# Patient Record
Sex: Female | Born: 1955 | Race: White | Hispanic: No | Marital: Married | Smoking: Former smoker
Health system: Southern US, Community
[De-identification: ages and names within clinical notes are randomized; demographics above are authoritative.]

## PROBLEM LIST (undated history)

## (undated) DIAGNOSIS — C3490 Malignant neoplasm of unspecified part of unspecified bronchus or lung: Secondary | ICD-10-CM

## (undated) DIAGNOSIS — J449 Chronic obstructive pulmonary disease, unspecified: Secondary | ICD-10-CM

## (undated) DIAGNOSIS — J910 Malignant pleural effusion: Secondary | ICD-10-CM

## (undated) DIAGNOSIS — J45909 Unspecified asthma, uncomplicated: Secondary | ICD-10-CM

## (undated) DIAGNOSIS — Z923 Personal history of irradiation: Secondary | ICD-10-CM

## (undated) DIAGNOSIS — F988 Other specified behavioral and emotional disorders with onset usually occurring in childhood and adolescence: Secondary | ICD-10-CM

## (undated) DIAGNOSIS — J189 Pneumonia, unspecified organism: Secondary | ICD-10-CM

## (undated) DIAGNOSIS — M1990 Unspecified osteoarthritis, unspecified site: Secondary | ICD-10-CM

## (undated) HISTORY — DX: Chronic obstructive pulmonary disease, unspecified: J44.9

## (undated) HISTORY — DX: Personal history of irradiation: Z92.3

## (undated) HISTORY — DX: Other specified behavioral and emotional disorders with onset usually occurring in childhood and adolescence: F98.8

## (undated) HISTORY — PX: SHOULDER ARTHROSCOPY: SHX128

## (undated) HISTORY — PX: BREAST SURGERY: SHX581

## (undated) HISTORY — PX: TOTAL ABDOMINAL HYSTERECTOMY: SHX209

## (undated) HISTORY — DX: Malignant neoplasm of unspecified part of unspecified bronchus or lung: C34.90

---

## 1998-10-22 ENCOUNTER — Ambulatory Visit (HOSPITAL_COMMUNITY): Admission: RE | Admit: 1998-10-22 | Discharge: 1998-10-22 | Payer: Self-pay | Admitting: Family Medicine

## 1999-01-27 ENCOUNTER — Encounter: Admission: RE | Admit: 1999-01-27 | Discharge: 1999-04-27 | Payer: Self-pay | Admitting: Critical Care Medicine

## 1999-05-25 ENCOUNTER — Encounter: Admission: RE | Admit: 1999-05-25 | Discharge: 1999-05-25 | Payer: Self-pay | Admitting: Family Medicine

## 1999-05-25 ENCOUNTER — Encounter: Payer: Self-pay | Admitting: Family Medicine

## 1999-11-09 ENCOUNTER — Other Ambulatory Visit: Admission: RE | Admit: 1999-11-09 | Discharge: 1999-11-09 | Payer: Self-pay | Admitting: Radiology

## 2000-01-07 ENCOUNTER — Encounter: Payer: Self-pay | Admitting: Family Medicine

## 2000-01-07 ENCOUNTER — Encounter: Admission: RE | Admit: 2000-01-07 | Discharge: 2000-01-07 | Payer: Self-pay | Admitting: Family Medicine

## 2001-04-14 ENCOUNTER — Encounter: Admission: RE | Admit: 2001-04-14 | Discharge: 2001-04-14 | Payer: Self-pay | Admitting: Family Medicine

## 2001-04-14 ENCOUNTER — Encounter: Payer: Self-pay | Admitting: Family Medicine

## 2001-06-07 HISTORY — PX: OTHER SURGICAL HISTORY: SHX169

## 2001-07-07 ENCOUNTER — Encounter: Payer: Self-pay | Admitting: Family Medicine

## 2001-07-07 ENCOUNTER — Encounter: Admission: RE | Admit: 2001-07-07 | Discharge: 2001-07-07 | Payer: Self-pay | Admitting: Family Medicine

## 2002-03-19 ENCOUNTER — Encounter: Payer: Self-pay | Admitting: General Surgery

## 2002-03-19 ENCOUNTER — Encounter: Admission: RE | Admit: 2002-03-19 | Discharge: 2002-03-19 | Payer: Self-pay | Admitting: General Surgery

## 2003-02-25 ENCOUNTER — Encounter: Payer: Self-pay | Admitting: Family Medicine

## 2003-02-25 ENCOUNTER — Ambulatory Visit (HOSPITAL_COMMUNITY): Admission: RE | Admit: 2003-02-25 | Discharge: 2003-02-25 | Payer: Self-pay | Admitting: Family Medicine

## 2003-02-26 ENCOUNTER — Other Ambulatory Visit: Admission: RE | Admit: 2003-02-26 | Discharge: 2003-02-26 | Payer: Self-pay | Admitting: Family Medicine

## 2003-03-07 ENCOUNTER — Ambulatory Visit (HOSPITAL_COMMUNITY): Admission: RE | Admit: 2003-03-07 | Discharge: 2003-03-07 | Payer: Self-pay | Admitting: Family Medicine

## 2003-03-07 ENCOUNTER — Encounter: Payer: Self-pay | Admitting: Family Medicine

## 2003-11-11 ENCOUNTER — Encounter: Admission: RE | Admit: 2003-11-11 | Discharge: 2003-11-11 | Payer: Self-pay | Admitting: Family Medicine

## 2012-07-19 ENCOUNTER — Ambulatory Visit (INDEPENDENT_AMBULATORY_CARE_PROVIDER_SITE_OTHER): Payer: BC Managed Care – PPO | Admitting: Internal Medicine

## 2012-07-19 ENCOUNTER — Encounter: Payer: Self-pay | Admitting: Internal Medicine

## 2012-07-19 ENCOUNTER — Ambulatory Visit (INDEPENDENT_AMBULATORY_CARE_PROVIDER_SITE_OTHER)
Admission: RE | Admit: 2012-07-19 | Discharge: 2012-07-19 | Disposition: A | Discharge status: Home / Self Care | Payer: Self-pay | Source: Ambulatory Visit | Attending: Internal Medicine | Admitting: Internal Medicine

## 2012-07-19 VITALS — BP 130/72 | HR 95 | Temp 97.4°F | Ht 63.0 in | Wt 135.0 lb

## 2012-07-19 DIAGNOSIS — R042 Hemoptysis: Secondary | ICD-10-CM

## 2012-07-19 DIAGNOSIS — R222 Localized swelling, mass and lump, trunk: Secondary | ICD-10-CM

## 2012-07-19 DIAGNOSIS — J441 Chronic obstructive pulmonary disease with (acute) exacerbation: Secondary | ICD-10-CM | POA: Insufficient documentation

## 2012-07-19 DIAGNOSIS — R918 Other nonspecific abnormal finding of lung field: Secondary | ICD-10-CM | POA: Insufficient documentation

## 2012-07-19 NOTE — Patient Instructions (Addendum)
Please remember to go to the  x-ray department downstairs for your tests - we will call you with the results when they are available.     Stop aspirin x 3 days if bleeding recurs and if persists  Or there are any abnormalities on chest xray  we'll need to schedule you for outpatient bronchosocopy at [facility]

## 2012-07-19 NOTE — Progress Notes (Signed)
  Subjective:    Patient ID: Linda Donovan, female    DOB: 1956/06/07  MRN: [id number]  HPI  Primary Linda Donovan  57 yowf quit smoking 1998 with sob that improved to point were stopped all medications x rare  saba  self referred 07/19/2012 to pulmonary clinic for hemoptysis.   07/19/2012 1st pulmonary eval in EMR era/ [facility] cc  Sob with inclines and cold weather and steps and no increase in saba indolent onset rattling cough and throat congestion onset mid Jan 2014 with white phlegm streaked with blood and sev times a week esp in am's and no epistaxis. No more blood x 5 days, ? Back of throat.   No obvious pattern to  variabilty or assoc sorethroat cp or chest tightness, subjective wheeze overt sinus or hb symptoms. No unusual exp hx or h/o childhood pna/ asthma or premature birth to his knowledge.    Review of Systems  Constitutional: Negative for fever and unexpected weight change.  HENT: Positive for congestion and postnasal drip. Negative for ear pain, nosebleeds, sore throat, rhinorrhea, sneezing, trouble swallowing, dental problem and sinus pressure.   Eyes: Negative for redness and itching.  Respiratory: Positive for cough and shortness of breath. Negative for chest tightness and wheezing.   Cardiovascular: Negative for palpitations and leg swelling.  Gastrointestinal: Negative for nausea and vomiting.  Genitourinary: Negative for dysuria.  Musculoskeletal: Negative for joint swelling.  Skin: Negative for rash.  Neurological: Negative for headaches.  Hematological: Does not bruise/bleed easily.  Psychiatric/Behavioral: Negative for dysphoric mood. The patient is not nervous/anxious.        Objective:   Physical Exam Wt Readings from Last 3 Encounters:  07/19/12 135 lb (61.236 kg)    HEENT mild turbinate edema.  Oropharynx no thrush or excess pnd or cobblestoning.  No JVD or cervical adenopathy. Mild accessory muscle hypertrophy. Trachea midline, nl thryroid. Chest was  hyperinflated by percussion with diminished breath sounds and moderate increased exp time without wheeze. Hoover sign positive at mid inspiration. Regular rate and rhythm without murmur gallop or rub or increase P2 or edema.  Abd: no hsm, nl excursion. Ext warm without cyanosis or clubbing.    CXR  07/19/2012 : Moderate to severe COPD.  7.7 cm right upper lobe mass compatible with primary lung cancer.  Nodular density over left upper lobe could represent a calcified  granuloma or sclerotic area within the left second rib.         Assessment & Plan:

## 2012-07-19 NOTE — Assessment & Plan Note (Signed)
Assoc with low grade hemoptysis suggesting may have airway involvement  Discussed in detail all the  indications, usual  risks and alternatives  relative to the benefits with patient who agrees to proceed with bronchoscopy with biopsy when weather permits

## 2012-07-19 NOTE — Assessment & Plan Note (Signed)
-   07/19/2012 spirometry FEV1  0.89 (37%) ratio 38  GOLD III relatively well compensated on present rx

## 2012-07-24 ENCOUNTER — Encounter (HOSPITAL_COMMUNITY): Payer: Self-pay

## 2012-07-25 ENCOUNTER — Ambulatory Visit (HOSPITAL_COMMUNITY): Payer: BC Managed Care – PPO

## 2012-07-25 ENCOUNTER — Ambulatory Visit (HOSPITAL_COMMUNITY): Admit: 2012-07-25 | Payer: Self-pay | Admitting: Internal Medicine

## 2012-07-25 ENCOUNTER — Ambulatory Visit (HOSPITAL_COMMUNITY)
Admission: RE | Admit: 2012-07-25 | Discharge: 2012-07-25 | Disposition: A | Discharge status: Home / Self Care | Payer: BC Managed Care – PPO | Source: Ambulatory Visit | Attending: Internal Medicine | Admitting: Internal Medicine

## 2012-07-25 ENCOUNTER — Encounter (HOSPITAL_COMMUNITY): Payer: Self-pay

## 2012-07-25 ENCOUNTER — Encounter (HOSPITAL_COMMUNITY)
Admission: RE | Disposition: A | Discharge status: Home / Self Care | Payer: Self-pay | Source: Ambulatory Visit | Attending: Internal Medicine

## 2012-07-25 ENCOUNTER — Encounter (HOSPITAL_COMMUNITY): Payer: Self-pay | Admitting: Radiology

## 2012-07-25 DIAGNOSIS — R222 Localized swelling, mass and lump, trunk: Secondary | ICD-10-CM

## 2012-07-25 DIAGNOSIS — J4489 Other specified chronic obstructive pulmonary disease: Secondary | ICD-10-CM | POA: Insufficient documentation

## 2012-07-25 DIAGNOSIS — R918 Other nonspecific abnormal finding of lung field: Secondary | ICD-10-CM

## 2012-07-25 DIAGNOSIS — J449 Chronic obstructive pulmonary disease, unspecified: Secondary | ICD-10-CM | POA: Insufficient documentation

## 2012-07-25 DIAGNOSIS — R042 Hemoptysis: Secondary | ICD-10-CM | POA: Insufficient documentation

## 2012-07-25 DIAGNOSIS — R0602 Shortness of breath: Secondary | ICD-10-CM | POA: Insufficient documentation

## 2012-07-25 HISTORY — PX: VIDEO BRONCHOSCOPY: SHX5072

## 2012-07-25 SURGERY — BRONCHOSCOPY, WITH FLUOROSCOPY
Anesthesia: Moderate Sedation | Laterality: Bilateral

## 2012-07-25 MED ORDER — LIDOCAINE HCL 2 % EX GEL
CUTANEOUS | Status: DC | PRN
  Administered 2012-07-25: 2

## 2012-07-25 MED ORDER — MEPERIDINE HCL 25 MG/ML IJ SOLN
INTRAMUSCULAR | Status: DC | PRN
  Administered 2012-07-25 (×2): 25 mg via INTRAVENOUS

## 2012-07-25 MED ORDER — MEPERIDINE HCL 100 MG/ML IJ SOLN
INTRAMUSCULAR | Status: AC
  Filled 2012-07-25: qty 2

## 2012-07-25 MED ORDER — PHENYLEPHRINE HCL 0.25 % NA SOLN
NASAL | Status: DC | PRN
  Administered 2012-07-25: 2 via NASAL

## 2012-07-25 MED ORDER — MIDAZOLAM HCL 10 MG/2ML IJ SOLN
INTRAMUSCULAR | Status: DC | PRN
  Administered 2012-07-25 (×2): 2.5 mg via INTRAVENOUS

## 2012-07-25 MED ORDER — LIDOCAINE HCL 1 % IJ SOLN
INTRAMUSCULAR | Status: DC | PRN
  Administered 2012-07-25: 6 mL via RESPIRATORY_TRACT

## 2012-07-25 MED ORDER — MIDAZOLAM HCL 10 MG/2ML IJ SOLN
INTRAMUSCULAR | Status: AC
  Filled 2012-07-25: qty 4

## 2012-07-25 NOTE — H&P (Signed)
Primary Linda Donovan   57 yowf quit smoking 1998 with sob that improved to point were stopped all medications x rare  saba  self referred 07/19/2012 to pulmonary clinic for hemoptysis.     07/19/2012 1st pulmonary eval in EMR era/ [facility] cc  Sob with inclines and cold weather and steps and no increase in saba indolent onset rattling cough and throat congestion onset mid Jan 2014 with white phlegm streaked with blood and sev times a week esp in am's and no epistaxis. No more blood x 5 days, ? Back of throat. cxr showed RUL post mass > rec fob   07/25/2012 FOB/ no new complaints.    No obvious pattern to  variabilty or assoc sorethroat cp or chest tightness, subjective wheeze overt sinus or hb symptoms. No unusual exp hx or h/o childhood pna/ asthma or premature birth to his knowledge.      Review of Systems  Constitutional: Negative for fever and unexpected weight change.  HENT: Positive for congestion and postnasal drip. Negative for ear pain, nosebleeds, sore throat, rhinorrhea, sneezing, trouble swallowing, dental problem and sinus pressure.   Eyes: Negative for redness and itching.  Respiratory: Positive for cough and shortness of breath. Negative for chest tightness and wheezing.   Cardiovascular: Negative for palpitations and leg swelling.  Gastrointestinal: Negative for nausea and vomiting.  Genitourinary: Negative for dysuria.  Musculoskeletal: Negative for joint swelling.  Skin: Negative for rash.  Neurological: Negative for headaches.  Hematological: Does not bruise/bleed easily.  Psychiatric/Behavioral: Negative for dysphoric mood. The patient is not nervous/anxious.          Objective:     Physical Exam Wt Readings from Last 3 Encounters:   07/19/12  135 lb (61.236 kg)       HEENT mild turbinate edema.  Oropharynx no thrush or excess pnd or cobblestoning.  No JVD or cervical adenopathy. Mild accessory muscle hypertrophy. Trachea midline, nl thryroid. Chest was  hyperinflated by percussion with diminished breath sounds and moderate increased exp time without wheeze. Hoover sign positive at mid inspiration. Regular rate and rhythm without murmur gallop or rub or increase P2 or edema.  Abd: no hsm, nl excursion. Ext warm without cyanosis or clubbing.     CXR  07/19/2012 : Moderate to severe COPD.   7.7 cm right upper lobe mass compatible with primary lung cancer.   Nodular density over left upper lobe could represent a calcified   granuloma or sclerotic area within the left second rib.             Assessment & Plan:                 Revision History       Date/Time User Action    > 07/19/2012  2:07 PM Nyoka Cowden, MD Signed      07/19/2012  9:00 AM Darrell Jewel, CMA Sign at close encounter              COPD  GOLD III - Nyoka Cowden, MD at 07/19/2012  1:57 PM    Status: Written Related Problem: COPD  GOLD III           - 07/19/2012 spirometry FEV1  0.89 (37%) ratio 38   GOLD III relatively well compensated on present rx         Right lower lobe lung mass - Nyoka Cowden, MD at 07/19/2012  2:00 PM    Status: Written Related Problem: Right lower  lobe lung mass           Assoc with low grade hemoptysis suggesting may have airway involvement   Discussed in detail all the  indications, usual  risks and alternatives  relative to the benefits with patient who agrees to proceed with bronchoscopy with biopsy  07/25/2012         Sandrea Hughs, MD Pulmonary and Critical Care Medicine [facility] Cell [phone] After 5:30 PM or weekends, call [phone]

## 2012-07-25 NOTE — Op Note (Signed)
Bronchoscopy Procedure Note  Date of Operation: 11/05/2011  Pre-op Diagnosis: lung mass  Post-op Diagnosis: same  Surgeon: Sandrea Hughs  Anesthesia: Monitored Local Anesthesia with Sedation  Operation: Video Flexible fiberoptic bronchoscopy, diagnostic   Findings: Nl airways  Specimen: BAL RUL and RUL TBBX  Estimated Blood Loss: Min   Complications: None  Indications and History: See updated H and P same date. The risks, benefits, complications, treatment options and expected outcomes were discussed with the patient.  The possibilities of reaction to medication, pulmonary aspiration, perforation of a viscus, bleeding, failure to diagnose a condition and creating a complication requiring transfusion or operation were discussed with the patient who freely signed the consent.    Description of Procedure: The patient was re-examined in the bronchoscopy suite and the site of surgery properly noted/marked.  The patient was identified  and the procedure verified as Flexible Video Fiberoptic Bronchoscopy.  A Time Out was held and the above information confirmed.   After the induction of topical nasopharyngeal anesthesia, the patient was positioned  and the bronchoscope was passed through the R naris. The vocal cords were visualized and  1% buffered lidocaine 5 ml was topically placed onto the cords. The cords were Nl. The scope was then passed into the trachea.  1% buffered lidocaine given topically. Airways inspected bilaterally to the subsegmental level with the following findings:  1) Min blood, no focal source or mucosal abnormalities 2) Min extrinsic  swelling RUL orifice  Procedure 1) BAL RUL 2) Tbbx RUL x 4 fluoroscope guided    The Patient was taken to the Endoscopy Recovery area in satisfactory condition.  Attestation: I performed the procedure.  Sandrea Hughs, MD Pulmonary and Critical Care Medicine [facility] Cell [phone] After 5:30 PM or weekends, call  [phone]

## 2012-07-26 ENCOUNTER — Encounter (HOSPITAL_COMMUNITY): Payer: Self-pay | Admitting: Internal Medicine

## 2012-07-27 ENCOUNTER — Telehealth: Payer: Self-pay | Admitting: Internal Medicine

## 2012-07-27 DIAGNOSIS — R918 Other nonspecific abnormal finding of lung field: Secondary | ICD-10-CM

## 2012-07-27 NOTE — Telephone Encounter (Signed)
Discussed with pt, next step is pet, ordered

## 2012-07-27 NOTE — Telephone Encounter (Signed)
I spoke with pt and she is requesting her BX results. I advised pt that I would send message over to [facility] for when he returns to the office Friday morning. Please advise MW thanks

## 2012-07-28 NOTE — Telephone Encounter (Signed)
Pet 08/04/12@7 :15am Tobe Sos

## 2012-08-02 ENCOUNTER — Telehealth: Payer: Self-pay | Admitting: Internal Medicine

## 2012-08-02 NOTE — Telephone Encounter (Signed)
Pt had a few questions about what the PET scan was all about. This test is going to cost her $5200 and she wanted to know if it would be worth the money. I advised her what a PET scan was and that if MW didn't think it was necessary he would not have suggested it. She understood and she is going to go through with having the scan done.   She will call back if she has any other questions.

## 2012-08-04 ENCOUNTER — Ambulatory Visit (HOSPITAL_COMMUNITY)
Admission: RE | Admit: 2012-08-04 | Discharge: 2012-08-04 | Disposition: A | Discharge status: Home / Self Care | Payer: BC Managed Care – PPO | Source: Ambulatory Visit | Attending: Internal Medicine | Admitting: Internal Medicine

## 2012-08-04 ENCOUNTER — Encounter (HOSPITAL_COMMUNITY): Payer: Self-pay

## 2012-08-04 ENCOUNTER — Encounter: Payer: Self-pay | Admitting: Internal Medicine

## 2012-08-04 DIAGNOSIS — R918 Other nonspecific abnormal finding of lung field: Secondary | ICD-10-CM

## 2012-08-04 DIAGNOSIS — K8020 Calculus of gallbladder without cholecystitis without obstruction: Secondary | ICD-10-CM | POA: Insufficient documentation

## 2012-08-04 DIAGNOSIS — K5730 Diverticulosis of large intestine without perforation or abscess without bleeding: Secondary | ICD-10-CM | POA: Insufficient documentation

## 2012-08-04 DIAGNOSIS — J438 Other emphysema: Secondary | ICD-10-CM | POA: Insufficient documentation

## 2012-08-04 DIAGNOSIS — I708 Atherosclerosis of other arteries: Secondary | ICD-10-CM | POA: Insufficient documentation

## 2012-08-04 DIAGNOSIS — R222 Localized swelling, mass and lump, trunk: Secondary | ICD-10-CM | POA: Insufficient documentation

## 2012-08-04 DIAGNOSIS — C7800 Secondary malignant neoplasm of unspecified lung: Secondary | ICD-10-CM | POA: Insufficient documentation

## 2012-08-04 DIAGNOSIS — I700 Atherosclerosis of aorta: Secondary | ICD-10-CM | POA: Insufficient documentation

## 2012-08-04 MED ORDER — FLUDEOXYGLUCOSE F - 18 (FDG) INJECTION
15.9000 | Freq: Once | INTRAVENOUS | Status: AC | PRN
  Administered 2012-08-04: 15.9 via INTRAVENOUS

## 2012-08-09 ENCOUNTER — Telehealth: Payer: Self-pay | Admitting: Emergency Medicine

## 2012-08-09 NOTE — Telephone Encounter (Signed)
Message copied by Leslye Peer on Wed Aug 09, 2012  5:16 PM ------      Message from: Sandrea Hughs B      Created: Fri Aug 04, 2012  1:06 PM       I missed the dx on fob (I did the fob first because she was hemoptysizing)  but she has a large subcarinal node on PET ? ebus candidate.             I told her today (08/04/2012)  you would be in touch if so to arrange it some time in the next few weeks            Thanks  ------

## 2012-08-09 NOTE — Telephone Encounter (Signed)
I need to see this pt to work on setting up EBUS and FOB in the OR. Can be a 15 minute appt.

## 2012-08-10 NOTE — Telephone Encounter (Signed)
Spoke with patient, she is scheduled to come in to office 08/11/12 @415 . Nothing further needed at this time.

## 2012-08-11 ENCOUNTER — Ambulatory Visit: Payer: BC Managed Care – PPO | Admitting: Emergency Medicine

## 2012-08-14 ENCOUNTER — Telehealth: Payer: Self-pay | Admitting: Internal Medicine

## 2012-08-14 NOTE — Telephone Encounter (Signed)
Lauren please advise where pt cna be worked in thanks

## 2012-08-15 NOTE — Telephone Encounter (Signed)
Pt called back again. Linda Donovan °

## 2012-08-15 NOTE — Telephone Encounter (Signed)
This encounter closed in error, the patient does not need office visit with dr byrum, she is dr werts patient and is awaiting a phone call from dr wert regarding a procedure he wants dr byrum to do. I will forward to dr wert for f/u

## 2012-08-15 NOTE — Telephone Encounter (Signed)
ATC patient, no answer [id number] 

## 2012-08-15 NOTE — Telephone Encounter (Signed)
Pt returned call. Linda Donovan  

## 2012-08-16 NOTE — Telephone Encounter (Signed)
I spoke with Linda Donovan. She is scheduled to see RB on 08/25/12 at 2:00. Will forward to MW so he is aware. Nothing further was needed

## 2012-08-16 NOTE — Telephone Encounter (Addendum)
Please see if we can get her into to see Byrum asap as she misunderstood my rec to see Byrum for procedure - she must see him in office first

## 2012-08-25 ENCOUNTER — Encounter: Payer: Self-pay | Admitting: Emergency Medicine

## 2012-08-25 ENCOUNTER — Ambulatory Visit (INDEPENDENT_AMBULATORY_CARE_PROVIDER_SITE_OTHER): Payer: BC Managed Care – PPO | Admitting: Emergency Medicine

## 2012-08-25 VITALS — BP 140/80 | HR 105 | Temp 98.0°F | Ht 63.0 in | Wt 136.0 lb

## 2012-08-25 DIAGNOSIS — J310 Chronic rhinitis: Secondary | ICD-10-CM | POA: Insufficient documentation

## 2012-08-25 DIAGNOSIS — R222 Localized swelling, mass and lump, trunk: Secondary | ICD-10-CM

## 2012-08-25 DIAGNOSIS — R918 Other nonspecific abnormal finding of lung field: Secondary | ICD-10-CM

## 2012-08-25 NOTE — Assessment & Plan Note (Signed)
Trial loratadine and nasal steroid

## 2012-08-25 NOTE — Assessment & Plan Note (Signed)
Will schedule EBUS and FOB asap

## 2012-08-25 NOTE — Progress Notes (Signed)
Subjective:    Patient ID: Linda Donovan, female    DOB: 1955-12-06  MRN: [id number] HPI Primary [name]  57 yowf quit smoking 1998 with sob that improved to point were stopped all medications x rare  saba  self referred to pulmonary clinic for hemoptysis.  1st pulmonary eval in EMR era/ [facility] cc  Sob with inclines and cold weather and steps and no increase in saba indolent onset rattling cough and throat congestion onset mid Jan 2014 with white phlegm streaked with blood and sev times a week esp in am's and no epistaxis. No more blood x 5 days, ? Back of throat.   No obvious pattern to  variabilty or assoc sorethroat cp or chest tightness, subjective wheeze overt sinus or hb symptoms. No unusual exp hx or h/o childhood pna/ asthma or premature birth to his knowledge.   ROV 08/25/12 -- 57 yo former smoker, seen by Dr Sherene Sires for hemoptysis. 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 S/p FOB that was non-diagnostic. She is referred after PET scan to consider another bx. Her hemoptysis has resolved.  She is c/o nasal gtt.    Review of Systems  Constitutional: Negative for fever and unexpected weight change.  HENT: Positive for congestion and postnasal drip. Negative for ear pain, nosebleeds, sore throat, rhinorrhea, sneezing, trouble swallowing, dental problem and sinus pressure.   Eyes: Negative for redness and itching.  Respiratory: Positive for cough and shortness of breath. Negative for chest tightness and wheezing.   Cardiovascular: Negative for palpitations and leg swelling.  Gastrointestinal: Negative for nausea and vomiting.  Genitourinary: Negative for dysuria.  Musculoskeletal: Negative for joint swelling.  Skin: Negative for rash.  Neurological: Negative for headaches.  Hematological: Does not bruise/bleed easily.  Psychiatric/Behavioral: Negative for dysphoric mood. The patient is not nervous/anxious.     Past Medical History  Diagnosis Date  . COPD  (chronic obstructive pulmonary disease)   . ADD (attention deficit disorder)      Family History  Problem Relation Age of Onset  . COPD Paternal Uncle   . Heart disease Mother   . Heart disease Maternal Grandmother   . Cancer Paternal Grandmother   . Prostate cancer Maternal Grandfather      History   Social History  . Marital Status: Married    Spouse Name: N/A    Number of Children: N/A  . Years of Education: N/A   Occupational History  . Not on file.   Social History Main Topics  . Smoking status: Former Smoker -- 1.00 packs/day for 20 years    Types: Cigarettes    Quit date: 06/07/1996  . Smokeless tobacco: Not on file  . Alcohol Use: No  . Drug Use: No  . Sexually Active: Not on file   Other Topics Concern  . Not on file   Social History Narrative  . No narrative on file     Allergies  Allergen Reactions  . Clinoril (Sulindac)   . Morphine And Related     itch     Outpatient Prescriptions Prior to Visit  Medication Sig Dispense Refill  . albuterol (PROVENTIL HFA;VENTOLIN HFA) 108 (90 BASE) MCG/ACT inhaler Inhale 2 puffs into the lungs every 6 (six) hours as needed for wheezing.      Marland Kitchen amphetamine-dextroamphetamine (ADDERALL) 20 MG tablet Take 20 mg by mouth daily.      Marland Kitchen aspirin 81 MG tablet Take 81 mg by mouth daily.      Marland Kitchen  Multiple Vitamins-Minerals (MULTIVITAMIN WITH MINERALS) tablet Take 1 tablet by mouth daily.      . traZODone (DESYREL) 50 MG tablet Take 50 mg by mouth at bedtime.       No facility-administered medications prior to visit.         Objective:   Physical Exam Wt Readings from Last 3 Encounters:  08/25/12 61.689 kg (136 lb)  07/25/12 61.236 kg (135 lb)  07/24/12 61.236 kg (135 lb)    HEENT mild turbinate edema.  Oropharynx no thrush or excess pnd or cobblestoning.  No JVD or cervical adenopathy. Mild accessory muscle hypertrophy. Trachea midline, nl thryroid. Chest was hyperinflated by percussion with diminished breath sounds  and moderate increased exp time without wheeze. Hoover sign positive at mid inspiration. Regular rate and rhythm without murmur gallop or rub or increase P2 or edema.  Abd: no hsm, nl excursion. Ext warm without cyanosis or clubbing.    CXR  Moderate to severe COPD.  7.7 cm right upper lobe mass compatible with primary lung cancer.  Nodular density over left upper lobe could represent a calcified  granuloma or sclerotic area within the left second rib.   PET scan 08/04/12:  IMPRESSION:  1. Large hypermetabolic right upper lobe mass with associated  subcarinal nodal metastasis. 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  2. Severe emphysema with patchy right upper lobe infiltrate  anteriorly.  3. Cholelithiasis.      Assessment & Plan:  Mass of lung Will schedule EBUS and FOB asap  Rhinitis Trial loratadine and nasal steroid

## 2012-08-25 NOTE — Patient Instructions (Addendum)
We will arrange for a repeat bronchoscopy with ultrasound as soon as possible  Try starting loratadine 10mg  daily Start 2 sprays nasonex each nostril daily Follow with Dr Delton Coombes in 1 month

## 2012-08-28 ENCOUNTER — Encounter (HOSPITAL_COMMUNITY): Payer: Self-pay | Admitting: Pharmacy Technician

## 2012-08-28 ENCOUNTER — Encounter (HOSPITAL_COMMUNITY): Payer: Self-pay | Admitting: *Deleted

## 2012-08-30 ENCOUNTER — Telehealth: Payer: Self-pay | Admitting: Emergency Medicine

## 2012-08-30 NOTE — Telephone Encounter (Signed)
Spoke with Linda Donovan She states that she can not have repeat bronch  She states that [employer] would not pay for the first one at all She is upset, stating that she has went through a third of her yearly income and still does not know what her dx is RB, please advise, thanks

## 2012-08-31 NOTE — Telephone Encounter (Signed)
Pt has a $5500 deductable which she has met, she also has $11000 out of pocket expense once the EBUS is done pt will have to pay 30% of the cost of the EBUS pt states she just can not afford this anymore, she wants to know what it is and to be treated but the expense has become too much, i explained to her the EBUS was designed to get the dx and she daid she wants to talk to you before Monday about the procedure thanks libby

## 2012-08-31 NOTE — Telephone Encounter (Signed)
This needs to be addressed by Linda Donovan today - Is it possible that her FOB/EBUS will not be covered by [employer]?? She is scheduled for EBUS on Monday. Please call her to discuss so she can decide if we are going to proceed. Let me know what you find out. RB

## 2012-08-31 NOTE — Telephone Encounter (Signed)
lmtcb Linda Donovan ° °

## 2012-09-01 NOTE — Telephone Encounter (Signed)
Discussed with the patient. Explained the reasoning behind doing the FOB/EBUS. She understands and agrees to proceed.  Levy Pupa, MD, PhD 09/01/2012, 3:50 PM [facility] [phone] or if no answer [phone]

## 2012-09-04 ENCOUNTER — Encounter (HOSPITAL_COMMUNITY): Payer: Self-pay | Admitting: *Deleted

## 2012-09-04 ENCOUNTER — Encounter (HOSPITAL_COMMUNITY)
Admission: RE | Disposition: A | Discharge status: Home / Self Care | Payer: Self-pay | Source: Ambulatory Visit | Attending: Emergency Medicine

## 2012-09-04 ENCOUNTER — Ambulatory Visit (HOSPITAL_COMMUNITY)
Admission: RE | Admit: 2012-09-04 | Discharge: 2012-09-04 | Disposition: A | Discharge status: Home / Self Care | Payer: BC Managed Care – PPO | Source: Ambulatory Visit | Attending: Emergency Medicine | Admitting: Emergency Medicine

## 2012-09-04 ENCOUNTER — Ambulatory Visit (HOSPITAL_COMMUNITY): Payer: BC Managed Care – PPO | Admitting: Anesthesiology

## 2012-09-04 ENCOUNTER — Encounter (HOSPITAL_COMMUNITY): Payer: Self-pay | Admitting: Anesthesiology

## 2012-09-04 DIAGNOSIS — Z888 Allergy status to other drugs, medicaments and biological substances status: Secondary | ICD-10-CM | POA: Insufficient documentation

## 2012-09-04 DIAGNOSIS — F988 Other specified behavioral and emotional disorders with onset usually occurring in childhood and adolescence: Secondary | ICD-10-CM | POA: Insufficient documentation

## 2012-09-04 DIAGNOSIS — C771 Secondary and unspecified malignant neoplasm of intrathoracic lymph nodes: Secondary | ICD-10-CM | POA: Insufficient documentation

## 2012-09-04 DIAGNOSIS — J4489 Other specified chronic obstructive pulmonary disease: Secondary | ICD-10-CM | POA: Insufficient documentation

## 2012-09-04 DIAGNOSIS — C801 Malignant (primary) neoplasm, unspecified: Secondary | ICD-10-CM | POA: Insufficient documentation

## 2012-09-04 DIAGNOSIS — R042 Hemoptysis: Secondary | ICD-10-CM | POA: Insufficient documentation

## 2012-09-04 DIAGNOSIS — Z79899 Other long term (current) drug therapy: Secondary | ICD-10-CM | POA: Insufficient documentation

## 2012-09-04 DIAGNOSIS — Z885 Allergy status to narcotic agent status: Secondary | ICD-10-CM | POA: Insufficient documentation

## 2012-09-04 DIAGNOSIS — R918 Other nonspecific abnormal finding of lung field: Secondary | ICD-10-CM

## 2012-09-04 DIAGNOSIS — Z87891 Personal history of nicotine dependence: Secondary | ICD-10-CM | POA: Insufficient documentation

## 2012-09-04 DIAGNOSIS — R222 Localized swelling, mass and lump, trunk: Secondary | ICD-10-CM

## 2012-09-04 DIAGNOSIS — J449 Chronic obstructive pulmonary disease, unspecified: Secondary | ICD-10-CM | POA: Insufficient documentation

## 2012-09-04 DIAGNOSIS — Z7982 Long term (current) use of aspirin: Secondary | ICD-10-CM | POA: Insufficient documentation

## 2012-09-04 HISTORY — PX: ENDOBRONCHIAL ULTRASOUND: SHX5096

## 2012-09-04 LAB — COMPREHENSIVE METABOLIC PANEL
Alkaline Phosphatase: 90 U/L (ref 39–117)
BUN: 6 mg/dL (ref 6–23)
CO2: 28 mEq/L (ref 19–32)
Chloride: 100 mEq/L (ref 96–112)
GFR calc Af Amer: >90 mL/min (ref >90)
GFR calc non Af Amer: >90 mL/min (ref >90)
Glucose, Bld: 104 mg/dL — ABNORMAL HIGH (ref 70–99)
Potassium: 3.4 mEq/L — ABNORMAL LOW (ref 3.5–5.1)
Total Bilirubin: 0.6 mg/dL (ref 0.3–1.2)
Total Protein: 7.5 g/dL (ref 6.0–8.3)

## 2012-09-04 LAB — CBC
HCT: 48.1 % — ABNORMAL HIGH (ref 36.0–46.0)
Hemoglobin: 16.5 g/dL — ABNORMAL HIGH (ref 12.0–15.0)
MCHC: 34.3 g/dL (ref 30.0–36.0)

## 2012-09-04 LAB — PROTIME-INR: Prothrombin Time: 12 seconds (ref 11.6–15.2)

## 2012-09-04 LAB — APTT: aPTT: 32 seconds (ref 24–37)

## 2012-09-04 SURGERY — ENDOBRONCHIAL ULTRASOUND (EBUS)
Anesthesia: General | Laterality: Bilateral | Wound class: Clean Contaminated

## 2012-09-04 MED ORDER — PHENYLEPHRINE HCL 10 MG/ML IJ SOLN
INTRAMUSCULAR | Status: DC | PRN
  Administered 2012-09-04 (×2): 80 ug via INTRAVENOUS

## 2012-09-04 MED ORDER — LIDOCAINE HCL 2 % EX GEL: Freq: Once | CUTANEOUS | Status: DC

## 2012-09-04 MED ORDER — LACTATED RINGERS IV SOLN
INTRAVENOUS | Status: DC
  Administered 2012-09-04: 1000 mL via INTRAVENOUS

## 2012-09-04 MED ORDER — MEPERIDINE HCL 100 MG/ML IJ SOLN: 6.2500 mg | INTRAMUSCULAR | Status: DC | PRN

## 2012-09-04 MED ORDER — MEPERIDINE HCL 100 MG/ML IJ SOLN: 100.0000 mg | Freq: Once | INTRAMUSCULAR | Status: DC

## 2012-09-04 MED ORDER — SUCCINYLCHOLINE CHLORIDE 20 MG/ML IJ SOLN
INTRAMUSCULAR | Status: DC | PRN
  Administered 2012-09-04: 60 mg via INTRAVENOUS

## 2012-09-04 MED ORDER — PHENYLEPHRINE HCL 0.25 % NA SOLN: 1.0000 | Freq: Four times a day (QID) | NASAL | Status: DC | PRN

## 2012-09-04 MED ORDER — PROPOFOL 10 MG/ML IV BOLUS
INTRAVENOUS | Status: DC | PRN
  Administered 2012-09-04: 150 mg via INTRAVENOUS

## 2012-09-04 MED ORDER — SODIUM CHLORIDE 0.9 % IV SOLN: INTRAVENOUS | Status: DC

## 2012-09-04 MED ORDER — MIDAZOLAM HCL 5 MG/ML IJ SOLN: 1.0000 mg | Freq: Once | INTRAMUSCULAR | Status: DC

## 2012-09-04 MED ORDER — SODIUM CHLORIDE 0.9 % IV SOLN
INTRAVENOUS | Status: DC | PRN
  Administered 2012-09-04: 11:00:00 via INTRAVENOUS

## 2012-09-04 MED ORDER — EPHEDRINE SULFATE 50 MG/ML IJ SOLN
INTRAMUSCULAR | Status: DC | PRN
  Administered 2012-09-04 (×2): 10 mg via INTRAVENOUS

## 2012-09-04 MED ORDER — LIDOCAINE HCL (CARDIAC) 20 MG/ML IV SOLN
INTRAVENOUS | Status: DC | PRN
  Administered 2012-09-04: 100 mg via INTRAVENOUS

## 2012-09-04 MED ORDER — PROMETHAZINE HCL 25 MG/ML IJ SOLN: 6.2500 mg | INTRAMUSCULAR | Status: DC | PRN

## 2012-09-04 MED ORDER — LACTATED RINGERS IV SOLN: INTRAVENOUS | Status: DC

## 2012-09-04 MED ORDER — SODIUM CHLORIDE 0.9 % IJ SOLN
1000.0000 ug | INTRAVENOUS | Status: DC | PRN
  Administered 2012-09-04: .375 ug/kg/min via INTRAVENOUS

## 2012-09-04 NOTE — Anesthesia Preprocedure Evaluation (Addendum)
Anesthesia Evaluation  Patient identified by MRN, date of birth, ID band Patient awake    Reviewed: Allergy & Precautions, H&P , NPO status , Patient's Chart, lab work & pertinent test results  Airway Mallampati: II TM Distance: >3 FB Neck ROM: Full    Dental no notable dental hx. (+) Partial Lower   Pulmonary COPDformer smoker,  breath sounds clear to auscultation  Pulmonary exam normal       Cardiovascular negative cardio ROS  Rhythm:Regular Rate:Normal     Neuro/Psych negative neurological ROS  negative psych ROS   GI/Hepatic negative GI ROS, Neg liver ROS,   Endo/Other  negative endocrine ROS  Renal/GU negative Renal ROS  negative genitourinary   Musculoskeletal negative musculoskeletal ROS (+)   Abdominal   Peds negative pediatric ROS (+)  Hematology negative hematology ROS (+)   Anesthesia Other Findings   Reproductive/Obstetrics negative OB ROS                          Anesthesia Physical Anesthesia Plan  ASA: III  Anesthesia Plan: General   Post-op Pain Management:    Induction: Intravenous  Airway Management Planned: Oral ETT  Additional Equipment:   Intra-op Plan:   Post-operative Plan: Extubation in OR  Informed Consent: I have reviewed the patients History and Physical, chart, labs and discussed the procedure including the risks, benefits and alternatives for the proposed anesthesia with the patient or authorized representative who has indicated his/her understanding and acceptance.   Dental advisory given  Plan Discussed with: CRNA  Anesthesia Plan Comments:         Anesthesia Quick Evaluation

## 2012-09-04 NOTE — H&P (View-Only) (Signed)
Subjective:    Patient ID: Linda Donovan, female    DOB: 09/22/1955  MRN: [id number] HPI Primary Jeniffer Turnbull  57 yowf quit smoking 1998 with sob that improved to point were stopped all medications x rare  saba  self referred to pulmonary clinic for hemoptysis.  1st pulmonary eval in EMR era/ Wert cc  Sob with inclines and cold weather and steps and no increase in saba indolent onset rattling cough and throat congestion onset mid Jan 2014 with white phlegm streaked with blood and sev times a week esp in am's and no epistaxis. No more blood x 5 days, ? Back of throat.   No obvious pattern to  variabilty or assoc sorethroat cp or chest tightness, subjective wheeze overt sinus or hb symptoms. No unusual exp hx or h/o childhood pna/ asthma or premature birth to his knowledge.   ROV 08/25/12 -- 57 yo former smoker, seen by Dr Wert for hemoptysis. 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 S/p FOB that was non-diagnostic. She is referred after PET scan to consider another bx. Her hemoptysis has resolved.  She is c/o nasal gtt.    Review of Systems  Constitutional: Negative for fever and unexpected weight change.  HENT: Positive for congestion and postnasal drip. Negative for ear pain, nosebleeds, sore throat, rhinorrhea, sneezing, trouble swallowing, dental problem and sinus pressure.   Eyes: Negative for redness and itching.  Respiratory: Positive for cough and shortness of breath. Negative for chest tightness and wheezing.   Cardiovascular: Negative for palpitations and leg swelling.  Gastrointestinal: Negative for nausea and vomiting.  Genitourinary: Negative for dysuria.  Musculoskeletal: Negative for joint swelling.  Skin: Negative for rash.  Neurological: Negative for headaches.  Hematological: Does not bruise/bleed easily.  Psychiatric/Behavioral: Negative for dysphoric mood. The patient is not nervous/anxious.     Past Medical History  Diagnosis Date  . COPD  (chronic obstructive pulmonary disease)   . ADD (attention deficit disorder)      Family History  Problem Relation Age of Onset  . COPD Paternal Uncle   . Heart disease Mother   . Heart disease Maternal Grandmother   . Cancer Paternal Grandmother   . Prostate cancer Maternal Grandfather      History   Social History  . Marital Status: Married    Spouse Name: N/A    Number of Children: N/A  . Years of Education: N/A   Occupational History  . Not on file.   Social History Main Topics  . Smoking status: Former Smoker -- 1.00 packs/day for 20 years    Types: Cigarettes    Quit date: 06/07/1996  . Smokeless tobacco: Not on file  . Alcohol Use: No  . Drug Use: No  . Sexually Active: Not on file   Other Topics Concern  . Not on file   Social History Narrative  . No narrative on file     Allergies  Allergen Reactions  . Clinoril (Sulindac)   . Morphine And Related     itch     Outpatient Prescriptions Prior to Visit  Medication Sig Dispense Refill  . albuterol (PROVENTIL HFA;VENTOLIN HFA) 108 (90 BASE) MCG/ACT inhaler Inhale 2 puffs into the lungs every 6 (six) hours as needed for wheezing.      . amphetamine-dextroamphetamine (ADDERALL) 20 MG tablet Take 20 mg by mouth daily.      . aspirin 81 MG tablet Take 81 mg by mouth daily.      .   Multiple Vitamins-Minerals (MULTIVITAMIN WITH MINERALS) tablet Take 1 tablet by mouth daily.      . traZODone (DESYREL) 50 MG tablet Take 50 mg by mouth at bedtime.       No facility-administered medications prior to visit.         Objective:   Physical Exam Wt Readings from Last 3 Encounters:  08/25/12 61.689 kg (136 lb)  07/25/12 61.236 kg (135 lb)  07/24/12 61.236 kg (135 lb)    HEENT mild turbinate edema.  Oropharynx no thrush or excess pnd or cobblestoning.  No JVD or cervical adenopathy. Mild accessory muscle hypertrophy. Trachea midline, nl thryroid. Chest was hyperinflated by percussion with diminished breath sounds  and moderate increased exp time without wheeze. Hoover sign positive at mid inspiration. Regular rate and rhythm without murmur gallop or rub or increase P2 or edema.  Abd: no hsm, nl excursion. Ext warm without cyanosis or clubbing.    CXR  Moderate to severe COPD.  7.7 cm right upper lobe mass compatible with primary lung cancer.  Nodular density over left upper lobe could represent a calcified  granuloma or sclerotic area within the left second rib.   PET scan 08/04/12:  IMPRESSION:  1. Large hypermetabolic right upper lobe mass with associated  subcarinal nodal metastasis. 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  2. Severe emphysema with patchy right upper lobe infiltrate  anteriorly.  3. Cholelithiasis.      Assessment & Plan:  Mass of lung Will schedule EBUS and FOB asap  Rhinitis Trial loratadine and nasal steroid    

## 2012-09-04 NOTE — Preoperative (Signed)
Beta Blockers   Reason not to administer Beta Blockers:Not Applicable 

## 2012-09-04 NOTE — Transfer of Care (Signed)
Immediate Anesthesia Transfer of Care Note  Patient: Linda Donovan  Procedure(s) Performed: Procedure(s): ENDOBRONCHIAL ULTRASOUND (Bilateral)  Patient Location: PACU  Anesthesia Type:General  Level of Consciousness: awake, alert  and oriented  Airway & Oxygen Therapy: Patient Spontanous Breathing and Patient connected to face mask oxygen  Post-op Assessment: Report given to PACU RN and Post -op Vital signs reviewed and stable  Post vital signs: Reviewed and stable  Complications: No apparent anesthesia complications

## 2012-09-04 NOTE — OR Nursing (Signed)
Returned from PACU, IV removed, D/C instructions reviewed with pt and husband.

## 2012-09-04 NOTE — Op Note (Signed)
Video Bronchoscopy with Endobronchial Ultrasound Procedure Note  Date of Operation: 09/04/2012  Pre-op Diagnosis: RUL mass and mediastinal LAD  Post-op Diagnosis: same  Surgeon: Levy Pupa  Assistants: none  Anesthesia: General endotracheal anesthesia  Operation: Flexible video fiberoptic bronchoscopy with endobronchial ultrasound and biopsies.  Estimated Blood Loss: 10cc  Complications: none apparent  Indications and History: Linda Donovan is a 57 y.o. female with hx tobacco use. She was referred to Dr Sherene Sires for evaluation of a RUL mass and mediastinal LAD. Recommendation was made for bronchoscopy with endobronchial ultrasound to facilitate biopsies. The risks, benefits, complications, treatment options and expected outcomes were discussed with the patient.  The possibilities of pneumothorax, pneumonia, reaction to medication, pulmonary aspiration, perforation of a viscus, bleeding, failure to diagnose a condition and creating a complication requiring transfusion or operation were discussed with the patient who freely signed the consent.    Description of Procedure: The patient was examined in the preoperative area and history and data from the preprocedure consultation were reviewed. It was deemed appropriate to proceed.  The patient was taken to [facility], identified as Linda Donovan and the procedure verified as Flexible Video Fiberoptic Bronchoscopy.  A Time Out was held and the above information confirmed. General anesthesia was initiated and the patient  was orally intubated. The video fiberoptic bronchoscope was introduced via the endotracheal tube and a general inspection was performed which showed normal airways. Of note, no RUL endobronchial lesion could be identified. The standard scope was then withdrawn and the endobronchial ultrasound was used to identify and characterize the peritracheal, hilar and bronchial lymph nodes. Inspection showed a markedly enlarge station 7  node, as well as prominent station 10R and 11R nodes. Using real-time ultrasound guidance Wang needle biopsies were take from Station 7 and 10R nodes and were sent for cytology. The patient tolerated the procedure well without apparent complications. There was no significant blood loss. The bronchoscope was withdrawn. Anesthesia was reversed and the patient was taken to the PACU for recovery.   Samples: 1. Wang needle biopsies from 10R node 2. Wang needle biopsies from 7 node   Plans:  The procedure was well-tolerated. 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 The patient will be discharged from the PACU to home when recovered from anesthesia. We will review the final cytology results with the patient when they become available. Outpatient followup will be with Dr Delton Coombes and Dr Sherene Sires. If the dx of NSCLCA is confirmed then she will be referred to [facility] for further evaluation and therapy  Levy Pupa, MD, PhD 09/04/2012, 10:58 AM [facility] [phone] or if no answer [phone]

## 2012-09-04 NOTE — Anesthesia Procedure Notes (Signed)
Procedure Name: Intubation Date/Time: 09/04/2012 9:54 AM Performed by: Leroy Libman L Patient Re-evaluated:Patient Re-evaluated prior to inductionOxygen Delivery Method: Circle system utilized Preoxygenation: Pre-oxygenation with 100% oxygen Intubation Type: IV induction Ventilation: Mask ventilation without difficulty and Oral airway inserted - appropriate to patient size Laryngoscope Size: Hyacinth Meeker and 2 Grade View: Grade I Tube type: Oral Tube size: 9.0 mm Number of attempts: 1 Airway Equipment and Method: Stylet Placement Confirmation: ETT inserted through vocal cords under direct vision,  breath sounds checked- equal and bilateral and positive ETCO2 Secured at: 21 cm Tube secured with: Tape Dental Injury: Teeth and Oropharynx as per pre-operative assessment

## 2012-09-04 NOTE — Anesthesia Postprocedure Evaluation (Signed)
  Anesthesia Post-op Note  Patient: Linda Donovan  Procedure(s) Performed: Procedure(s) (LRB): ENDOBRONCHIAL ULTRASOUND (Bilateral)  Patient Location: PACU  Anesthesia Type: General  Level of Consciousness: awake and alert   Airway and Oxygen Therapy: Patient Spontanous Breathing  Post-op Pain: mild  Post-op Assessment: Post-op Vital signs reviewed, Patient's Cardiovascular Status Stable, Respiratory Function Stable, Patent Airway and No signs of Nausea or vomiting  Last Vitals:  Filed Vitals:   09/04/12 1120  BP: 138/66  Pulse: 108  Temp: 36.7 C  Resp: 20    Post-op Vital Signs: stable   Complications: No apparent anesthesia complications

## 2012-09-04 NOTE — Interval H&P Note (Signed)
PCCM Interval Note No new issues, no problems reported. Filed Vitals:   09/04/12 0832  BP: 162/87  Pulse: 88  Temp: 98 F (36.7 C)  Resp: 14    Recent Labs Lab 09/04/12 0815  HGB 16.5*  HCT 48.1*  WBC 9.6  PLT 351    Recent Labs Lab 09/04/12 0815  NA 138  K 3.4*  CL 100  CO2 28  GLUCOSE 104*  BUN 6  CREATININE 0.65  CALCIUM 9.3   Plan -- No barriers to proceeding. Plan FOB + EBUS.  Levy Pupa, MD, PhD 09/04/2012, 9:44 AM [facility] [phone] or if no answer [phone]

## 2012-09-05 ENCOUNTER — Telehealth: Payer: Self-pay | Admitting: Emergency Medicine

## 2012-09-05 ENCOUNTER — Encounter (HOSPITAL_COMMUNITY): Payer: Self-pay | Admitting: Emergency Medicine

## 2012-09-05 DIAGNOSIS — R918 Other nonspecific abnormal finding of lung field: Secondary | ICD-10-CM

## 2012-09-05 NOTE — Telephone Encounter (Signed)
Discussed path results with pt - shows adenoCA. Discussed prognosis and options. Will refer to [facility]. Will need MRI brain. Probably also needs full PFT, although her spiro 07/19/12 shows FEV1 0.89L (probably excludes her as a candidate for TCTS).

## 2012-09-06 ENCOUNTER — Telehealth: Payer: Self-pay | Admitting: *Deleted

## 2012-09-06 ENCOUNTER — Other Ambulatory Visit: Payer: Self-pay | Admitting: Internal Medicine

## 2012-09-06 NOTE — Telephone Encounter (Signed)
Spoke with pt regarding appt 09/12/12.  She is unable to make the am appt.  I will reschedule

## 2012-09-08 ENCOUNTER — Telehealth: Payer: Self-pay | Admitting: Internal Medicine

## 2012-09-08 NOTE — Telephone Encounter (Signed)
, °

## 2012-09-15 ENCOUNTER — Ambulatory Visit
Admission: RE | Admit: 2012-09-15 | Discharge: 2012-09-15 | Disposition: A | Discharge status: Home / Self Care | Payer: BC Managed Care – PPO | Source: Ambulatory Visit | Attending: Internal Medicine | Admitting: Internal Medicine

## 2012-09-15 MED ORDER — GADOBENATE DIMEGLUMINE 529 MG/ML IV SOLN
12.0000 mL | Freq: Once | INTRAVENOUS | Status: AC | PRN
  Administered 2012-09-15: 12 mL via INTRAVENOUS

## 2012-09-18 ENCOUNTER — Ambulatory Visit (HOSPITAL_BASED_OUTPATIENT_CLINIC_OR_DEPARTMENT_OTHER): Payer: BC Managed Care – PPO | Admitting: Internal Medicine

## 2012-09-18 ENCOUNTER — Encounter: Payer: Self-pay | Admitting: Internal Medicine

## 2012-09-18 ENCOUNTER — Telehealth: Payer: Self-pay | Admitting: Internal Medicine

## 2012-09-18 DIAGNOSIS — C3410 Malignant neoplasm of upper lobe, unspecified bronchus or lung: Secondary | ICD-10-CM

## 2012-09-18 DIAGNOSIS — C3491 Malignant neoplasm of unspecified part of right bronchus or lung: Secondary | ICD-10-CM

## 2012-09-18 DIAGNOSIS — Z87891 Personal history of nicotine dependence: Secondary | ICD-10-CM

## 2012-09-18 DIAGNOSIS — R599 Enlarged lymph nodes, unspecified: Secondary | ICD-10-CM

## 2012-09-18 DIAGNOSIS — C3490 Malignant neoplasm of unspecified part of unspecified bronchus or lung: Secondary | ICD-10-CM

## 2012-09-18 HISTORY — DX: Malignant neoplasm of unspecified part of unspecified bronchus or lung: C34.90

## 2012-09-18 MED ORDER — PROCHLORPERAZINE MALEATE 10 MG PO TABS: 10.0000 mg | ORAL_TABLET | Freq: Four times a day (QID) | ORAL | Status: DC | PRN

## 2012-09-18 NOTE — Progress Notes (Signed)
[facility] [phone]   [phone]  CONSULT NOTE  REFERRING PHYSICIAN: Dr. Levy Pupa  REASON FOR CONSULTATION:  57 years old white female recently diagnosed with lung cancer.  HPI Linda Donovan is a 57 y.o. female was past medical history significant for COPD and attention deficit disorder as well as remote history of smoking but quit more than 26 years ago. The patient mentions that she had clotting of the blood happen twice one in December of 2013, and the other one was in February 2013. She requested an appointment with her pulmonologist Dr. Delford Field but he was seen by Dr. Sherene Sires in his absence. Chest x-ray on 07/19/2012 showed moderate to severe COPD but there was 7.7 CM right upper lobe mass compatible with primary lung cancer. On 07/25/2012 the patient underwent video flexible fiberoptic bronchoscopy under the care of Dr. Sherene Sires but it was nondiagnostic. A PET scan was performed on 08/04/2012 and it showed 7.9 x 5.1 CM spiculated right upper lobe mass was noted hypermetabolic activity and SUV max of 16.6 with associated postobstructive pneumonitis. There was evidence of a dominant hypermetabolic subcarinal nodal mass which measured 2.6 x 3.3 CM with SUV max of 9.8 and right hilar lymph nodes that was only borderline hypermetabolic with SUV max of 4.0. There is no evidence for distant metastasis on the PET scan. The patient was referred to Dr. Delton Coombes and on 09/04/2012 she underwent fiberoptic bronchoscopy with endobronchial ultrasound and biopsies. The final pathology (Accession #: [id number]) from the fine needle aspiration of 7 node showed malignant cells consistent with adenocarcinoma.  Dr. Delton Coombes kindly referred the patient to me today for evaluation and recommendation regarding her treatment. I ordered MRI of the brain before the patient is visit today to rule out brain metastasis. This was done on 09/16/2012 and was unremarkable for any metastatic disease  to the brain.  The patient denied having any significant complaints today except for occasional right-sided shoulder pain. She denied having any significant cough or hemoptysis and no shortness of breath. She has no significant weight loss but has occasional hot flashes. Her family history is unremarkable for any malignancy. The patient is married and has 3 children. She was accompanied today by her husband Linda Donovan. 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 She has a history of smoking one pack per day for around 20 years but quit 26 years ago. She does drink 1-2 drinks every night and no history of drug abuse.  @SFHPI @  Past Medical History  Diagnosis Date  . COPD (chronic obstructive pulmonary disease)   . ADD (attention deficit disorder)   . Non-small cell lung cancer 09/18/2012    Past Surgical History  Procedure Laterality Date  . Total abdominal hysterectomy    . Shoulder arthroscopy    . Video bronchoscopy Bilateral 07/25/2012    Procedure: VIDEO BRONCHOSCOPY WITH FLUORO;  Surgeon: Nyoka Cowden, MD;  Location: [facility];  Service: Endoscopy;  Laterality: Bilateral;  . Lump removed from breasr right  2003  . Endobronchial ultrasound Bilateral 09/04/2012    Procedure: ENDOBRONCHIAL ULTRASOUND;  Surgeon: Leslye Peer, MD;  Location: [facility];  Service: Cardiopulmonary;  Laterality: Bilateral;    Family History  Problem Relation Age of Onset  . COPD Paternal Uncle   . Heart disease Mother   . Heart disease Maternal Grandmother   . Cancer Paternal Grandmother   . Prostate cancer Maternal Grandfather     Social History History  Substance Use  Topics  . Smoking status: Former Smoker -- 1.00 packs/day for 20 years    Types: Cigarettes    Quit date: 06/07/1996  . Smokeless tobacco: Never Used  . Alcohol Use: Yes    Allergies  Allergen Reactions  . Clinoril (Sulindac)   . Morphine And Related     itch    Current Outpatient Prescriptions  Medication Sig Dispense  Refill  . albuterol (PROVENTIL HFA;VENTOLIN HFA) 108 (90 BASE) MCG/ACT inhaler Inhale 2 puffs into the lungs every 6 (six) hours as needed for wheezing.      Marland Kitchen amphetamine-dextroamphetamine (ADDERALL) 20 MG tablet Take 20 mg by mouth daily before breakfast.       . aspirin 81 MG tablet Take 81 mg by mouth daily.      . calcium-vitamin D (OSCAL WITH D) 250-125 MG-UNIT per tablet Take 1 tablet by mouth daily.      Marland Kitchen loratadine (CLARITIN) 10 MG tablet Take 10 mg by mouth daily.      . mometasone (NASONEX) 50 MCG/ACT nasal spray Place 2 sprays into the nose daily.      . Multiple Vitamins-Minerals (MULTIVITAMIN WITH MINERALS) tablet Take 1 tablet by mouth daily.      . traZODone (DESYREL) 50 MG tablet Take 50 mg by mouth at bedtime.       No current facility-administered medications for this visit.    Review of Systems  A comprehensive review of systems was negative except for: Intermittent right shoulder blade pain.  Physical Exam  ZOX:WRUEA, healthy, no distress, well nourished and well developed SKIN: skin color, texture, turgor are normal HEAD: Normocephalic EYES: normal, PERRLA EARS: External ears normal OROPHARYNX:no exudate and no erythema  NECK: supple, no adenopathy LYMPH:  no palpable lymphadenopathy, no hepatosplenomegaly BREAST:not examined LUNGS: clear to auscultation  HEART: regular rate & rhythm, no murmurs and no gallops ABDOMEN:abdomen soft, non-tender, normal bowel sounds and no masses or organomegaly BACK: Back symmetric, no curvature. EXTREMITIES:no joint deformities, effusion, or inflammation, no edema, no skin discoloration, no clubbing  NEURO: alert & oriented x 3 with fluent speech, no focal motor/sensory deficits  PERFORMANCE STATUS: ECOG 1  LABORATORY DATA: Lab Results  Component Value Date   WBC 9.6 09/04/2012   HGB 16.5* 09/04/2012   HCT 48.1* 09/04/2012   MCV 96.2 09/04/2012   PLT 351 09/04/2012      Chemistry      Component Value Date/Time   NA  138 09/04/2012 0815   K 3.4* 09/04/2012 0815   CL 100 09/04/2012 0815   CO2 28 09/04/2012 0815   BUN 6 09/04/2012 0815   CREATININE 0.65 09/04/2012 0815      Component Value Date/Time   CALCIUM 9.3 09/04/2012 0815   ALKPHOS 90 09/04/2012 0815   AST 22 09/04/2012 0815   ALT 16 09/04/2012 0815   BILITOT 0.6 09/04/2012 0815       RADIOGRAPHIC STUDIES: Mr Laqueta Jean VW Contrast  09/24/2012  *RADIOLOGY REPORT*  Clinical Data: Lung cancer, staging  MRI HEAD WITHOUT AND WITH CONTRAST  Technique:  Multiplanar, multiecho pulse sequences of the brain and surrounding structures were obtained according to standard protocol without and with intravenous contrast  Contrast: 12mL MULTIHANCE GADOBENATE DIMEGLUMINE 529 MG/ML IV SOLN  Comparison: PET scan 08/04/2012.  Findings: There is no evidence for acute infarction, intracranial hemorrhage, mass lesion, hydrocephalus, or extra-axial fluid. There is no significant atrophy or white matter disease.  No evidence for remote cortical infarction.  Post infusion, no abnormal intracranial  enhancement of the brain or meninges.  Major intracranial vascular structures patent. No osseous destructive lesion.  Extracranial soft tissues unremarkable.  No sinus or mastoid disease.  IMPRESSION: Unremarkable cranial MRI without and with contrast.  No evidence for metastatic disease to the brain or its coverings.   Original Report Authenticated By: Davonna Belling, M.D.     ASSESSMENT: This is a very pleasant 57 years old white female recently diagnosed with a stage IIIa (T3, N2, M0) non-small cell lung cancer consistent with adenocarcinoma with a large right upper lobe mass and subcarinal lymphadenopathy.    PLAN: I have a lengthy discussion with the patient and her husband today about her disease stage, prognosis and treatment options. In addition to palliative care, I discussed with the patient the option of treatment with concurrent chemoradiation with weekly carboplatin and paclitaxel. I  recommended for the patient's systemic chemotherapy in the form of carboplatin for AUC of 2 and paclitaxel 45 mg/M2 given weekly basis concurrent with radiation for 6-7 weeks followed by break as well as restaging scan.  I discussed with the patient adverse effect of the chemotherapy including but not limited to alopecia, myelosuppression, nausea and vomiting, peripheral neuropathy, liver or in dysfunction. I expect the patient to start the first cycle of her treatment next week. I referred the patient to see radiation oncology for consideration of concurrent radiotherapy. The patient will have a chemoradiation class before starting the first cycle of her treatment. I would also consider sending her tissue block to be tested for EGFR mutation as well as ALK gene translocation. She would come back for followup visit in 2 weeks. I gave the patient and her husband the time to ask questions and I answered them completely to their satisfaction. I will call her pharmacy was prescription for Compazine 10 mg by mouth every 6 hours as needed for nausea. All questions were answered. The patient knows to call the clinic with any problems, questions or concerns. We can certainly see the patient much sooner if necessary.  Thank you so much for allowing me to participate in the care of Linda Donovan. I will continue to follow up the patient with you and assist in her care.  I spent 40 minutes counseling the patient face to face. The total time spent in the appointment was 70 minutes.  CODE STATUS: Full code.  George Haggart K. 09/18/2012, 5:59 PM

## 2012-09-18 NOTE — Patient Instructions (Signed)
You are recently diagnosed with stage IIIa non-small cell lung cancer. We discussed treatment options including concurrent chemoradiation. Followup in 2 weeks.

## 2012-09-19 ENCOUNTER — Other Ambulatory Visit: Payer: BC Managed Care – PPO

## 2012-09-19 ENCOUNTER — Telehealth: Payer: Self-pay | Admitting: *Deleted

## 2012-09-19 ENCOUNTER — Encounter: Payer: Self-pay | Admitting: *Deleted

## 2012-09-19 ENCOUNTER — Telehealth: Payer: Self-pay | Admitting: Internal Medicine

## 2012-09-19 NOTE — Telephone Encounter (Signed)
Per staff message and POF I have scheduled appts.  JMW  

## 2012-09-20 ENCOUNTER — Encounter: Payer: Self-pay | Admitting: Radiation Oncology

## 2012-09-20 NOTE — Progress Notes (Signed)
Thoracic Location of Tumor / Histology:IIIA (T3,N2, M0) Non small cell lung cancer consistent with adenocarcinoma with a large right uper lobe mass and subcarinal lymphadenopathy.  Patient presented to pulmonologist 07/19/12 where a chest xray was order to evaluate long hx of COPD.  Biopsies of endobronchial area (if applicable) revealed: 7 node s positive for malignant cells consistent with adenocarcinoma.  Tobacco/Marijuana/Snuff/ETOH use: Former smoker for 20 years, 1PPD, Rosezena Sensor 06/07/96; occasional alcohol use  Past/Anticipated interventions by cardiothoracic surgery, if any: No surgical intervention noted.  Past/Anticipated interventions by medical oncology, if any: Dr. Arbutus Ped recommends concurrent chemoradiation to start the first cycle of her treatment next week.  Signs/Symptoms  Weight changes, if any: Denies  Respiratory complaints, if any: 09/18/2012 patient denied to Dr. Arbutus Ped any symptoms of cough, hemoptysis or SOB.  Hemoptysis, if any: Denies  Pain issues, if any:    SAFETY ISSUES:  Prior radiation? NO   Pacemaker/ICD? NO  Possible current pregnancy? NO  Is the patient on methotrexate? NO  Current Complaints / other details:  57 year old. Female. Married. Three children. Husband's name is Alinda Money. 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 Patient reports on 09/18/2012 to Dr. Arbutus Ped occasional hot flashes but, denies all other symptoms.

## 2012-09-21 ENCOUNTER — Ambulatory Visit
Admission: RE | Admit: 2012-09-21 | Discharge: 2012-09-21 | Disposition: A | Discharge status: Home / Self Care | Payer: BC Managed Care – PPO | Source: Ambulatory Visit | Attending: Radiation Oncology | Admitting: Radiation Oncology

## 2012-09-21 ENCOUNTER — Encounter: Payer: Self-pay | Admitting: Radiation Oncology

## 2012-09-21 VITALS — BP 145/71 | HR 77 | Temp 97.5°F | Resp 16 | Ht 63.0 in | Wt 132.5 lb

## 2012-09-21 DIAGNOSIS — C3490 Malignant neoplasm of unspecified part of unspecified bronchus or lung: Secondary | ICD-10-CM | POA: Insufficient documentation

## 2012-09-21 DIAGNOSIS — C3491 Malignant neoplasm of unspecified part of right bronchus or lung: Secondary | ICD-10-CM

## 2012-09-21 DIAGNOSIS — Z510 Encounter for antineoplastic radiation therapy: Secondary | ICD-10-CM | POA: Insufficient documentation

## 2012-09-21 NOTE — Progress Notes (Signed)
  Radiation Oncology         [phone] ________________________________  Name: Linda Donovan MRN: [id number]  Date: 09/21/2012  DOB: 04-04-1956  SIMULATION AND TREATMENT PLANNING NOTE  DIAGNOSIS:  Stage IIIa (T3, N2, M0) non-small cell lung cancer   NARRATIVE:  The patient was brought to the CT Simulation planning suite.  Identity was confirmed.  All relevant records and images related to the planned course of therapy were reviewed.  The patient freely provided informed written consent to proceed with treatment after reviewing the details related to the planned course of therapy. The consent form was witnessed and verified by the simulation staff.  Then, the patient was set-up in a stable reproducible  supine position for radiation therapy.  CT images were obtained.  Surface markings were placed.  The CT images were loaded into the planning software.  Then the target and avoidance structures were contoured.  Treatment planning then occurred.  The radiation prescription was entered and confirmed.  Then, I designed and supervised the construction of a total of 5 medically necessary complex treatment devices.  I have requested : 3D Simulation  I have requested a DVH of the following structures: Tumor volume, esophagus, lungs.  I have ordered:dose calc.  PLAN:  The patient will receive 63 Gy in 35 fractions.  _______________________________  Special treatment procedure note  The patient will be receiving radiosensitizing chemotherapy throughout her course of treatment. Given the increased potential for toxicities as well as the necessity for close monitoring of the patient and bloodwork, this constitutes a special treatment procedure.    -----------------------------------  Billie Lade, PhD, MD

## 2012-09-21 NOTE — Progress Notes (Signed)
Patient presents to the clinic today accompanied by her husband for new consult with Dr. Roselind Messier to discuss the role of radiation therapy in the treatment of non small cell lung ca. Patient alert and oriented to person, place, and time. No distress noted. Steady gait noted. Pleasant affect noted. Patient reports intermittent right shoulder blade pain 2 on a scale of 0-10 for which she takes no pain medication for relief. Patient reports a lose of only 4 lb since diagnosis. Patient reports lack of appetite. Patient reports taste changes. Patient denies nausea, vomiting, headaches or dizziness. Patient reports hot flashes. Patient denies cough, shortness of breath, dypnea, or hemoptysis. Patient denies diarrhea or constipation. Patient reports long standing issues with inability of sleeping but, reports trazadone helps. Reported all findings to Dr. Roselind Messier.

## 2012-09-21 NOTE — Progress Notes (Signed)
Radiation Oncology         [phone] ________________________________  Initial outpatient Consultation  Name: Linda Donovan MRN: [id number]  Date: 09/21/2012  DOB: 1955-08-03  CC:Pcp Not In System  Hastings, Arbutus Ped, MD   REFERRING PHYSICIAN: Si Gaul, MD  DIAGNOSIS: Stage IIIa (T3, N2, M0) non-small cell lung cancer consistent with adenocarcinoma with a large right upper lobe mass and subcarinal lymphadenopathy.   HISTORY OF PRESENT ILLNESS::Linda Donovan is a 57 y.o. female who is seen out of the courtesy of Dr. Arbutus Ped for an opinion concerning radiation therapy as part of management of patient's recently diagnosed locally advanced non-small cell lung cancer. The patient presented earlier this year with mild hemoptysis. This prompted a chest x-ray and subsequent CT scan. The patient was noted to have a large mass in the right upper lung area. In addition subcarinal adenopathy was appreciated. Patient underwent bronchoscopy and biopsy which was nondiagnostic.The patient was referred to Dr. Delton Coombes and on 09/04/2012 she underwent fiberoptic bronchoscopy with endobronchial ultrasound and biopsies. The final pathology (Accession #: [id number]) from the fine needle aspiration of 7 node showed malignant cells consistent with adenocarcinoma.  A PET scan was performed on 08/04/2012 and it showed 7.9 x 5.1 CM spiculated right upper lobe mass was noted hypermetabolic activity and SUV max of 16.6 with associated postobstructive pneumonitis. There was evidence of a dominant hypermetabolic subcarinal nodal mass which measured 2.6 x 3.3 CM with SUV max of 9.8 and right hilar lymph nodes that was only borderline hypermetabolic with SUV max of 4.0. There is no evidence for distant metastasis on the PET scan.  He brain MRI was performed to complete his staging workup which was negative for brain metastasis.   With this information the patient was referred to medical oncology and subsequently to radiation  oncology consideration for definitive treatment.     PREVIOUS RADIATION THERAPY: No  PAST MEDICAL HISTORY:  has a past medical history of COPD (chronic obstructive pulmonary disease); ADD (attention deficit disorder); and Non-small cell lung cancer (09/18/2012).    PAST SURGICAL HISTORY: Past Surgical History  Procedure Laterality Date  . Total abdominal hysterectomy    . Shoulder arthroscopy    . Video bronchoscopy Bilateral 07/25/2012    Procedure: VIDEO BRONCHOSCOPY WITH FLUORO;  Surgeon: Nyoka Cowden, MD;  Location: [facility];  Service: Endoscopy;  Laterality: Bilateral;  . Lump removed from breasr right  2003  . Endobronchial ultrasound Bilateral 09/04/2012    Procedure: ENDOBRONCHIAL ULTRASOUND;  Surgeon: Leslye Peer, MD;  Location: [facility];  Service: Cardiopulmonary;  Laterality: Bilateral;    FAMILY HISTORY: family history includes COPD in her paternal uncle; Cancer in her maternal grandfather and paternal grandmother; Heart disease in her maternal grandmother and mother; and Prostate cancer in her maternal grandfather.  SOCIAL HISTORY:  reports that she quit smoking about 16 years ago. Her smoking use included Cigarettes. She has a 20 pack-year smoking history. She has never used smokeless tobacco. She reports that  drinks alcohol. She reports that she does not use illicit drugs.  ALLERGIES: Clinoril and Morphine and related  MEDICATIONS:  Current Outpatient Prescriptions  Medication Sig Dispense Refill  . albuterol (PROVENTIL HFA;VENTOLIN HFA) 108 (90 BASE) MCG/ACT inhaler Inhale 2 puffs into the lungs every 6 (six) hours as needed for wheezing.      Marland Kitchen amphetamine-dextroamphetamine (ADDERALL) 20 MG tablet Take 20 mg by mouth daily before breakfast.       . Ascorbic Acid (VITAMIN C) 1000  MG tablet Take 1,000 mg by mouth daily.      Marland Kitchen aspirin 81 MG tablet Take 81 mg by mouth daily.      . calcium-vitamin D (OSCAL WITH D) 250-125 MG-UNIT per tablet Take 1 tablet by  mouth daily.      Marland Kitchen loratadine (CLARITIN) 10 MG tablet Take 10 mg by mouth daily.      . mometasone (NASONEX) 50 MCG/ACT nasal spray Place 2 sprays into the nose daily.      . Multiple Vitamins-Minerals (MULTIVITAMIN WITH MINERALS) tablet Take 1 tablet by mouth daily.      . traZODone (DESYREL) 50 MG tablet Take 50 mg by mouth at bedtime.      . prochlorperazine (COMPAZINE) 10 MG tablet Take 1 tablet (10 mg total) by mouth every 6 (six) hours as needed.  60 tablet  0   No current facility-administered medications for this encounter.    REVIEW OF SYSTEMS:  A 15 point review of systems is documented in the electronic medical record. This was obtained by the nursing staff. However, I reviewed this with the patient to discuss relevant findings and make appropriate changes.  As above the patient presented with mild hemoptysis. She denies any shortness of breath. She has mild pain in the right shoulder area but has had surgery in this region.  Her appetite is good. She denies any significant fatigue at this time.     PHYSICAL EXAM:  height is 5\' 3"  (1.6 m) and weight is 132 lb 8 oz (60.102 kg). Her oral temperature is 97.5 F (36.4 C). Her blood pressure is 145/71 and her pulse is 77. Her respiration is 16 and oxygen saturation is 96%.   BP 145/71  Pulse 77  Temp(Src) 97.5 F (36.4 C) (Oral)  Resp 16  Ht 5\' 3"  (1.6 m)  Wt 132 lb 8 oz (60.102 kg)  BMI 23.48 kg/m2  SpO2 96%  General Appearance:    Alert, cooperative, no distress, appears stated age  Head:    Normocephalic, without obvious abnormality, atraumatic  Eyes:    PERRL, conjunctiva/corneas clear, EOM's intact        Nose:   Nares normal, septum midline, mucosa normal, no drainage    or sinus tenderness  Throat:   Lips, mucosa, and tongue normal; teeth and gums normal  Neck:   Supple, symmetrical, trachea midline, no adenopathy;    thyroid:  no enlargement/tenderness/nodules; no carotid   bruit or JVD  Back:     Symmetric, no  curvature, ROM normal, no CVA tenderness  Lungs:     Clear to auscultation bilaterally, respirations unlabored  Chest Wall:    No tenderness or deformity   Heart:    Regular rate and rhythm,  no murmur, rub   or gallop     Abdomen:     Soft, non-tender, bowel sounds active all four quadrants,    no masses, no organomegaly        Extremities:   Extremities normal, atraumatic, no cyanosis or edema  Pulses:   2+ and symmetric all extremities  Skin:   Skin color, texture, turgor normal, no rashes or lesions  Lymph nodes:   Cervical, supraclavicular, and axillary nodes normal  Neurologic:    normal strength, sensation and reflexes    throughout    LABORATORY DATA:  Lab Results  Component Value Date   WBC 9.6 09/04/2012   HGB 16.5* 09/04/2012   HCT 48.1* 09/04/2012   MCV 96.2 09/04/2012  PLT 351 09/04/2012   Lab Results  Component Value Date   NA 138 09/04/2012   K 3.4* 09/04/2012   CL 100 09/04/2012   CO2 28 09/04/2012   Lab Results  Component Value Date   ALT 16 09/04/2012   AST 22 09/04/2012   ALKPHOS 90 09/04/2012   BILITOT 0.6 09/04/2012     RADIOGRAPHY: Mr Laqueta Jean ZO Contrast  09/16/2012  *RADIOLOGY REPORT*  Clinical Data: Lung cancer, staging  MRI HEAD WITHOUT AND WITH CONTRAST  Technique:  Multiplanar, multiecho pulse sequences of the brain and surrounding structures were obtained according to standard protocol without and with intravenous contrast  Contrast: 12mL MULTIHANCE GADOBENATE DIMEGLUMINE 529 MG/ML IV SOLN  Comparison: PET scan 08/04/2012.  Findings: There is no evidence for acute infarction, intracranial hemorrhage, mass lesion, hydrocephalus, or extra-axial fluid. There is no significant atrophy or white matter disease.  No evidence for remote cortical infarction.  Post infusion, no abnormal intracranial enhancement of the brain or meninges.  Major intracranial vascular structures patent. No osseous destructive lesion.  Extracranial soft tissues unremarkable.  No sinus or  mastoid disease.  IMPRESSION: Unremarkable cranial MRI without and with contrast.  No evidence for metastatic disease to the brain or its coverings.   Original Report Authenticated By: Davonna Belling, M.D.       IMPRESSION:  Stage IIIa (T3, N2, M0) non-small cell lung cancer. The patient will be a good candidate for a definitive course of radiation along with radiosensitizing chemotherapy. The patient does wish to the aggressive with her treatment and to proceed with the above recommended course of treatment. She appears to understand the side effects and potential long-term toxicities of high dose radiation therapy in the chest region.  PLAN: Simulation and planning later this morning. The patient will begin her chemotherapy on April 21. Her chest radiation therapy will begin on April 24.  I anticipate 7 weeks of radiation therapy as part of her overall management.  I spent 60 minutes minutes face to face with the patient and more than 50% of that time was spent in counseling and/or coordination of care.   ------------------------------------------------   Billie Lade, PhD, MD

## 2012-09-21 NOTE — Progress Notes (Signed)
Complete PATIENT MEASURE OF DISTRESS worksheet with a score of 4 submitted to social work. Patient stressed by lack of communication and frequent changes in scheduling by medical oncology.

## 2012-09-21 NOTE — Progress Notes (Signed)
See progress note under physician encounter. 

## 2012-09-22 NOTE — Addendum Note (Signed)
Encounter addended by: Galileah Piggee Mintz Jozi Malachi, RN on: 09/22/2012  7:27 PM<BR>     Documentation filed: Charges VN

## 2012-09-25 ENCOUNTER — Ambulatory Visit (HOSPITAL_BASED_OUTPATIENT_CLINIC_OR_DEPARTMENT_OTHER): Payer: BC Managed Care – PPO

## 2012-09-25 ENCOUNTER — Ambulatory Visit (INDEPENDENT_AMBULATORY_CARE_PROVIDER_SITE_OTHER): Payer: BC Managed Care – PPO | Admitting: Emergency Medicine

## 2012-09-25 ENCOUNTER — Encounter: Payer: Self-pay | Admitting: Emergency Medicine

## 2012-09-25 ENCOUNTER — Other Ambulatory Visit (HOSPITAL_BASED_OUTPATIENT_CLINIC_OR_DEPARTMENT_OTHER): Payer: BC Managed Care – PPO | Admitting: Lab

## 2012-09-25 VITALS — BP 142/79 | HR 95 | Temp 97.1°F | Resp 16 | Wt 132.0 lb

## 2012-09-25 VITALS — BP 120/80 | HR 98 | Temp 97.4°F | Ht 63.0 in | Wt 131.6 lb

## 2012-09-25 DIAGNOSIS — C3410 Malignant neoplasm of upper lobe, unspecified bronchus or lung: Secondary | ICD-10-CM

## 2012-09-25 DIAGNOSIS — C3491 Malignant neoplasm of unspecified part of right bronchus or lung: Secondary | ICD-10-CM

## 2012-09-25 DIAGNOSIS — C3490 Malignant neoplasm of unspecified part of unspecified bronchus or lung: Secondary | ICD-10-CM

## 2012-09-25 DIAGNOSIS — J441 Chronic obstructive pulmonary disease with (acute) exacerbation: Secondary | ICD-10-CM

## 2012-09-25 DIAGNOSIS — Z5111 Encounter for antineoplastic chemotherapy: Secondary | ICD-10-CM

## 2012-09-25 LAB — CBC WITH DIFFERENTIAL/PLATELET
Basophils Absolute: 0 10*3/uL (ref 0.0–0.1)
HCT: 50 % — ABNORMAL HIGH (ref 34.8–46.6)
HGB: 16.1 g/dL — ABNORMAL HIGH (ref 11.6–15.9)
MONO#: 0.7 10*3/uL (ref 0.1–0.9)
NEUT%: 77.6 % — ABNORMAL HIGH (ref 38.4–76.8)
WBC: 11.7 10*3/uL — ABNORMAL HIGH (ref 3.9–10.3)
lymph#: 1.7 10*3/uL (ref 0.9–3.3)

## 2012-09-25 LAB — COMPREHENSIVE METABOLIC PANEL (CC13)
BUN: 8.2 mg/dL (ref 7.0–26.0)
CO2: 22 mEq/L (ref 22–29)
Calcium: 9.5 mg/dL (ref 8.4–10.4)
Chloride: 104 mEq/L (ref 98–107)
Creatinine: 0.6 mg/dL (ref 0.6–1.1)

## 2012-09-25 MED ORDER — FAMOTIDINE IN NACL 20-0.9 MG/50ML-% IV SOLN
20.0000 mg | Freq: Once | INTRAVENOUS | Status: AC
  Administered 2012-09-25: 20 mg via INTRAVENOUS

## 2012-09-25 MED ORDER — SODIUM CHLORIDE 0.9 % IV SOLN
45.0000 mg/m2 | Freq: Once | INTRAVENOUS | Status: AC
  Administered 2012-09-25: 72 mg via INTRAVENOUS
  Filled 2012-09-25: qty 12

## 2012-09-25 MED ORDER — PACLITAXEL CHEMO INJECTION 300 MG/50ML
45.0000 mg/m2 | Freq: Once | INTRAVENOUS | Status: DC
  Filled 2012-09-25: qty 12

## 2012-09-25 MED ORDER — ONDANSETRON 16 MG/50ML IVPB (CHCC)
16.0000 mg | Freq: Once | INTRAVENOUS | Status: AC
  Administered 2012-09-25: 16 mg via INTRAVENOUS

## 2012-09-25 MED ORDER — SODIUM CHLORIDE 0.9 % IV SOLN
Freq: Once | INTRAVENOUS | Status: AC
  Administered 2012-09-25: 10:00:00 via INTRAVENOUS

## 2012-09-25 MED ORDER — DIPHENHYDRAMINE HCL 50 MG/ML IJ SOLN
50.0000 mg | Freq: Once | INTRAMUSCULAR | Status: AC
  Administered 2012-09-25: 50 mg via INTRAVENOUS

## 2012-09-25 MED ORDER — SODIUM CHLORIDE 0.9 % IV SOLN
226.0000 mg | Freq: Once | INTRAVENOUS | Status: AC
  Administered 2012-09-25: 230 mg via INTRAVENOUS
  Filled 2012-09-25: qty 23

## 2012-09-25 MED ORDER — DEXAMETHASONE SODIUM PHOSPHATE 4 MG/ML IJ SOLN
20.0000 mg | Freq: Once | INTRAMUSCULAR | Status: AC
  Administered 2012-09-25: 20 mg via INTRAVENOUS

## 2012-09-25 NOTE — Progress Notes (Signed)
Subjective:    Patient ID: Linda Donovan, female    DOB: 01-Dec-1955  MRN: [id number] HPI Primary Linda Donovan  57 yowf quit smoking 1998 with sob that improved to point were stopped all medications x rare  saba  self referred to pulmonary clinic for hemoptysis.  1st pulmonary eval in EMR era/ [facility] cc  Sob with inclines and cold weather and steps and no increase in saba indolent onset rattling cough and throat congestion onset mid Jan 2014 with white phlegm streaked with blood and sev times a week esp in am's and no epistaxis. No more blood x 5 days, ? Back of throat.   No obvious pattern to  variabilty or assoc sorethroat cp or chest tightness, subjective wheeze overt sinus or hb symptoms. No unusual exp hx or h/o childhood pna/ asthma or premature birth to his knowledge.   ROV 08/25/12 -- 57 yo former smoker, seen by Dr Sherene Sires for hemoptysis. 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 S/p FOB that was non-diagnostic. She is referred after PET scan to consider another bx. Her hemoptysis has resolved.  She is c/o nasal gtt.   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 She is undergoing rx with Dr Arbutus Ped, concurrent chemo + XRT. She is very concerned that her FOB's aren't covered by [employer]. No more hemoptysis.  She has exertional dyspnea.    Review of Systems  Constitutional: Negative for fever and unexpected weight change.  HENT: Positive for congestion and postnasal drip. Negative for ear pain, nosebleeds, sore throat, rhinorrhea, sneezing, trouble swallowing, dental problem and sinus pressure.   Eyes: Negative for redness and itching.  Respiratory: Positive for cough and shortness of breath. Negative for chest tightness and wheezing.   Cardiovascular: Negative for palpitations and leg swelling.  Gastrointestinal: Negative for nausea and vomiting.  Genitourinary: Negative for dysuria.  Musculoskeletal: Negative for joint swelling.  Skin:  Negative for rash.  Neurological: Negative for headaches.  Hematological: Does not bruise/bleed easily.  Psychiatric/Behavioral: Negative for dysphoric mood. The patient is not nervous/anxious.       Objective:   Physical Exam Wt Readings from Last 3 Encounters:  09/25/12 131 lb 9.6 oz (59.693 kg)  09/25/12 132 lb (59.875 kg)  09/21/12 132 lb 8 oz (60.102 kg)    HEENT mild turbinate edema.  Oropharynx no thrush or excess pnd or cobblestoning.  No JVD or cervical adenopathy. Mild accessory muscle hypertrophy. Trachea midline, nl thryroid. Chest was hyperinflated by percussion with diminished breath sounds and moderate increased exp time without wheeze. Hoover sign positive at mid inspiration. Regular rate and rhythm without murmur gallop or rub or increase P2 or edema.  Abd: no hsm, nl excursion. Ext warm without cyanosis or clubbing.    CXR  Moderate to severe COPD.  7.7 cm right upper lobe mass compatible with primary lung cancer.  Nodular density over left upper lobe could represent a calcified  granuloma or sclerotic area within the left second rib.   PET scan 08/04/12:  IMPRESSION:  1. Large hypermetabolic right upper lobe mass with associated  subcarinal nodal metastasis. 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  2. Severe emphysema with patchy right upper lobe infiltrate  anteriorly.  3. Cholelithiasis.      Assessment & Plan:  COPD  GOLD III Limited exertion. Not currently on BD's. 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  - she  wants to defer full PFT for now - has been on spiriva and advair before, doesn't want to start a new med at this time. I have agree to wait on this, revisit at our next visit - ov 6 mon  Non-small cell lung cancer - being managed by Dr Arbutus Ped

## 2012-09-25 NOTE — Assessment & Plan Note (Signed)
-   being managed by Dr Arbutus Ped

## 2012-09-25 NOTE — Patient Instructions (Addendum)
[facility] Discharge Instructions for Patients Receiving Chemotherapy  Today you received the following chemotherapy agents Taxol and Carboplatin.  To help prevent nausea and vomiting after your treatment, we encourage you to take your nausea medication as prescribed.   If you develop nausea and vomiting that is not controlled by your nausea medication, call the clinic. If it is after clinic hours your family physician or the after hours number for the clinic or go to the Emergency Department.   BELOW ARE SYMPTOMS THAT SHOULD BE REPORTED IMMEDIATELY:  *FEVER GREATER THAN 100.5 F  *CHILLS WITH OR WITHOUT FEVER  NAUSEA AND VOMITING THAT IS NOT CONTROLLED WITH YOUR NAUSEA MEDICATION  *UNUSUAL SHORTNESS OF BREATH  *UNUSUAL BRUISING OR BLEEDING  TENDERNESS IN MOUTH AND THROAT WITH OR WITHOUT PRESENCE OF ULCERS  *URINARY PROBLEMS  *BOWEL PROBLEMS  UNUSUAL RASH Items with * indicate a potential emergency and should be followed up as soon as possible.  One of the nurses will contact you 24 hours after your treatment. Please let the nurse know about any problems that you may have experienced. Feel free to call the clinic you have any questions or concerns. The clinic phone number is [phone].   I have been informed and understand all the instructions given to me. I know to contact the clinic, my physician, or go to the Emergency Department if any problems should occur. I do not have any questions at this time, but understand that I may call the clinic during office hours   should I have any questions or need assistance in obtaining follow up care.    __________________________________________  _____________  __________ Signature of Patient or Authorized Representative            Date                   Time    __________________________________________ Nurse's Signature    

## 2012-09-25 NOTE — Patient Instructions (Addendum)
We will further investigate the insurance questions regarding your bronchoscopy.  We will wait to order any new breathing tests or to start any new medications for now. We will reconsider next time.  Follow with Dr Delton Coombes in 6 months or sooner if you have any problems

## 2012-09-25 NOTE — Assessment & Plan Note (Signed)
Limited exertion. Not currently on BD's. 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  - she wants to defer full PFT for now - has been on spiriva and advair before, doesn't want to start a new med at this time. I have agree to wait on this, revisit at our next visit - ov 6 mon

## 2012-09-26 ENCOUNTER — Telehealth: Payer: Self-pay | Admitting: *Deleted

## 2012-09-26 NOTE — Telephone Encounter (Signed)
Spoke with patient who says she is doing well.  Does not have any complaints and says she is just happy to be doing so well.  Denies questions at this time.

## 2012-09-26 NOTE — Telephone Encounter (Signed)
Message copied by Augusto Garbe on Tue Sep 26, 2012 12:54 PM ------      Message from: Keturah Barre      Created: Mon Sep 25, 2012 12:26 PM      Regarding: chemo follow up       Pt received first time Taxol and Carboplatin on 09/25/12.      Dr. Arbutus Ped. Call pt at  [phone]. ------

## 2012-09-27 ENCOUNTER — Ambulatory Visit: Payer: BC Managed Care – PPO | Admitting: Radiation Oncology

## 2012-09-28 ENCOUNTER — Ambulatory Visit
Admission: RE | Admit: 2012-09-28 | Discharge: 2012-09-28 | Disposition: A | Discharge status: Home / Self Care | Payer: BC Managed Care – PPO | Source: Ambulatory Visit | Attending: Radiation Oncology | Admitting: Radiation Oncology

## 2012-09-29 ENCOUNTER — Ambulatory Visit
Admission: RE | Admit: 2012-09-29 | Discharge: 2012-09-29 | Disposition: A | Discharge status: Home / Self Care | Payer: BC Managed Care – PPO | Source: Ambulatory Visit | Attending: Radiation Oncology | Admitting: Radiation Oncology

## 2012-10-02 ENCOUNTER — Ambulatory Visit (HOSPITAL_BASED_OUTPATIENT_CLINIC_OR_DEPARTMENT_OTHER): Payer: BC Managed Care – PPO

## 2012-10-02 ENCOUNTER — Ambulatory Visit: Payer: BC Managed Care – PPO | Admitting: Internal Medicine

## 2012-10-02 ENCOUNTER — Other Ambulatory Visit (HOSPITAL_BASED_OUTPATIENT_CLINIC_OR_DEPARTMENT_OTHER): Payer: BC Managed Care – PPO | Admitting: Lab

## 2012-10-02 ENCOUNTER — Ambulatory Visit: Payer: BC Managed Care – PPO | Admitting: Nutrition

## 2012-10-02 ENCOUNTER — Ambulatory Visit
Admission: RE | Admit: 2012-10-02 | Discharge: 2012-10-02 | Disposition: A | Discharge status: Home / Self Care | Payer: BC Managed Care – PPO | Source: Ambulatory Visit | Attending: Radiation Oncology | Admitting: Radiation Oncology

## 2012-10-02 ENCOUNTER — Encounter: Payer: Self-pay | Admitting: Internal Medicine

## 2012-10-02 ENCOUNTER — Other Ambulatory Visit: Payer: BC Managed Care – PPO | Admitting: Lab

## 2012-10-02 ENCOUNTER — Telehealth: Payer: Self-pay | Admitting: Internal Medicine

## 2012-10-02 ENCOUNTER — Ambulatory Visit (HOSPITAL_BASED_OUTPATIENT_CLINIC_OR_DEPARTMENT_OTHER): Payer: BC Managed Care – PPO | Admitting: Internal Medicine

## 2012-10-02 VITALS — BP 156/93 | HR 107 | Temp 97.7°F | Resp 18 | Ht 63.0 in | Wt 132.3 lb

## 2012-10-02 DIAGNOSIS — C3410 Malignant neoplasm of upper lobe, unspecified bronchus or lung: Secondary | ICD-10-CM

## 2012-10-02 DIAGNOSIS — C3491 Malignant neoplasm of unspecified part of right bronchus or lung: Secondary | ICD-10-CM

## 2012-10-02 DIAGNOSIS — Z5111 Encounter for antineoplastic chemotherapy: Secondary | ICD-10-CM

## 2012-10-02 LAB — CBC WITH DIFFERENTIAL/PLATELET
Basophils Absolute: 0 10*3/uL (ref 0.0–0.1)
Eosinophils Absolute: 0.3 10*3/uL (ref 0.0–0.5)
HCT: 43.5 % (ref 34.8–46.6)
HGB: 14.8 g/dL (ref 11.6–15.9)
LYMPH%: 22.9 % (ref 14.0–49.7)
MCV: 94.4 fL (ref 79.5–101.0)
MONO#: 0.7 10*3/uL (ref 0.1–0.9)
MONO%: 7.4 % (ref 0.0–14.0)
NEUT#: 5.9 10*3/uL (ref 1.5–6.5)
NEUT%: 66 % (ref 38.4–76.8)
Platelets: 296 10*3/uL (ref 145–400)
WBC: 8.9 10*3/uL (ref 3.9–10.3)
nRBC: 0 % (ref 0–0)

## 2012-10-02 LAB — COMPREHENSIVE METABOLIC PANEL (CC13)
ALT: 18 U/L (ref 0–55)
CO2: 27 mEq/L (ref 22–29)
Calcium: 9.3 mg/dL (ref 8.4–10.4)
Chloride: 100 mEq/L (ref 98–107)
Creatinine: 0.7 mg/dL (ref 0.6–1.1)
Glucose: 94 mg/dl (ref 70–99)

## 2012-10-02 MED ORDER — DEXAMETHASONE SODIUM PHOSPHATE 4 MG/ML IJ SOLN
20.0000 mg | Freq: Once | INTRAMUSCULAR | Status: AC
  Administered 2012-10-02: 20 mg via INTRAVENOUS

## 2012-10-02 MED ORDER — ERLOTINIB HCL 150 MG PO TABS: 150.0000 mg | ORAL_TABLET | Freq: Every day | ORAL | Status: DC

## 2012-10-02 MED ORDER — DIPHENHYDRAMINE HCL 50 MG/ML IJ SOLN
50.0000 mg | Freq: Once | INTRAMUSCULAR | Status: AC
  Administered 2012-10-02: 50 mg via INTRAVENOUS

## 2012-10-02 MED ORDER — ONDANSETRON 16 MG/50ML IVPB (CHCC)
16.0000 mg | Freq: Once | INTRAVENOUS | Status: AC
  Administered 2012-10-02: 16 mg via INTRAVENOUS

## 2012-10-02 MED ORDER — CARBOPLATIN CHEMO INJECTION 450 MG/45ML
230.0000 mg | Freq: Once | INTRAVENOUS | Status: AC
  Administered 2012-10-02: 230 mg via INTRAVENOUS
  Filled 2012-10-02: qty 23

## 2012-10-02 MED ORDER — SODIUM CHLORIDE 0.9 % IV SOLN
45.0000 mg/m2 | Freq: Once | INTRAVENOUS | Status: AC
  Administered 2012-10-02: 72 mg via INTRAVENOUS
  Filled 2012-10-02: qty 12

## 2012-10-02 MED ORDER — FAMOTIDINE IN NACL 20-0.9 MG/50ML-% IV SOLN
20.0000 mg | Freq: Once | INTRAVENOUS | Status: AC
  Administered 2012-10-02: 20 mg via INTRAVENOUS

## 2012-10-02 MED ORDER — SODIUM CHLORIDE 0.9 % IV SOLN
Freq: Once | INTRAVENOUS | Status: AC
  Administered 2012-10-02: 13:00:00 via INTRAVENOUS

## 2012-10-02 NOTE — Patient Instructions (Addendum)
[facility] Discharge Instructions for Patients Receiving Chemotherapy  Today you received the following chemotherapy agents Taxol/Carboplatin  To help prevent nausea and vomiting after your treatment, we encourage you to take your nausea medication as prescribed.   If you develop nausea and vomiting that is not controlled by your nausea medication, call the clinic. If it is after clinic hours your family physician or the after hours number for the clinic or go to the Emergency Department.   BELOW ARE SYMPTOMS THAT SHOULD BE REPORTED IMMEDIATELY:  *FEVER GREATER THAN 100.5 F  *CHILLS WITH OR WITHOUT FEVER  NAUSEA AND VOMITING THAT IS NOT CONTROLLED WITH YOUR NAUSEA MEDICATION  *UNUSUAL SHORTNESS OF BREATH  *UNUSUAL BRUISING OR BLEEDING  TENDERNESS IN MOUTH AND THROAT WITH OR WITHOUT PRESENCE OF ULCERS  *URINARY PROBLEMS  *BOWEL PROBLEMS  UNUSUAL RASH Items with * indicate a potential emergency and should be followed up as soon as possible.  Feel free to call the clinic you have any questions or concerns. The clinic phone number is [phone].   I have been informed and understand all the instructions given to me. I know to contact the clinic, my physician, or go to the Emergency Department if any problems should occur. I do not have any questions at this time, but understand that I may call the clinic during office hours   should I have any questions or need assistance in obtaining follow up care.    __________________________________________  _____________  __________ Signature of Patient or Authorized Representative            Date                   Time    __________________________________________ Nurse's Signature    

## 2012-10-02 NOTE — Patient Instructions (Signed)
Continue concurrent chemoradiation as scheduled. Followup visit in 2 weeks 

## 2012-10-02 NOTE — Progress Notes (Signed)
[facility] Telephone:([phone]) [phone]   Fax:(336) [phone]  OFFICE PROGRESS NOTE  Pcp Not In System No address on file  DIAGNOSIS: Stage IIIa non-small cell lung cancer, adenocarcinoma with negative EGFR mutation and negative ALK gene translocation diagnosed in March of 2014  PRIOR THERAPY: None  CURRENT THERAPY: Concurrent chemoradiation with weekly carboplatin for AUC of 2 and paclitaxel 45 mg/M2, status post 1 cycle.  INTERVAL HISTORY: Linda Donovan 57 y.o. female returns to the clinic today for followup visit accompanied her husband. The patient is feeling fine today with no specific complaints. She tolerated the first cycle of her concurrent chemoradiation fairly well with no significant adverse effects. The patient denied having any significant chest pain, shortness breath, cough or hemoptysis. She has no nausea or vomiting, no fever or chills. She has no significant weight loss or night sweats. She had MRI of the brain performed that showed no evidence for metastatic disease to the brain.  MEDICAL HISTORY: Past Medical History  Diagnosis Date  . COPD (chronic obstructive pulmonary disease)   . ADD (attention deficit disorder)   . Non-small cell lung cancer 09/18/2012    ALLERGIES:  is allergic to clinoril and morphine and related.  MEDICATIONS:  Current Outpatient Prescriptions  Medication Sig Dispense Refill  . albuterol (PROVENTIL HFA;VENTOLIN HFA) 108 (90 BASE) MCG/ACT inhaler Inhale 2 puffs into the lungs every 6 (six) hours as needed for wheezing.      Marland Kitchen amphetamine-dextroamphetamine (ADDERALL) 20 MG tablet Take 20 mg by mouth daily before breakfast.       . Ascorbic Acid (VITAMIN C) 1000 MG tablet Take 1,000 mg by mouth daily. Taking emergen-c powder      . aspirin 81 MG tablet Take 81 mg by mouth daily.      . calcium-vitamin D (OSCAL WITH D) 250-125 MG-UNIT per tablet Take 1 tablet by mouth daily.      Marland Kitchen loratadine (CLARITIN) 10 MG tablet Take 10  mg by mouth daily as needed.       . mometasone (NASONEX) 50 MCG/ACT nasal spray Place 2 sprays into the nose daily as needed.       . Multiple Vitamins-Minerals (MULTIVITAMIN WITH MINERALS) tablet Take 1 tablet by mouth daily.      . traZODone (DESYREL) 50 MG tablet Take 50 mg by mouth at bedtime.      . prochlorperazine (COMPAZINE) 10 MG tablet Take 1 tablet (10 mg total) by mouth every 6 (six) hours as needed.  60 tablet  0   No current facility-administered medications for this visit.    SURGICAL HISTORY:  Past Surgical History  Procedure Laterality Date  . Total abdominal hysterectomy    . Shoulder arthroscopy    . Video bronchoscopy Bilateral 07/25/2012    Procedure: VIDEO BRONCHOSCOPY WITH FLUORO;  Surgeon: Nyoka Cowden, MD;  Location: [facility];  Service: Endoscopy;  Laterality: Bilateral;  . Lump removed from breasr right  2003  . Endobronchial ultrasound Bilateral 09/04/2012    Procedure: ENDOBRONCHIAL ULTRASOUND;  Surgeon: Leslye Peer, MD;  Location: [facility];  Service: Cardiopulmonary;  Laterality: Bilateral;    REVIEW OF SYSTEMS:  A comprehensive review of systems was negative.   PHYSICAL EXAMINATION: General appearance: alert, cooperative and no distress Head: Normocephalic, without obvious abnormality, atraumatic Neck: no adenopathy Lymph nodes: Cervical, supraclavicular, and axillary nodes normal. Resp: clear to auscultation bilaterally Cardio: regular rate and rhythm, S1, S2 normal, no murmur, click, rub or gallop  GI: soft, non-tender; bowel sounds normal; no masses,  no organomegaly Extremities: extremities normal, atraumatic, no cyanosis or edema Neurologic: Alert and oriented X 3, normal strength and tone. Normal symmetric reflexes. Normal coordination and gait  ECOG PERFORMANCE STATUS: 0 - Asymptomatic  Blood pressure 156/93, pulse 107, temperature 97.7 F (36.5 C), temperature source Oral, resp. rate 18, height 5\' 3"  (1.6 m), weight 132 lb 4.8 oz  (60.011 kg).  LABORATORY DATA: Lab Results  Component Value Date   WBC 8.9 10/02/2012   HGB 14.8 10/02/2012   HCT 43.5 10/02/2012   MCV 94.4 10/02/2012   PLT 296 10/02/2012      Chemistry      Component Value Date/Time   NA 136 09/25/2012 0908   NA 138 09/04/2012 0815   K 4.0 09/25/2012 0908   K 3.4* 09/04/2012 0815   CL 104 09/25/2012 0908   CL 100 09/04/2012 0815   CO2 22 09/25/2012 0908   CO2 28 09/04/2012 0815   BUN 8.2 09/25/2012 0908   BUN 6 09/04/2012 0815   CREATININE 0.6 09/25/2012 0908   CREATININE 0.65 09/04/2012 0815      Component Value Date/Time   CALCIUM 9.5 09/25/2012 0908   CALCIUM 9.3 09/04/2012 0815   ALKPHOS 83 09/25/2012 0908   ALKPHOS 90 09/04/2012 0815   AST 25 09/25/2012 0908   AST 22 09/04/2012 0815   ALT 17 09/25/2012 0908   ALT 16 09/04/2012 0815   BILITOT 0.47 09/25/2012 0908   BILITOT 0.6 09/04/2012 0815       RADIOGRAPHIC STUDIES: Mr Laqueta Jean Wo Contrast  September 24, 2012  *RADIOLOGY REPORT*  Clinical Data: Lung cancer, staging  MRI HEAD WITHOUT AND WITH CONTRAST  Technique:  Multiplanar, multiecho pulse sequences of the brain and surrounding structures were obtained according to standard protocol without and with intravenous contrast  Contrast: 12mL MULTIHANCE GADOBENATE DIMEGLUMINE 529 MG/ML IV SOLN  Comparison: PET scan 08/04/2012.  Findings: There is no evidence for acute infarction, intracranial hemorrhage, mass lesion, hydrocephalus, or extra-axial fluid. There is no significant atrophy or white matter disease.  No evidence for remote cortical infarction.  Post infusion, no abnormal intracranial enhancement of the brain or meninges.  Major intracranial vascular structures patent. No osseous destructive lesion.  Extracranial soft tissues unremarkable.  No sinus or mastoid disease.  IMPRESSION: Unremarkable cranial MRI without and with contrast.  No evidence for metastatic disease to the brain or its coverings.   Original Report Authenticated By: Davonna Belling, M.D.      ASSESSMENT: This is a very pleasant 57 years old white female with stage IIIa non-small cell lung cancer, adenocarcinoma, currently undergoing concurrent chemoradiation with weekly carboplatin and paclitaxel. She is tolerating her treatment fairly well with no significant adverse effects.   PLAN: The patient is doing fine today.  I discussed with the patient the molecular study results as well as the MRI of the brain. We'll proceed with cycle #2 today as scheduled. The patient would come back for followup visit in 2 weeks for evaluation and management any adverse effect of her chemotherapy.  All questions were answered. The patient knows to call the clinic with any problems, questions or concerns. We can certainly see the patient much sooner if necessary.  I spent 15 minutes counseling the patient face to face. The total time spent in the appointment was 25 minutes.

## 2012-10-02 NOTE — Progress Notes (Signed)
I spoke with patient briefly at her request during chemotherapy. Patient is a 57 year old female diagnosed with non-small cell lung cancer. Her weight is within her usual range for her and weight was documented as 132.3 pounds today, April 28. Patient would like reassurance that she is eating the way she needs to during treatment.  Brief education on strategies for consuming calories and protein to meet estimated needs for weight maintenance. Patient educated on taste alterations and strategies for eating if throat begins to become sore. Fact sheets provided. Teach back method used. Contact information given.

## 2012-10-03 ENCOUNTER — Ambulatory Visit
Admission: RE | Admit: 2012-10-03 | Discharge: 2012-10-03 | Disposition: A | Discharge status: Home / Self Care | Payer: BC Managed Care – PPO | Source: Ambulatory Visit | Attending: Radiation Oncology | Admitting: Radiation Oncology

## 2012-10-03 VITALS — BP 153/91 | HR 91 | Temp 97.9°F | Ht 63.0 in | Wt 132.0 lb

## 2012-10-03 DIAGNOSIS — C3491 Malignant neoplasm of unspecified part of right bronchus or lung: Secondary | ICD-10-CM

## 2012-10-03 MED ORDER — SUCRALFATE 1 GM/10ML PO SUSP: 1.0000 g | Freq: Four times a day (QID) | ORAL | Status: DC

## 2012-10-03 NOTE — Progress Notes (Signed)
Ms. Nunley is here for weekly under treat visit.  She has had 4/35 fractions to her right upper lung.  She denies pain.  She does have fatigue.  She does have shortness of breast with activity.  Her oxygen saturation on room air was 97% today.  She is wondering if she can take zinc while having radiation.

## 2012-10-03 NOTE — Progress Notes (Signed)
[facility]    Radiation Oncology [address] [place]     Maryln Gottron, M.D. [place], [place] [address]               Linda Lade, M.D., Ph.D. Phone: [phone]      Molli Hazard A. Kathrynn Running, M.D. Fax: [phone]      Radene Gunning, M.D., Ph.D.         Lurline Hare, M.D.         Grayland Jack, M.D Weekly Treatment Management Note  Name: Linda Donovan     MRN: [id number]        CSN: [id number] Date: 10/03/2012      DOB: Mar 12, 1956  CC: Pcp Not In System         [name]    Status: Outpatient  Diagnosis: The encounter diagnosis was Non-small cell lung cancer, right.  Current Dose: 7.2 Gy  Current Fraction: 4  Planned Dose: 63 Gy  Narrative: Billey Donovan was seen today for weekly treatment management. The chart was checked and CBCT  were reviewed. She is tolerating the treatments well at this time without any significant side effects. She does complain of a poor taste for food but had this prior to starting any therapy. She denies any breathing problems.  Clinoril and Morphine and related Current Outpatient Prescriptions  Medication Sig Dispense Refill  . albuterol (PROVENTIL HFA;VENTOLIN HFA) 108 (90 BASE) MCG/ACT inhaler Inhale 2 puffs into the lungs every 6 (six) hours as needed for wheezing.      Marland Kitchen amphetamine-dextroamphetamine (ADDERALL) 20 MG tablet Take 20 mg by mouth daily before breakfast.       . Ascorbic Acid (VITAMIN C) 1000 MG tablet Take 1,000 mg by mouth daily. Taking emergen-c powder      . aspirin 81 MG tablet Take 81 mg by mouth daily.      . calcium-vitamin D (OSCAL WITH D) 250-125 MG-UNIT per tablet Take 1 tablet by mouth daily.      Marland Kitchen loratadine (CLARITIN) 10 MG tablet Take 10 mg by mouth daily as needed.       . mometasone (NASONEX) 50 MCG/ACT nasal spray Place 2 sprays into the nose daily as needed.       . Multiple Vitamins-Minerals (MULTIVITAMIN WITH MINERALS) tablet Take 1 tablet by mouth daily.      . traZODone (DESYREL) 50 MG  tablet Take 50 mg by mouth at bedtime.      . prochlorperazine (COMPAZINE) 10 MG tablet Take 1 tablet (10 mg total) by mouth every 6 (six) hours as needed.  60 tablet  0   No current facility-administered medications for this encounter.   Labs:  Lab Results  Component Value Date   WBC 8.9 10/02/2012   HGB 14.8 10/02/2012   HCT 43.5 10/02/2012   MCV 94.4 10/02/2012   PLT 296 10/02/2012   Lab Results  Component Value Date   CREATININE 0.7 10/02/2012   BUN 6.7* 10/02/2012   NA 137 10/02/2012   K 3.6 10/02/2012   CL 100 10/02/2012   CO2 27 10/02/2012   Lab Results  Component Value Date   ALT 18 10/02/2012   AST 22 10/02/2012   BILITOT 0.65 10/02/2012    Physical Examination:  height is 5\' 3"  (1.6 m) and weight is 132 lb (59.875 kg). Her temperature is 97.9 F (36.6 C). Her blood pressure is 153/91 and her pulse is 91. Her oxygen saturation is 97%.  Wt Readings from Last 3 Encounters:  10/03/12 132 lb (59.875 kg)  10/02/12 132 lb 4.8 oz (60.011 kg)  09/25/12 131 lb 9.6 oz (59.693 kg)    The oral cavity is moist without secondary infection. Lungs - Normal respiratory effort, chest expands symmetrically. Lungs are clear to auscultation, no crackles or wheezes.  Heart has regular rhythm and rate  Abdomen is soft and non tender with normal bowel sounds  Assessment:  Patient tolerating treatments well  Plan: Continue treatment per original radiation prescription.  She will be given a prescription for Carafate.

## 2012-10-04 ENCOUNTER — Ambulatory Visit
Admission: RE | Admit: 2012-10-04 | Discharge: 2012-10-04 | Disposition: A | Discharge status: Home / Self Care | Payer: BC Managed Care – PPO | Source: Ambulatory Visit | Attending: Radiation Oncology | Admitting: Radiation Oncology

## 2012-10-04 ENCOUNTER — Telehealth: Payer: Self-pay | Admitting: Radiation Oncology

## 2012-10-04 NOTE — Telephone Encounter (Signed)
Patient phoned requesting a new prescription to replace carafate since "it was so expensive." Patient explains that [employer] on [address] wants to charge her $144 for 30 day supple of carafate. Phoned [employer] at [phone] and spoke with [place]. Fraser Din explains that he has no insurance on file for this patient and that is why the script is so much. Phoned patient back requesting she contact [employer] and provide them her insurance information so the medication would be covered. She verbalized understanding.

## 2012-10-05 ENCOUNTER — Ambulatory Visit
Admission: RE | Admit: 2012-10-05 | Discharge: 2012-10-05 | Disposition: A | Discharge status: Home / Self Care | Payer: BC Managed Care – PPO | Source: Ambulatory Visit | Attending: Radiation Oncology | Admitting: Radiation Oncology

## 2012-10-06 ENCOUNTER — Ambulatory Visit
Admission: RE | Admit: 2012-10-06 | Discharge: 2012-10-06 | Disposition: A | Discharge status: Home / Self Care | Payer: BC Managed Care – PPO | Source: Ambulatory Visit | Attending: Radiation Oncology | Admitting: Radiation Oncology

## 2012-10-09 ENCOUNTER — Telehealth: Payer: Self-pay | Admitting: Dietician

## 2012-10-09 ENCOUNTER — Other Ambulatory Visit (HOSPITAL_BASED_OUTPATIENT_CLINIC_OR_DEPARTMENT_OTHER): Payer: BC Managed Care – PPO | Admitting: Lab

## 2012-10-09 ENCOUNTER — Ambulatory Visit
Admission: RE | Admit: 2012-10-09 | Discharge: 2012-10-09 | Disposition: A | Discharge status: Home / Self Care | Payer: BC Managed Care – PPO | Source: Ambulatory Visit | Attending: Radiation Oncology | Admitting: Radiation Oncology

## 2012-10-09 ENCOUNTER — Ambulatory Visit (HOSPITAL_BASED_OUTPATIENT_CLINIC_OR_DEPARTMENT_OTHER): Payer: BC Managed Care – PPO

## 2012-10-09 VITALS — BP 149/86 | HR 100 | Temp 98.1°F | Resp 20

## 2012-10-09 DIAGNOSIS — C3410 Malignant neoplasm of upper lobe, unspecified bronchus or lung: Secondary | ICD-10-CM

## 2012-10-09 DIAGNOSIS — C3491 Malignant neoplasm of unspecified part of right bronchus or lung: Secondary | ICD-10-CM

## 2012-10-09 DIAGNOSIS — Z5111 Encounter for antineoplastic chemotherapy: Secondary | ICD-10-CM

## 2012-10-09 LAB — COMPREHENSIVE METABOLIC PANEL (CC13)
ALT: 17 U/L (ref 0–55)
AST: 20 U/L (ref 5–34)
Albumin: 3.3 g/dL — ABNORMAL LOW (ref 3.5–5.0)
CO2: 26 mEq/L (ref 22–29)
Calcium: 8.7 mg/dL (ref 8.4–10.4)
Chloride: 105 mEq/L (ref 98–107)
Potassium: 3.8 mEq/L (ref 3.5–5.1)
Total Protein: 6.7 g/dL (ref 6.4–8.3)

## 2012-10-09 LAB — CBC WITH DIFFERENTIAL/PLATELET
BASO%: 0.4 % (ref 0.0–2.0)
Eosinophils Absolute: 0.1 10*3/uL (ref 0.0–0.5)
HCT: 43.5 % (ref 34.8–46.6)
MCHC: 34 g/dL (ref 31.5–36.0)
MONO#: 0.6 10*3/uL (ref 0.1–0.9)
NEUT#: 5.2 10*3/uL (ref 1.5–6.5)
NEUT%: 75.1 % (ref 38.4–76.8)
Platelets: 266 10*3/uL (ref 145–400)
RBC: 4.57 10*6/uL (ref 3.70–5.45)
WBC: 7 10*3/uL (ref 3.9–10.3)
lymph#: 1 10*3/uL (ref 0.9–3.3)
nRBC: 0 % (ref 0–0)

## 2012-10-09 MED ORDER — SODIUM CHLORIDE 0.9 % IV SOLN
45.0000 mg/m2 | Freq: Once | INTRAVENOUS | Status: AC
  Administered 2012-10-09: 72 mg via INTRAVENOUS
  Filled 2012-10-09: qty 12

## 2012-10-09 MED ORDER — ONDANSETRON 16 MG/50ML IVPB (CHCC)
16.0000 mg | Freq: Once | INTRAVENOUS | Status: AC
  Administered 2012-10-09: 16 mg via INTRAVENOUS

## 2012-10-09 MED ORDER — FAMOTIDINE IN NACL 20-0.9 MG/50ML-% IV SOLN
20.0000 mg | Freq: Once | INTRAVENOUS | Status: AC
  Administered 2012-10-09: 20 mg via INTRAVENOUS

## 2012-10-09 MED ORDER — SODIUM CHLORIDE 0.9 % IV SOLN
230.0000 mg | Freq: Once | INTRAVENOUS | Status: AC
  Administered 2012-10-09: 230 mg via INTRAVENOUS
  Filled 2012-10-09: qty 23

## 2012-10-09 MED ORDER — DEXAMETHASONE SODIUM PHOSPHATE 20 MG/5ML IJ SOLN
20.0000 mg | Freq: Once | INTRAMUSCULAR | Status: AC
  Administered 2012-10-09: 20 mg via INTRAVENOUS

## 2012-10-09 MED ORDER — DIPHENHYDRAMINE HCL 50 MG/ML IJ SOLN
50.0000 mg | Freq: Once | INTRAMUSCULAR | Status: AC
  Administered 2012-10-09: 50 mg via INTRAVENOUS

## 2012-10-09 MED ORDER — SODIUM CHLORIDE 0.9 % IV SOLN
Freq: Once | INTRAVENOUS | Status: AC
  Administered 2012-10-09: 14:00:00 via INTRAVENOUS

## 2012-10-09 NOTE — Patient Instructions (Addendum)
[facility] Discharge Instructions for Patients Receiving Chemotherapy  Today you received the following chemotherapy agents Taxol and Carboplatin.  To help prevent nausea and vomiting after your treatment, we encourage you to take your nausea medication as prescribed. .   If you develop nausea and vomiting that is not controlled by your nausea medication, call the clinic. If it is after clinic hours your family physician or the after hours number for the clinic or go to the Emergency Department.   BELOW ARE SYMPTOMS THAT SHOULD BE REPORTED IMMEDIATELY:  *FEVER GREATER THAN 100.5 F  *CHILLS WITH OR WITHOUT FEVER  NAUSEA AND VOMITING THAT IS NOT CONTROLLED WITH YOUR NAUSEA MEDICATION  *UNUSUAL SHORTNESS OF BREATH  *UNUSUAL BRUISING OR BLEEDING  TENDERNESS IN MOUTH AND THROAT WITH OR WITHOUT PRESENCE OF ULCERS  *URINARY PROBLEMS  *BOWEL PROBLEMS  UNUSUAL RASH Items with * indicate a potential emergency and should be followed up as soon as possible.  Please let the nurse know about any problems that you may have experienced. Feel free to call the clinic you have any questions or concerns. The clinic phone number is [phone].   I have been informed and understand all the instructions given to me. I know to contact the clinic, my physician, or go to the Emergency Department if any problems should occur. I do not have any questions at this time, but understand that I may call the clinic during office hours   should I have any questions or need assistance in obtaining follow up care.    __________________________________________  _____________  __________ Signature of Patient or Authorized Representative            Date                   Time    __________________________________________ Nurse's Signature    

## 2012-10-10 ENCOUNTER — Ambulatory Visit
Admission: RE | Admit: 2012-10-10 | Discharge: 2012-10-10 | Disposition: A | Discharge status: Home / Self Care | Payer: BC Managed Care – PPO | Source: Ambulatory Visit | Attending: Radiation Oncology | Admitting: Radiation Oncology

## 2012-10-10 VITALS — BP 151/95 | HR 102 | Temp 98.6°F | Wt 134.5 lb

## 2012-10-10 DIAGNOSIS — C3491 Malignant neoplasm of unspecified part of right bronchus or lung: Secondary | ICD-10-CM

## 2012-10-10 MED ORDER — RADIAPLEXRX EX GEL
Freq: Once | CUTANEOUS | Status: AC
  Administered 2012-10-10: 18:00:00 via TOPICAL

## 2012-10-10 NOTE — Progress Notes (Signed)
[facility]    Radiation Oncology [address]     Maryln Gottron, M.D. [place], [place] [address]               Billie Lade, M.D., Ph.D. Phone: [phone]      Molli Hazard A. Kathrynn Running, M.D. Fax: [phone]      Radene Gunning, M.D., Ph.D.         Lurline Hare, M.D.         Grayland Jack, M.D Weekly Treatment Management Note  Name: Linda Donovan     MRN: [id number]        CSN: [id number] Date: 10/10/2012      DOB: 1955/07/30  CC: Pcp Not In System         [name]    Status: Outpatient  Diagnosis: The encounter diagnosis was Non-small cell lung cancer, right.  Current Dose: 16.2 Gy  Current Fraction: 9  Planned Dose: 63 Gy  Narrative: [name] was seen today for weekly treatment management. The chart was checked and CBCT  were reviewed. She is tolerating her radiation therapy well without any swallowing difficulties. Her breathing is stable. She did have development of a rash today after receiving additional chemotherapy yesterday. Patient was instructed to use Benadryl and to let us know if her rash worsens. I did discuss this issue with Dr. Arbutus Ped this afternoon.  Clinoril and Morphine and related Current Outpatient Prescriptions  Medication Sig Dispense Refill  . albuterol (PROVENTIL HFA;VENTOLIN HFA) 108 (90 BASE) MCG/ACT inhaler Inhale 2 puffs into the lungs every 6 (six) hours as needed for wheezing.      Marland Kitchen amphetamine-dextroamphetamine (ADDERALL) 20 MG tablet Take 20 mg by mouth daily before breakfast.       . Ascorbic Acid (VITAMIN C) 1000 MG tablet Take 1,000 mg by mouth daily. Taking emergen-c powder      . aspirin 81 MG tablet Take 81 mg by mouth daily.      . calcium-vitamin D (OSCAL WITH D) 250-125 MG-UNIT per tablet Take 1 tablet by mouth daily.      Marland Kitchen loratadine (CLARITIN) 10 MG tablet Take 10 mg by mouth daily as needed.       . mometasone (NASONEX) 50 MCG/ACT nasal spray Place 2 sprays into the nose daily as needed.       .  Multiple Vitamins-Minerals (MULTIVITAMIN WITH MINERALS) tablet Take 1 tablet by mouth daily.      . traZODone (DESYREL) 50 MG tablet Take 50 mg by mouth at bedtime.      Marland Kitchen zinc gluconate 50 MG tablet Take 50 mg by mouth daily.      . hyaluronate sodium (RADIAPLEXRX) GEL Apply 1 application topically 2 (two) times daily.      . prochlorperazine (COMPAZINE) 10 MG tablet Take 1 tablet (10 mg total) by mouth every 6 (six) hours as needed.  60 tablet  0  . sucralfate (CARAFATE) 1 GM/10ML suspension Take 10 mLs (1 g total) by mouth 4 (four) times daily.  420 mL  2   No current facility-administered medications for this encounter.   Labs:  Lab Results  Component Value Date   WBC 7.0 10/09/2012   HGB 14.8 10/09/2012   HCT 43.5 10/09/2012   MCV 95.2 10/09/2012   PLT 266 10/09/2012   Lab Results  Component Value Date   CREATININE 0.6 10/09/2012   BUN 8.0 10/09/2012   NA 138 10/09/2012   K 3.8 10/09/2012  CL 105 10/09/2012   CO2 26 10/09/2012   Lab Results  Component Value Date   ALT 17 10/09/2012   AST 20 10/09/2012   BILITOT 0.69 10/09/2012    Physical Examination:  weight is 134 lb 8 oz (61.009 kg). Her temperature is 98.6 F (37 C). Her blood pressure is 151/95 and her pulse is 102. Her oxygen saturation is 97%.    Wt Readings from Last 3 Encounters:  10/10/12 134 lb 8 oz (61.009 kg)  10/03/12 132 lb (59.875 kg)  10/02/12 132 lb 4.8 oz (60.011 kg)     Lungs - Normal respiratory effort, chest expands symmetrically. Lungs are clear to auscultation, no crackles or wheezes.  Heart has regular rhythm and rate  Abdomen is soft and non tender with normal bowel sounds The trunk area shows a macular rash throughout.  Assessment:  Patient tolerating treatments well  Plan: Continue treatment per original radiation prescription

## 2012-10-10 NOTE — Progress Notes (Signed)
Linda Donovan here with her husband for weekly under treat visit.  She has had 9/35 fractions to her right lung.  She denies pain, fatigue and nausea.  She does report shortness of breath with activity.  She also has a scattered, red rash over her abdomen and back.  She was given the Radiation Therapy and you book and was educated on the potential side effects of radiation including fatigue, skin changes and throat changes.  She was also given radiaplex gel and instructed to use it twice a day with the first application at least 4 hours before treatment.

## 2012-10-11 ENCOUNTER — Ambulatory Visit
Admission: RE | Admit: 2012-10-11 | Discharge: 2012-10-11 | Disposition: A | Discharge status: Home / Self Care | Payer: BC Managed Care – PPO | Source: Ambulatory Visit | Attending: Radiation Oncology | Admitting: Radiation Oncology

## 2012-10-12 ENCOUNTER — Ambulatory Visit
Admission: RE | Admit: 2012-10-12 | Discharge: 2012-10-12 | Disposition: A | Discharge status: Home / Self Care | Payer: BC Managed Care – PPO | Source: Ambulatory Visit | Attending: Radiation Oncology | Admitting: Radiation Oncology

## 2012-10-13 ENCOUNTER — Ambulatory Visit
Admission: RE | Admit: 2012-10-13 | Discharge: 2012-10-13 | Disposition: A | Discharge status: Home / Self Care | Payer: BC Managed Care – PPO | Source: Ambulatory Visit | Attending: Radiation Oncology | Admitting: Radiation Oncology

## 2012-10-14 ENCOUNTER — Ambulatory Visit
Admission: RE | Admit: 2012-10-14 | Discharge: 2012-10-14 | Disposition: A | Discharge status: Home / Self Care | Payer: BC Managed Care – PPO | Source: Ambulatory Visit | Attending: Radiation Oncology | Admitting: Radiation Oncology

## 2012-10-16 ENCOUNTER — Ambulatory Visit
Admission: RE | Admit: 2012-10-16 | Discharge: 2012-10-16 | Disposition: A | Discharge status: Home / Self Care | Payer: BC Managed Care – PPO | Source: Ambulatory Visit | Attending: Radiation Oncology | Admitting: Radiation Oncology

## 2012-10-16 ENCOUNTER — Other Ambulatory Visit (HOSPITAL_BASED_OUTPATIENT_CLINIC_OR_DEPARTMENT_OTHER): Payer: BC Managed Care – PPO | Admitting: Lab

## 2012-10-16 ENCOUNTER — Ambulatory Visit (HOSPITAL_BASED_OUTPATIENT_CLINIC_OR_DEPARTMENT_OTHER): Payer: BC Managed Care – PPO

## 2012-10-16 ENCOUNTER — Other Ambulatory Visit: Payer: BC Managed Care – PPO | Admitting: Lab

## 2012-10-16 ENCOUNTER — Ambulatory Visit (HOSPITAL_BASED_OUTPATIENT_CLINIC_OR_DEPARTMENT_OTHER): Payer: BC Managed Care – PPO | Admitting: Internal Medicine

## 2012-10-16 ENCOUNTER — Encounter: Payer: Self-pay | Admitting: Internal Medicine

## 2012-10-16 VITALS — BP 144/80 | HR 101 | Temp 96.7°F | Resp 97 | Ht 63.0 in | Wt 132.3 lb

## 2012-10-16 DIAGNOSIS — Z5111 Encounter for antineoplastic chemotherapy: Secondary | ICD-10-CM

## 2012-10-16 DIAGNOSIS — C3410 Malignant neoplasm of upper lobe, unspecified bronchus or lung: Secondary | ICD-10-CM

## 2012-10-16 DIAGNOSIS — C3491 Malignant neoplasm of unspecified part of right bronchus or lung: Secondary | ICD-10-CM

## 2012-10-16 LAB — COMPREHENSIVE METABOLIC PANEL (CC13)
ALT: 21 U/L (ref 0–55)
Albumin: 3.8 g/dL (ref 3.5–5.0)
CO2: 26 mEq/L (ref 22–29)
Calcium: 9.3 mg/dL (ref 8.4–10.4)
Chloride: 102 mEq/L (ref 98–107)
Potassium: 3.9 mEq/L (ref 3.5–5.1)
Sodium: 137 mEq/L (ref 136–145)
Total Protein: 7.5 g/dL (ref 6.4–8.3)

## 2012-10-16 LAB — CBC WITH DIFFERENTIAL/PLATELET
BASO%: 0.7 % (ref 0.0–2.0)
Basophils Absolute: 0.1 10*3/uL (ref 0.0–0.1)
EOS%: 1.5 % (ref 0.0–7.0)
HGB: 15.1 g/dL (ref 11.6–15.9)
MCH: 32.5 pg (ref 25.1–34.0)
MCV: 94.6 fL (ref 79.5–101.0)
MONO%: 8.7 % (ref 0.0–14.0)
RBC: 4.64 10*6/uL (ref 3.70–5.45)
RDW: 12.7 % (ref 11.2–14.5)
lymph#: 1 10*3/uL (ref 0.9–3.3)
nRBC: 0 % (ref 0–0)

## 2012-10-16 MED ORDER — DIPHENHYDRAMINE HCL 50 MG/ML IJ SOLN
50.0000 mg | Freq: Once | INTRAMUSCULAR | Status: AC
  Administered 2012-10-16: 50 mg via INTRAVENOUS

## 2012-10-16 MED ORDER — SODIUM CHLORIDE 0.9 % IV SOLN
45.0000 mg/m2 | Freq: Once | INTRAVENOUS | Status: AC
  Administered 2012-10-16: 72 mg via INTRAVENOUS
  Filled 2012-10-16: qty 12

## 2012-10-16 MED ORDER — SODIUM CHLORIDE 0.9 % IV SOLN
Freq: Once | INTRAVENOUS | Status: AC
  Administered 2012-10-16: 13:00:00 via INTRAVENOUS

## 2012-10-16 MED ORDER — SODIUM CHLORIDE 0.9 % IV SOLN
226.0000 mg | Freq: Once | INTRAVENOUS | Status: AC
  Administered 2012-10-16: 230 mg via INTRAVENOUS
  Filled 2012-10-16: qty 23

## 2012-10-16 MED ORDER — FAMOTIDINE IN NACL 20-0.9 MG/50ML-% IV SOLN
20.0000 mg | Freq: Once | INTRAVENOUS | Status: AC
  Administered 2012-10-16: 20 mg via INTRAVENOUS

## 2012-10-16 MED ORDER — ONDANSETRON 16 MG/50ML IVPB (CHCC)
16.0000 mg | Freq: Once | INTRAVENOUS | Status: AC
  Administered 2012-10-16: 16 mg via INTRAVENOUS

## 2012-10-16 MED ORDER — DEXAMETHASONE SODIUM PHOSPHATE 20 MG/5ML IJ SOLN
20.0000 mg | Freq: Once | INTRAMUSCULAR | Status: AC
  Administered 2012-10-16: 20 mg via INTRAVENOUS

## 2012-10-16 NOTE — Progress Notes (Signed)
[facility] Telephone:(336) [phone]   Fax:(336) [phone]  OFFICE PROGRESS NOTE  DIAGNOSIS: Stage IIIa non-small cell lung cancer, adenocarcinoma with negative EGFR mutation and negative ALK gene translocation diagnosed in March of 2014   PRIOR THERAPY: None   CURRENT THERAPY: Concurrent chemoradiation with weekly carboplatin for AUC of 2 and paclitaxel 45 mg/M2, status post 3 cycles.  INTERVAL HISTORY: Linda Donovan 57 y.o. female returns to the clinic today for followup visit accompanied her daughter. The patient escalating her treatment with concurrent chemoradiation fairly well with no significant complaints except for mild sore throat started recently. She denied having any significant chest pain, shortness breath, cough or hemoptysis. The patient denied having any weight loss or night sweats. She has no nausea or vomiting, no peripheral neuropathy.  MEDICAL HISTORY: Past Medical History  Diagnosis Date  . COPD (chronic obstructive pulmonary disease)   . ADD (attention deficit disorder)   . Non-small cell lung cancer 09/18/2012    ALLERGIES:  is allergic to clinoril and morphine and related.  MEDICATIONS:  Current Outpatient Prescriptions  Medication Sig Dispense Refill  . albuterol (PROVENTIL HFA;VENTOLIN HFA) 108 (90 BASE) MCG/ACT inhaler Inhale 2 puffs into the lungs every 6 (six) hours as needed for wheezing.      Marland Kitchen amphetamine-dextroamphetamine (ADDERALL) 20 MG tablet Take 20 mg by mouth daily before breakfast.       . Ascorbic Acid (VITAMIN C) 1000 MG tablet Take 1,000 mg by mouth daily. Taking emergen-c powder      . aspirin 81 MG tablet Take 81 mg by mouth daily.      . calcium-vitamin D (OSCAL WITH D) 250-125 MG-UNIT per tablet Take 1 tablet by mouth daily.      . hyaluronate sodium (RADIAPLEXRX) GEL Apply 1 application topically 2 (two) times daily.      Marland Kitchen loratadine (CLARITIN) 10 MG tablet Take 10 mg by mouth daily as needed.       . mometasone  (NASONEX) 50 MCG/ACT nasal spray Place 2 sprays into the nose daily as needed.       . Multiple Vitamins-Minerals (MULTIVITAMIN WITH MINERALS) tablet Take 1 tablet by mouth daily.      . sucralfate (CARAFATE) 1 GM/10ML suspension Take 10 mLs (1 g total) by mouth 4 (four) times daily.  420 mL  2  . traZODone (DESYREL) 50 MG tablet Take 50 mg by mouth at bedtime.      Marland Kitchen zinc gluconate 50 MG tablet Take 50 mg by mouth daily.      . prochlorperazine (COMPAZINE) 10 MG tablet Take 1 tablet (10 mg total) by mouth every 6 (six) hours as needed.  60 tablet  0   No current facility-administered medications for this visit.    SURGICAL HISTORY:  Past Surgical History  Procedure Laterality Date  . Total abdominal hysterectomy    . Shoulder arthroscopy    . Video bronchoscopy Bilateral 07/25/2012    Procedure: VIDEO BRONCHOSCOPY WITH FLUORO;  Surgeon: Nyoka Cowden, MD;  Location: [facility];  Service: Endoscopy;  Laterality: Bilateral;  . Lump removed from breasr right  2003  . Endobronchial ultrasound Bilateral 09/04/2012    Procedure: ENDOBRONCHIAL ULTRASOUND;  Surgeon: Leslye Peer, MD;  Location: [facility];  Service: Cardiopulmonary;  Laterality: Bilateral;    REVIEW OF SYSTEMS:  A comprehensive review of systems was negative except for: Ears, nose, mouth, throat, and face: positive for sore throat   PHYSICAL EXAMINATION: General appearance:  alert, cooperative and no distress Head: Normocephalic, without obvious abnormality, atraumatic Neck: no adenopathy Lymph nodes: Cervical, supraclavicular, and axillary nodes normal. Resp: clear to auscultation bilaterally Cardio: regular rate and rhythm, S1, S2 normal, no murmur, click, rub or gallop GI: soft, non-tender; bowel sounds normal; no masses,  no organomegaly Extremities: extremities normal, atraumatic, no cyanosis or edema  ECOG PERFORMANCE STATUS: 1 - Symptomatic but completely ambulatory  Blood pressure 144/80, pulse 101,  temperature 96.7 F (35.9 C), temperature source Oral, resp. rate 97, height 5\' 3"  (1.6 m), weight 132 lb 4.8 oz (60.011 kg).  LABORATORY DATA: Lab Results  Component Value Date   WBC 6.9 10/16/2012   HGB 15.1 10/16/2012   HCT 43.9 10/16/2012   MCV 94.6 10/16/2012   PLT 267 10/16/2012      Chemistry      Component Value Date/Time   NA 138 10/09/2012 1238   NA 138 09/04/2012 0815   K 3.8 10/09/2012 1238   K 3.4* 09/04/2012 0815   CL 105 10/09/2012 1238   CL 100 09/04/2012 0815   CO2 26 10/09/2012 1238   CO2 28 09/04/2012 0815   BUN 8.0 10/09/2012 1238   BUN 6 09/04/2012 0815   CREATININE 0.6 10/09/2012 1238   CREATININE 0.65 09/04/2012 0815      Component Value Date/Time   CALCIUM 8.7 10/09/2012 1238   CALCIUM 9.3 09/04/2012 0815   ALKPHOS 83 10/09/2012 1238   ALKPHOS 90 09/04/2012 0815   AST 20 10/09/2012 1238   AST 22 09/04/2012 0815   ALT 17 10/09/2012 1238   ALT 16 09/04/2012 0815   BILITOT 0.69 10/09/2012 1238   BILITOT 0.6 09/04/2012 0815       RADIOGRAPHIC STUDIES: No results found.  ASSESSMENT: This is a very pleasant 56 years old white female with stage IIIa non-small cell lung cancer currently undergoing concurrent chemoradiation with weekly carboplatin and paclitaxel is status post 3 weekly doses.   PLAN: The patient is tolerating her treatment fairly well. We'll proceed with cycle #4 today as scheduled. The patient would come back for followup visit in 2 weeks for reevaluation and management any adverse effect of her treatment. She was advised to call immediately if she has any concerning symptoms in the interval.  All questions were answered. The patient knows to call the clinic with any problems, questions or concerns. We can certainly see the patient much sooner if necessary.

## 2012-10-16 NOTE — Patient Instructions (Addendum)
[facility] Discharge Instructions for Patients Receiving Chemotherapy  Today you received the following chemotherapy agents Taxol/Carboplatin.  To help prevent nausea and vomiting after your treatment, we encourage you to take your nausea medication as directed.   If you develop nausea and vomiting that is not controlled by your nausea medication, call the clinic. If it is after clinic hours your family physician or the after hours number for the clinic or go to the Emergency Department.   BELOW ARE SYMPTOMS THAT SHOULD BE REPORTED IMMEDIATELY:  *FEVER GREATER THAN 100.5 F  *CHILLS WITH OR WITHOUT FEVER  NAUSEA AND VOMITING THAT IS NOT CONTROLLED WITH YOUR NAUSEA MEDICATION  *UNUSUAL SHORTNESS OF BREATH  *UNUSUAL BRUISING OR BLEEDING  TENDERNESS IN MOUTH AND THROAT WITH OR WITHOUT PRESENCE OF ULCERS  *URINARY PROBLEMS  *BOWEL PROBLEMS  UNUSUAL RASH Items with * indicate a potential emergency and should be followed up as soon as possible.  Feel free to call the clinic you have any questions or concerns. The clinic phone number is [phone].

## 2012-10-16 NOTE — Patient Instructions (Signed)
Continue concurrent chemoradiation as scheduled. Followup in 2 weeks

## 2012-10-17 ENCOUNTER — Ambulatory Visit
Admission: RE | Admit: 2012-10-17 | Discharge: 2012-10-17 | Disposition: A | Discharge status: Home / Self Care | Payer: BC Managed Care – PPO | Source: Ambulatory Visit | Attending: Radiation Oncology | Admitting: Radiation Oncology

## 2012-10-17 ENCOUNTER — Telehealth: Payer: Self-pay | Admitting: *Deleted

## 2012-10-17 VITALS — BP 131/86 | HR 95 | Temp 97.8°F | Ht 63.0 in | Wt 134.5 lb

## 2012-10-17 DIAGNOSIS — C3491 Malignant neoplasm of unspecified part of right bronchus or lung: Secondary | ICD-10-CM

## 2012-10-17 MED ORDER — HYDROCODONE-ACETAMINOPHEN 7.5-325 MG/15ML PO SOLN: 15.0000 mL | ORAL | Status: DC | PRN

## 2012-10-17 NOTE — Progress Notes (Signed)
[facility]    Radiation Oncology [address] [place]     Maryln Gottron, M.D. [place], [place] [address]               Billie Lade, M.D., Ph.D. Phone: [phone]      Molli Hazard A. Kathrynn Running, M.D. Fax: [phone]      Radene Gunning, M.D., Ph.D.         Lurline Hare, M.D.         Grayland Jack, M.D Weekly Treatment Management Note  Name: Linda Donovan     MRN: [id number]        CSN: [id number] Date: 10/17/2012      DOB: 1955-06-26  CC: Pcp Not In System         Linda Donovan    Status: Outpatient  Diagnosis: The encounter diagnosis was Non-small cell lung cancer, right. stage IIIa  Current Dose: 25.2 Gy  Current Fraction: 14  Planned Dose: 63 Gy  Narrative: Linda Donovan was seen today for weekly treatment management. The chart was checked and CBCT  were reviewed. She is starting to have more esophageal problems. She has Carafate and I recommended she uses on a regular basis. Also given her a prescription for Hycet.  She denies any nausea or breathing problems.  Clinoril and Morphine and related  Current Outpatient Prescriptions  Medication Sig Dispense Refill  . albuterol (PROVENTIL HFA;VENTOLIN HFA) 108 (90 BASE) MCG/ACT inhaler Inhale 2 puffs into the lungs every 6 (six) hours as needed for wheezing.      Marland Kitchen amphetamine-dextroamphetamine (ADDERALL) 20 MG tablet Take 20 mg by mouth daily before breakfast.       . Ascorbic Acid (VITAMIN C) 1000 MG tablet Take 1,000 mg by mouth daily. Taking emergen-c powder      . aspirin 81 MG tablet Take 81 mg by mouth daily.      . calcium-vitamin D (OSCAL WITH D) 250-125 MG-UNIT per tablet Take 1 tablet by mouth daily.      . hyaluronate sodium (RADIAPLEXRX) GEL Apply 1 application topically 2 (two) times daily.      Marland Kitchen loratadine (CLARITIN) 10 MG tablet Take 10 mg by mouth daily as needed.       . mometasone (NASONEX) 50 MCG/ACT nasal spray Place 2 sprays into the nose daily as needed.       . Multiple Vitamins-Minerals  (MULTIVITAMIN WITH MINERALS) tablet Take 1 tablet by mouth daily.      . sucralfate (CARAFATE) 1 GM/10ML suspension Take 10 mLs (1 g total) by mouth 4 (four) times daily.  420 mL  2  . traZODone (DESYREL) 50 MG tablet Take 50 mg by mouth at bedtime.      Marland Kitchen zinc gluconate 50 MG tablet Take 50 mg by mouth daily.      Marland Kitchen HYDROcodone-acetaminophen (HYCET) 7.5-325 mg/15 ml solution Take 15 mLs by mouth every 4 (four) hours as needed for pain.  120 mL  0  . prochlorperazine (COMPAZINE) 10 MG tablet Take 1 tablet (10 mg total) by mouth every 6 (six) hours as needed.  60 tablet  0   No current facility-administered medications for this encounter.   Labs:  Lab Results  Component Value Date   WBC 6.9 10/16/2012   HGB 15.1 10/16/2012   HCT 43.9 10/16/2012   MCV 94.6 10/16/2012   PLT 267 10/16/2012   Lab Results  Component Value Date   CREATININE 0.6 10/16/2012   BUN 8.9 10/16/2012  NA 137 10/16/2012   K 3.9 10/16/2012   CL 102 10/16/2012   CO2 26 10/16/2012   Lab Results  Component Value Date   ALT 21 10/16/2012   AST 19 10/16/2012   BILITOT 0.49 10/16/2012    Physical Examination:  height is 5\' 3"  (1.6 m) and weight is 134 lb 8 oz (61.009 kg). Her temperature is 97.8 F (36.6 C). Her blood pressure is 131/86 and her pulse is 95.    Wt Readings from Last 3 Encounters:  10/17/12 134 lb 8 oz (61.009 kg)  10/16/12 132 lb 4.8 oz (60.011 kg)  10/10/12 134 lb 8 oz (61.009 kg)    The oral cavity is moist without secondary infection Lungs - Normal respiratory effort, chest expands symmetrically. Lungs are clear to auscultation, no crackles or wheezes.  Heart has regular rhythm and rate  Abdomen is soft and non tender with normal bowel sounds  Assessment:  Patient tolerating treatments well except for issues as above  Plan: Continue treatment per original radiation prescription

## 2012-10-17 NOTE — Telephone Encounter (Signed)
Per staff message and POF I have scheduled appts.  JMW  

## 2012-10-17 NOTE — Progress Notes (Signed)
Linda Donovan here for weekly under treat visit.  She has had 14/35 fractions to her right upper lung/mediastinal area.  She does have fatigue.  She denies pain but states she has occasional pain with swallowing.  She just started taking the Carafate and says she doesn't always remember to take it.  She has had three bloody noses.  The last one happened yesterday.  Her skin is red on her upper back.  She states the rash is gone from last week.  She is using radiaplex gell.  She denies a cough and shortness of breath.

## 2012-10-18 ENCOUNTER — Ambulatory Visit
Admission: RE | Admit: 2012-10-18 | Discharge: 2012-10-18 | Disposition: A | Discharge status: Home / Self Care | Payer: BC Managed Care – PPO | Source: Ambulatory Visit | Attending: Radiation Oncology | Admitting: Radiation Oncology

## 2012-10-18 ENCOUNTER — Telehealth: Payer: Self-pay | Admitting: Internal Medicine

## 2012-10-18 NOTE — Telephone Encounter (Signed)
lvmf or pt regarding to appt sched and pick up new sched at nxt visit

## 2012-10-19 ENCOUNTER — Ambulatory Visit
Admission: RE | Admit: 2012-10-19 | Discharge: 2012-10-19 | Disposition: A | Discharge status: Home / Self Care | Payer: BC Managed Care – PPO | Source: Ambulatory Visit | Attending: Radiation Oncology | Admitting: Radiation Oncology

## 2012-10-20 ENCOUNTER — Ambulatory Visit
Admission: RE | Admit: 2012-10-20 | Discharge: 2012-10-20 | Disposition: A | Discharge status: Home / Self Care | Payer: BC Managed Care – PPO | Source: Ambulatory Visit | Attending: Radiation Oncology | Admitting: Radiation Oncology

## 2012-10-23 ENCOUNTER — Ambulatory Visit: Payer: BC Managed Care – PPO

## 2012-10-23 ENCOUNTER — Other Ambulatory Visit: Payer: BC Managed Care – PPO | Admitting: Lab

## 2012-10-24 ENCOUNTER — Ambulatory Visit (HOSPITAL_BASED_OUTPATIENT_CLINIC_OR_DEPARTMENT_OTHER): Payer: BC Managed Care – PPO

## 2012-10-24 ENCOUNTER — Ambulatory Visit
Admission: RE | Admit: 2012-10-24 | Discharge: 2012-10-24 | Disposition: A | Discharge status: Home / Self Care | Payer: BC Managed Care – PPO | Source: Ambulatory Visit | Attending: Radiation Oncology | Admitting: Radiation Oncology

## 2012-10-24 ENCOUNTER — Other Ambulatory Visit: Payer: Self-pay | Admitting: Internal Medicine

## 2012-10-24 ENCOUNTER — Other Ambulatory Visit (HOSPITAL_BASED_OUTPATIENT_CLINIC_OR_DEPARTMENT_OTHER): Payer: BC Managed Care – PPO | Admitting: Lab

## 2012-10-24 VITALS — BP 154/93 | HR 111 | Temp 98.4°F

## 2012-10-24 VITALS — BP 145/76 | HR 110 | Temp 98.3°F | Ht 63.0 in | Wt 136.9 lb

## 2012-10-24 DIAGNOSIS — C3491 Malignant neoplasm of unspecified part of right bronchus or lung: Secondary | ICD-10-CM

## 2012-10-24 DIAGNOSIS — C3410 Malignant neoplasm of upper lobe, unspecified bronchus or lung: Secondary | ICD-10-CM

## 2012-10-24 DIAGNOSIS — Z5111 Encounter for antineoplastic chemotherapy: Secondary | ICD-10-CM

## 2012-10-24 LAB — COMPREHENSIVE METABOLIC PANEL (CC13)
AST: 24 U/L (ref 5–34)
Albumin: 3.5 g/dL (ref 3.5–5.0)
Alkaline Phosphatase: 80 U/L (ref 40–150)
Potassium: 3.7 mEq/L (ref 3.5–5.1)
Sodium: 139 mEq/L (ref 136–145)
Total Bilirubin: 0.89 mg/dL (ref 0.20–1.20)
Total Protein: 6.8 g/dL (ref 6.4–8.3)

## 2012-10-24 LAB — CBC WITH DIFFERENTIAL/PLATELET
BASO%: 0.5 % (ref 0.0–2.0)
EOS%: 1.7 % (ref 0.0–7.0)
HGB: 13.3 g/dL (ref 11.6–15.9)
MCH: 32.4 pg (ref 25.1–34.0)
MCHC: 34 g/dL (ref 31.5–36.0)
MCV: 95.4 fL (ref 79.5–101.0)
MONO%: 11.2 % (ref 0.0–14.0)
RBC: 4.1 10*6/uL (ref 3.70–5.45)
RDW: 13.4 % (ref 11.2–14.5)
lymph#: 0.9 10*3/uL (ref 0.9–3.3)

## 2012-10-24 MED ORDER — ONDANSETRON 16 MG/50ML IVPB (CHCC)
16.0000 mg | Freq: Once | INTRAVENOUS | Status: AC
  Administered 2012-10-24: 16 mg via INTRAVENOUS

## 2012-10-24 MED ORDER — SODIUM CHLORIDE 0.9 % IV SOLN
Freq: Once | INTRAVENOUS | Status: AC
  Administered 2012-10-24: 14:00:00 via INTRAVENOUS

## 2012-10-24 MED ORDER — SODIUM CHLORIDE 0.9 % IV SOLN
45.0000 mg/m2 | Freq: Once | INTRAVENOUS | Status: AC
  Administered 2012-10-24: 72 mg via INTRAVENOUS
  Filled 2012-10-24: qty 12

## 2012-10-24 MED ORDER — DIPHENHYDRAMINE HCL 50 MG/ML IJ SOLN
50.0000 mg | Freq: Once | INTRAMUSCULAR | Status: AC
  Administered 2012-10-24: 50 mg via INTRAVENOUS

## 2012-10-24 MED ORDER — DEXAMETHASONE SODIUM PHOSPHATE 20 MG/5ML IJ SOLN
20.0000 mg | Freq: Once | INTRAMUSCULAR | Status: AC
  Administered 2012-10-24: 20 mg via INTRAVENOUS

## 2012-10-24 MED ORDER — FAMOTIDINE IN NACL 20-0.9 MG/50ML-% IV SOLN
20.0000 mg | Freq: Once | INTRAVENOUS | Status: AC
  Administered 2012-10-24: 20 mg via INTRAVENOUS

## 2012-10-24 MED ORDER — SODIUM CHLORIDE 0.9 % IV SOLN
226.0000 mg | Freq: Once | INTRAVENOUS | Status: AC
  Administered 2012-10-24: 230 mg via INTRAVENOUS
  Filled 2012-10-24: qty 23

## 2012-10-24 NOTE — Progress Notes (Signed)
Ms. Dolle here for her weekly under treat visit.  She has had 18/35 fractions to her right upper lung/mediastinal area.  She denies pain.  She does have shortness of breath occasionally.  Her oxygen saturation was 92% on room air.  She does have fatigue.  Her skin is intact with a pink area on her upper back.  She uses Radiaplex gel twice a day.

## 2012-10-24 NOTE — Progress Notes (Signed)
  Radiation Oncology         [phone] ________________________________  Name: Linda Donovan MRN: [id number]  Date: 10/24/2012  DOB: 03/03/1956  Weekly Radiation Therapy Management  Current Dose: 32.4 Gy     Planned Dose:  63 Gy  Narrative . . . . . . . . The patient presents for routine under treatment assessment.                                                     The patient is without complaint. She has minimal fatigue at this time. She denies any significant swallowing difficulties.                                 Set-up films were reviewed.                                 The chart was checked. Physical Findings. . .  height is 5\' 3"  (1.6 m) and weight is 136 lb 14.4 oz (62.097 kg). Her temperature is 98.3 F (36.8 C). Her blood pressure is 145/76 and her pulse is 110. Her oxygen saturation is 92%. . Weight essentially stable.  No significant changes. Impression . . . . . . . The patient is  tolerating radiation. Plan . . . . . . . . . . . . Continue treatment as planned.  ________________________________  -----------------------------------  Billie Lade, PhD, MD

## 2012-10-24 NOTE — Patient Instructions (Addendum)
[facility] Discharge Instructions for Patients Receiving Chemotherapy  Today you received the following chemotherapy agents taxol, carboplatin  To help prevent nausea and vomiting after your treatment, we encourage you to take your nausea medication if needed. Begin taking it at 8 pm and take it as often as prescribed.   If you develop nausea and vomiting that is not controlled by your nausea medication, call the clinic. If it is after clinic hours your family physician or the after hours number for the clinic or go to the Emergency Department.   BELOW ARE SYMPTOMS THAT SHOULD BE REPORTED IMMEDIATELY:  *FEVER GREATER THAN 100.5 F  *CHILLS WITH OR WITHOUT FEVER  NAUSEA AND VOMITING THAT IS NOT CONTROLLED WITH YOUR NAUSEA MEDICATION  *UNUSUAL SHORTNESS OF BREATH  *UNUSUAL BRUISING OR BLEEDING  TENDERNESS IN MOUTH AND THROAT WITH OR WITHOUT PRESENCE OF ULCERS  *URINARY PROBLEMS  *BOWEL PROBLEMS  UNUSUAL RASH Items with * indicate a potential emergency and should be followed up as soon as possible.  Feel free to call the clinic you have any questions or concerns. The clinic phone number is [phone].   I have been informed and understand all the instructions given to me. I know to contact the clinic, my physician, or go to the Emergency Department if any problems should occur. I do not have any questions at this time, but understand that I may call the clinic during office hours   should I have any questions or need assistance in obtaining follow up care.    __________________________________________  _____________  __________ Signature of Patient or Authorized Representative            Date                   Time    __________________________________________ Nurse's Signature

## 2012-10-25 ENCOUNTER — Ambulatory Visit
Admission: RE | Admit: 2012-10-25 | Discharge: 2012-10-25 | Disposition: A | Discharge status: Home / Self Care | Payer: BC Managed Care – PPO | Source: Ambulatory Visit | Attending: Radiation Oncology | Admitting: Radiation Oncology

## 2012-10-25 MED ORDER — RADIAPLEXRX EX GEL
Freq: Once | CUTANEOUS | Status: AC
  Administered 2012-10-24: 17:00:00 via TOPICAL

## 2012-10-25 NOTE — Addendum Note (Signed)
Encounter addended by: Eduardo Osier, RN on: 10/25/2012  9:48 AM<BR>     Documentation filed: Orders

## 2012-10-25 NOTE — Addendum Note (Signed)
Encounter addended by: Eduardo Osier, RN on: 10/25/2012  9:54 AM<BR>     Documentation filed: Inpatient MAR

## 2012-10-26 ENCOUNTER — Ambulatory Visit
Admission: RE | Admit: 2012-10-26 | Discharge: 2012-10-26 | Disposition: A | Discharge status: Home / Self Care | Payer: BC Managed Care – PPO | Source: Ambulatory Visit | Attending: Radiation Oncology | Admitting: Radiation Oncology

## 2012-10-27 ENCOUNTER — Ambulatory Visit
Admission: RE | Admit: 2012-10-27 | Discharge: 2012-10-27 | Disposition: A | Discharge status: Home / Self Care | Payer: BC Managed Care – PPO | Source: Ambulatory Visit | Attending: Radiation Oncology | Admitting: Radiation Oncology

## 2012-10-31 ENCOUNTER — Ambulatory Visit
Admission: RE | Admit: 2012-10-31 | Discharge: 2012-10-31 | Disposition: A | Discharge status: Home / Self Care | Payer: BC Managed Care – PPO | Source: Ambulatory Visit | Attending: Radiation Oncology | Admitting: Radiation Oncology

## 2012-10-31 ENCOUNTER — Other Ambulatory Visit: Payer: BC Managed Care – PPO | Admitting: Lab

## 2012-10-31 VITALS — BP 129/86 | HR 99 | Temp 97.8°F | Ht 63.0 in | Wt 134.3 lb

## 2012-10-31 DIAGNOSIS — C3491 Malignant neoplasm of unspecified part of right bronchus or lung: Secondary | ICD-10-CM

## 2012-10-31 NOTE — Progress Notes (Signed)
Ms. Wiginton is here for weekly under treat visit.  She has had 22/35 fractions to her right upper lung.  She denies pain except she does have occasional pain with swallowing in her throat and midsternal region.  She does have fatigue.  She denies an increase in shortness of breath.  She states her skin is intact.

## 2012-10-31 NOTE — Progress Notes (Signed)
  Radiation Oncology         [phone] ________________________________  Name: Linda Donovan MRN: [id number]  Date: 10/31/2012  DOB: [birth year]  Weekly Radiation Therapy Management  Current Dose: 39.6 Gy     Planned Dose:  63 Gy  Narrative . . . . . . . . The patient presents for routine under treatment assessment.                                                     The patient is without complaint.  She is starting to notice fatigue at this time. She has minimal esophageal symptoms.                                 Set-up films were reviewed.                                 The chart was checked. Physical Findings. . .  height is 5\' 3"  (1.6 m) and weight is 134 lb 4.8 oz (60.918 kg). Her temperature is 97.8 F (36.6 C). Her blood pressure is 129/86 and her pulse is 99. Her oxygen saturation is 99%. . Weight essentially stable.  The oral cavity is moist without secondary infection. The lungs are clear. The heart has a regular rhythm and rate. The patient has minimal skin reaction at this time. Impression . . . . . . . The patient is  tolerating radiation. Plan . . . . . . . . . . . . Continue treatment as planned.  ________________________________  -----------------------------------  Billie Lade, PhD, MD

## 2012-11-01 ENCOUNTER — Ambulatory Visit
Admission: RE | Admit: 2012-11-01 | Discharge: 2012-11-01 | Disposition: A | Discharge status: Home / Self Care | Payer: BC Managed Care – PPO | Source: Ambulatory Visit | Attending: Radiation Oncology | Admitting: Radiation Oncology

## 2012-11-01 ENCOUNTER — Telehealth: Payer: Self-pay | Admitting: *Deleted

## 2012-11-01 ENCOUNTER — Ambulatory Visit (HOSPITAL_BASED_OUTPATIENT_CLINIC_OR_DEPARTMENT_OTHER): Payer: BC Managed Care – PPO

## 2012-11-01 ENCOUNTER — Ambulatory Visit (HOSPITAL_BASED_OUTPATIENT_CLINIC_OR_DEPARTMENT_OTHER): Payer: BC Managed Care – PPO | Admitting: Internal Medicine

## 2012-11-01 ENCOUNTER — Encounter: Payer: Self-pay | Admitting: Internal Medicine

## 2012-11-01 ENCOUNTER — Other Ambulatory Visit (HOSPITAL_BASED_OUTPATIENT_CLINIC_OR_DEPARTMENT_OTHER): Payer: BC Managed Care – PPO | Admitting: Lab

## 2012-11-01 ENCOUNTER — Other Ambulatory Visit: Payer: BC Managed Care – PPO | Admitting: Lab

## 2012-11-01 VITALS — BP 126/74 | HR 108 | Temp 97.0°F | Resp 18 | Ht 63.0 in | Wt 133.8 lb

## 2012-11-01 DIAGNOSIS — C3490 Malignant neoplasm of unspecified part of unspecified bronchus or lung: Secondary | ICD-10-CM

## 2012-11-01 DIAGNOSIS — C3410 Malignant neoplasm of upper lobe, unspecified bronchus or lung: Secondary | ICD-10-CM

## 2012-11-01 DIAGNOSIS — Z5111 Encounter for antineoplastic chemotherapy: Secondary | ICD-10-CM

## 2012-11-01 DIAGNOSIS — C3491 Malignant neoplasm of unspecified part of right bronchus or lung: Secondary | ICD-10-CM

## 2012-11-01 LAB — COMPREHENSIVE METABOLIC PANEL (CC13)
Albumin: 3.7 g/dL (ref 3.5–5.0)
BUN: 8 mg/dL (ref 7.0–26.0)
CO2: 24 mEq/L (ref 22–29)
Calcium: 9.2 mg/dL (ref 8.4–10.4)
Chloride: 105 mEq/L (ref 98–107)
Glucose: 116 mg/dl — ABNORMAL HIGH (ref 70–99)
Potassium: 4 mEq/L (ref 3.5–5.1)

## 2012-11-01 LAB — CBC WITH DIFFERENTIAL/PLATELET
Basophils Absolute: 0 10*3/uL (ref 0.0–0.1)
EOS%: 1.3 % (ref 0.0–7.0)
Eosinophils Absolute: 0.1 10*3/uL (ref 0.0–0.5)
HCT: 38.5 % (ref 34.8–46.6)
HGB: 13.4 g/dL (ref 11.6–15.9)
MCH: 33.3 pg (ref 25.1–34.0)
NEUT%: 73.4 % (ref 38.4–76.8)
lymph#: 0.7 10*3/uL — ABNORMAL LOW (ref 0.9–3.3)

## 2012-11-01 MED ORDER — SODIUM CHLORIDE 0.9 % IV SOLN
45.0000 mg/m2 | Freq: Once | INTRAVENOUS | Status: AC
  Administered 2012-11-01: 72 mg via INTRAVENOUS
  Filled 2012-11-01: qty 12

## 2012-11-01 MED ORDER — FAMOTIDINE IN NACL 20-0.9 MG/50ML-% IV SOLN
20.0000 mg | Freq: Once | INTRAVENOUS | Status: AC
  Administered 2012-11-01: 20 mg via INTRAVENOUS

## 2012-11-01 MED ORDER — SODIUM CHLORIDE 0.9 % IV SOLN
226.0000 mg | Freq: Once | INTRAVENOUS | Status: AC
  Administered 2012-11-01: 230 mg via INTRAVENOUS
  Filled 2012-11-01: qty 23

## 2012-11-01 MED ORDER — DIPHENHYDRAMINE HCL 50 MG/ML IJ SOLN
50.0000 mg | Freq: Once | INTRAMUSCULAR | Status: AC
  Administered 2012-11-01: 50 mg via INTRAVENOUS

## 2012-11-01 MED ORDER — ONDANSETRON 16 MG/50ML IVPB (CHCC)
16.0000 mg | Freq: Once | INTRAVENOUS | Status: AC
  Administered 2012-11-01: 16 mg via INTRAVENOUS

## 2012-11-01 MED ORDER — DEXAMETHASONE SODIUM PHOSPHATE 20 MG/5ML IJ SOLN
20.0000 mg | Freq: Once | INTRAMUSCULAR | Status: AC
  Administered 2012-11-01: 20 mg via INTRAVENOUS

## 2012-11-01 MED ORDER — SODIUM CHLORIDE 0.9 % IV SOLN
Freq: Once | INTRAVENOUS | Status: AC
  Administered 2012-11-01: 14:00:00 via INTRAVENOUS

## 2012-11-01 NOTE — Progress Notes (Signed)
[facility] [phone]   [phone]  OFFICE PROGRESS NOTE  DIAGNOSIS: Stage IIIa non-small cell lung cancer, adenocarcinoma with negative EGFR mutation and negative ALK gene translocation diagnosed in March of 2014   PRIOR THERAPY: None   CURRENT THERAPY: Concurrent chemoradiation with weekly carboplatin for AUC of 2 and paclitaxel 45 mg/M2, status post 5 cycles.   INTERVAL HISTORY: Linda Donovan 57 y.o. female returns to the clinic today for followup visit accompanied by her daughter. The patient is tolerating her treatment with concurrent chemoradiation fairly well except for mild sore throat. She denied having any significant weight loss or night sweats. She has no nausea or vomiting. She denied having any significant chest pain, shortness breath, cough or hemoptysis.  MEDICAL HISTORY: Past Medical History  Diagnosis Date  . COPD (chronic obstructive pulmonary disease)   . ADD (attention deficit disorder)   . Non-small cell lung cancer 09/18/2012    ALLERGIES:  is allergic to clinoril and morphine and related.  MEDICATIONS:  Current Outpatient Prescriptions  Medication Sig Dispense Refill  . amphetamine-dextroamphetamine (ADDERALL) 20 MG tablet Take 20 mg by mouth daily before breakfast.       . Ascorbic Acid (VITAMIN C) 1000 MG tablet Take 1,000 mg by mouth daily. Taking emergen-c powder      . aspirin 81 MG tablet Take 81 mg by mouth daily.      . calcium-vitamin D (OSCAL WITH D) 250-125 MG-UNIT per tablet Take 1 tablet by mouth daily.      . hyaluronate sodium (RADIAPLEXRX) GEL Apply 1 application topically 2 (two) times daily.      Marland Kitchen loratadine (CLARITIN) 10 MG tablet Take 10 mg by mouth daily as needed.       . mometasone (NASONEX) 50 MCG/ACT nasal spray Place 2 sprays into the nose daily as needed.       . Multiple Vitamins-Minerals (MULTIVITAMIN WITH MINERALS) tablet Take 1 tablet by mouth daily.      . sucralfate (CARAFATE) 1  GM/10ML suspension Take 10 mLs (1 g total) by mouth 4 (four) times daily.  420 mL  2  . traZODone (DESYREL) 50 MG tablet Take 50 mg by mouth at bedtime.      Marland Kitchen zinc gluconate 50 MG tablet Take 50 mg by mouth daily.      Marland Kitchen albuterol (PROVENTIL HFA;VENTOLIN HFA) 108 (90 BASE) MCG/ACT inhaler Inhale 2 puffs into the lungs every 6 (six) hours as needed for wheezing.      Marland Kitchen HYDROcodone-acetaminophen (HYCET) 7.5-325 mg/15 ml solution Take 15 mLs by mouth every 4 (four) hours as needed for pain.  120 mL  0  . prochlorperazine (COMPAZINE) 10 MG tablet Take 1 tablet (10 mg total) by mouth every 6 (six) hours as needed.  60 tablet  0   No current facility-administered medications for this visit.    SURGICAL HISTORY:  Past Surgical History  Procedure Laterality Date  . Total abdominal hysterectomy    . Shoulder arthroscopy    . Video bronchoscopy Bilateral 07/25/2012    Procedure: VIDEO BRONCHOSCOPY WITH FLUORO;  Surgeon: Nyoka Cowden, MD;  Location: [facility];  Service: Endoscopy;  Laterality: Bilateral;  . Lump removed from breasr right  2003  . Endobronchial ultrasound Bilateral 09/04/2012    Procedure: ENDOBRONCHIAL ULTRASOUND;  Surgeon: Leslye Peer, MD;  Location: [facility];  Service: Cardiopulmonary;  Laterality: Bilateral;    REVIEW OF SYSTEMS:  A comprehensive review of systems  was negative.   PHYSICAL EXAMINATION: General appearance: alert, cooperative and no distress Head: Normocephalic, without obvious abnormality, atraumatic Neck: no adenopathy Lymph nodes: Cervical, supraclavicular, and axillary nodes normal. Resp: clear to auscultation bilaterally Cardio: regular rate and rhythm, S1, S2 normal, no murmur, click, rub or gallop GI: soft, non-tender; bowel sounds normal; no masses,  no organomegaly Extremities: extremities normal, atraumatic, no cyanosis or edema  ECOG PERFORMANCE STATUS: 0 - Asymptomatic  Blood pressure 126/74, pulse 108, temperature 97 F (36.1 C),  temperature source Oral, resp. rate 18, height 5\' 3"  (1.6 m), weight 133 lb 12.8 oz (60.691 kg).  LABORATORY DATA: Lab Results  Component Value Date   WBC 5.4 11/01/2012   HGB 13.4 11/01/2012   HCT 38.5 11/01/2012   MCV 95.5 11/01/2012   PLT 173 11/01/2012      Chemistry      Component Value Date/Time   NA 139 10/24/2012 1220   NA 138 09/04/2012 0815   K 3.7 10/24/2012 1220   K 3.4* 09/04/2012 0815   CL 105 10/24/2012 1220   CL 100 09/04/2012 0815   CO2 24 10/24/2012 1220   CO2 28 09/04/2012 0815   BUN 5.5* 10/24/2012 1220   BUN 6 09/04/2012 0815   CREATININE 0.6 10/24/2012 1220   CREATININE 0.65 09/04/2012 0815      Component Value Date/Time   CALCIUM 8.8 10/24/2012 1220   CALCIUM 9.3 09/04/2012 0815   ALKPHOS 80 10/24/2012 1220   ALKPHOS 90 09/04/2012 0815   AST 24 10/24/2012 1220   AST 22 09/04/2012 0815   ALT 20 10/24/2012 1220   ALT 16 09/04/2012 0815   BILITOT 0.89 10/24/2012 1220   BILITOT 0.6 09/04/2012 0815       RADIOGRAPHIC STUDIES: No results found.  ASSESSMENT: This is a very pleasant 57 years old white female with a stage IIIa non-small cell lung cancer currently undergoing concurrent chemoradiation with weekly carboplatin and paclitaxel is status post 5 cycles.   PLAN: She is tolerating her treatment fairly well with no significant adverse effects. The patient will proceed with cycle #6 today as scheduled. She'll come back for followup visit in 2 weeks for reevaluation. She was advised to call me immediately if she has any concerning symptoms in the interval.  All questions were answered. The patient knows to call the clinic with any problems, questions or concerns. We can certainly see the patient much sooner if necessary.

## 2012-11-01 NOTE — Telephone Encounter (Signed)
Per staff message and POF I have scheduled appts.  JMW  

## 2012-11-01 NOTE — Patient Instructions (Addendum)
[facility] Discharge Instructions for Patients Receiving Chemotherapy  Today you received the following chemotherapy agents taxol, carboplatin   If you develop nausea and vomiting that is not controlled by your nausea medication, call the clinic. If it is after clinic hours your family physician or the after hours number for the clinic or go to the Emergency Department.   BELOW ARE SYMPTOMS THAT SHOULD BE REPORTED IMMEDIATELY:  *FEVER GREATER THAN 100.5 F  *CHILLS WITH OR WITHOUT FEVER  NAUSEA AND VOMITING THAT IS NOT CONTROLLED WITH YOUR NAUSEA MEDICATION  *UNUSUAL SHORTNESS OF BREATH  *UNUSUAL BRUISING OR BLEEDING  TENDERNESS IN MOUTH AND THROAT WITH OR WITHOUT PRESENCE OF ULCERS  *URINARY PROBLEMS  *BOWEL PROBLEMS  UNUSUAL RASH Items with * indicate a potential emergency and should be followed up as soon as possible.  Feel free to call the clinic you have any questions or concerns. The clinic phone number is [phone].   I have been informed and understand all the instructions given to me. I know to contact the clinic, my physician, or go to the Emergency Department if any problems should occur. I do not have any questions at this time, but understand that I may call the clinic during office hours   should I have any questions or need assistance in obtaining follow up care.    __________________________________________  _____________  __________ Signature of Patient or Authorized Representative            Date                   Time    __________________________________________ Nurse's Signature

## 2012-11-01 NOTE — Patient Instructions (Signed)
Continue chemotherapy today as scheduled.  Follow up visit in 2 weeks. 

## 2012-11-02 ENCOUNTER — Telehealth: Payer: Self-pay | Admitting: *Deleted

## 2012-11-02 ENCOUNTER — Telehealth: Payer: Self-pay | Admitting: Internal Medicine

## 2012-11-02 ENCOUNTER — Ambulatory Visit
Admission: RE | Admit: 2012-11-02 | Discharge: 2012-11-02 | Disposition: A | Discharge status: Home / Self Care | Payer: BC Managed Care – PPO | Source: Ambulatory Visit | Attending: Radiation Oncology | Admitting: Radiation Oncology

## 2012-11-02 NOTE — Telephone Encounter (Signed)
Per staff phone call and POF I have schedueld appts.  JMW  

## 2012-11-03 ENCOUNTER — Ambulatory Visit
Admission: RE | Admit: 2012-11-03 | Discharge: 2012-11-03 | Disposition: A | Discharge status: Home / Self Care | Payer: BC Managed Care – PPO | Source: Ambulatory Visit | Attending: Radiation Oncology | Admitting: Radiation Oncology

## 2012-11-06 ENCOUNTER — Ambulatory Visit
Admission: RE | Admit: 2012-11-06 | Discharge: 2012-11-06 | Disposition: A | Discharge status: Home / Self Care | Payer: BC Managed Care – PPO | Source: Ambulatory Visit | Attending: Radiation Oncology | Admitting: Radiation Oncology

## 2012-11-07 ENCOUNTER — Ambulatory Visit: Payer: BC Managed Care – PPO | Admitting: Nutrition

## 2012-11-07 ENCOUNTER — Other Ambulatory Visit (HOSPITAL_BASED_OUTPATIENT_CLINIC_OR_DEPARTMENT_OTHER): Payer: BC Managed Care – PPO | Admitting: Lab

## 2012-11-07 ENCOUNTER — Encounter: Payer: Self-pay | Admitting: Radiation Oncology

## 2012-11-07 ENCOUNTER — Ambulatory Visit
Admission: RE | Admit: 2012-11-07 | Discharge: 2012-11-07 | Disposition: A | Discharge status: Home / Self Care | Payer: BC Managed Care – PPO | Source: Ambulatory Visit | Attending: Radiation Oncology | Admitting: Radiation Oncology

## 2012-11-07 ENCOUNTER — Ambulatory Visit (HOSPITAL_BASED_OUTPATIENT_CLINIC_OR_DEPARTMENT_OTHER): Payer: BC Managed Care – PPO

## 2012-11-07 VITALS — BP 134/86 | HR 107 | Temp 98.2°F | Resp 18

## 2012-11-07 VITALS — BP 159/94 | HR 120 | Resp 18 | Wt 136.5 lb

## 2012-11-07 DIAGNOSIS — C3491 Malignant neoplasm of unspecified part of right bronchus or lung: Secondary | ICD-10-CM

## 2012-11-07 DIAGNOSIS — C3410 Malignant neoplasm of upper lobe, unspecified bronchus or lung: Secondary | ICD-10-CM

## 2012-11-07 DIAGNOSIS — Z5111 Encounter for antineoplastic chemotherapy: Secondary | ICD-10-CM

## 2012-11-07 LAB — CBC WITH DIFFERENTIAL/PLATELET
Basophils Absolute: 0 10*3/uL (ref 0.0–0.1)
EOS%: 0.7 % (ref 0.0–7.0)
HGB: 12.9 g/dL (ref 11.6–15.9)
LYMPH%: 16.8 % (ref 14.0–49.7)
MCH: 33.2 pg (ref 25.1–34.0)
MCV: 95.1 fL (ref 79.5–101.0)
MONO%: 4.2 % (ref 0.0–14.0)
NEUT%: 77.9 % — ABNORMAL HIGH (ref 38.4–76.8)
RDW: 14.6 % — ABNORMAL HIGH (ref 11.2–14.5)

## 2012-11-07 LAB — COMPREHENSIVE METABOLIC PANEL (CC13)
Alkaline Phosphatase: 87 U/L (ref 40–150)
BUN: 9.5 mg/dL (ref 7.0–26.0)
Creatinine: 0.6 mg/dL (ref 0.6–1.1)
Glucose: 86 mg/dl (ref 70–99)
Sodium: 137 mEq/L (ref 136–145)
Total Bilirubin: 0.57 mg/dL (ref 0.20–1.20)

## 2012-11-07 MED ORDER — SODIUM CHLORIDE 0.9 % IV SOLN
Freq: Once | INTRAVENOUS | Status: AC
  Administered 2012-11-07: 14:00:00 via INTRAVENOUS

## 2012-11-07 MED ORDER — SODIUM CHLORIDE 0.9 % IV SOLN
226.0000 mg | Freq: Once | INTRAVENOUS | Status: AC
  Administered 2012-11-07: 230 mg via INTRAVENOUS
  Filled 2012-11-07: qty 23

## 2012-11-07 MED ORDER — FAMOTIDINE IN NACL 20-0.9 MG/50ML-% IV SOLN
20.0000 mg | Freq: Once | INTRAVENOUS | Status: AC
  Administered 2012-11-07: 20 mg via INTRAVENOUS

## 2012-11-07 MED ORDER — DEXAMETHASONE SODIUM PHOSPHATE 20 MG/5ML IJ SOLN
20.0000 mg | Freq: Once | INTRAMUSCULAR | Status: AC
  Administered 2012-11-07: 20 mg via INTRAVENOUS

## 2012-11-07 MED ORDER — SODIUM CHLORIDE 0.9 % IV SOLN
45.0000 mg/m2 | Freq: Once | INTRAVENOUS | Status: AC
  Administered 2012-11-07: 72 mg via INTRAVENOUS
  Filled 2012-11-07: qty 12

## 2012-11-07 MED ORDER — ONDANSETRON 16 MG/50ML IVPB (CHCC)
16.0000 mg | Freq: Once | INTRAVENOUS | Status: AC
  Administered 2012-11-07: 16 mg via INTRAVENOUS

## 2012-11-07 MED ORDER — DIPHENHYDRAMINE HCL 50 MG/ML IJ SOLN
50.0000 mg | Freq: Once | INTRAMUSCULAR | Status: AC
  Administered 2012-11-07: 50 mg via INTRAVENOUS

## 2012-11-07 NOTE — Progress Notes (Signed)
Reports shortness of breath with exertion. Denies cough. Reports only mild pain with swallowing. Reports tenderness right upper back associated with skin changes. Hyperpigmentation of right upper back and mediastinum noted without desquamation. Reported using radiaplex as directed. Denies nausea, vomiting, dizziness, headache, night sweats or diarrhea.

## 2012-11-07 NOTE — Patient Instructions (Signed)
[facility] Center Discharge Instructions for Patients Receiving Chemotherapy  Today you received the following chemotherapy agents :  Taxol, Carboplatin.  To help prevent nausea and vomiting after your treatment, we encourage you to take your nausea medication as instructed by your physician.   If you develop nausea and vomiting that is not controlled by your nausea medication, call the clinic.   BELOW ARE SYMPTOMS THAT SHOULD BE REPORTED IMMEDIATELY:  *FEVER GREATER THAN 100.5 F  *CHILLS WITH OR WITHOUT FEVER  NAUSEA AND VOMITING THAT IS NOT CONTROLLED WITH YOUR NAUSEA MEDICATION  *UNUSUAL SHORTNESS OF BREATH  *UNUSUAL BRUISING OR BLEEDING  TENDERNESS IN MOUTH AND THROAT WITH OR WITHOUT PRESENCE OF ULCERS  *URINARY PROBLEMS  *BOWEL PROBLEMS  UNUSUAL RASH Items with * indicate a potential emergency and should be followed up as soon as possible.  Feel free to call the clinic you have any questions or concerns. The clinic phone number is [phone].    

## 2012-11-07 NOTE — Progress Notes (Signed)
[facility]    Radiation Oncology [address] [place]     Maryln Gottron, M.D. [place], [place] [address]               Billie Lade, M.D., Ph.D. Phone: [phone]      Molli Hazard A. Kathrynn Running, M.D. Fax: [phone]      Radene Gunning, M.D., Ph.D.         Lurline Hare, M.D.         Grayland Jack, M.D Weekly Treatment Management Note  Name: Linda Donovan     MRN: [id number]        CSN: [id number] Date: 11/07/2012      DOB: February 04, 1956  CC: Pcp Not In System         Linda Donovan    Status: Outpatient  Diagnosis: The encounter diagnosis was Non-small cell lung cancer, right.  Current Dose: 48.6 Gy  Current Fraction: 27  Planned Dose: 63 Gy  Narrative: Linda Donovan was seen today for weekly treatment management. The chart was checked and CBCT  were reviewed. She overall is tolerating the treatments well. She has some dyspnea with exertion. She denies any swallowing difficulties or pain with swallowing. she has some fatigue at this time.  Clinoril and Morphine and related  Current Outpatient Prescriptions  Medication Sig Dispense Refill  . albuterol (PROVENTIL HFA;VENTOLIN HFA) 108 (90 BASE) MCG/ACT inhaler Inhale 2 puffs into the lungs every 6 (six) hours as needed for wheezing.      Marland Kitchen amphetamine-dextroamphetamine (ADDERALL) 20 MG tablet Take 20 mg by mouth daily before breakfast.       . Ascorbic Acid (VITAMIN C) 1000 MG tablet Take 1,000 mg by mouth daily. Taking emergen-c powder      . aspirin 81 MG tablet Take 81 mg by mouth daily.      . calcium-vitamin D (OSCAL WITH D) 250-125 MG-UNIT per tablet Take 1 tablet by mouth daily.      . hyaluronate sodium (RADIAPLEXRX) GEL Apply 1 application topically 2 (two) times daily.      Marland Kitchen HYDROcodone-acetaminophen (HYCET) 7.5-325 mg/15 ml solution Take 15 mLs by mouth every 4 (four) hours as needed for pain.  120 mL  0  . loratadine (CLARITIN) 10 MG tablet Take 10 mg by mouth daily as needed.       . mometasone (NASONEX)  50 MCG/ACT nasal spray Place 2 sprays into the nose daily as needed.       . Multiple Vitamins-Minerals (MULTIVITAMIN WITH MINERALS) tablet Take 1 tablet by mouth daily.      . prochlorperazine (COMPAZINE) 10 MG tablet Take 1 tablet (10 mg total) by mouth every 6 (six) hours as needed.  60 tablet  0  . sucralfate (CARAFATE) 1 GM/10ML suspension Take 10 mLs (1 g total) by mouth 4 (four) times daily.  420 mL  2  . traZODone (DESYREL) 50 MG tablet Take 50 mg by mouth at bedtime.      Marland Kitchen zinc gluconate 50 MG tablet Take 50 mg by mouth daily.       No current facility-administered medications for this encounter.   Labs:  Lab Results  Component Value Date   WBC 5.7 11/07/2012   HGB 12.9 11/07/2012   HCT 36.9 11/07/2012   MCV 95.1 11/07/2012   PLT 177 11/07/2012   Lab Results  Component Value Date   CREATININE 0.6 11/07/2012   BUN 9.5 11/07/2012   NA 137 11/07/2012   K  3.6 11/07/2012   CL 105 11/07/2012   CO2 25 11/07/2012   Lab Results  Component Value Date   ALT 20 11/07/2012   AST 21 11/07/2012   BILITOT 0.57 11/07/2012    Physical Examination:  weight is 136 lb 8 oz (61.916 kg). Her blood pressure is 159/94 and her pulse is 120. Her respiration is 18 and oxygen saturation is 88%.    Wt Readings from Last 3 Encounters:  11/07/12 136 lb 8 oz (61.916 kg)  11/01/12 133 lb 12.8 oz (60.691 kg)  10/31/12 134 lb 4.8 oz (60.918 kg)    Mild hyperpigmentation changes of the skin in the radiation portals along the right upper back Lungs - Normal respiratory effort, chest expands symmetrically. Lungs are clear to auscultation, no crackles or wheezes.  Heart has regular rhythm and rate  Abdomen is soft and non tender with normal bowel sounds  Assessment:  Patient tolerating treatments well  Plan: Continue treatment per original radiation prescription

## 2012-11-07 NOTE — Progress Notes (Signed)
Brief discussion with patient today in the lobby. She states she is eating well. She really has no nutrition concerns at this time. She does enjoy drinking Ensure Plus however reports it is expensive. I have provided one complementary case of Ensure Plus for patient to take with her today as well as coupons and recipes for milkshakes. Patient is appreciative. I will continue to follow patient as needed.

## 2012-11-08 ENCOUNTER — Ambulatory Visit
Admission: RE | Admit: 2012-11-08 | Discharge: 2012-11-08 | Disposition: A | Discharge status: Home / Self Care | Payer: BC Managed Care – PPO | Source: Ambulatory Visit | Attending: Radiation Oncology | Admitting: Radiation Oncology

## 2012-11-09 ENCOUNTER — Ambulatory Visit
Admission: RE | Admit: 2012-11-09 | Discharge: 2012-11-09 | Disposition: A | Discharge status: Home / Self Care | Payer: BC Managed Care – PPO | Source: Ambulatory Visit | Attending: Radiation Oncology | Admitting: Radiation Oncology

## 2012-11-10 ENCOUNTER — Ambulatory Visit
Admission: RE | Admit: 2012-11-10 | Discharge: 2012-11-10 | Disposition: A | Discharge status: Home / Self Care | Payer: BC Managed Care – PPO | Source: Ambulatory Visit | Attending: Radiation Oncology | Admitting: Radiation Oncology

## 2012-11-10 ENCOUNTER — Ambulatory Visit: Payer: BC Managed Care – PPO

## 2012-11-13 ENCOUNTER — Ambulatory Visit
Admission: RE | Admit: 2012-11-13 | Discharge: 2012-11-13 | Disposition: A | Discharge status: Home / Self Care | Payer: BC Managed Care – PPO | Source: Ambulatory Visit | Attending: Radiation Oncology | Admitting: Radiation Oncology

## 2012-11-13 ENCOUNTER — Encounter: Payer: Self-pay | Admitting: Internal Medicine

## 2012-11-13 ENCOUNTER — Ambulatory Visit (HOSPITAL_BASED_OUTPATIENT_CLINIC_OR_DEPARTMENT_OTHER): Payer: BC Managed Care – PPO | Admitting: Internal Medicine

## 2012-11-13 ENCOUNTER — Telehealth: Payer: Self-pay | Admitting: Internal Medicine

## 2012-11-13 ENCOUNTER — Ambulatory Visit (HOSPITAL_BASED_OUTPATIENT_CLINIC_OR_DEPARTMENT_OTHER): Payer: BC Managed Care – PPO

## 2012-11-13 ENCOUNTER — Other Ambulatory Visit (HOSPITAL_BASED_OUTPATIENT_CLINIC_OR_DEPARTMENT_OTHER): Payer: BC Managed Care – PPO

## 2012-11-13 VITALS — BP 145/84 | HR 117 | Temp 97.2°F | Resp 18 | Ht 63.0 in | Wt 135.3 lb

## 2012-11-13 DIAGNOSIS — Z5111 Encounter for antineoplastic chemotherapy: Secondary | ICD-10-CM

## 2012-11-13 DIAGNOSIS — C3410 Malignant neoplasm of upper lobe, unspecified bronchus or lung: Secondary | ICD-10-CM

## 2012-11-13 DIAGNOSIS — C3490 Malignant neoplasm of unspecified part of unspecified bronchus or lung: Secondary | ICD-10-CM

## 2012-11-13 LAB — COMPREHENSIVE METABOLIC PANEL (CC13)
ALT: 24 U/L (ref 0–55)
Albumin: 3.9 g/dL (ref 3.5–5.0)
BUN: 6.3 mg/dL — ABNORMAL LOW (ref 7.0–26.0)
CO2: 26 mEq/L (ref 22–29)
Calcium: 9.4 mg/dL (ref 8.4–10.4)
Chloride: 104 mEq/L (ref 98–107)
Creatinine: 0.6 mg/dL (ref 0.6–1.1)
Potassium: 3.4 mEq/L — ABNORMAL LOW (ref 3.5–5.1)

## 2012-11-13 LAB — CBC WITH DIFFERENTIAL/PLATELET
BASO%: 0.5 % (ref 0.0–2.0)
Basophils Absolute: 0 10*3/uL (ref 0.0–0.1)
HCT: 34.8 % (ref 34.8–46.6)
HGB: 12.2 g/dL (ref 11.6–15.9)
MONO#: 0.4 10*3/uL (ref 0.1–0.9)
NEUT%: 78.7 % — ABNORMAL HIGH (ref 38.4–76.8)
RDW: 15 % — ABNORMAL HIGH (ref 11.2–14.5)
WBC: 5.9 10*3/uL (ref 3.9–10.3)
lymph#: 0.8 10*3/uL — ABNORMAL LOW (ref 0.9–3.3)

## 2012-11-13 MED ORDER — ONDANSETRON 16 MG/50ML IVPB (CHCC)
16.0000 mg | Freq: Once | INTRAVENOUS | Status: AC
  Administered 2012-11-13: 16 mg via INTRAVENOUS

## 2012-11-13 MED ORDER — DIPHENHYDRAMINE HCL 50 MG/ML IJ SOLN
50.0000 mg | Freq: Once | INTRAMUSCULAR | Status: AC
  Administered 2012-11-13: 50 mg via INTRAVENOUS

## 2012-11-13 MED ORDER — SODIUM CHLORIDE 0.9 % IV SOLN
226.0000 mg | Freq: Once | INTRAVENOUS | Status: AC
  Administered 2012-11-13: 230 mg via INTRAVENOUS
  Filled 2012-11-13: qty 23

## 2012-11-13 MED ORDER — SODIUM CHLORIDE 0.9 % IV SOLN
45.0000 mg/m2 | Freq: Once | INTRAVENOUS | Status: AC
  Administered 2012-11-13: 72 mg via INTRAVENOUS
  Filled 2012-11-13: qty 12

## 2012-11-13 MED ORDER — SODIUM CHLORIDE 0.9 % IV SOLN
Freq: Once | INTRAVENOUS | Status: AC
  Administered 2012-11-13: 14:00:00 via INTRAVENOUS

## 2012-11-13 MED ORDER — FAMOTIDINE IN NACL 20-0.9 MG/50ML-% IV SOLN
20.0000 mg | Freq: Once | INTRAVENOUS | Status: AC
Start: 2012-11-13 — End: 2012-11-13
  Administered 2012-11-13: 20 mg via INTRAVENOUS

## 2012-11-13 MED ORDER — DEXAMETHASONE SODIUM PHOSPHATE 20 MG/5ML IJ SOLN
20.0000 mg | Freq: Once | INTRAMUSCULAR | Status: AC
  Administered 2012-11-13: 20 mg via INTRAVENOUS

## 2012-11-13 NOTE — Patient Instructions (Signed)
Continue chemoradiation as scheduled. Followup visit in 6 weeks with repeat CT scan of the chest.

## 2012-11-13 NOTE — Telephone Encounter (Signed)
Gave pt appt for lab and MD on july 2014 °

## 2012-11-13 NOTE — Progress Notes (Signed)
[facility] Telephone:([phone]   Fax:(336) [phone]  OFFICE PROGRESS NOTE  DIAGNOSIS: Stage IIIa non-small cell lung cancer, adenocarcinoma with negative EGFR mutation and negative ALK gene translocation diagnosed in March of 2014   PRIOR THERAPY: None   CURRENT THERAPY: Concurrent chemoradiation with weekly carboplatin for AUC of 2 and paclitaxel 45 mg/M2, status post 7 cycles.  INTERVAL HISTORY: Linda Donovan 57 y.o. female returns to the clinic today for followup visit accompanied her husband. The patient is feeling fine today with no specific complaints. She is tolerating her course of concurrent chemoradiation fairly well with no significant adverse effect. She denied having any fever or chills. She has no nausea or vomiting. The patient denied having any significant weight loss or night sweats. She denied having any significant chest pain, shortness breath, cough or hemoptysis. She is expected to complete this course of concurrent chemoradiation later this week.  MEDICAL HISTORY: Past Medical History  Diagnosis Date  . COPD (chronic obstructive pulmonary disease)   . ADD (attention deficit disorder)   . Non-small cell lung cancer 09/18/2012    ALLERGIES:  is allergic to clinoril and morphine and related.  MEDICATIONS:  Current Outpatient Prescriptions  Medication Sig Dispense Refill  . amphetamine-dextroamphetamine (ADDERALL) 20 MG tablet Take 20 mg by mouth daily before breakfast.       . Ascorbic Acid (VITAMIN C) 1000 MG tablet Take 1,000 mg by mouth daily. Taking emergen-c powder      . aspirin 81 MG tablet Take 81 mg by mouth daily.      . calcium-vitamin D (OSCAL WITH D) 250-125 MG-UNIT per tablet Take 1 tablet by mouth daily.      . hyaluronate sodium (RADIAPLEXRX) GEL Apply 1 application topically 2 (two) times daily.      Marland Kitchen loratadine (CLARITIN) 10 MG tablet Take 10 mg by mouth daily as needed.       . mometasone (NASONEX) 50 MCG/ACT nasal  spray Place 2 sprays into the nose daily as needed.       . Multiple Vitamins-Minerals (MULTIVITAMIN WITH MINERALS) tablet Take 1 tablet by mouth daily.      . sucralfate (CARAFATE) 1 GM/10ML suspension Take 10 mLs (1 g total) by mouth 4 (four) times daily.  420 mL  2  . traZODone (DESYREL) 50 MG tablet Take 50 mg by mouth at bedtime.      Marland Kitchen zinc gluconate 50 MG tablet Take 50 mg by mouth daily.      Marland Kitchen albuterol (PROVENTIL HFA;VENTOLIN HFA) 108 (90 BASE) MCG/ACT inhaler Inhale 2 puffs into the lungs every 6 (six) hours as needed for wheezing.      Marland Kitchen HYDROcodone-acetaminophen (HYCET) 7.5-325 mg/15 ml solution Take 15 mLs by mouth every 4 (four) hours as needed for pain.  120 mL  0  . prochlorperazine (COMPAZINE) 10 MG tablet Take 1 tablet (10 mg total) by mouth every 6 (six) hours as needed.  60 tablet  0   No current facility-administered medications for this visit.    SURGICAL HISTORY:  Past Surgical History  Procedure Laterality Date  . Total abdominal hysterectomy    . Shoulder arthroscopy    . Video bronchoscopy Bilateral 07/25/2012    Procedure: VIDEO BRONCHOSCOPY WITH FLUORO;  Surgeon: Nyoka Cowden, MD;  Location: [facility];  Service: Endoscopy;  Laterality: Bilateral;  . Lump removed from breasr right  2003  . Endobronchial ultrasound Bilateral 09/04/2012    Procedure: ENDOBRONCHIAL ULTRASOUND;  Surgeon: Leslye Peer, MD;  Location: [facility];  Service: Cardiopulmonary;  Laterality: Bilateral;    REVIEW OF SYSTEMS:  A comprehensive review of systems was negative.   PHYSICAL EXAMINATION: General appearance: alert, cooperative and no distress Head: Normocephalic, without obvious abnormality, atraumatic Neck: no adenopathy Lymph nodes: Cervical, supraclavicular, and axillary nodes normal. Resp: clear to auscultation bilaterally Cardio: regular rate and rhythm, S1, S2 normal, no murmur, click, rub or gallop GI: soft, non-tender; bowel sounds normal; no masses,  no  organomegaly Extremities: extremities normal, atraumatic, no cyanosis or edema  ECOG PERFORMANCE STATUS: 0 - Asymptomatic  Blood pressure 145/84, pulse 117, temperature 97.2 F (36.2 C), temperature source Oral, resp. rate 18, height 5\' 3"  (1.6 m), weight 135 lb 4.8 oz (61.372 kg).  LABORATORY DATA: Lab Results  Component Value Date   WBC 5.9 11/13/2012   HGB 12.2 11/13/2012   HCT 34.8 11/13/2012   MCV 96.1 11/13/2012   PLT 257 11/13/2012      Chemistry      Component Value Date/Time   NA 137 11/07/2012 1210   NA 138 09/04/2012 0815   K 3.6 11/07/2012 1210   K 3.4* 09/04/2012 0815   CL 105 11/07/2012 1210   CL 100 09/04/2012 0815   CO2 25 11/07/2012 1210   CO2 28 09/04/2012 0815   BUN 9.5 11/07/2012 1210   BUN 6 09/04/2012 0815   CREATININE 0.6 11/07/2012 1210   CREATININE 0.65 09/04/2012 0815      Component Value Date/Time   CALCIUM 8.8 11/07/2012 1210   CALCIUM 9.3 09/04/2012 0815   ALKPHOS 87 11/07/2012 1210   ALKPHOS 90 09/04/2012 0815   AST 21 11/07/2012 1210   AST 22 09/04/2012 0815   ALT 20 11/07/2012 1210   ALT 16 09/04/2012 0815   BILITOT 0.57 11/07/2012 1210   BILITOT 0.6 09/04/2012 0815       RADIOGRAPHIC STUDIES: No results found.  ASSESSMENT: This is a very pleasant 57 years old white female with a stage IIIa non-small cell lung cancer currently undergoing concurrent chemoradiation with weekly carboplatin and paclitaxel is status post 7 cycles. The patient expected to complete this course of concurrent chemoradiation this week.   PLAN: I recommended for her to finish her last cycle of the chemotherapy today as scheduled. I would see the patient back for followup visit in 6 weeks with repeat CT scan of the chest for reevaluation of her disease. She was advised to call immediately if she has any concerning symptoms in the interval.  All questions were answered. The patient knows to call the clinic with any problems, questions or concerns. We can certainly see the patient much sooner if  necessary.

## 2012-11-13 NOTE — Patient Instructions (Addendum)
[facility] Discharge Instructions for Patients Receiving Chemotherapy  Today you received the following chemotherapy agents carbplatin, taxol  To help prevent nausea and vomiting after your treatment, we encourage you to take your nausea medication as needed   If you develop nausea and vomiting that is not controlled by your nausea medication, call the clinic.   BELOW ARE SYMPTOMS THAT SHOULD BE REPORTED IMMEDIATELY:  *FEVER GREATER THAN 100.5 F  *CHILLS WITH OR WITHOUT FEVER  NAUSEA AND VOMITING THAT IS NOT CONTROLLED WITH YOUR NAUSEA MEDICATION  *UNUSUAL SHORTNESS OF BREATH  *UNUSUAL BRUISING OR BLEEDING  TENDERNESS IN MOUTH AND THROAT WITH OR WITHOUT PRESENCE OF ULCERS  *URINARY PROBLEMS  *BOWEL PROBLEMS  UNUSUAL RASH Items with * indicate a potential emergency and should be followed up as soon as possible.  Feel free to call the clinic you have any questions or concerns. The clinic phone number is [phone].

## 2012-11-14 ENCOUNTER — Ambulatory Visit
Admission: RE | Admit: 2012-11-14 | Discharge: 2012-11-14 | Disposition: A | Discharge status: Home / Self Care | Payer: BC Managed Care – PPO | Source: Ambulatory Visit | Attending: Radiation Oncology | Admitting: Radiation Oncology

## 2012-11-14 ENCOUNTER — Encounter: Payer: Self-pay | Admitting: Radiation Oncology

## 2012-11-14 VITALS — BP 125/67 | HR 108 | Temp 98.8°F | Ht 63.0 in | Wt 137.2 lb

## 2012-11-14 DIAGNOSIS — C3491 Malignant neoplasm of unspecified part of right bronchus or lung: Secondary | ICD-10-CM

## 2012-11-14 MED ORDER — BIAFINE EX EMUL
CUTANEOUS | Status: DC | PRN
  Administered 2012-11-14: 18:00:00 via TOPICAL

## 2012-11-14 NOTE — Progress Notes (Signed)
Linda Donovan has received 32 fractions to her RUL Mediastinal region.  Bright, "sore Erythema of the right shoulder blade region with pain reported as a level 7 on a scale of 0-10.  Erythema note in the medial, anterior chest area too.   She c/o of fatigue after 4 pm daily.  Eating well.   Accompanied by her spouse.

## 2012-11-14 NOTE — Progress Notes (Signed)
[facility]    Radiation Oncology [address] [place]     Maryln Gottron, M.D. [place], [place] [address]               Billie Lade, M.D., Ph.D. Phone: [phone]      Molli Hazard A. Kathrynn Running, M.D. Fax: [phone]      Radene Gunning, M.D., Ph.D.         Lurline Hare, M.D.         Grayland Jack, M.D Weekly Treatment Management Note  Name: Linda Donovan     MRN: [id number]        CSN: [id number] Date: 11/14/2012      DOB: Oct 20, 1955  CC: Pcp Not In System         [name]    Status: Outpatient  Diagnosis: stage IIIa non-small cell lung cancer   Current Dose: 57.6 Gy  Current Fraction: 32  Planned Dose: 63 Gy  Narrative: [name] was seen today for weekly treatment management. The chart was checked and CBCT  were reviewed. She continues to tolerate her treatments well with minimal esophageal symptoms. She denies any breathing problems. She has noticed however some burning and itching along the skin of the right upper back area.  Clinoril and Morphine and related Current Outpatient Prescriptions  Medication Sig Dispense Refill  . albuterol (PROVENTIL HFA;VENTOLIN HFA) 108 (90 BASE) MCG/ACT inhaler Inhale 2 puffs into the lungs every 6 (six) hours as needed for wheezing.      Marland Kitchen amphetamine-dextroamphetamine (ADDERALL) 20 MG tablet Take 20 mg by mouth daily before breakfast.       . Ascorbic Acid (VITAMIN C) 1000 MG tablet Take 1,000 mg by mouth daily. Taking emergen-c powder      . aspirin 81 MG tablet Take 81 mg by mouth daily.      . calcium-vitamin D (OSCAL WITH D) 250-125 MG-UNIT per tablet Take 1 tablet by mouth daily.      . hyaluronate sodium (RADIAPLEXRX) GEL Apply 1 application topically 2 (two) times daily.      Marland Kitchen HYDROcodone-acetaminophen (HYCET) 7.5-325 mg/15 ml solution Take 15 mLs by mouth every 4 (four) hours as needed for pain.  120 mL  0  . loratadine (CLARITIN) 10 MG tablet Take 10 mg by mouth daily as needed.       . mometasone (NASONEX)  50 MCG/ACT nasal spray Place 2 sprays into the nose daily as needed.       . Multiple Vitamins-Minerals (MULTIVITAMIN WITH MINERALS) tablet Take 1 tablet by mouth daily.      . prochlorperazine (COMPAZINE) 10 MG tablet Take 1 tablet (10 mg total) by mouth every 6 (six) hours as needed.  60 tablet  0  . sucralfate (CARAFATE) 1 GM/10ML suspension Take 10 mLs (1 g total) by mouth 4 (four) times daily.  420 mL  2  . traZODone (DESYREL) 50 MG tablet Take 50 mg by mouth at bedtime.      Marland Kitchen zinc gluconate 50 MG tablet Take 50 mg by mouth daily.       Current Facility-Administered Medications  Medication Dose Route Frequency Provider Last Rate Last Dose  . topical emolient (BIAFINE) emulsion   Topical PRN Billie Lade, MD       Labs:  Lab Results  Component Value Date   WBC 5.9 11/13/2012   HGB 12.2 11/13/2012   HCT 34.8 11/13/2012   MCV 96.1 11/13/2012   PLT 257 11/13/2012  Lab Results  Component Value Date   CREATININE 0.6 11/13/2012   BUN 6.3* 11/13/2012   NA 138 11/13/2012   K 3.4* 11/13/2012   CL 104 11/13/2012   CO2 26 11/13/2012   Lab Results  Component Value Date   ALT 24 11/13/2012   AST 22 11/13/2012   BILITOT 0.73 11/13/2012    Physical Examination:  height is 5\' 3"  (1.6 m) and weight is 137 lb 3.2 oz (62.234 kg). Her temperature is 98.8 F (37.1 C). Her blood pressure is 125/67 and her pulse is 108.    Wt Readings from Last 3 Encounters:  11/14/12 137 lb 3.2 oz (62.234 kg)  11/13/12 135 lb 4.8 oz (61.372 kg)  11/07/12 136 lb 8 oz (61.916 kg)    The patient has a brisk erythema along the right upper back without any moist desquamation. Lungs - Normal respiratory effort, chest expands symmetrically. Lungs are clear to auscultation, no crackles or wheezes.  Heart has regular rhythm and rate  Abdomen is soft and non tender with normal bowel sounds  Assessment:  Patient tolerating treatments well except for above issues.  Plan: Continue treatment per original radiation prescription.  The  patient will be switched to Biafine for her skin problem. I have advised the patient's husband to let us know if this reaction gets more significant and will use  Silvadene.

## 2012-11-15 ENCOUNTER — Ambulatory Visit
Admission: RE | Admit: 2012-11-15 | Discharge: 2012-11-15 | Disposition: A | Discharge status: Home / Self Care | Payer: BC Managed Care – PPO | Source: Ambulatory Visit | Attending: Radiation Oncology | Admitting: Radiation Oncology

## 2012-11-15 ENCOUNTER — Ambulatory Visit: Payer: BC Managed Care – PPO

## 2012-11-15 ENCOUNTER — Other Ambulatory Visit: Payer: BC Managed Care – PPO | Admitting: Lab

## 2012-11-15 ENCOUNTER — Ambulatory Visit: Payer: BC Managed Care – PPO | Admitting: Internal Medicine

## 2012-11-15 DIAGNOSIS — C3491 Malignant neoplasm of unspecified part of right bronchus or lung: Secondary | ICD-10-CM

## 2012-11-15 MED ORDER — SILVER SULFADIAZINE 1 % EX CREA
TOPICAL_CREAM | Freq: Two times a day (BID) | CUTANEOUS | Status: DC
  Administered 2012-11-15: 18:00:00 via TOPICAL

## 2012-11-15 NOTE — Progress Notes (Signed)
Linda Donovan came to the clinic with her husband.  She was given biafine cream yesterday and she says that it is burning and the skin on her back is peeling.  She was told that if the biafine did not work she could try Silvadene.  The skin on her right back is red and there are two peeling areas.  She was given Silvadene per Dr. Roselind Messier and was instructed to apply it after treatment and at bedtime.

## 2012-11-16 ENCOUNTER — Ambulatory Visit: Payer: BC Managed Care – PPO

## 2012-11-16 ENCOUNTER — Ambulatory Visit
Admission: RE | Admit: 2012-11-16 | Discharge: 2012-11-16 | Disposition: A | Discharge status: Home / Self Care | Payer: BC Managed Care – PPO | Source: Ambulatory Visit | Attending: Radiation Oncology | Admitting: Radiation Oncology

## 2012-11-17 ENCOUNTER — Ambulatory Visit
Admission: RE | Admit: 2012-11-17 | Discharge: 2012-11-17 | Disposition: A | Discharge status: Home / Self Care | Payer: BC Managed Care – PPO | Source: Ambulatory Visit | Attending: Radiation Oncology | Admitting: Radiation Oncology

## 2012-11-20 ENCOUNTER — Telehealth: Payer: Self-pay | Admitting: *Deleted

## 2012-11-22 ENCOUNTER — Telehealth: Payer: Self-pay | Admitting: Radiation Oncology

## 2012-11-22 ENCOUNTER — Encounter: Payer: Self-pay | Admitting: Radiation Oncology

## 2012-11-22 NOTE — Telephone Encounter (Signed)
Patient completed radiation therapy to her lung just five days ago. Called concerned about skin changes. Advised patient that her skin could continue to change for up to two weeks following treatment. Advised patient to continue silvadene a thin layer bid as directed. Encouraged patient to contact staff if her skin did not appear to be improving within the next 48 hours to schedule a skin check. Patient verbalized understanding.

## 2012-11-22 NOTE — Progress Notes (Signed)
  Radiation Oncology         [phone] ________________________________  Name: Linda Donovan MRN: [id number]  Date: 11/22/2012  DOB: [birth year]  End of Treatment Note  Diagnosis:    Stage IIIa (T3, N2, M0) non-small cell lung cancer    Indication for treatment:  Definitive treatment along with radiosensitizing chemotherapy       Radiation treatment dates:   09/28/2012 through 11/17/2012  Site/dose:   Right upper lung mass and mediastinum chest, 63 Gy in 35 fractions  Beams/energy:   Patient was treated with a 5 field 3-D conformal plan  Narrative: The patient tolerated radiation treatment relatively well. Towards the end of her therapy she had some mild esophageal symptoms. She also experienced a brisk reaction in the skin of the right upper back. This was initially treated with Biafine and then Silvadene.  Her weight remained relatively stable throughout the course of treatment  Plan: The patient has completed radiation treatment. The patient will return to radiation oncology clinic for routine followup in one month or sooner if her skin reaction is not improved in the next few days. I advised them to call or return sooner if they have any questions or concerns related to their recovery or treatment.  -----------------------------------  Billie Lade, PhD, MD

## 2012-11-27 ENCOUNTER — Telehealth: Payer: Self-pay | Admitting: Radiation Oncology

## 2012-11-27 NOTE — Telephone Encounter (Signed)
Phoned patient to assess status. Patient reports that once she stopped using the silvadene and radiaplex her skin began to dry out and heal. Denies known allergy to sulfa. Encouraged patient that once skin is closed and healed to begin using OTC lotion such as coco butter or vitamin e so long as she was not allergic. She verbalized understanding and expressed appreciation for the call. Encouraged to contact staff with future needs.

## 2012-12-06 NOTE — Telephone Encounter (Signed)
error 

## 2012-12-22 ENCOUNTER — Ambulatory Visit (HOSPITAL_COMMUNITY)
Admission: RE | Admit: 2012-12-22 | Discharge: 2012-12-22 | Disposition: A | Discharge status: Home / Self Care | Payer: BC Managed Care – PPO | Source: Ambulatory Visit | Attending: Internal Medicine | Admitting: Internal Medicine

## 2012-12-22 ENCOUNTER — Encounter (HOSPITAL_COMMUNITY): Payer: Self-pay

## 2012-12-22 ENCOUNTER — Other Ambulatory Visit (HOSPITAL_BASED_OUTPATIENT_CLINIC_OR_DEPARTMENT_OTHER): Payer: BC Managed Care – PPO | Admitting: Lab

## 2012-12-22 DIAGNOSIS — R599 Enlarged lymph nodes, unspecified: Secondary | ICD-10-CM | POA: Insufficient documentation

## 2012-12-22 DIAGNOSIS — C3490 Malignant neoplasm of unspecified part of unspecified bronchus or lung: Secondary | ICD-10-CM

## 2012-12-22 DIAGNOSIS — R928 Other abnormal and inconclusive findings on diagnostic imaging of breast: Secondary | ICD-10-CM | POA: Insufficient documentation

## 2012-12-22 DIAGNOSIS — Z9221 Personal history of antineoplastic chemotherapy: Secondary | ICD-10-CM | POA: Insufficient documentation

## 2012-12-22 DIAGNOSIS — Z923 Personal history of irradiation: Secondary | ICD-10-CM | POA: Insufficient documentation

## 2012-12-22 DIAGNOSIS — J438 Other emphysema: Secondary | ICD-10-CM | POA: Insufficient documentation

## 2012-12-22 LAB — COMPREHENSIVE METABOLIC PANEL (CC13)
Alkaline Phosphatase: 96 U/L (ref 40–150)
Glucose: 119 mg/dl (ref 70–140)
Sodium: 139 mEq/L (ref 136–145)
Total Bilirubin: 0.57 mg/dL (ref 0.20–1.20)
Total Protein: 7.2 g/dL (ref 6.4–8.3)

## 2012-12-22 LAB — CBC WITH DIFFERENTIAL/PLATELET
BASO%: 0.6 % (ref 0.0–2.0)
EOS%: 6.9 % (ref 0.0–7.0)
LYMPH%: 17.8 % (ref 14.0–49.7)
MCH: 34.8 pg — ABNORMAL HIGH (ref 25.1–34.0)
MCHC: 34.4 g/dL (ref 31.5–36.0)
MCV: 101.2 fL — ABNORMAL HIGH (ref 79.5–101.0)
MONO%: 14.4 % — ABNORMAL HIGH (ref 0.0–14.0)
Platelets: 300 10*3/uL (ref 145–400)
RBC: 4.08 10*6/uL (ref 3.70–5.45)
RDW: 15.3 % — ABNORMAL HIGH (ref 11.2–14.5)

## 2012-12-22 MED ORDER — IOHEXOL 300 MG/ML  SOLN
80.0000 mL | Freq: Once | INTRAMUSCULAR | Status: AC | PRN
  Administered 2012-12-22: 80 mL via INTRAVENOUS

## 2012-12-26 ENCOUNTER — Other Ambulatory Visit: Payer: BC Managed Care – PPO | Admitting: Lab

## 2012-12-26 ENCOUNTER — Telehealth: Payer: Self-pay | Admitting: *Deleted

## 2012-12-26 ENCOUNTER — Ambulatory Visit (HOSPITAL_BASED_OUTPATIENT_CLINIC_OR_DEPARTMENT_OTHER): Payer: BC Managed Care – PPO | Admitting: Internal Medicine

## 2012-12-26 ENCOUNTER — Encounter: Payer: Self-pay | Admitting: Internal Medicine

## 2012-12-26 ENCOUNTER — Telehealth: Payer: Self-pay | Admitting: Internal Medicine

## 2012-12-26 VITALS — BP 157/89 | HR 121 | Temp 97.8°F | Resp 18 | Ht 63.0 in | Wt 131.3 lb

## 2012-12-26 DIAGNOSIS — C3490 Malignant neoplasm of unspecified part of unspecified bronchus or lung: Secondary | ICD-10-CM

## 2012-12-26 DIAGNOSIS — C3491 Malignant neoplasm of unspecified part of right bronchus or lung: Secondary | ICD-10-CM

## 2012-12-26 NOTE — Progress Notes (Signed)
[facility] Telephone:(336) [phone]   Fax:(336) [phone]  OFFICE PROGRESS NOTE  DIAGNOSIS: Stage IIIa non-small cell lung cancer, adenocarcinoma with negative EGFR mutation and negative ALK gene translocation diagnosed in March of 2014   PRIOR THERAPY: Concurrent chemoradiation with weekly carboplatin for AUC of 2 and paclitaxel 45 mg/M2, status post 8 cycles, last dose was given on 11/13/2012 with partial response.  CURRENT THERAPY: The patient will start next week the first cycle of consolidation chemotherapy with carboplatin for AUC of 5 on day 1 and gemcitabine 1000 mg/M2 on days 1 and 8 every 3 weeks.   INTERVAL HISTORY: Linda Donovan 57 y.o. female returns to the clinic today for followup visit. She is feeling fine today with no specific complaints. She denied having any significant chest pain, shortness of breath, cough or hemoptysis. She continues to have mild fatigue. The patient denied having any significant weight loss or night sweats. She had repeat CT scan of the chest performed recently and she is here for evaluation and discussion of her scan results.   MEDICAL HISTORY: Past Medical History  Diagnosis Date  . COPD (chronic obstructive pulmonary disease)   . ADD (attention deficit disorder)   . Non-small cell lung cancer 09/18/2012    ALLERGIES:  is allergic to clinoril and morphine and related.  MEDICATIONS:  Current Outpatient Prescriptions  Medication Sig Dispense Refill  . albuterol (PROVENTIL HFA;VENTOLIN HFA) 108 (90 BASE) MCG/ACT inhaler Inhale 2 puffs into the lungs every 6 (six) hours as needed for wheezing.      Marland Kitchen amphetamine-dextroamphetamine (ADDERALL) 20 MG tablet Take 20 mg by mouth daily before breakfast.       . Ascorbic Acid (VITAMIN C) 1000 MG tablet Take 1,000 mg by mouth daily. Taking emergen-c powder      . aspirin 81 MG tablet Take 81 mg by mouth daily.      . calcium-vitamin D (OSCAL WITH D) 250-125 MG-UNIT per tablet Take 1  tablet by mouth daily.      Marland Kitchen loratadine (CLARITIN) 10 MG tablet Take 10 mg by mouth daily as needed.       . mometasone (NASONEX) 50 MCG/ACT nasal spray Place 2 sprays into the nose daily as needed.       . Multiple Vitamins-Minerals (MULTIVITAMIN WITH MINERALS) tablet Take 1 tablet by mouth daily.      . traZODone (DESYREL) 50 MG tablet Take 50 mg by mouth at bedtime.      Marland Kitchen zinc gluconate 50 MG tablet Take 50 mg by mouth daily.      Marland Kitchen HYDROcodone-acetaminophen (HYCET) 7.5-325 mg/15 ml solution Take 15 mLs by mouth every 4 (four) hours as needed for pain.  120 mL  0  . prochlorperazine (COMPAZINE) 10 MG tablet Take 1 tablet (10 mg total) by mouth every 6 (six) hours as needed.  60 tablet  0  . sucralfate (CARAFATE) 1 GM/10ML suspension Take 10 mLs (1 g total) by mouth 4 (four) times daily.  420 mL  2   No current facility-administered medications for this visit.    SURGICAL HISTORY:  Past Surgical History  Procedure Laterality Date  . Total abdominal hysterectomy    . Shoulder arthroscopy    . Video bronchoscopy Bilateral 07/25/2012    Procedure: VIDEO BRONCHOSCOPY WITH FLUORO;  Surgeon: Nyoka Cowden, MD;  Location: [facility];  Service: Endoscopy;  Laterality: Bilateral;  . Lump removed from breasr right  2003  . Endobronchial ultrasound  Bilateral 09/04/2012    Procedure: ENDOBRONCHIAL ULTRASOUND;  Surgeon: Leslye Peer, MD;  Location: [facility];  Service: Cardiopulmonary;  Laterality: Bilateral;    REVIEW OF SYSTEMS:  A comprehensive review of systems was negative except for: Constitutional: positive for fatigue   PHYSICAL EXAMINATION: General appearance: alert, cooperative and no distress Head: Normocephalic, without obvious abnormality, atraumatic Neck: no adenopathy Lymph nodes: Cervical, supraclavicular, and axillary nodes normal. Resp: clear to auscultation bilaterally Cardio: regular rate and rhythm, S1, S2 normal, no murmur, click, rub or gallop GI: soft,  non-tender; bowel sounds normal; no masses,  no organomegaly Extremities: extremities normal, atraumatic, no cyanosis or edema Neurologic: Alert and oriented X 3, normal strength and tone. Normal symmetric reflexes. Normal coordination and gait  ECOG PERFORMANCE STATUS: 1 - Symptomatic but completely ambulatory  Blood pressure 157/89, pulse 121, temperature 97.8 F (36.6 C), temperature source Oral, resp. rate 18, height 5\' 3"  (1.6 m), weight 131 lb 4.8 oz (59.557 kg).  LABORATORY DATA: Lab Results  Component Value Date   WBC 6.2 12/22/2012   HGB 14.2 12/22/2012   HCT 41.3 12/22/2012   MCV 101.2* 12/22/2012   PLT 300 12/22/2012      Chemistry      Component Value Date/Time   NA 139 12/22/2012 1012   NA 138 09/04/2012 0815   K 4.3 12/22/2012 1012   K 3.4* 09/04/2012 0815   CL 104 11/13/2012 1122   CL 100 09/04/2012 0815   CO2 26 12/22/2012 1012   CO2 28 09/04/2012 0815   BUN 6.2* 12/22/2012 1012   BUN 6 09/04/2012 0815   CREATININE 0.7 12/22/2012 1012   CREATININE 0.65 09/04/2012 0815      Component Value Date/Time   CALCIUM 9.2 12/22/2012 1012   CALCIUM 9.3 09/04/2012 0815   ALKPHOS 96 12/22/2012 1012   ALKPHOS 90 09/04/2012 0815   AST 21 12/22/2012 1012   AST 22 09/04/2012 0815   ALT 17 12/22/2012 1012   ALT 16 09/04/2012 0815   BILITOT 0.57 12/22/2012 1012   BILITOT 0.6 09/04/2012 0815       RADIOGRAPHIC STUDIES: Ct Chest W Contrast  12/22/2012   *RADIOLOGY REPORT*  Clinical Data: Right-sided non-small cell cancer diagnosed in April, 2014.  Chest pain.  Cough. Shortness of breath. Chemotherapy and radiation therapy completed.  CT CHEST WITH CONTRAST  Technique:  Multidetector CT imaging of the chest was performed following the standard protocol during bolus administration of intravenous contrast.  Contrast: 80mL OMNIPAQUE IOHEXOL 300 MG/ML  SOLN  Comparison: 08/04/2012  Findings: Spiculated right upper lobe mass measures 6.4 x 3.6 cm (formerly 7.8 x 5.1 cm) and has irregular spiculated  margins and to likely some new postobstructive atelectasis in the right upper lobe.  Severe emphysema noted.  The mass continues to extend to the margin of the major fissure.  Scattered scarring noted in both lungs. Stable ill-defined density anteriorly in the right upper lobe is likely partially due to scarring, although a component may be due to mild radiation pneumonitis.  Small subpleural lymph nodes are noted inferiorly along the right major fissure.  Subcarinal node 2.7 cm in short axis, previously 3.1 cm by my measurement  Stable appearance of rim calcifications in the breast glandular tissue (right greater than left) and in the subpectoral tissues, left greater than right.  Query residua from of fat necrosis. Right breast glandular tissue is more prominent than the left.  IMPRESSION:  1.  Reduced size of the large right upper lobe  mass and of the pathologic subcarinal adenopathy following chemotherapy and radiation therapy. 2.  Indistinct density anteriorly in the right upper lobe is likely due to radiation therapy and scarring, but merits observation. 3.  Markedly severe emphysema. 4.  Rim calcified cystic lesions in the breast parenchymal tissue and subpectoral regions, left greater than right.  Typically such a rim calcifications are noted in the setting of benign fat necrosis.   Original Report Authenticated By: Gaylyn Rong, M.D.    ASSESSMENT AND PLAN: This is a very pleasant 57 years old white female with history of stage IIIA non-small cell lung cancer, adenocarcinoma status post course of concurrent chemoradiation with weekly carboplatin and paclitaxel with partial response. I discussed the scan results with the patient today. I recommended for her to consider consolidation chemotherapy. I discussed with the patient several options for her treatment including consolidation chemotherapy with carboplatin and paclitaxel but the patient has concern about alopecia with this regimen.  I  discussed with the patient other treatment options including systemic chemotherapy with carboplatin and gemcitabine. She would be a good candidate for treatment with carboplatin and Alimta, but I would keep this regimen as an alternative options in the future if she has evidence for disease progression. The patient expected to start the first cycle of her treatment with carboplatin and gemcitabine next week. I discussed with the patient adverse effect of this treatment including but not limited to mild alopecia, myelosuppression, nausea and vomiting, liver or renal dysfunction. She would like to proceed with treatment as planned. She would come back for followup visit in 4 weeks with the start of cycle #2. She was advised to call immediately if she has any concerning symptoms in the interval.  All questions were answered. The patient knows to call the clinic with any problems, questions or concerns. We can certainly see the patient much sooner if necessary.  I spent 15 minutes counseling the patient face to face. The total time spent in the appointment was 25 minutes.

## 2012-12-26 NOTE — Telephone Encounter (Signed)
gv and printed appt sched and avs...MW added tx. °

## 2012-12-26 NOTE — Patient Instructions (Signed)
The scan showed improvement in her disease. We discussed consolidation chemotherapy first dose next week. Followup visit in 4 weeks

## 2012-12-26 NOTE — Telephone Encounter (Signed)
Per staff message and POF I have scheduled appts.  JMW  

## 2012-12-28 ENCOUNTER — Encounter: Payer: Self-pay | Admitting: Radiation Oncology

## 2012-12-28 ENCOUNTER — Ambulatory Visit
Admission: RE | Admit: 2012-12-28 | Discharge: 2012-12-28 | Disposition: A | Discharge status: Home / Self Care | Payer: BC Managed Care – PPO | Source: Ambulatory Visit | Attending: Radiation Oncology | Admitting: Radiation Oncology

## 2012-12-28 VITALS — BP 118/71 | HR 113 | Temp 97.7°F | Resp 20 | Wt 131.8 lb

## 2012-12-28 DIAGNOSIS — C3491 Malignant neoplasm of unspecified part of right bronchus or lung: Secondary | ICD-10-CM

## 2012-12-28 NOTE — Progress Notes (Signed)
Pt denies pain, difficulty eating, swallowing, cough. She is fatigued, has loss of appetite, SOB w/exertion. Pt states she had skin reaction on upper back form radiation but is healed.

## 2012-12-28 NOTE — Progress Notes (Signed)
Radiation Oncology         [phone] ________________________________  Name: Linda Donovan MRN: [id number]  Date: 12/28/2012  DOB: 09/14/1955  Follow-Up Visit Note  CC: Pcp Not In System  Si Gaul, MD  Diagnosis:   Stage IIIa non-small cell lung cancer  Interval Since Last Radiation:  1  months  Narrative:  The patient returns today for routine follow-up.  She is doing reasonably well at this time. She continues to have some fatigue and some dyspnea with exertion. She denies any significant cough or hemoptysis.. She denies any pain within the chest area. She continues to work full-time. The patient did undergo CT scan recently with a partial response to her therapy. Patient will proceed with additional chemotherapy.  today the patient complains of some dizziness. She denies any blurred vision double vision or headaches.                              ALLERGIES:  is allergic to clinoril and morphine and related.  Meds: Current Outpatient Prescriptions  Medication Sig Dispense Refill  . albuterol (PROVENTIL HFA;VENTOLIN HFA) 108 (90 BASE) MCG/ACT inhaler Inhale 2 puffs into the lungs every 6 (six) hours as needed for wheezing.      Marland Kitchen amphetamine-dextroamphetamine (ADDERALL) 20 MG tablet Take 20 mg by mouth daily before breakfast.       . Ascorbic Acid (VITAMIN C) 1000 MG tablet Take 1,000 mg by mouth daily. Taking emergen-c powder      . aspirin 81 MG tablet Take 81 mg by mouth daily.      . calcium-vitamin D (OSCAL WITH D) 250-125 MG-UNIT per tablet Take 1 tablet by mouth daily.      Marland Kitchen HYDROcodone-acetaminophen (HYCET) 7.5-325 mg/15 ml solution Take 15 mLs by mouth every 4 (four) hours as needed for pain.  120 mL  0  . loratadine (CLARITIN) 10 MG tablet Take 10 mg by mouth daily as needed.       . mometasone (NASONEX) 50 MCG/ACT nasal spray Place 2 sprays into the nose daily as needed.       . Multiple Vitamins-Minerals (MULTIVITAMIN WITH MINERALS) tablet Take 1 tablet by mouth  daily.      . prochlorperazine (COMPAZINE) 10 MG tablet Take 1 tablet (10 mg total) by mouth every 6 (six) hours as needed.  60 tablet  0  . sucralfate (CARAFATE) 1 GM/10ML suspension Take 10 mLs (1 g total) by mouth 4 (four) times daily.  420 mL  2  . traZODone (DESYREL) 50 MG tablet Take 50 mg by mouth at bedtime.      Marland Kitchen zinc gluconate 50 MG tablet Take 50 mg by mouth daily.       No current facility-administered medications for this encounter.    Physical Findings: The patient is in no acute distress. Patient is alert and oriented.  weight is 131 lb 12.8 oz (59.784 kg). Her oral temperature is 97.7 F (36.5 C). Her blood pressure is 118/71 and her pulse is 113. Her respiration is 20 and oxygen saturation is 94%. . No palpable cervical supraclavicular or axillary adenopathy. The lungs are clear to auscultation. The heart has a regular rhythm and rate. Examination of the Actiq area reveals the patient's skin to be well healed at this time. There is mild hyperpigmentation changes.. A complete neurological exam is performed and there no neurologic deficits noted. Lab Findings: Lab Results  Component Value Date  WBC 6.2 12/22/2012   HGB 14.2 12/22/2012   HCT 41.3 12/22/2012   MCV 101.2* 12/22/2012   PLT 300 12/22/2012      Radiographic Findings: Ct Chest W Contrast  12/22/2012   *RADIOLOGY REPORT*  Clinical Data: Right-sided non-small cell cancer diagnosed in April, 2014.  Chest pain.  Cough. Shortness of breath. Chemotherapy and radiation therapy completed.  CT CHEST WITH CONTRAST  Technique:  Multidetector CT imaging of the chest was performed following the standard protocol during bolus administration of intravenous contrast.  Contrast: 80mL OMNIPAQUE IOHEXOL 300 MG/ML  SOLN  Comparison: 08/04/2012  Findings: Spiculated right upper lobe mass measures 6.4 x 3.6 cm (formerly 7.8 x 5.1 cm) and has irregular spiculated margins and to likely some new postobstructive atelectasis in the right upper  lobe.  Severe emphysema noted.  The mass continues to extend to the margin of the major fissure.  Scattered scarring noted in both lungs. Stable ill-defined density anteriorly in the right upper lobe is likely partially due to scarring, although a component may be due to mild radiation pneumonitis.  Small subpleural lymph nodes are noted inferiorly along the right major fissure.  Subcarinal node 2.7 cm in short axis, previously 3.1 cm by my measurement  Stable appearance of rim calcifications in the breast glandular tissue (right greater than left) and in the subpectoral tissues, left greater than right.  Query residua from of fat necrosis. Right breast glandular tissue is more prominent than the left.  IMPRESSION:  1.  Reduced size of the large right upper lobe mass and of the pathologic subcarinal adenopathy following chemotherapy and radiation therapy. 2.  Indistinct density anteriorly in the right upper lobe is likely due to radiation therapy and scarring, but merits observation. 3.  Markedly severe emphysema. 4.  Rim calcified cystic lesions in the breast parenchymal tissue and subpectoral regions, left greater than right.  Typically such a rim calcifications are noted in the setting of benign fat necrosis.   Original Report Authenticated By: Gaylyn Rong, M.D.    Impression:  The patient is recovering from the effects of radiation.  As above patient proceed with additional chemotherapy  Plan:  Routine followup in 4 months. Have asked patient to let me or Dr. Arbutus Ped know if her dizziness continues to be a problem.  _____________________________________  -----------------------------------  Billie Lade, PhD, MD

## 2013-01-02 ENCOUNTER — Encounter: Payer: Self-pay | Admitting: *Deleted

## 2013-01-02 ENCOUNTER — Other Ambulatory Visit (HOSPITAL_BASED_OUTPATIENT_CLINIC_OR_DEPARTMENT_OTHER): Payer: BC Managed Care – PPO | Admitting: Lab

## 2013-01-02 ENCOUNTER — Ambulatory Visit (HOSPITAL_BASED_OUTPATIENT_CLINIC_OR_DEPARTMENT_OTHER): Payer: BC Managed Care – PPO

## 2013-01-02 VITALS — BP 140/88 | HR 120 | Temp 97.2°F | Resp 20

## 2013-01-02 DIAGNOSIS — C3491 Malignant neoplasm of unspecified part of right bronchus or lung: Secondary | ICD-10-CM

## 2013-01-02 DIAGNOSIS — Z5111 Encounter for antineoplastic chemotherapy: Secondary | ICD-10-CM

## 2013-01-02 DIAGNOSIS — C3410 Malignant neoplasm of upper lobe, unspecified bronchus or lung: Secondary | ICD-10-CM

## 2013-01-02 LAB — COMPREHENSIVE METABOLIC PANEL (CC13)
BUN: 10.9 mg/dL (ref 7.0–26.0)
CO2: 25 mEq/L (ref 22–29)
Creatinine: 0.6 mg/dL (ref 0.6–1.1)
Glucose: 108 mg/dl (ref 70–140)
Total Bilirubin: 0.48 mg/dL (ref 0.20–1.20)

## 2013-01-02 LAB — CBC WITH DIFFERENTIAL/PLATELET
Basophils Absolute: 0 10*3/uL (ref 0.0–0.1)
Eosinophils Absolute: 0.3 10*3/uL (ref 0.0–0.5)
LYMPH%: 16.6 % (ref 14.0–49.7)
MCV: 100.9 fL (ref 79.5–101.0)
MONO%: 12.2 % (ref 0.0–14.0)
NEUT#: 4.6 10*3/uL (ref 1.5–6.5)
Platelets: 272 10*3/uL (ref 145–400)
RBC: 4.42 10*6/uL (ref 3.70–5.45)
nRBC: 0 % (ref 0–0)

## 2013-01-02 MED ORDER — SODIUM CHLORIDE 0.9 % IV SOLN
1000.0000 mg/m2 | Freq: Once | INTRAVENOUS | Status: AC
  Administered 2013-01-02: 1634 mg via INTRAVENOUS
  Filled 2013-01-02: qty 43

## 2013-01-02 MED ORDER — SODIUM CHLORIDE 0.9 % IV SOLN
Freq: Once | INTRAVENOUS | Status: AC
  Administered 2013-01-02: 09:00:00 via INTRAVENOUS

## 2013-01-02 MED ORDER — ONDANSETRON 16 MG/50ML IVPB (CHCC)
16.0000 mg | Freq: Once | INTRAVENOUS | Status: AC
  Administered 2013-01-02: 16 mg via INTRAVENOUS

## 2013-01-02 MED ORDER — SODIUM CHLORIDE 0.9 % IV SOLN
537.0000 mg | Freq: Once | INTRAVENOUS | Status: AC
  Administered 2013-01-02: 540 mg via INTRAVENOUS
  Filled 2013-01-02: qty 54

## 2013-01-02 MED ORDER — DEXAMETHASONE SODIUM PHOSPHATE 20 MG/5ML IJ SOLN
20.0000 mg | Freq: Once | INTRAMUSCULAR | Status: AC
  Administered 2013-01-02: 20 mg via INTRAVENOUS

## 2013-01-02 NOTE — Patient Instructions (Signed)
[facility] Discharge Instructions for Patients Receiving Chemotherapy  Today you received the following chemotherapy agents :  Gemcitabine, Carboplatin.  To help prevent nausea and vomiting after your treatment, we encourage you to take your nausea medication as instructed by your physician.  Take Compazine 10 mg every 6 hrs as needed for nausea.  DO NOT Drive after taking Compazine as it can cause drowsiness.  DO Drink lots of fluids as tolerated.  DO NOT Eat greasy nor spicy foods.   If you develop nausea and vomiting that is not controlled by your nausea medication, call the clinic.   BELOW ARE SYMPTOMS THAT SHOULD BE REPORTED IMMEDIATELY:  *FEVER GREATER THAN 100.5 F  *CHILLS WITH OR WITHOUT FEVER  NAUSEA AND VOMITING THAT IS NOT CONTROLLED WITH YOUR NAUSEA MEDICATION  *UNUSUAL SHORTNESS OF BREATH  *UNUSUAL BRUISING OR BLEEDING  TENDERNESS IN MOUTH AND THROAT WITH OR WITHOUT PRESENCE OF ULCERS  *URINARY PROBLEMS  *BOWEL PROBLEMS  UNUSUAL RASH Items with * indicate a potential emergency and should be followed up as soon as possible.  Feel free to call the clinic you have any questions or concerns. The clinic phone number is [phone].

## 2013-01-03 ENCOUNTER — Telehealth: Payer: Self-pay | Admitting: *Deleted

## 2013-01-03 NOTE — Telephone Encounter (Signed)
Called Linda Donovan for chemotherapy F/U.  Patient is doing well.  Denies n/v.  Denies any new side effects or symptoms.  Bowel and bladder is functioning well.  Eating and drinking well and I instructed to drink 64 oz minimum daily or at least the day before, of and after treatment.  Denies questions at this time and encouraged to call if needed.  Reviewed how to call after hours in the case of an emergency.

## 2013-01-03 NOTE — Telephone Encounter (Signed)
Message copied by Augusto Garbe on Wed Jan 03, 2013 10:26 AM ------      Message from: Normajean Baxter LE      Created: Tue Jan 02, 2013  1:17 PM      Regarding: Chemo f/u call       First time Gemzar, Carboplatin, Dr. Donnald Garre, Richrd Prime, RN ------

## 2013-01-09 ENCOUNTER — Other Ambulatory Visit (HOSPITAL_BASED_OUTPATIENT_CLINIC_OR_DEPARTMENT_OTHER): Payer: BC Managed Care – PPO | Admitting: Lab

## 2013-01-09 ENCOUNTER — Ambulatory Visit (HOSPITAL_BASED_OUTPATIENT_CLINIC_OR_DEPARTMENT_OTHER): Payer: BC Managed Care – PPO

## 2013-01-09 VITALS — BP 131/86 | HR 91 | Temp 97.0°F | Resp 18

## 2013-01-09 DIAGNOSIS — C3491 Malignant neoplasm of unspecified part of right bronchus or lung: Secondary | ICD-10-CM

## 2013-01-09 DIAGNOSIS — C3410 Malignant neoplasm of upper lobe, unspecified bronchus or lung: Secondary | ICD-10-CM

## 2013-01-09 DIAGNOSIS — Z5111 Encounter for antineoplastic chemotherapy: Secondary | ICD-10-CM

## 2013-01-09 LAB — COMPREHENSIVE METABOLIC PANEL (CC13)
ALT: 26 U/L (ref 0–55)
AST: 26 U/L (ref 5–34)
Alkaline Phosphatase: 109 U/L (ref 40–150)
CO2: 25 mEq/L (ref 22–29)
Sodium: 143 mEq/L (ref 136–145)
Total Bilirubin: 0.42 mg/dL (ref 0.20–1.20)
Total Protein: 7.7 g/dL (ref 6.4–8.3)

## 2013-01-09 LAB — CBC WITH DIFFERENTIAL/PLATELET
BASO%: 0.3 % (ref 0.0–2.0)
Eosinophils Absolute: 0.1 10*3/uL (ref 0.0–0.5)
MCHC: 34.8 g/dL (ref 31.5–36.0)
MCV: 98.2 fL (ref 79.5–101.0)
MONO%: 2.6 % (ref 0.0–14.0)
NEUT#: 2.7 10*3/uL (ref 1.5–6.5)
RBC: 4.42 10*6/uL (ref 3.70–5.45)
RDW: 12.1 % (ref 11.2–14.5)
WBC: 3.9 10*3/uL (ref 3.9–10.3)
nRBC: 0 % (ref 0–0)

## 2013-01-09 MED ORDER — PROCHLORPERAZINE MALEATE 10 MG PO TABS
10.0000 mg | ORAL_TABLET | Freq: Once | ORAL | Status: AC
  Administered 2013-01-09: 10 mg via ORAL

## 2013-01-09 MED ORDER — SODIUM CHLORIDE 0.9 % IV SOLN
Freq: Once | INTRAVENOUS | Status: AC
  Administered 2013-01-09: 10:00:00 via INTRAVENOUS

## 2013-01-09 MED ORDER — SODIUM CHLORIDE 0.9 % IV SOLN
1000.0000 mg/m2 | Freq: Once | INTRAVENOUS | Status: AC
  Administered 2013-01-09: 1634 mg via INTRAVENOUS
  Filled 2013-01-09: qty 43

## 2013-01-09 NOTE — Patient Instructions (Addendum)
[facility] Discharge Instructions for Patients Receiving Chemotherapy  Today you received the following chemotherapy agents Gemzar.  To help prevent nausea and vomiting after your treatment, we encourage you to take your nausea medication as prescribed.   If you develop nausea and vomiting that is not controlled by your nausea medication, call the clinic.   BELOW ARE SYMPTOMS THAT SHOULD BE REPORTED IMMEDIATELY:  *FEVER GREATER THAN 100.5 F  *CHILLS WITH OR WITHOUT FEVER  NAUSEA AND VOMITING THAT IS NOT CONTROLLED WITH YOUR NAUSEA MEDICATION  *UNUSUAL SHORTNESS OF BREATH  *UNUSUAL BRUISING OR BLEEDING  TENDERNESS IN MOUTH AND THROAT WITH OR WITHOUT PRESENCE OF ULCERS  *URINARY PROBLEMS  *BOWEL PROBLEMS  UNUSUAL RASH Items with * indicate a potential emergency and should be followed up as soon as possible.  Feel free to call the clinic you have any questions or concerns. The clinic phone number is [phone].    

## 2013-01-16 ENCOUNTER — Other Ambulatory Visit (HOSPITAL_BASED_OUTPATIENT_CLINIC_OR_DEPARTMENT_OTHER): Payer: BC Managed Care – PPO | Admitting: Lab

## 2013-01-16 ENCOUNTER — Telehealth: Payer: Self-pay | Admitting: Medical Oncology

## 2013-01-16 DIAGNOSIS — C3491 Malignant neoplasm of unspecified part of right bronchus or lung: Secondary | ICD-10-CM

## 2013-01-16 DIAGNOSIS — C3490 Malignant neoplasm of unspecified part of unspecified bronchus or lung: Secondary | ICD-10-CM

## 2013-01-16 LAB — CBC WITH DIFFERENTIAL/PLATELET
BASO%: 0 % (ref 0.0–2.0)
EOS%: 1.7 % (ref 0.0–7.0)
LYMPH%: 59.5 % — ABNORMAL HIGH (ref 14.0–49.7)
MCHC: 34.7 g/dL (ref 31.5–36.0)
MCV: 96.5 fL (ref 79.5–101.0)
MONO%: 0.8 % (ref 0.0–14.0)
Platelets: 18 10*3/uL — ABNORMAL LOW (ref 145–400)
RBC: 3.7 10*6/uL (ref 3.70–5.45)
WBC: 1.2 10*3/uL — ABNORMAL LOW (ref 3.9–10.3)
nRBC: 0 % (ref 0–0)

## 2013-01-16 LAB — COMPREHENSIVE METABOLIC PANEL (CC13)
ALT: 36 U/L (ref 0–55)
AST: 32 U/L (ref 5–34)
Creatinine: 0.7 mg/dL (ref 0.6–1.1)
Total Bilirubin: 0.7 mg/dL (ref 0.20–1.20)

## 2013-01-16 NOTE — Telephone Encounter (Signed)
Notified patient of Neutrapenic and bleeding precautions- patient voices understanding.

## 2013-01-17 ENCOUNTER — Telehealth: Payer: Self-pay | Admitting: *Deleted

## 2013-01-17 NOTE — Telephone Encounter (Signed)
Pt's daughter called Meriam Sprague in the lab wanting to arrange to donate blood products for the patient.  Form from [employer] obtained by Meriam Sprague and dropped off at [employer].  Pt is not in need of a transfusion at this time so their would be no need to fill out at the form now.  Called and spoke to patient.  Informed her that in the event she would need a transfusion our blood bank can supply the blood products that she needs.  Will keep form at RN desk for the future if needed.  Pt sates she will speak with her daughter.  SLJ

## 2013-01-23 ENCOUNTER — Ambulatory Visit: Payer: BC Managed Care – PPO

## 2013-01-23 ENCOUNTER — Ambulatory Visit (HOSPITAL_BASED_OUTPATIENT_CLINIC_OR_DEPARTMENT_OTHER): Payer: BC Managed Care – PPO | Admitting: Internal Medicine

## 2013-01-23 ENCOUNTER — Other Ambulatory Visit: Payer: BC Managed Care – PPO | Admitting: Lab

## 2013-01-23 ENCOUNTER — Encounter: Payer: Self-pay | Admitting: Internal Medicine

## 2013-01-23 ENCOUNTER — Telehealth: Payer: Self-pay | Admitting: *Deleted

## 2013-01-23 ENCOUNTER — Telehealth: Payer: Self-pay | Admitting: Internal Medicine

## 2013-01-23 ENCOUNTER — Other Ambulatory Visit (HOSPITAL_BASED_OUTPATIENT_CLINIC_OR_DEPARTMENT_OTHER): Payer: BC Managed Care – PPO | Admitting: Lab

## 2013-01-23 VITALS — BP 130/83 | HR 113 | Temp 97.0°F | Resp 20 | Ht 63.0 in | Wt 136.0 lb

## 2013-01-23 DIAGNOSIS — C3410 Malignant neoplasm of upper lobe, unspecified bronchus or lung: Secondary | ICD-10-CM

## 2013-01-23 DIAGNOSIS — R5381 Other malaise: Secondary | ICD-10-CM

## 2013-01-23 DIAGNOSIS — C3491 Malignant neoplasm of unspecified part of right bronchus or lung: Secondary | ICD-10-CM

## 2013-01-23 DIAGNOSIS — C3490 Malignant neoplasm of unspecified part of unspecified bronchus or lung: Secondary | ICD-10-CM

## 2013-01-23 DIAGNOSIS — J449 Chronic obstructive pulmonary disease, unspecified: Secondary | ICD-10-CM

## 2013-01-23 LAB — CBC WITH DIFFERENTIAL/PLATELET
BASO%: 0.4 % (ref 0.0–2.0)
Basophils Absolute: 0 10*3/uL (ref 0.0–0.1)
EOS%: 5.6 % (ref 0.0–7.0)
HCT: 36.2 % (ref 34.8–46.6)
LYMPH%: 34.1 % (ref 14.0–49.7)
MCH: 33.7 pg (ref 25.1–34.0)
MCHC: 34.3 g/dL (ref 31.5–36.0)
MONO#: 0.7 10*3/uL (ref 0.1–0.9)
NEUT%: 34.4 % — ABNORMAL LOW (ref 38.4–76.8)
Platelets: 310 10*3/uL (ref 145–400)

## 2013-01-23 NOTE — Telephone Encounter (Signed)
Gave pt appt for lab chemo for August, MD and labs for September , emailed [place] regarding chemo

## 2013-01-23 NOTE — Telephone Encounter (Signed)
Per staff message and POF I have scheduled appts.  JMW  

## 2013-01-23 NOTE — Progress Notes (Signed)
[facility] Telephone:(336) [phone]   Fax:([phone]  OFFICE PROGRESS NOTE  Pcp Not In System No address on file  DIAGNOSIS: Stage IIIa non-small cell lung cancer, adenocarcinoma with negative EGFR mutation and negative ALK gene translocation diagnosed in March of 2014   PRIOR THERAPY: Concurrent chemoradiation with weekly carboplatin for AUC of 2 and paclitaxel 45 mg/M2, status post 8 cycles, last dose was given on 11/13/2012 with partial response.   CURRENT THERAPY: Consolidation chemotherapy with carboplatin for AUC of 5 on day 1 and gemcitabine 1000 mg/M2 on days 1 and 8 every 3 weeks, status post 1 cycle. This cycle was given on 01/02/2013.  CHEMOTHERAPY INTENT: Control  CURRENT # OF CHEMOTHERAPY CYCLES: 2  CURRENT ANTIEMETICS: Zofran, dexamethasone and Compazine  CURRENT SMOKING STATUS: Former smoker, quit in 1990  ORAL CHEMOTHERAPY AND CONSENT: None  CURRENT BISPHOSPHONATES USE: None  PAIN MANAGEMENT: 0/10  NARCOTICS INDUCED CONSTIPATION: None  LIVING WILL AND CODE STATUS: Full code   INTERVAL HISTORY: Linda Donovan 57 y.o. female returns to the clinic today for followup visit accompanied by her husband. The patient is feeling fine today with no specific complaints except for fatigue. She denied having any significant chest pain, shortness breath, cough or hemoptysis. The patient denied having any alopecia her peripheral neuropathy. She tolerated the first cycle of her treatment fairly well with no significant adverse effects except for the fatigue secondary to chemotherapy-induced anemia. She was supposed to start cycle #2 of her chemotherapy today.  MEDICAL HISTORY: Past Medical History  Diagnosis Date  . COPD (chronic obstructive pulmonary disease)   . ADD (attention deficit disorder)   . Non-small cell lung cancer 09/18/2012    RUL  . Hx of radiation therapy 09/28/12- 11/17/12    RU lung mass, mediastinum chest, 63 gray 35 fx     ALLERGIES:  is allergic to clinoril and morphine and related.  MEDICATIONS:  Current Outpatient Prescriptions  Medication Sig Dispense Refill  . albuterol (PROVENTIL HFA;VENTOLIN HFA) 108 (90 BASE) MCG/ACT inhaler Inhale 2 puffs into the lungs every 6 (six) hours as needed for wheezing.      Marland Kitchen amphetamine-dextroamphetamine (ADDERALL) 20 MG tablet Take 20 mg by mouth daily before breakfast.       . Ascorbic Acid (VITAMIN C) 1000 MG tablet Take 1,000 mg by mouth daily. Taking emergen-c powder      . aspirin 81 MG tablet Take 81 mg by mouth daily.      . calcium-vitamin D (OSCAL WITH D) 250-125 MG-UNIT per tablet Take 1 tablet by mouth daily.      Marland Kitchen loratadine (CLARITIN) 10 MG tablet Take 10 mg by mouth daily as needed.       . mometasone (NASONEX) 50 MCG/ACT nasal spray Place 2 sprays into the nose daily as needed.       . Multiple Vitamins-Minerals (MULTIVITAMIN WITH MINERALS) tablet Take 1 tablet by mouth daily.      . traZODone (DESYREL) 50 MG tablet Take 50 mg by mouth at bedtime.      Marland Kitchen zinc gluconate 50 MG tablet Take 50 mg by mouth daily.      Marland Kitchen HYDROcodone-acetaminophen (HYCET) 7.5-325 mg/15 ml solution Take 15 mLs by mouth every 4 (four) hours as needed for pain.  120 mL  0  . prochlorperazine (COMPAZINE) 10 MG tablet Take 1 tablet (10 mg total) by mouth every 6 (six) hours as needed.  60 tablet  0  . sucralfate (  CARAFATE) 1 GM/10ML suspension Take 10 mLs (1 g total) by mouth 4 (four) times daily.  420 mL  2   No current facility-administered medications for this visit.    SURGICAL HISTORY:  Past Surgical History  Procedure Laterality Date  . Total abdominal hysterectomy    . Shoulder arthroscopy    . Video bronchoscopy Bilateral 07/25/2012    Procedure: VIDEO BRONCHOSCOPY WITH FLUORO;  Surgeon: Nyoka Cowden, MD;  Location: [facility];  Service: Endoscopy;  Laterality: Bilateral;  . Lump removed from breasr right  2003  . Endobronchial ultrasound Bilateral 09/04/2012     Procedure: ENDOBRONCHIAL ULTRASOUND;  Surgeon: Leslye Peer, MD;  Location: [facility];  Service: Cardiopulmonary;  Laterality: Bilateral;    REVIEW OF SYSTEMS:  A comprehensive review of systems was negative except for: Constitutional: positive for fatigue   PHYSICAL EXAMINATION: General appearance: alert, cooperative, fatigued and no distress Head: Normocephalic, without obvious abnormality, atraumatic Neck: no adenopathy Lymph nodes: Cervical, supraclavicular, and axillary nodes normal. Resp: clear to auscultation bilaterally Cardio: regular rate and rhythm, S1, S2 normal, no murmur, click, rub or gallop GI: soft, non-tender; bowel sounds normal; no masses,  no organomegaly Extremities: extremities normal, atraumatic, no cyanosis or edema  ECOG PERFORMANCE STATUS: 1 - Symptomatic but completely ambulatory  Blood pressure 130/83, pulse 113, temperature 97 F (36.1 C), temperature source Oral, resp. rate 20, height 5\' 3"  (1.6 m), weight 136 lb (61.689 kg).  LABORATORY DATA: Lab Results  Component Value Date   WBC 2.7* 01/23/2013   HGB 12.4 01/23/2013   HCT 36.2 01/23/2013   MCV 98.4 01/23/2013   PLT 310 01/23/2013      Chemistry      Component Value Date/Time   NA 142 01/16/2013 0812   NA 138 09/04/2012 0815   K 3.9 01/16/2013 0812   K 3.4* 09/04/2012 0815   CL 104 11/13/2012 1122   CL 100 09/04/2012 0815   CO2 26 01/16/2013 0812   CO2 28 09/04/2012 0815   BUN 10.1 01/16/2013 0812   BUN 6 09/04/2012 0815   CREATININE 0.7 01/16/2013 0812   CREATININE 0.65 09/04/2012 0815      Component Value Date/Time   CALCIUM 9.1 01/16/2013 0812   CALCIUM 9.3 09/04/2012 0815   ALKPHOS 109 01/16/2013 0812   ALKPHOS 90 09/04/2012 0815   AST 32 01/16/2013 0812   AST 22 09/04/2012 0815   ALT 36 01/16/2013 0812   ALT 16 09/04/2012 0815   BILITOT 0.70 01/16/2013 0812   BILITOT 0.6 09/04/2012 0815       RADIOGRAPHIC STUDIES: No results found.  ASSESSMENT AND PLAN: This is a very pleasant 57 years  old white female with history of stage IIIa non-small cell lung cancer status post concurrent chemoradiation with weekly carboplatin and paclitaxel and currently on constipation chemotherapy with carboplatin and gemcitabine status post 1 cycle.  He tolerated the first cycle of her treatment fairly well except for fatigue from the chemotherapy-induced anemia. Her absolute neutrophil count is low today. I recommended for the patient to delay the start of cycle #2 until next week to give her more time for recovery. She would come back for followup visit in 4 weeks with the start of cycle #3. She was advised to call immediately if she has any concerning symptoms in the interval. The patient voices understanding of current disease status and treatment options and is in agreement with the current care plan.  All questions were answered. The patient knows to  call the clinic with any problems, questions or concerns. We can certainly see the patient much sooner if necessary.

## 2013-01-23 NOTE — Patient Instructions (Signed)
CURRENT THERAPY: Consolidation chemotherapy with carboplatin for AUC of 5 on day 1 and gemcitabine 1000 mg/M2 on days 1 and 8 every 3 weeks, status post 1 cycle. This cycle was given on 01/02/2013.  CHEMOTHERAPY INTENT: Control  CURRENT # OF CHEMOTHERAPY CYCLES: 2  CURRENT ANTIEMETICS: Zofran, dexamethasone and Compazine  CURRENT SMOKING STATUS: Former smoker, quit in 1990  ORAL CHEMOTHERAPY AND CONSENT: None  CURRENT BISPHOSPHONATES USE: None  PAIN MANAGEMENT: 0/10  NARCOTICS INDUCED CONSTIPATION: None  LIVING WILL AND CODE STATUS: Full code

## 2013-01-24 ENCOUNTER — Telehealth: Payer: Self-pay | Admitting: Internal Medicine

## 2013-01-24 NOTE — Telephone Encounter (Signed)
Talked to tp ans she is aware of all appt for August and September 2014

## 2013-01-30 ENCOUNTER — Other Ambulatory Visit: Payer: BC Managed Care – PPO | Admitting: Lab

## 2013-01-30 ENCOUNTER — Ambulatory Visit (HOSPITAL_BASED_OUTPATIENT_CLINIC_OR_DEPARTMENT_OTHER): Payer: BC Managed Care – PPO

## 2013-01-30 VITALS — BP 134/88 | HR 108 | Temp 97.7°F

## 2013-01-30 DIAGNOSIS — Z5111 Encounter for antineoplastic chemotherapy: Secondary | ICD-10-CM

## 2013-01-30 DIAGNOSIS — C3491 Malignant neoplasm of unspecified part of right bronchus or lung: Secondary | ICD-10-CM

## 2013-01-30 DIAGNOSIS — C3410 Malignant neoplasm of upper lobe, unspecified bronchus or lung: Secondary | ICD-10-CM

## 2013-01-30 DIAGNOSIS — C3490 Malignant neoplasm of unspecified part of unspecified bronchus or lung: Secondary | ICD-10-CM

## 2013-01-30 LAB — CBC WITH DIFFERENTIAL/PLATELET
Basophils Absolute: 0 10*3/uL (ref 0.0–0.1)
EOS%: 2.7 % (ref 0.0–7.0)
HGB: 14 g/dL (ref 11.6–15.9)
LYMPH%: 27.5 % (ref 14.0–49.7)
MCH: 33.7 pg (ref 25.1–34.0)
MCV: 100.5 fL (ref 79.5–101.0)
MONO%: 23.8 % — ABNORMAL HIGH (ref 0.0–14.0)
Platelets: 435 10*3/uL — ABNORMAL HIGH (ref 145–400)
RDW: 14.8 % — ABNORMAL HIGH (ref 11.2–14.5)

## 2013-01-30 LAB — COMPREHENSIVE METABOLIC PANEL (CC13)
Alkaline Phosphatase: 99 U/L (ref 40–150)
BUN: 8.1 mg/dL (ref 7.0–26.0)
CO2: 25 mEq/L (ref 22–29)
Creatinine: 0.6 mg/dL (ref 0.6–1.1)
Glucose: 112 mg/dl (ref 70–140)
Sodium: 143 mEq/L (ref 136–145)
Total Bilirubin: 0.28 mg/dL (ref 0.20–1.20)
Total Protein: 7.3 g/dL (ref 6.4–8.3)

## 2013-01-30 MED ORDER — SODIUM CHLORIDE 0.9 % IV SOLN
800.0000 mg/m2 | Freq: Once | INTRAVENOUS | Status: AC
  Administered 2013-01-30: 1292 mg via INTRAVENOUS
  Filled 2013-01-30: qty 33.98

## 2013-01-30 MED ORDER — DEXAMETHASONE SODIUM PHOSPHATE 20 MG/5ML IJ SOLN
20.0000 mg | Freq: Once | INTRAMUSCULAR | Status: AC
  Administered 2013-01-30: 20 mg via INTRAVENOUS

## 2013-01-30 MED ORDER — SODIUM CHLORIDE 0.9 % IV SOLN
430.0000 mg | Freq: Once | INTRAVENOUS | Status: AC
  Administered 2013-01-30: 430 mg via INTRAVENOUS
  Filled 2013-01-30: qty 43

## 2013-01-30 MED ORDER — ONDANSETRON 16 MG/50ML IVPB (CHCC)
16.0000 mg | Freq: Once | INTRAVENOUS | Status: AC
  Administered 2013-01-30: 16 mg via INTRAVENOUS

## 2013-01-30 MED ORDER — SODIUM CHLORIDE 0.9 % IV SOLN
Freq: Once | INTRAVENOUS | Status: AC
  Administered 2013-01-30: 09:00:00 via INTRAVENOUS

## 2013-01-30 NOTE — Patient Instructions (Signed)
[facility] Discharge Instructions for Patients Receiving Chemotherapy  Today you received the following chemotherapy agents Gemzar/Carboplatin.  To help prevent nausea and vomiting after your treatment, we encourage you to take your nausea medication as prescribed.   If you develop nausea and vomiting that is not controlled by your nausea medication, call the clinic.   BELOW ARE SYMPTOMS THAT SHOULD BE REPORTED IMMEDIATELY:  *FEVER GREATER THAN 100.5 F  *CHILLS WITH OR WITHOUT FEVER  NAUSEA AND VOMITING THAT IS NOT CONTROLLED WITH YOUR NAUSEA MEDICATION  *UNUSUAL SHORTNESS OF BREATH  *UNUSUAL BRUISING OR BLEEDING  TENDERNESS IN MOUTH AND THROAT WITH OR WITHOUT PRESENCE OF ULCERS  *URINARY PROBLEMS  *BOWEL PROBLEMS  UNUSUAL RASH Items with * indicate a potential emergency and should be followed up as soon as possible.  Feel free to call the clinic you have any questions or concerns. The clinic phone number is [phone].    

## 2013-01-30 NOTE — Progress Notes (Signed)
Ok to treat with ANC 1.4 and WBC 3.0 per Dr. Arbutus Ped.

## 2013-02-07 ENCOUNTER — Ambulatory Visit (HOSPITAL_BASED_OUTPATIENT_CLINIC_OR_DEPARTMENT_OTHER): Payer: BC Managed Care – PPO

## 2013-02-07 ENCOUNTER — Other Ambulatory Visit (HOSPITAL_BASED_OUTPATIENT_CLINIC_OR_DEPARTMENT_OTHER): Payer: BC Managed Care – PPO | Admitting: Lab

## 2013-02-07 VITALS — BP 162/95 | HR 121 | Temp 97.6°F | Resp 20

## 2013-02-07 DIAGNOSIS — C3491 Malignant neoplasm of unspecified part of right bronchus or lung: Secondary | ICD-10-CM

## 2013-02-07 DIAGNOSIS — C3490 Malignant neoplasm of unspecified part of unspecified bronchus or lung: Secondary | ICD-10-CM

## 2013-02-07 DIAGNOSIS — Z5111 Encounter for antineoplastic chemotherapy: Secondary | ICD-10-CM

## 2013-02-07 DIAGNOSIS — C3410 Malignant neoplasm of upper lobe, unspecified bronchus or lung: Secondary | ICD-10-CM

## 2013-02-07 LAB — CBC WITH DIFFERENTIAL/PLATELET
Basophils Absolute: 0 10*3/uL (ref 0.0–0.1)
Eosinophils Absolute: 0 10*3/uL (ref 0.0–0.5)
HCT: 40.9 % (ref 34.8–46.6)
HGB: 14.2 g/dL (ref 11.6–15.9)
LYMPH%: 17 % (ref 14.0–49.7)
MCV: 97.4 fL (ref 79.5–101.0)
MONO#: 0.5 10*3/uL (ref 0.1–0.9)
MONO%: 11.3 % (ref 0.0–14.0)
NEUT#: 3.2 10*3/uL (ref 1.5–6.5)
NEUT%: 70.3 % (ref 38.4–76.8)
Platelets: 178 10*3/uL (ref 145–400)
WBC: 4.5 10*3/uL (ref 3.9–10.3)

## 2013-02-07 LAB — COMPREHENSIVE METABOLIC PANEL (CC13)
ALT: 53 U/L (ref 0–55)
CO2: 23 mEq/L (ref 22–29)
Calcium: 9.6 mg/dL (ref 8.4–10.4)
Chloride: 105 mEq/L (ref 98–109)
Creatinine: 0.6 mg/dL (ref 0.6–1.1)
Glucose: 100 mg/dl (ref 70–140)
Total Bilirubin: 0.27 mg/dL (ref 0.20–1.20)

## 2013-02-07 MED ORDER — PROCHLORPERAZINE MALEATE 10 MG PO TABS
10.0000 mg | ORAL_TABLET | Freq: Once | ORAL | Status: AC
  Administered 2013-02-07: 10 mg via ORAL

## 2013-02-07 MED ORDER — SODIUM CHLORIDE 0.9 % IV SOLN
800.0000 mg/m2 | Freq: Once | INTRAVENOUS | Status: AC
  Administered 2013-02-07: 1292 mg via INTRAVENOUS
  Filled 2013-02-07: qty 33.98

## 2013-02-07 MED ORDER — SODIUM CHLORIDE 0.9 % IV SOLN
Freq: Once | INTRAVENOUS | Status: AC
  Administered 2013-02-07: 09:00:00 via INTRAVENOUS

## 2013-02-07 NOTE — Progress Notes (Signed)
Dr Arbutus Ped notified of patient's chest discomfort, he feels this related to radiation therapy. Encouraged patient to use pain meds at home as needed. Pt states she does not need anything to take now during infusion. Will take this evening.

## 2013-02-07 NOTE — Patient Instructions (Signed)
[facility] Discharge Instructions for Patients Receiving Chemotherapy  Today you received the following chemotherapy agents Gemzar  To help prevent nausea and vomiting after your treatment, we encourage you to take your nausea medication as needed   If you develop nausea and vomiting that is not controlled by your nausea medication, call the clinic.   BELOW ARE SYMPTOMS THAT SHOULD BE REPORTED IMMEDIATELY:  *FEVER GREATER THAN 100.5 F  *CHILLS WITH OR WITHOUT FEVER  NAUSEA AND VOMITING THAT IS NOT CONTROLLED WITH YOUR NAUSEA MEDICATION  *UNUSUAL SHORTNESS OF BREATH  *UNUSUAL BRUISING OR BLEEDING  TENDERNESS IN MOUTH AND THROAT WITH OR WITHOUT PRESENCE OF ULCERS  *URINARY PROBLEMS  *BOWEL PROBLEMS  UNUSUAL RASH Items with * indicate a potential emergency and should be followed up as soon as possible.  Feel free to call the clinic you have any questions or concerns. The clinic phone number is [phone].    

## 2013-02-08 ENCOUNTER — Encounter: Payer: Self-pay | Admitting: Radiation Oncology

## 2013-02-08 ENCOUNTER — Telehealth: Payer: Self-pay | Admitting: *Deleted

## 2013-02-08 NOTE — Telephone Encounter (Signed)
Patient called stating she is having right upper chest discomfort, past 4 days, [phone],  Having a dry cough, feels like she has pulled a muscle, she told this she had chemotherapy infusion yesterday and per med onc nurse told her could bwe from coughing or per Med Onc Md thought could be from s/p radiation, and to take pain meds, "she wants to speak with Dr.kinard nurse, or Md , informed her MD in a procedure at present and his nurse has gone home, but will forward this to MD and will have either Clydie Braun Hess,RN or Sonda Rumble Rn, call her back after meeeting with Dr.Kinard, no fever only ghas a dry cough no nausea 4:22 PM

## 2013-02-08 NOTE — Progress Notes (Signed)
   Department of Radiation Oncology  Phone:  [phone] Fax:        [phone]  Progress note  This evening I spoke with the patient. She's been having pain along the right upper chest region. She also notices increased coughing associated with her chemotherapy administration. She may be having some increasing shortness of breath but denies any fever. I discussed with patient that she may have radiation pneumonitis and consider a short course of steroids. Patient would like to try steroids over the weekend. She is unsure was causing her pain in the chest and I will also schedule her for followup on Monday, September 8. She does admit that this pain will awaken her at night.  -----------------------------------  Billie Lade, PhD, MD

## 2013-02-08 NOTE — Telephone Encounter (Signed)
Spoke with Dr. Roselind Messier at [phone] and he stated call Linda Donovan in regards to her chest tightness  X 4 days.

## 2013-02-09 ENCOUNTER — Telehealth: Payer: Self-pay | Admitting: *Deleted

## 2013-02-09 NOTE — Telephone Encounter (Signed)
Per Dr Roselind Messier, called [facility] [address] and left vm for Medrol dose pack for pt.

## 2013-02-09 NOTE — Telephone Encounter (Signed)
Called patient informed her rx called to [facility]

## 2013-02-12 ENCOUNTER — Encounter: Payer: Self-pay | Admitting: Radiation Oncology

## 2013-02-12 ENCOUNTER — Ambulatory Visit
Admission: RE | Admit: 2013-02-12 | Discharge: 2013-02-12 | Disposition: A | Discharge status: Home / Self Care | Payer: BC Managed Care – PPO | Source: Ambulatory Visit | Attending: Radiation Oncology | Admitting: Radiation Oncology

## 2013-02-12 VITALS — BP 153/83 | HR 112 | Temp 97.8°F | Ht 63.0 in | Wt 133.4 lb

## 2013-02-12 DIAGNOSIS — C3491 Malignant neoplasm of unspecified part of right bronchus or lung: Secondary | ICD-10-CM

## 2013-02-12 MED ORDER — HYDROCODONE-ACETAMINOPHEN 5-325 MG PO TABS: 1.0000 | ORAL_TABLET | Freq: Four times a day (QID) | ORAL | Status: DC | PRN

## 2013-02-12 NOTE — Progress Notes (Signed)
Linda Donovan here with her mother for follow up after treatment to her right lung.  She is having pain in her right chest, right shoulder and right shoulder blade that she is rating at a 8/10.   She also reports that her right chest is swollen She said it started hurting a week ago.  She started taking the medrol dose pack and said that it has taken the edge of the pain.  She reports that she has a dry cough and has shortness of breath with activity.  She does have fatigue.

## 2013-02-12 NOTE — Progress Notes (Signed)
Radiation Oncology         [phone] ________________________________  Name: Linda Donovan MRN: [id number]  Date: 02/12/2013  DOB: 02/18/1956  Follow-Up Visit Note  CC: Pcp Not In System  Si Gaul, MD  Diagnosis:   Stage III non-small cell lung cancer  Interval Since Last Radiation:  3  months  Narrative:    The patient comes in today for further evaluation. She called late last week was having some pain along the right chest and shoulder area as well as scapula. She is also having some mild coughing and some very mild shortness of breath over her baseline. Patient was placed on a Medrol Dosepak which is helped her symptoms slightly. She also notices some swelling in the skin of the right anterior chest region.                             ALLERGIES:  is allergic to clinoril and morphine and related.  Meds: Current Outpatient Prescriptions  Medication Sig Dispense Refill  . albuterol (PROVENTIL HFA;VENTOLIN HFA) 108 (90 BASE) MCG/ACT inhaler Inhale 2 puffs into the lungs every 6 (six) hours as needed for wheezing.      Marland Kitchen amphetamine-dextroamphetamine (ADDERALL) 20 MG tablet Take 20 mg by mouth daily before breakfast.       . Ascorbic Acid (VITAMIN C) 1000 MG tablet Take 1,000 mg by mouth daily. Taking emergen-c powder      . calcium-vitamin D (OSCAL WITH D) 250-125 MG-UNIT per tablet Take 1 tablet by mouth daily.      . MethylPREDNISolone (MEDROL, PAK, PO) Take by mouth.      . mometasone (NASONEX) 50 MCG/ACT nasal spray Place 2 sprays into the nose daily as needed.       . Multiple Vitamins-Minerals (MULTIVITAMIN WITH MINERALS) tablet Take 1 tablet by mouth daily.      . traZODone (DESYREL) 50 MG tablet Take 50 mg by mouth at bedtime.      Marland Kitchen zinc gluconate 50 MG tablet Take 50 mg by mouth daily.      Marland Kitchen aspirin 81 MG tablet Take 81 mg by mouth daily.      Marland Kitchen HYDROcodone-acetaminophen (HYCET) 7.5-325 mg/15 ml solution Take 15 mLs by mouth every 4 (four) hours as needed for  pain.  120 mL  0  . HYDROcodone-acetaminophen (NORCO/VICODIN) 5-325 MG per tablet Take 1 tablet by mouth every 6 (six) hours as needed for pain.  30 tablet  0  . loratadine (CLARITIN) 10 MG tablet Take 10 mg by mouth daily as needed.       . prochlorperazine (COMPAZINE) 10 MG tablet Take 1 tablet (10 mg total) by mouth every 6 (six) hours as needed.  60 tablet  0  . sucralfate (CARAFATE) 1 GM/10ML suspension Take 10 mLs (1 g total) by mouth 4 (four) times daily.  420 mL  2   No current facility-administered medications for this encounter.    Physical Findings: The patient is in no acute distress. Patient is alert and oriented.  height is 5\' 3"  (1.6 m) and weight is 133 lb 6.4 oz (60.51 kg). Her temperature is 97.8 F (36.6 C). Her blood pressure is 153/83 and her pulse is 112. Her oxygen saturation is 92%. .   No palpabel supraclavicular or axillary adenopathy. The lungs are clear to auscultation. The heart has regular rhythm with a slightly increased rate.  I do not appreciate any swelling along  the right anterior chest region. There is no venous jugular distention when lying flat. Patient has tenderness with palpation along the right scapula and right shoulder region .  There no palpable cords within the right upper arm are appreciated or swelling in the right arm.   Lab Findings: Lab Results  Component Value Date   WBC 4.5 02/07/2013   HGB 14.2 02/07/2013   HCT 40.9 02/07/2013   MCV 97.4 02/07/2013   PLT 178 02/07/2013     Radiographic Findings: No results found.  Impression:  chest and shoulder pain of unknown etiology. This is improved slightly with steroids.  Patient has been given a prescription for hydrocodone for her pain.  Plan:  I offered plain x-rays of the shoulder and chest as well as a chest CT scan for further evaluation. At this point patient would just like to be on pain medication to see if this problem will resolve on its own. She will continue with systemic chemotherapy. She  will call back if she wishes to pursue plain x-rays were a chest CT scan for further evaluation.  _____________________________________

## 2013-02-13 ENCOUNTER — Other Ambulatory Visit (HOSPITAL_BASED_OUTPATIENT_CLINIC_OR_DEPARTMENT_OTHER): Payer: BC Managed Care – PPO

## 2013-02-13 DIAGNOSIS — C3491 Malignant neoplasm of unspecified part of right bronchus or lung: Secondary | ICD-10-CM

## 2013-02-13 DIAGNOSIS — C3410 Malignant neoplasm of upper lobe, unspecified bronchus or lung: Secondary | ICD-10-CM

## 2013-02-13 LAB — COMPREHENSIVE METABOLIC PANEL (CC13)
ALT: 50 U/L (ref 0–55)
CO2: 29 mEq/L (ref 22–29)
Calcium: 9.6 mg/dL (ref 8.4–10.4)
Chloride: 102 mEq/L (ref 98–109)
Glucose: 118 mg/dl (ref 70–140)
Sodium: 139 mEq/L (ref 136–145)
Total Protein: 7.4 g/dL (ref 6.4–8.3)

## 2013-02-13 LAB — CBC WITH DIFFERENTIAL/PLATELET
BASO%: 0.3 % (ref 0.0–2.0)
Eosinophils Absolute: 0 10*3/uL (ref 0.0–0.5)
HCT: 38.4 % (ref 34.8–46.6)
MCHC: 34.1 g/dL (ref 31.5–36.0)
MONO#: 0.3 10*3/uL (ref 0.1–0.9)
NEUT#: 4.6 10*3/uL (ref 1.5–6.5)
NEUT%: 78.6 % — ABNORMAL HIGH (ref 38.4–76.8)
RBC: 3.85 10*6/uL (ref 3.70–5.45)
WBC: 5.9 10*3/uL (ref 3.9–10.3)
lymph#: 0.9 10*3/uL (ref 0.9–3.3)

## 2013-02-16 ENCOUNTER — Telehealth: Payer: Self-pay | Admitting: Oncology

## 2013-02-16 ENCOUNTER — Other Ambulatory Visit: Payer: Self-pay | Admitting: Radiation Oncology

## 2013-02-16 MED ORDER — HYDROCODONE-ACETAMINOPHEN 5-325 MG PO TABS: 2.0000 | ORAL_TABLET | Freq: Four times a day (QID) | ORAL | Status: DC | PRN

## 2013-02-16 NOTE — Telephone Encounter (Addendum)
Received a call from Linda Donovan, patient of Dr. Roselind Messier.  She said the pain medication (HYDROcodone-acetaminophen (NORCO/VICODIN) 5-325 MG per tablet) she was given on 02/12/2013 is not working.  She has pain in her right shoulder and right upper back/scapula.  She is rating her pain at a 6-7/10 now.  She is wondering if there is anything else that she can take to relieve the pain.  She is allergic to clinoril and morphine.

## 2013-02-16 NOTE — Telephone Encounter (Signed)
She can try taking 2 tablets and see how that works.  It also looks like she had Hycet in her active medications when she saw Dr. Roselind Messier on 9/8.  If she has some of that, that is stronger and she can try taking that.   SW

## 2013-02-16 NOTE — Telephone Encounter (Signed)
Called and let Linda Donovan know that a refill for her hydrocodone/acetaminophen 5/325 mg (take 2 tablets by mouth every 6 hours as needed for pain. Qty 60. 0 refills) has been called in to the [facility] at [address].  Also advised her to let us know if the pain is releived and if not she will probably need an xray per Dr. Michell Heinrich.

## 2013-02-19 ENCOUNTER — Other Ambulatory Visit: Payer: Self-pay | Admitting: Radiation Oncology

## 2013-02-19 ENCOUNTER — Telehealth: Payer: Self-pay | Admitting: Oncology

## 2013-02-19 ENCOUNTER — Ambulatory Visit
Admission: RE | Admit: 2013-02-19 | Discharge: 2013-02-19 | Disposition: A | Discharge status: Home / Self Care | Payer: BC Managed Care – PPO | Source: Ambulatory Visit | Attending: Radiation Oncology | Admitting: Radiation Oncology

## 2013-02-19 ENCOUNTER — Telehealth: Payer: Self-pay | Admitting: Medical Oncology

## 2013-02-19 VITALS — BP 124/84 | HR 121 | Temp 97.8°F | Ht 63.0 in | Wt 134.9 lb

## 2013-02-19 DIAGNOSIS — C3491 Malignant neoplasm of unspecified part of right bronchus or lung: Secondary | ICD-10-CM

## 2013-02-19 MED ORDER — OXYCODONE-ACETAMINOPHEN 5-325 MG PO TABS: 2.0000 | ORAL_TABLET | ORAL | Status: DC | PRN

## 2013-02-19 NOTE — Progress Notes (Signed)
Linda Donovan here with her husband for follow up after treatment to her right upper lung mass and mediastinum.  She is rating her pain at a 10/10 in her right chest, right shoulder, right upper back and right upper arm.  She states the pain is much worse than it was on Friday.  She has been taking 2 tablets of hydrocodone/acetaminophen every 6 hours.  She says it dulls the pain for about 4 hours and then has to wait to take medication again.  She says that her right upper back feels swollen like there is a ball in it.  She says that the skin on her mid chest is red and swollen also.  She is not able to sleep well and she states that she has cried 3 times because of the pain over the weekend.  She denies nausea and loss of appetite.

## 2013-02-19 NOTE — Telephone Encounter (Signed)
Pe Dr Arbutus Ped I told pt she can take ibuprophen 200 mg no more than 3 times a day.Pt voices understanding.

## 2013-02-19 NOTE — Telephone Encounter (Signed)
"  pain and swelling in chest and back from radiation" Dr Dayton Scrape prescribed percocet . Pt wants to know if she can take naproxen or ibuprophen. ?

## 2013-02-19 NOTE — Telephone Encounter (Signed)
She can see me today or Dr. Michell Heinrich tomorrow. Sounds like she needs some scans as suggested by Dr. Roselind Messier. RM

## 2013-02-19 NOTE — Progress Notes (Signed)
CC: Dr. Antony Blackbird, Tiana Loft PA   Followup note:  Ms Kasler visits today just over 3 months following completion of chemoradiation in the management of her T3 N2 non-small cell carcinoma the right lung. She seen today after calling this morning stating that her right chest wall pain is poorly controlled on hydrocodone which was recently increased late last week. She is currently back on chemotherapy. Following her treatment she developed a brisk skin reaction along her right upper back which was treated with Silvadene cream. She was recently seen by Dr. Roselind Messier and placed on a steroid Dosepak with slight improvement of her discomfort. However, her pain has worsened since late last week despite increasing her hydrocodone/APAP medication to 2 tablets every 6 hours. She is not having any significant dyspnea or dysphagia. She received her chemotherapy in late July. She has not had any difficulty with constipation.  Physical examination: Alert and oriented. She is in no respiratory distress. Wt Readings from Last 3 Encounters:  02/19/13 134 lb 14.4 oz (61.19 kg)  02/12/13 133 lb 6.4 oz (60.51 kg)  01/23/13 136 lb (61.689 kg)   Temp Readings from Last 3 Encounters:  02/19/13 97.8 F (36.6 C)   02/12/13 97.8 F (36.6 C)   02/07/13 97.6 F (36.4 C) Oral   BP Readings from Last 3 Encounters:  02/19/13 124/84  02/12/13 153/83  02/07/13 162/95   Pulse Readings from Last 3 Encounters:  02/19/13 121  02/12/13 112  02/07/13 121   Nodes: Without palpable cervical or supraclavicular lymphadenopathy. Chest: On inspection of the chest there is residual hyperpigmentation and erythema along the right upper posterior chest wall with some soft tissue swelling. This area is tender to light touch. There is also slight tenderness on palpation along an area of hyperpigmentation along the anterior right chest. Lungs are clear. Abdomen without hepatomegaly.  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 I reviewed her dosimetry and her field distribution and doses looked appropriate. My initial feeling is that she would probably improve with an NSAID, but she was told not to take any NSAIDs during her ongoing chemotherapy. Of note is that her platelet count from September 9 was down to  69K. She'll see Tiana Loft PA for followup later this week.  I wonder if she would be a candidate for NSAIDs, but I doubt this with her most recent platelet count. She was on Medrol Dosepak which helped only slightly. I will not renew her Dosepak. There is probably some delayed healing from her radiation therapy with her ongoing chemotherapy. Will go ahead and get her started on oxycodone/APAP (5/325) to take 1-2 tablets every 4 hours when necessary. She does have a history of itching with IV morphine, but she has not had any difficulty with hydrocodone. If she tolerates immediate release oxycodone without pruritus that we can go ahead and get her started on OxyContin if necessary. I reassured her and her husband that her symptoms should eventually improve.  Plan: Oxycodone/APAP (5/325) to take 1-2 tabs every 4 hours when necessary pain. She is to call if she has any pruritus, , I cautioned her about the possibility of anaphylaxis. When her blood counts allow, she may benefit from an NSAID. Followup visit with Dr. Roselind Messier  next week.

## 2013-02-19 NOTE — Telephone Encounter (Signed)
Linda Donovan called and said that her pain is not any better.  She said her right chest, shoulder and right upper back is hurting.  The pain is also going down her arm.  Her hydrocodone/acetaminophen was increased to 2 tablets every 6 hours on Friday by Dr. Michell Heinrich.  She said it takes the edge off and lasts for about 4 hours 15 minutes and then it is back.  She is wondering what she needs to do.  Will contact Dr. Dayton Scrape, Dr on call, and call her back.

## 2013-02-20 ENCOUNTER — Telehealth: Payer: Self-pay | Admitting: Internal Medicine

## 2013-02-20 ENCOUNTER — Ambulatory Visit (HOSPITAL_BASED_OUTPATIENT_CLINIC_OR_DEPARTMENT_OTHER): Payer: BC Managed Care – PPO | Admitting: Physician Assistant

## 2013-02-20 ENCOUNTER — Other Ambulatory Visit (HOSPITAL_BASED_OUTPATIENT_CLINIC_OR_DEPARTMENT_OTHER): Payer: BC Managed Care – PPO | Admitting: Lab

## 2013-02-20 ENCOUNTER — Encounter: Payer: Self-pay | Admitting: Internal Medicine

## 2013-02-20 ENCOUNTER — Ambulatory Visit (HOSPITAL_BASED_OUTPATIENT_CLINIC_OR_DEPARTMENT_OTHER): Payer: BC Managed Care – PPO

## 2013-02-20 VITALS — BP 121/79 | HR 112 | Temp 97.6°F | Resp 18 | Ht 63.0 in | Wt 134.9 lb

## 2013-02-20 DIAGNOSIS — C3410 Malignant neoplasm of upper lobe, unspecified bronchus or lung: Secondary | ICD-10-CM

## 2013-02-20 DIAGNOSIS — C3490 Malignant neoplasm of unspecified part of unspecified bronchus or lung: Secondary | ICD-10-CM

## 2013-02-20 DIAGNOSIS — M25519 Pain in unspecified shoulder: Secondary | ICD-10-CM

## 2013-02-20 DIAGNOSIS — R5381 Other malaise: Secondary | ICD-10-CM

## 2013-02-20 DIAGNOSIS — D6481 Anemia due to antineoplastic chemotherapy: Secondary | ICD-10-CM

## 2013-02-20 DIAGNOSIS — Z5111 Encounter for antineoplastic chemotherapy: Secondary | ICD-10-CM

## 2013-02-20 DIAGNOSIS — C3491 Malignant neoplasm of unspecified part of right bronchus or lung: Secondary | ICD-10-CM

## 2013-02-20 LAB — CBC WITH DIFFERENTIAL/PLATELET
BASO%: 0.3 % (ref 0.0–2.0)
MCHC: 34.6 g/dL (ref 31.5–36.0)
MONO#: 1.5 10*3/uL — ABNORMAL HIGH (ref 0.1–0.9)
RBC: 3.85 10*6/uL (ref 3.70–5.45)
RDW: 15.1 % — ABNORMAL HIGH (ref 11.2–14.5)
WBC: 9.8 10*3/uL (ref 3.9–10.3)
lymph#: 1.1 10*3/uL (ref 0.9–3.3)

## 2013-02-20 LAB — COMPREHENSIVE METABOLIC PANEL (CC13)
AST: 54 U/L — ABNORMAL HIGH (ref 5–34)
BUN: 8.7 mg/dL (ref 7.0–26.0)
Calcium: 9.3 mg/dL (ref 8.4–10.4)
Chloride: 102 mEq/L (ref 98–109)
Creatinine: 0.7 mg/dL (ref 0.6–1.1)

## 2013-02-20 MED ORDER — DEXAMETHASONE SODIUM PHOSPHATE 20 MG/5ML IJ SOLN
INTRAMUSCULAR | Status: AC
  Filled 2013-02-20: qty 5

## 2013-02-20 MED ORDER — SODIUM CHLORIDE 0.9 % IV SOLN: 800.0000 mg/m2 | Freq: Once | INTRAVENOUS | Status: DC

## 2013-02-20 MED ORDER — DEXAMETHASONE SODIUM PHOSPHATE 20 MG/5ML IJ SOLN
20.0000 mg | Freq: Once | INTRAMUSCULAR | Status: AC
  Administered 2013-02-20: 20 mg via INTRAVENOUS

## 2013-02-20 MED ORDER — SODIUM CHLORIDE 0.9 % IV SOLN
430.0000 mg | Freq: Once | INTRAVENOUS | Status: AC
  Administered 2013-02-20: 430 mg via INTRAVENOUS
  Filled 2013-02-20: qty 43

## 2013-02-20 MED ORDER — ONDANSETRON 16 MG/50ML IVPB (CHCC)
INTRAVENOUS | Status: AC
  Filled 2013-02-20: qty 16

## 2013-02-20 MED ORDER — SODIUM CHLORIDE 0.9 % IV SOLN
800.0000 mg/m2 | Freq: Once | INTRAVENOUS | Status: AC
  Administered 2013-02-20: 1330 mg via INTRAVENOUS
  Filled 2013-02-20: qty 34.98

## 2013-02-20 MED ORDER — ONDANSETRON 16 MG/50ML IVPB (CHCC)
16.0000 mg | Freq: Once | INTRAVENOUS | Status: AC
  Administered 2013-02-20: 16 mg via INTRAVENOUS

## 2013-02-20 MED ORDER — SODIUM CHLORIDE 0.9 % IV SOLN: 2000.0000 mg/m2 | Freq: Once | INTRAVENOUS | Status: DC

## 2013-02-20 MED ORDER — SODIUM CHLORIDE 0.9 % IV SOLN
Freq: Once | INTRAVENOUS | Status: AC
  Administered 2013-02-20: 10:00:00 via INTRAVENOUS

## 2013-02-20 NOTE — Patient Instructions (Addendum)
In addition to your current pain medication you may take ibuprofen 200 mg, one tablet by mouth no more than every 8 hours. Remember to take the ibuprofen with food Continue with your weekly labs as scheduled Followup with Dr. Arbutus Ped in 3 weeks with a restaging CT scan of your chest to reevaluate your disease

## 2013-02-20 NOTE — Patient Instructions (Signed)
[facility] Discharge Instructions for Patients Receiving Chemotherapy  Today you received the following chemotherapy agents Gemzar/Carbpolatin To help prevent nausea and vomiting after your treatment, we encourage you to take your nausea medication as prescribed. If you develop nausea and vomiting that is not controlled by your nausea medication, call the clinic.   BELOW ARE SYMPTOMS THAT SHOULD BE REPORTED IMMEDIATELY:  *FEVER GREATER THAN 100.5 F  *CHILLS WITH OR WITHOUT FEVER  NAUSEA AND VOMITING THAT IS NOT CONTROLLED WITH YOUR NAUSEA MEDICATION  *UNUSUAL SHORTNESS OF BREATH  *UNUSUAL BRUISING OR BLEEDING  TENDERNESS IN MOUTH AND THROAT WITH OR WITHOUT PRESENCE OF ULCERS  *URINARY PROBLEMS  *BOWEL PROBLEMS  UNUSUAL RASH Items with * indicate a potential emergency and should be followed up as soon as possible.  Feel free to call the clinic you have any questions or concerns. The clinic phone number is [phone].

## 2013-02-20 NOTE — Progress Notes (Addendum)
[name] [phone]   [phone]  OFFICE PROGRESS NOTE  Pcp Not In System No address on file  DIAGNOSIS: Stage IIIa non-small cell lung cancer, adenocarcinoma with negative EGFR mutation and negative ALK gene translocation diagnosed in March of 2014   PRIOR THERAPY: Concurrent chemoradiation with weekly carboplatin for AUC of 2 and paclitaxel 45 mg/M2, status post 8 cycles, last dose was given on 11/13/2012 with partial response.   CURRENT THERAPY: Consolidation chemotherapy with carboplatin for AUC of 5 on day 1 and gemcitabine 1000 mg/M2 on days 1 and 8 every 3 weeks, status post 2 cycles. First cycle was given on 01/02/2013.  CHEMOTHERAPY INTENT: Control  CURRENT # OF CHEMOTHERAPY CYCLES: 3  CURRENT ANTIEMETICS: Zofran, dexamethasone and Compazine  CURRENT SMOKING STATUS: Former smoker, quit in 1990  ORAL CHEMOTHERAPY AND CONSENT: None  CURRENT BISPHOSPHONATES USE: None  PAIN MANAGEMENT: 8/10, currently Percocet 5/325, 1-2 tabs q 4 hours  NARCOTICS INDUCED CONSTIPATION: None  LIVING WILL AND CODE STATUS: Full code   INTERVAL HISTORY: Linda Donovan 57 y.o. female returns to the clinic today for followup visit accompanied by a family member. The patient complains of continued right shoulder/side pain. She was seen by Dr. Dayton Scrape yesterday and had her pain medication change from Vicodin to Percocet with the instructions for her to take 1-2 tablets by mouth every 4 hours. His further recommendation was perhaps at nonsteroidal anti-inflammatory agent however he was concerned about her platelet count and left this decision up to Dr. Arbutus Ped. She is otherwise feeling well and presents to proceed with cycle #3 of her consolidation chemotherapy with carboplatin and gemcitabine.  She denied having any significant chest pain, shortness breath, cough or hemoptysis. The patient denied having any alopecia her peripheral neuropathy.   MEDICAL  HISTORY: Past Medical History  Diagnosis Date  . COPD (chronic obstructive pulmonary disease)   . ADD (attention deficit disorder)   . Non-small cell lung cancer 09/18/2012    RUL  . Hx of radiation therapy 09/28/12- 11/17/12    RU lung mass, mediastinum chest, 63 gray 35 fx    ALLERGIES:  is allergic to clinoril and morphine and related.  MEDICATIONS:  Current Outpatient Prescriptions  Medication Sig Dispense Refill  . albuterol (PROVENTIL HFA;VENTOLIN HFA) 108 (90 BASE) MCG/ACT inhaler Inhale 2 puffs into the lungs every 6 (six) hours as needed for wheezing.      Marland Kitchen amphetamine-dextroamphetamine (ADDERALL) 20 MG tablet Take 20 mg by mouth daily before breakfast.       . Ascorbic Acid (VITAMIN C) 1000 MG tablet Take 1,000 mg by mouth daily. Taking emergen-c powder      . calcium-vitamin D (OSCAL WITH D) 250-125 MG-UNIT per tablet Take 1 tablet by mouth daily.      Marland Kitchen HYDROcodone-acetaminophen (NORCO/VICODIN) 5-325 MG per tablet Take 2 tablets by mouth every 6 (six) hours as needed for pain.  60 tablet  0  . loratadine (CLARITIN) 10 MG tablet Take 10 mg by mouth daily as needed.       . MethylPREDNISolone (MEDROL, PAK, PO) Take by mouth.      . mometasone (NASONEX) 50 MCG/ACT nasal spray Place 2 sprays into the nose daily as needed.       . Multiple Vitamins-Minerals (MULTIVITAMIN WITH MINERALS) tablet Take 1 tablet by mouth daily.      Marland Kitchen oxyCODONE-acetaminophen (PERCOCET/ROXICET) 5-325 MG per tablet Take 2 tablets by mouth every 4 (four) hours as needed  for pain.  60 tablet  0  . prochlorperazine (COMPAZINE) 10 MG tablet Take 1 tablet (10 mg total) by mouth every 6 (six) hours as needed.  60 tablet  0  . sucralfate (CARAFATE) 1 GM/10ML suspension Take 10 mLs (1 g total) by mouth 4 (four) times daily.  420 mL  2  . traZODone (DESYREL) 50 MG tablet Take 50 mg by mouth at bedtime.      Marland Kitchen zinc gluconate 50 MG tablet Take 50 mg by mouth daily.       No current facility-administered medications  for this visit.   Facility-Administered Medications Ordered in Other Visits  Medication Dose Route Frequency Provider Last Rate Last Dose  . CARBOplatin (PARAPLATIN) 430 mg in sodium chloride 0.9 % 250 mL chemo infusion  430 mg Intravenous Once [employer], PA-C      . Gemcitabine HCl (GEMZAR) 1,292 mg in sodium chloride 0.9 % 100 mL chemo infusion  800 mg/m2 (Treatment Plan Actual) Intravenous Once Zonya Gudger E Aishah Teffeteller, PA-C      . ondansetron (ZOFRAN) IVPB 16 mg  16 mg Intravenous Once Conni Slipper, PA-C   16 mg at 02/20/13 1027    SURGICAL HISTORY:  Past Surgical History  Procedure Laterality Date  . Total abdominal hysterectomy    . Shoulder arthroscopy    . Video bronchoscopy Bilateral 07/25/2012    Procedure: VIDEO BRONCHOSCOPY WITH FLUORO;  Surgeon: Nyoka Cowden, MD;  Location: [facility];  Service: Endoscopy;  Laterality: Bilateral;  . Lump removed from breasr right  2003  . Endobronchial ultrasound Bilateral 09/04/2012    Procedure: ENDOBRONCHIAL ULTRASOUND;  Surgeon: Leslye Peer, MD;  Location: [facility];  Service: Cardiopulmonary;  Laterality: Bilateral;    REVIEW OF SYSTEMS:  A comprehensive review of systems was negative except for: Musculoskeletal: positive for bone pain and myalgias   PHYSICAL EXAMINATION: General appearance: alert, cooperative, fatigued and no distress Head: Normocephalic, without obvious abnormality, atraumatic Neck: no adenopathy Lymph nodes: Cervical, supraclavicular, and axillary nodes normal. Resp: clear to auscultation bilaterally Cardio: regular rate and rhythm, S1, S2 normal, no murmur, click, rub or gallop GI: soft, non-tender; bowel sounds normal; no masses,  no organomegaly Extremities: extremities normal, atraumatic, no cyanosis or edema, point tenderness along the right shoulder and right anterior chest wall  ECOG PERFORMANCE STATUS: 1 - Symptomatic but completely ambulatory  Blood pressure 121/79, pulse 112, temperature  97.6 F (36.4 C), temperature source Oral, resp. rate 18, height 5\' 3"  (1.6 m), weight 134 lb 14.4 oz (61.19 kg).  LABORATORY DATA: Lab Results  Component Value Date   WBC 9.8 02/20/2013   HGB 13.0 02/20/2013   HCT 37.6 02/20/2013   MCV 97.7 02/20/2013   PLT 220 02/20/2013      Chemistry      Component Value Date/Time   NA 139 02/13/2013 0837   NA 138 09/04/2012 0815   K 3.9 02/13/2013 0837   K 3.4* 09/04/2012 0815   CL 104 11/13/2012 1122   CL 100 09/04/2012 0815   CO2 29 02/13/2013 0837   CO2 28 09/04/2012 0815   BUN 9.1 02/13/2013 0837   BUN 6 09/04/2012 0815   CREATININE 0.7 02/13/2013 0837   CREATININE 0.65 09/04/2012 0815      Component Value Date/Time   CALCIUM 9.6 02/13/2013 0837   CALCIUM 9.3 09/04/2012 0815   ALKPHOS 108 02/13/2013 0837   ALKPHOS 90 09/04/2012 0815   AST 38* 02/13/2013 0837   AST 22 09/04/2012  0815   ALT 50 02/13/2013 0837   ALT 16 09/04/2012 0815   BILITOT 0.34 02/13/2013 0837   BILITOT 0.6 09/04/2012 0815       RADIOGRAPHIC STUDIES: No results found.  ASSESSMENT AND PLAN: This is a very pleasant 57 years old white female with history of stage IIIa non-small cell lung cancer status post concurrent chemoradiation with weekly carboplatin and paclitaxel and currently on constipation chemotherapy with carboplatin and gemcitabine status post 2 cycles. She has tolerated the treatment fairly well except for fatigue from the chemotherapy-induced anemia. Her hemoglobin today is 13.0 with a normal platelet count of 220,000, white count is also within normal range at 9.8. She'll proceed with cycle #3 of her consolidation chemotherapy with carboplatin for an AUC of 5 given on day 1 and gemcitabine 1000 mg per meter squared given days 1 and 8 every 3 weeks. Regarding her pain management she will continue with Percocet as previously prescribed at 1-2 tablets by mouth every 4 hours as needed for pain. Her platelet count is in normal range and she may take if needed ibuprofen 200 mg by mouth no  more than one tablet every 8 hours with food. Patient voiced understanding. She will also followup with Dr. Arbutus Ped in 3 weeks with a restaging CT scan of her chest with contrast to reevaluate her disease. In the interim she will continue with weekly labs consisting of CBC differential and C. met as previously scheduled.  Laural Benes, Nahom Carfagno E, PA-C   She was advised to call immediately if she has any concerning symptoms in the interval. The patient voices understanding of current disease status and treatment options and is in agreement with the current care plan.  All questions were answered. The patient knows to call the clinic with any problems, questions or concerns. We can certainly see the patient much sooner if necessary.   ADDENDUM:  Hematology/Oncology Attending:  I have a face to face encounter with the patient during his visit. I recommended her care plan. The patient has a stage IIIA non-small cell lung cancer status post concurrent chemoradiation and currently undergoing consolidation chemotherapy with carboplatin and gemcitabine status post 2 cycles. She is tolerating her treatment fairly well with no significant complaints except for some arthritis pain on the right shoulder area. She is currently on Percocet an as-needed basis and advice addition to take 2-3 tablets of ibuprofen daily as needed. We will proceed with cycle #3 of her chemotherapy today as scheduled She would come back for follow up visit in 3 weeks with repeat CT scan of the chest for restaging of her disease. Lajuana Matte., MD 02/25/2013

## 2013-02-20 NOTE — Telephone Encounter (Signed)
, °

## 2013-02-22 ENCOUNTER — Encounter: Payer: Self-pay | Admitting: Radiation Oncology

## 2013-02-22 ENCOUNTER — Other Ambulatory Visit: Payer: Self-pay | Admitting: Radiation Oncology

## 2013-02-22 ENCOUNTER — Telehealth: Payer: Self-pay | Admitting: Oncology

## 2013-02-22 NOTE — Telephone Encounter (Signed)
Linda Donovan called and said she is still having pain that she is rating at a 7/10 on her right shoulder and right back.  She is taking the percocet and says she had some itching at first that is now gone.  She says the percocet takes the edge off the pain but does not relieve it and does not last very long.  She asked if she can get the long acting pain medication (oxycontin?) that Dr. Dayton Scrape mentioned on Monday.

## 2013-02-22 NOTE — Progress Notes (Signed)
Chart note: The patient called and is tolerating her oxycodone without pruritus. She wanted a prescription for OxyContin, but her drug plan does not allow for OxyContin, only extended release morphine sulfate. We're both concerned about pruritus with her be allergic to morphine. She was simply take immediate release oxycodone .

## 2013-02-26 ENCOUNTER — Encounter: Payer: Self-pay | Admitting: Radiation Oncology

## 2013-02-26 ENCOUNTER — Ambulatory Visit
Admission: RE | Admit: 2013-02-26 | Discharge: 2013-02-26 | Disposition: A | Discharge status: Home / Self Care | Payer: BC Managed Care – PPO | Source: Ambulatory Visit | Attending: Radiation Oncology | Admitting: Radiation Oncology

## 2013-02-26 VITALS — BP 136/96 | HR 121 | Temp 98.7°F | Ht 63.0 in | Wt 131.5 lb

## 2013-02-26 DIAGNOSIS — C3491 Malignant neoplasm of unspecified part of right bronchus or lung: Secondary | ICD-10-CM

## 2013-02-26 MED ORDER — OXYCODONE-ACETAMINOPHEN 5-325 MG PO TABS: 2.0000 | ORAL_TABLET | ORAL | Status: DC | PRN

## 2013-02-26 NOTE — Progress Notes (Signed)
Linda Donovan here with her mother for follow up after treatment to her right lung.  She rates her pain in her right chest, right shoulder, right upper back and right upper arm at a 4-5/10.  She says is it about 50% better than it was after she started taking percocet.  She reports that the pain in tolerable.  She is having trouble sleeping because she can't sleep on her right side or back.  She is currently taking 8 tablets a day.  She is also taking 200 mg of ibuprofen at bedtime.  She reports swelling over her right chest and right upper back.  She has lost 3 lbs since 9/16.  She reports a poor appetite and she also says she does not have any energy.

## 2013-02-26 NOTE — Progress Notes (Signed)
Radiation Oncology         [phone] ________________________________  Name: Linda Donovan MRN: [id number]  Date: 02/26/2013  DOB: 1956/02/27  Follow-Up Visit Note  CC: Pcp Not In System  Si Gaul, MD  Diagnosis:   Locally advanced non-small cell lung cancer  Interval Since Last Radiation:  3  months  Narrative:  The patient returns today for followup of her pain along the right chest region. Patient was placed on a Medrol Dosepak which helped somewhat. She however has had to have stronger pain medication was placed on oxycodone which has helped her to sleep better at night. Her insurance plan would not allow long-acting pain medication. Patient did have a refill of her oxycodone today. She complains of pain along the right upper anterior chest and right shoulder region. She denies any significant cough or hemoptysis. She continues on chemotherapy. She also complains of swelling in the right upper chest region. She denies any breathing problems when lying flat.                         ALLERGIES:  is allergic to clinoril and morphine and related.  Meds: Current Outpatient Prescriptions  Medication Sig Dispense Refill  . albuterol (PROVENTIL HFA;VENTOLIN HFA) 108 (90 BASE) MCG/ACT inhaler Inhale 2 puffs into the lungs every 6 (six) hours as needed for wheezing.      Marland Kitchen amphetamine-dextroamphetamine (ADDERALL) 20 MG tablet Take 20 mg by mouth daily before breakfast.       . Ascorbic Acid (VITAMIN C) 1000 MG tablet Take 1,000 mg by mouth daily. Taking emergen-c powder      . calcium-vitamin D (OSCAL WITH D) 250-125 MG-UNIT per tablet Take 1 tablet by mouth daily.      Marland Kitchen ibuprofen (ADVIL,MOTRIN) 200 MG tablet Take 200 mg by mouth at bedtime as needed for pain.      Marland Kitchen loratadine (CLARITIN) 10 MG tablet Take 10 mg by mouth daily as needed.       . mometasone (NASONEX) 50 MCG/ACT nasal spray Place 2 sprays into the nose daily as needed.       . Multiple Vitamins-Minerals (MULTIVITAMIN  WITH MINERALS) tablet Take 1 tablet by mouth daily.      Marland Kitchen oxyCODONE-acetaminophen (PERCOCET/ROXICET) 5-325 MG per tablet Take 2 tablets by mouth every 4 (four) hours as needed for pain.  60 tablet  0  . sucralfate (CARAFATE) 1 GM/10ML suspension Take 10 mLs (1 g total) by mouth 4 (four) times daily.  420 mL  2  . traZODone (DESYREL) 50 MG tablet Take 50 mg by mouth at bedtime.      Marland Kitchen zinc gluconate 50 MG tablet Take 50 mg by mouth daily.      Marland Kitchen HYDROcodone-acetaminophen (NORCO/VICODIN) 5-325 MG per tablet Take 2 tablets by mouth every 6 (six) hours as needed for pain.  60 tablet  0  . MethylPREDNISolone (MEDROL, PAK, PO) Take by mouth.      . prochlorperazine (COMPAZINE) 10 MG tablet Take 1 tablet (10 mg total) by mouth every 6 (six) hours as needed.  60 tablet  0   No current facility-administered medications for this encounter.    Physical Findings: The patient is in no acute distress. Patient is alert and oriented.  height is 5\' 3"  (1.6 m) and weight is 131 lb 8 oz (59.648 kg). Her temperature is 98.7 F (37.1 C). Her blood pressure is 136/96 and her pulse is 121. Her oxygen  saturation is 92%. .  No palpable supraclavicular or axillary adenopathy. I do not appreciate any venous jugular distention. The lungs are clear to auscultation. The heart has regular rhythm and rate. I do not appreciate any swelling along the right chest region  Lab Findings: Lab Results  Component Value Date   WBC 9.8 02/20/2013   HGB 13.0 02/20/2013   HCT 37.6 02/20/2013   MCV 97.7 02/20/2013   PLT 220 02/20/2013        Impression:  Chest and shoulder pain of unknown etiology. Patient will complete her chemotherapy soon and has been scheduled for scans in the near future.  Plan:  Percocet refill today as above.  _____________________________________  -----------------------------------  Billie Lade, PhD, MD

## 2013-02-27 ENCOUNTER — Ambulatory Visit (HOSPITAL_BASED_OUTPATIENT_CLINIC_OR_DEPARTMENT_OTHER): Payer: BC Managed Care – PPO

## 2013-02-27 ENCOUNTER — Ambulatory Visit (HOSPITAL_BASED_OUTPATIENT_CLINIC_OR_DEPARTMENT_OTHER): Payer: BC Managed Care – PPO | Admitting: Lab

## 2013-02-27 VITALS — BP 139/78 | HR 110 | Temp 97.8°F | Resp 20

## 2013-02-27 DIAGNOSIS — C3410 Malignant neoplasm of upper lobe, unspecified bronchus or lung: Secondary | ICD-10-CM

## 2013-02-27 DIAGNOSIS — C3491 Malignant neoplasm of unspecified part of right bronchus or lung: Secondary | ICD-10-CM

## 2013-02-27 DIAGNOSIS — Z5111 Encounter for antineoplastic chemotherapy: Secondary | ICD-10-CM

## 2013-02-27 DIAGNOSIS — C3490 Malignant neoplasm of unspecified part of unspecified bronchus or lung: Secondary | ICD-10-CM

## 2013-02-27 LAB — CBC WITH DIFFERENTIAL/PLATELET
Basophils Absolute: 0 10*3/uL (ref 0.0–0.1)
Eosinophils Absolute: 0 10*3/uL (ref 0.0–0.5)
HCT: 35.3 % (ref 34.8–46.6)
HGB: 12.2 g/dL (ref 11.6–15.9)
LYMPH%: 12.4 % — ABNORMAL LOW (ref 14.0–49.7)
MCV: 97 fL (ref 79.5–101.0)
MONO#: 0.3 10*3/uL (ref 0.1–0.9)
NEUT#: 4.9 10*3/uL (ref 1.5–6.5)
NEUT%: 82.1 % — ABNORMAL HIGH (ref 38.4–76.8)
Platelets: 245 10*3/uL (ref 145–400)
WBC: 6 10*3/uL (ref 3.9–10.3)
nRBC: 0 % (ref 0–0)

## 2013-02-27 LAB — COMPREHENSIVE METABOLIC PANEL (CC13)
ALT: 215 U/L — ABNORMAL HIGH (ref 0–55)
BUN: 8.4 mg/dL (ref 7.0–26.0)
CO2: 24 mEq/L (ref 22–29)
Calcium: 9.6 mg/dL (ref 8.4–10.4)
Chloride: 104 mEq/L (ref 98–109)
Creatinine: 0.6 mg/dL (ref 0.6–1.1)
Glucose: 123 mg/dl (ref 70–140)

## 2013-02-27 MED ORDER — PROCHLORPERAZINE MALEATE 10 MG PO TABS
10.0000 mg | ORAL_TABLET | Freq: Once | ORAL | Status: AC
  Administered 2013-02-27: 10 mg via ORAL

## 2013-02-27 MED ORDER — SODIUM CHLORIDE 0.9 % IV SOLN: 800.0000 mg/m2 | Freq: Once | INTRAVENOUS | Status: DC

## 2013-02-27 MED ORDER — PROCHLORPERAZINE MALEATE 10 MG PO TABS
ORAL_TABLET | ORAL | Status: AC
  Filled 2013-02-27: qty 1

## 2013-02-27 MED ORDER — SODIUM CHLORIDE 0.9 % IV SOLN
Freq: Once | INTRAVENOUS | Status: AC
  Administered 2013-02-27: 11:00:00 via INTRAVENOUS

## 2013-02-27 MED ORDER — SODIUM CHLORIDE 0.9 % IV SOLN
1292.0000 mg | Freq: Once | INTRAVENOUS | Status: AC
  Administered 2013-02-27: 1292 mg via INTRAVENOUS
  Filled 2013-02-27: qty 34

## 2013-02-27 NOTE — Patient Instructions (Addendum)
[facility] Discharge Instructions for Patients Receiving Chemotherapy  Today you received the following chemotherapy agents GEMZAR   To help prevent nausea and vomiting after your treatment, we encourage you to take your nausea medication IF NEEDED   If you develop nausea and vomiting that is not controlled by your nausea medication, call the clinic.   BELOW ARE SYMPTOMS THAT SHOULD BE REPORTED IMMEDIATELY:  *FEVER GREATER THAN 100.5 F  *CHILLS WITH OR WITHOUT FEVER  NAUSEA AND VOMITING THAT IS NOT CONTROLLED WITH YOUR NAUSEA MEDICATION  *UNUSUAL SHORTNESS OF BREATH  *UNUSUAL BRUISING OR BLEEDING  TENDERNESS IN MOUTH AND THROAT WITH OR WITHOUT PRESENCE OF ULCERS  *URINARY PROBLEMS  *BOWEL PROBLEMS  UNUSUAL RASH Items with * indicate a potential emergency and should be followed up as soon as possible.  Feel free to call the clinic you have any questions or concerns. The clinic phone number is [phone].

## 2013-02-28 ENCOUNTER — Telehealth: Payer: Self-pay | Admitting: *Deleted

## 2013-02-28 DIAGNOSIS — C3490 Malignant neoplasm of unspecified part of unspecified bronchus or lung: Secondary | ICD-10-CM

## 2013-02-28 MED ORDER — METHYLPREDNISOLONE (PAK) 4 MG PO TABS: ORAL_TABLET | ORAL | Status: DC

## 2013-02-28 NOTE — Telephone Encounter (Signed)
Pt called stating that after her chemotherapy she is experiencing increased SOB on excertion that is not relieved by her inhaler.  Per Dr Donnald Garre, okay to call in medrol dose pack.  SLJ

## 2013-03-02 ENCOUNTER — Telehealth: Payer: Self-pay | Admitting: *Deleted

## 2013-03-02 NOTE — Telephone Encounter (Signed)
Erroneous

## 2013-03-02 NOTE — Telephone Encounter (Signed)
Pt called stating that her SOB is still not resolved.  She is requesting a nebulizer treatment.  Per Forde Radon, she needs to go to the ED to be evaluated.  Pt states "I am not going to go to the ED".  She states she has a pulmonologist.  Advised that this is Adren's recommendation, but she can contact her pulmonologist to see if there if anything that can be done with her breathing treatments, otherwise she will need to go to the ED.  She must also report to the ED if anything changes or worsens.  Pt verbalized understanding.  SLJ

## 2013-03-08 ENCOUNTER — Ambulatory Visit (INDEPENDENT_AMBULATORY_CARE_PROVIDER_SITE_OTHER): Payer: BC Managed Care – PPO | Admitting: Internal Medicine

## 2013-03-08 ENCOUNTER — Telehealth: Payer: Self-pay | Admitting: Emergency Medicine

## 2013-03-08 ENCOUNTER — Ambulatory Visit (INDEPENDENT_AMBULATORY_CARE_PROVIDER_SITE_OTHER)
Admission: RE | Admit: 2013-03-08 | Discharge: 2013-03-08 | Disposition: A | Discharge status: Home / Self Care | Payer: BC Managed Care – PPO | Source: Ambulatory Visit | Attending: Internal Medicine | Admitting: Internal Medicine

## 2013-03-08 ENCOUNTER — Encounter: Payer: Self-pay | Admitting: Internal Medicine

## 2013-03-08 ENCOUNTER — Other Ambulatory Visit (INDEPENDENT_AMBULATORY_CARE_PROVIDER_SITE_OTHER): Payer: BC Managed Care – PPO

## 2013-03-08 VITALS — BP 100/68 | HR 114 | Temp 98.4°F | Ht 63.0 in | Wt 133.8 lb

## 2013-03-08 DIAGNOSIS — J449 Chronic obstructive pulmonary disease, unspecified: Secondary | ICD-10-CM

## 2013-03-08 DIAGNOSIS — J441 Chronic obstructive pulmonary disease with (acute) exacerbation: Secondary | ICD-10-CM

## 2013-03-08 DIAGNOSIS — Z79899 Other long term (current) drug therapy: Secondary | ICD-10-CM | POA: Insufficient documentation

## 2013-03-08 DIAGNOSIS — Z5111 Encounter for antineoplastic chemotherapy: Secondary | ICD-10-CM

## 2013-03-08 DIAGNOSIS — J4489 Other specified chronic obstructive pulmonary disease: Secondary | ICD-10-CM

## 2013-03-08 MED ORDER — PREDNISONE 10 MG PO TABS: ORAL_TABLET | ORAL | Status: DC

## 2013-03-08 MED ORDER — LEVOFLOXACIN 500 MG PO TABS: 500.0000 mg | ORAL_TABLET | Freq: Every day | ORAL | Status: DC

## 2013-03-08 NOTE — Assessment & Plan Note (Addendum)
She either has COPD exacerbation or pneumonia following chemotherapy. Advised as a high risk situation especially with hypoxemia 83% on room air at rest even though  corrected with oxygen 2 L. I recommended admission but she flatly refused. She is agreeable to blood work and chest x-ray today which I have ordered. He is agreeable to starting continuous oxygen 2 L at home today which have ordered. We'll currently treat for severe exacerbation with 12 day prednisone taper and 6 day Levaquin. If symptoms worsen she knows to go to the emergency room. We will call her with a blood test results and if something is very abnormal will advise her to go to the emergency room

## 2013-03-08 NOTE — Progress Notes (Signed)
Subjective:    Patient ID: Linda Donovan, female    DOB: 08/27/1955, 57 y.o.   MRN: [id number]  HPI Primary Linda Donovan  57 yowf quit smoking 1998 with sob that improved to point were stopped all medications x rare  saba  self referred to pulmonary clinic for hemoptysis.  1st pulmonary eval in EMR era/ [facility] cc  Sob with inclines and cold weather and steps and no increase in saba indolent onset rattling cough and throat congestion onset mid Jan 2014 with white phlegm streaked with blood and sev times a week esp in am's and no epistaxis. No more blood x 5 days, ? Back of throat.   No obvious pattern to  variabilty or assoc sorethroat cp or chest tightness, subjective wheeze overt sinus or hb symptoms. No unusual exp hx or h/o childhood pna/ asthma or premature birth to his knowledge.   ROV 08/25/12 -- 57 yo former smoker, seen by Dr Sherene Sires for hemoptysis. 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 S/p FOB that was non-diagnostic. She is referred after PET scan to consider another bx. Her hemoptysis has resolved.  She is c/o nasal gtt.   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 She is undergoing rx with Dr Arbutus Ped, concurrent chemo + XRT. She is very concerned that her FOB's aren't covered by [employer]. No more hemoptysis.  She has exertional dyspnea.    REC We will further investigate the insurance questions regarding your bronchoscopy.  We will wait to order any new breathing tests or to start any new medications for now. We will reconsider next time.  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 Also stage III lung cancer on chemotherapy  On sept,  23rd 2014 she had second cycle chemotherapy. On the following did she develop shortness of breath and was up and given some prednisone that she completed a few days ago but  since then she's had worsening shortness of breath along with increased cough and some change in sputum to green color. She was addressed with emergency room but she refused. So I am seeing acutely. On arrival she was 80% on room air at rest but corrected with 2 L of oxygen. Despite her severe COPD status prior hypoxemia has not been documented although she insists otherwise and says that hypoxemia stable and unchanged and baseline. He denies any fever, hemoptysis, orthopnea, paroxysmal nocturnal dyspnea edema. Symptoms are associated with severe fatigue  Strongly recommended admission but she flatly refused and accpeted liability for any roblems that might arise. 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  HENT: Negative for ear pain, nosebleeds, congestion, sore throat, rhinorrhea, sneezing, trouble swallowing, dental problem, postnasal drip and sinus pressure.   Eyes: Negative for redness and itching.  Respiratory: Positive for cough, chest tightness, shortness of breath and wheezing.   Cardiovascular: Negative for palpitations and leg swelling.  Gastrointestinal: Negative for nausea and vomiting.  Genitourinary: Negative for dysuria.  Musculoskeletal: Negative for joint swelling.  Skin: Negative for rash.  Neurological: Negative for headaches.  Hematological: Does not bruise/bleed easily.  Psychiatric/Behavioral: Negative for dysphoric mood. The patient is not nervous/anxious.  Objective:   Physical Exam  Vitals reviewed. Constitutional: She is oriented to person, place, and time. She appears well-developed and well-nourished. No distress.  Slowly developing alopecia Chronic unwell looking  HENT:  Head: Normocephalic and atraumatic.  Right Ear: External ear normal.  Left Ear: External ear normal.  Mouth/Throat: Oropharynx is clear and moist. No oropharyngeal  exudate.  Eyes: Conjunctivae and EOM are normal. Pupils are equal, round, and reactive to light. Right eye exhibits no discharge. Left eye exhibits no discharge. No scleral icterus.  Neck: Normal range of motion. Neck supple. No JVD present. No tracheal deviation present. No thyromegaly present.  Cardiovascular: Normal rate, regular rhythm, normal heart sounds and intact distal pulses.  Exam reveals no gallop and no friction rub.   No murmur heard. Pulmonary/Chest: Effort normal and breath sounds normal. No respiratory distress. She has no wheezes. She has no rales. She exhibits no tenderness.  Mild acc muscle use and puirse lip breathing No wheeze Prolonged expiration On O2 No visible distress  Abdominal: Soft. Bowel sounds are normal. She exhibits no distension and no mass. There is no tenderness. There is no rebound and no guarding.  Musculoskeletal: Normal range of motion. She exhibits no edema and no tenderness.  Lymphadenopathy:    She has no cervical adenopathy.  Neurological: She is alert and oriented to person, place, and time. She has normal reflexes. No cranial nerve deficit. She exhibits normal muscle tone. Coordination normal.  Skin: Skin is warm and dry. No rash noted. She is not diaphoretic. No erythema. No pallor.  Psychiatric: She has a normal mood and affect. Her behavior is normal. Judgment and thought content normal.          Assessment & Plan:

## 2013-03-08 NOTE — Patient Instructions (Addendum)
Please do blood work today Please do chest xray today SEt up portoiable O2, 2L continuous all 24h Take levaquin 500mg  once daily  X 6 days Please take Take prednisone 40mg  once daily x 3 days, then 30mg  once daily x 3 days, then 20mg  once daily x 3 days, then prednisone 10mg  once daily  x 3 days and stop If worse, go to ER REturn in 4-7 days to see DR Delton Coombes or Tammy the NP

## 2013-03-08 NOTE — Telephone Encounter (Signed)
Called, spoke with pt.  She finished prednisone on Sunday.  On Monday, chest tightness returned.  She is also having cough x 1 wk - difficult to cough mucus up, wheezing at times, and trouble breathing with any activity.  Pt states she is unable to use her inhaler effectively bc she cannot hold her breathing for the 10 seconds recommended.  D/t this, she is requesting neb meds.  Pt reports she called her oncologist office for this, but they directed her to call here.  Pt was last seen by RB on 09/25/12.  We have scheduled her to see MR today at 4:30 pm.  Pt aware.

## 2013-03-09 ENCOUNTER — Other Ambulatory Visit: Payer: Self-pay | Admitting: *Deleted

## 2013-03-09 DIAGNOSIS — C3490 Malignant neoplasm of unspecified part of unspecified bronchus or lung: Secondary | ICD-10-CM

## 2013-03-09 LAB — CBC WITH DIFFERENTIAL/PLATELET
HCT: 30.9 % — ABNORMAL LOW (ref 36.0–46.0)
MCHC: 33.6 g/dL (ref 30.0–36.0)
MCV: 99.8 fl (ref 78.0–100.0)
Platelets: 82 10*3/uL — ABNORMAL LOW (ref 150.0–400.0)
RBC: 3.1 Mil/uL — ABNORMAL LOW (ref 3.87–5.11)
RDW: 16.1 % — ABNORMAL HIGH (ref 11.5–14.6)
WBC: 6 10*3/uL (ref 4.5–10.5)

## 2013-03-09 LAB — BASIC METABOLIC PANEL
CO2: 30 mEq/L (ref 19–32)
Calcium: 8.9 mg/dL (ref 8.4–10.5)
Creatinine, Ser: 0.6 mg/dL (ref 0.4–1.2)
GFR: 120.37 mL/min (ref >60.00)
Sodium: 135 mEq/L (ref 135–145)

## 2013-03-09 LAB — PHOSPHORUS: Phosphorus: 3.3 mg/dL (ref 2.3–4.6)

## 2013-03-12 ENCOUNTER — Other Ambulatory Visit (HOSPITAL_BASED_OUTPATIENT_CLINIC_OR_DEPARTMENT_OTHER): Payer: BC Managed Care – PPO | Admitting: Lab

## 2013-03-12 ENCOUNTER — Ambulatory Visit (HOSPITAL_COMMUNITY)
Admission: RE | Admit: 2013-03-12 | Discharge: 2013-03-12 | Disposition: A | Discharge status: Home / Self Care | Payer: BC Managed Care – PPO | Source: Ambulatory Visit | Attending: Physician Assistant | Admitting: Physician Assistant

## 2013-03-12 ENCOUNTER — Telehealth: Payer: Self-pay | Admitting: Internal Medicine

## 2013-03-12 DIAGNOSIS — C3490 Malignant neoplasm of unspecified part of unspecified bronchus or lung: Secondary | ICD-10-CM | POA: Insufficient documentation

## 2013-03-12 DIAGNOSIS — R599 Enlarged lymph nodes, unspecified: Secondary | ICD-10-CM | POA: Insufficient documentation

## 2013-03-12 DIAGNOSIS — K8020 Calculus of gallbladder without cholecystitis without obstruction: Secondary | ICD-10-CM | POA: Insufficient documentation

## 2013-03-12 DIAGNOSIS — C3410 Malignant neoplasm of upper lobe, unspecified bronchus or lung: Secondary | ICD-10-CM

## 2013-03-12 DIAGNOSIS — J90 Pleural effusion, not elsewhere classified: Secondary | ICD-10-CM | POA: Insufficient documentation

## 2013-03-12 LAB — CBC WITH DIFFERENTIAL/PLATELET
BASO%: 0.2 % (ref 0.0–2.0)
EOS%: 0 % (ref 0.0–7.0)
LYMPH%: 5.8 % — ABNORMAL LOW (ref 14.0–49.7)
MCH: 33.6 pg (ref 25.1–34.0)
MCHC: 33.1 g/dL (ref 31.5–36.0)
MCV: 101.5 fL — ABNORMAL HIGH (ref 79.5–101.0)
MONO%: 14.9 % — ABNORMAL HIGH (ref 0.0–14.0)
NEUT#: 5.6 10*3/uL (ref 1.5–6.5)
NEUT%: 79.1 % — ABNORMAL HIGH (ref 38.4–76.8)
Platelets: 396 10*3/uL (ref 145–400)
RBC: 3.45 10*6/uL — ABNORMAL LOW (ref 3.70–5.45)
RDW: 18.6 % — ABNORMAL HIGH (ref 11.2–14.5)

## 2013-03-12 LAB — COMPREHENSIVE METABOLIC PANEL (CC13)
ALT: 43 U/L (ref 0–55)
AST: 35 U/L — ABNORMAL HIGH (ref 5–34)
Albumin: 3.4 g/dL — ABNORMAL LOW (ref 3.5–5.0)
Alkaline Phosphatase: 99 U/L (ref 40–150)
Creatinine: 0.7 mg/dL (ref 0.6–1.1)
Glucose: 106 mg/dl (ref 70–140)
Potassium: 3.6 mEq/L (ref 3.5–5.1)
Sodium: 139 mEq/L (ref 136–145)
Total Bilirubin: 0.37 mg/dL (ref 0.20–1.20)
Total Protein: 7.4 g/dL (ref 6.4–8.3)

## 2013-03-12 MED ORDER — MAGNESIUM OXIDE 400 MG PO TABS: 400.0000 mg | ORAL_TABLET | Freq: Every day | ORAL | Status: DC

## 2013-03-12 MED ORDER — IOHEXOL 300 MG/ML  SOLN
80.0000 mL | Freq: Once | INTRAMUSCULAR | Status: AC | PRN
  Administered 2013-03-12: 80 mL via INTRAVENOUS

## 2013-03-12 NOTE — Telephone Encounter (Signed)
Triage (cc to [facility])  Sending to you because this is a [facility] patient I saw as acute 4 days ago. Labs back   - more anemic than ususal - hgb 10gm% so watch - chemistriies normal except MAG low at 1.4mg % and will contribute to feeling weak - call in mag oxide 400mg  bid. Recheck mag levels in one week during fu with Rob or Tammy  Thanks  Dr. Kalman Shan, M.D., [facility] Pulmonary and Critical Care Medicine Staff Physician [facility] [place] [facility] Pager: [phone], If no answer or between  15:00h - 7:00h: call [phone]  03/12/2013 8:58 AM

## 2013-03-12 NOTE — Telephone Encounter (Signed)
I spoke with the pt and advised of  Results and rx sent. Carron Curie, CMA

## 2013-03-13 ENCOUNTER — Ambulatory Visit (HOSPITAL_BASED_OUTPATIENT_CLINIC_OR_DEPARTMENT_OTHER): Payer: BC Managed Care – PPO | Admitting: Internal Medicine

## 2013-03-13 ENCOUNTER — Encounter: Payer: Self-pay | Admitting: Internal Medicine

## 2013-03-13 ENCOUNTER — Telehealth: Payer: Self-pay | Admitting: Internal Medicine

## 2013-03-13 VITALS — BP 142/89 | HR 111 | Temp 97.7°F | Resp 18 | Ht 63.0 in | Wt 133.4 lb

## 2013-03-13 DIAGNOSIS — M25519 Pain in unspecified shoulder: Secondary | ICD-10-CM

## 2013-03-13 DIAGNOSIS — C3491 Malignant neoplasm of unspecified part of right bronchus or lung: Secondary | ICD-10-CM

## 2013-03-13 DIAGNOSIS — C3410 Malignant neoplasm of upper lobe, unspecified bronchus or lung: Secondary | ICD-10-CM

## 2013-03-13 NOTE — Progress Notes (Signed)
[facility] [phone]   [phone]  OFFICE PROGRESS NOTE   DIAGNOSIS: Stage IIIa non-small cell lung cancer, adenocarcinoma with negative EGFR mutation and negative ALK gene translocation diagnosed in March of 2014   PRIOR THERAPY:  1) Concurrent chemoradiation with weekly carboplatin for AUC of 2 and paclitaxel 45 mg/M2, status post 8 cycles, last dose was given on 11/13/2012 with partial response.  2) Consolidation chemotherapy with carboplatin for AUC of 5 on day 1 and gemcitabine 1000 mg/M2 on days 1 and 8 every 3 weeks, status post 3 cycles, last dose was given 02/20/2013 with mild improvement in her disease . First cycle was given on 01/02/2013.   CURRENT THERAPY: Observation  CHEMOTHERAPY INTENT: Control  CURRENT # OF CHEMOTHERAPY CYCLES: 0  CURRENT ANTIEMETICS: Zofran, dexamethasone and Compazine  CURRENT SMOKING STATUS: Former smoker, quit in 1990  ORAL CHEMOTHERAPY AND CONSENT: None  CURRENT BISPHOSPHONATES USE: None  PAIN MANAGEMENT: 8/10, currently Percocet 5/325, 1-2 tabs q 4 hours  NARCOTICS INDUCED CONSTIPATION: None  LIVING WILL AND CODE STATUS: Full code  INTERVAL HISTORY: Linda Donovan 57 y.o. female returns to the clinic today for followup visit accompanied by her husband and daughter. The patient is feeling fine today with no specific complaints except for right shoulder pain. She was seen recently by Dr. Roselind Messier and he indicated that the pain could be coming from the previous radiotherapy to the right lung. The patient tolerated the previous course of consolidation chemotherapy with carboplatin and gemcitabine fairly well with no significant adverse effects. She denied having any nausea or vomiting, no fever or chills. The patient denied having any significant chest pain but continues to have shortness of breath with exertion with no cough or hemoptysis. She had repeat CT scan of the chest performed recently and she is here for  evaluation and discussion of her scan results.  MEDICAL HISTORY: Past Medical History  Diagnosis Date  . COPD (chronic obstructive pulmonary disease)   . ADD (attention deficit disorder)   . Non-small cell lung cancer 09/18/2012    RUL  . Hx of radiation therapy 09/28/12- 11/17/12    RU lung mass, mediastinum chest, 63 gray 35 fx    ALLERGIES:  is allergic to clinoril and morphine and related.  MEDICATIONS:  Current Outpatient Prescriptions  Medication Sig Dispense Refill  . albuterol (PROVENTIL HFA;VENTOLIN HFA) 108 (90 BASE) MCG/ACT inhaler Inhale 2 puffs into the lungs every 6 (six) hours as needed for wheezing.      Marland Kitchen amphetamine-dextroamphetamine (ADDERALL) 20 MG tablet Take 20 mg by mouth daily before breakfast.       . Ascorbic Acid (VITAMIN C) 1000 MG tablet Take 1,000 mg by mouth daily. Taking emergen-c powder      . calcium-vitamin D (OSCAL WITH D) 250-125 MG-UNIT per tablet Take 1 tablet by mouth daily.      Marland Kitchen ibuprofen (ADVIL,MOTRIN) 200 MG tablet Take 200 mg by mouth at bedtime as needed for pain.      Marland Kitchen levofloxacin (LEVAQUIN) 500 MG tablet Take 1 tablet (500 mg total) by mouth daily.  6 tablet  0  . loratadine (CLARITIN) 10 MG tablet Take 10 mg by mouth daily as needed.       . magnesium oxide (MAG-OX) 400 MG tablet Take 1 tablet (400 mg total) by mouth daily.  60 tablet  2  . mometasone (NASONEX) 50 MCG/ACT nasal spray Place 2 sprays into the nose daily as needed.       Marland Kitchen  Multiple Vitamins-Minerals (MULTIVITAMIN WITH MINERALS) tablet Take 1 tablet by mouth daily.      . predniSONE (DELTASONE) 10 MG tablet Take 4 tabs daily x 3 days, then 3 tabs daily x 3 days, then 2 tabs daily x 3 days, then 1 tab daily x 3 days, then stop.  30 tablet  0  . traZODone (DESYREL) 50 MG tablet Take 50 mg by mouth at bedtime.      Marland Kitchen zinc gluconate 50 MG tablet Take 50 mg by mouth daily.      Marland Kitchen HYDROcodone-acetaminophen (NORCO/VICODIN) 5-325 MG per tablet Take 2 tablets by mouth every 6 (six)  hours as needed for pain.  60 tablet  0  . oxyCODONE-acetaminophen (PERCOCET/ROXICET) 5-325 MG per tablet Take 2 tablets by mouth every 4 (four) hours as needed for pain.  60 tablet  0  . prochlorperazine (COMPAZINE) 10 MG tablet Take 1 tablet (10 mg total) by mouth every 6 (six) hours as needed.  60 tablet  0  . sucralfate (CARAFATE) 1 GM/10ML suspension Take 10 mLs (1 g total) by mouth 4 (four) times daily.  420 mL  2   No current facility-administered medications for this visit.    SURGICAL HISTORY:  Past Surgical History  Procedure Laterality Date  . Total abdominal hysterectomy    . Shoulder arthroscopy    . Video bronchoscopy Bilateral 07/25/2012    Procedure: VIDEO BRONCHOSCOPY WITH FLUORO;  Surgeon: Nyoka Cowden, MD;  Location: [facility];  Service: Endoscopy;  Laterality: Bilateral;  . Lump removed from breasr right  2003  . Endobronchial ultrasound Bilateral 09/04/2012    Procedure: ENDOBRONCHIAL ULTRASOUND;  Surgeon: Leslye Peer, MD;  Location: [facility];  Service: Cardiopulmonary;  Laterality: Bilateral;    REVIEW OF SYSTEMS:  Constitutional: negative Eyes: negative Ears, nose, mouth, throat, and face: negative Respiratory: positive for dyspnea on exertion Cardiovascular: negative Gastrointestinal: negative Genitourinary:negative Integument/breast: negative Hematologic/lymphatic: negative Musculoskeletal:positive for Right shoulder pain Neurological: negative Behavioral/Psych: negative Endocrine: negative Allergic/Immunologic: negative   PHYSICAL EXAMINATION: General appearance: alert, cooperative, fatigued and no distress Head: Normocephalic, without obvious abnormality, atraumatic Neck: no adenopathy, no JVD, supple, symmetrical, trachea midline and thyroid not enlarged, symmetric, no tenderness/mass/nodules Lymph nodes: Cervical, supraclavicular, and axillary nodes normal. Resp: clear to auscultation bilaterally and normal percussion bilaterally Back:  symmetric, no curvature. ROM normal. No CVA tenderness. Cardio: regular rate and rhythm, S1, S2 normal, no murmur, click, rub or gallop and normal apical impulse GI: soft, non-tender; bowel sounds normal; no masses,  no organomegaly Extremities: extremities normal, atraumatic, no cyanosis or edema Neurologic: Alert and oriented X 3, normal strength and tone. Normal symmetric reflexes. Normal coordination and gait  ECOG PERFORMANCE STATUS: 1 - Symptomatic but completely ambulatory  Blood pressure 142/89, pulse 111, temperature 97.7 F (36.5 C), temperature source Oral, resp. rate 18, height 5\' 3"  (1.6 m), weight 133 lb 6.4 oz (60.51 kg).  LABORATORY DATA: Lab Results  Component Value Date   WBC 7.1 03/12/2013   HGB 11.6 03/12/2013   HCT 35.0 03/12/2013   MCV 101.5* 03/12/2013   PLT 396 03/12/2013      Chemistry      Component Value Date/Time   NA 139 03/12/2013 0851   NA 135 03/08/2013 1734   K 3.6 03/12/2013 0851   K 4.1 03/08/2013 1734   CL 101 03/08/2013 1734   CL 104 11/13/2012 1122   CO2 24 03/12/2013 0851   CO2 30 03/08/2013 1734   BUN 7.8 03/12/2013 0851  BUN 8 03/08/2013 1734   CREATININE 0.7 03/12/2013 0851   CREATININE 0.6 03/08/2013 1734      Component Value Date/Time   CALCIUM 9.5 03/12/2013 0851   CALCIUM 8.9 03/08/2013 1734   ALKPHOS 99 03/12/2013 0851   ALKPHOS 90 09/04/2012 0815   AST 35* 03/12/2013 0851   AST 22 09/04/2012 0815   ALT 43 03/12/2013 0851   ALT 16 09/04/2012 0815   BILITOT 0.37 03/12/2013 0851   BILITOT 0.6 09/04/2012 0815       RADIOGRAPHIC STUDIES: Dg Chest 2 View  03/08/2013   CLINICAL DATA:  Cough, shortness of breath, former smoker, COPD, non-small-cell lung cancer  EXAM: CHEST  2 VIEW  COMPARISON:  07/25/2012  Correlation:  Interval CT chest 12/22/2012  FINDINGS: Enlargement of cardiac silhouette.  Mediastinal contours normal.  Mild volume loss in the right hemi thorax with mediastinal shift to the right.  Again identified right upper lobe mass,  decreased in size since previous exam.  Mass is somewhat poorly defined, best estimate 5.4 x 3.8 x 3.9 cm in size.  Severe underlying emphysematous changes with chronic peribronchial thickening and scattered chronic interstitial lung disease.  No definite acute infiltrate, pleural effusion or pneumothorax.  No acute osseous findings.  IMPRESSION: Severe COPD changes.  Decrease in size of right upper lobe tumor.  No definite acute abnormalities.   Electronically Signed   By: Ulyses Southward M.D.   On: 03/08/2013 17:38   Ct Chest W Contrast  03/12/2013   CLINICAL DATA:  Restaging non-small cell lung cancer  EXAM: CT CHEST WITH CONTRAST  TECHNIQUE: Multidetector CT imaging of the chest was performed during intravenous contrast administration.  CONTRAST:  80mL OMNIPAQUE IOHEXOL 300 MG/ML  SOLN  COMPARISON:  CT chest 12/22/2012  FINDINGS: There is a moderate-sized right pleural effusion which is new from previous exam. Right upper lobe lung mass is again identified. On today's study this measures 2.8 x 5.4 x 2.9 cm, image 17/series 2. On the previous examination this measured 3.3 x 5.8 x 3.6 cm. Advanced changes of paraseptal and centrilobular emphysema identified.  The trachea appears patent and is midline. Heart size is normal. No pericardial effusion identified. Enlarged sub- carinal lymph node measures 2.2 cm, image 25/ series 2. Previously 2.7 cm. No new or progressive adenopathy identified.  No axillary or supraclavicular adenopathy noted. Dense breast tissue stress set limited imaging through the upper abdomen again shows a gallstone. No acute findings are identified.  Review of the visualized osseous structures is on unremarkable. No aggressive lytic or sclerotic bone lesions.  IMPRESSION: 1. Mild decrease in size of right upper lobe lung mass and sub- carinal adenopathy.  2.  New moderate size right pleural effusion.   Electronically Signed   By: Signa Kell M.D.   On: 03/12/2013 08:52    ASSESSMENT AND  PLAN: This is a very pleasant 57 years old white female with stage IIIa non-small cell lung cancer, adenocarcinoma with negative EGFR mutation and negative ALK gene translocation is status post concurrent chemoradiation with weekly carboplatin and paclitaxel followed by consolidation chemotherapy with 3 cycles of carboplatin and gemcitabine. Her recent scan showed mild improvement in the right upper lobe lung mass and subcarinal adenopathy with no evidence for disease progression except for new moderate sized pleural effusion. I discussed the scan results and showed the images to the patient and her family. I recommended for her to continue on observation for now with repeat CT scan of the chest in  3 months. For the shortness of breath, the patient will follow with her pulmonologist Dr. Delton Coombes and she may be considered for a right thoracentesis if the right pleural effusion increased in size. The patient was advised to call immediately if she has any concerning symptoms in the interval.  The patient voices understanding of current disease status and treatment options and is in agreement with the current care plan.  All questions were answered. The patient knows to call the clinic with any problems, questions or concerns. We can certainly see the patient much sooner if necessary.  I spent 15 minutes counseling the patient face to face. The total time spent in the appointment was 25 minutes.

## 2013-03-13 NOTE — Telephone Encounter (Signed)
Gave pt appt for lab and MD on January 2015 CT before MD visit

## 2013-03-13 NOTE — Patient Instructions (Signed)
CURRENT THERAPY: Observation  CHEMOTHERAPY INTENT: Control  CURRENT # OF CHEMOTHERAPY CYCLES: 0  CURRENT ANTIEMETICS: Zofran, dexamethasone and Compazine  CURRENT SMOKING STATUS: Former smoker, quit in 1990  ORAL CHEMOTHERAPY AND CONSENT: None  CURRENT BISPHOSPHONATES USE: None  PAIN MANAGEMENT: 8/10, currently Percocet 5/325, 1-2 tabs q 4 hours  NARCOTICS INDUCED CONSTIPATION: None  LIVING WILL AND CODE STATUS: Full code

## 2013-03-14 ENCOUNTER — Other Ambulatory Visit: Payer: BC Managed Care – PPO | Admitting: Lab

## 2013-03-15 ENCOUNTER — Encounter: Payer: Self-pay | Admitting: Adult Health

## 2013-03-15 ENCOUNTER — Ambulatory Visit (INDEPENDENT_AMBULATORY_CARE_PROVIDER_SITE_OTHER): Payer: BC Managed Care – PPO | Admitting: Adult Health

## 2013-03-15 VITALS — BP 134/84 | HR 110 | Temp 97.5°F | Ht 63.0 in | Wt 133.4 lb

## 2013-03-15 DIAGNOSIS — C3490 Malignant neoplasm of unspecified part of unspecified bronchus or lung: Secondary | ICD-10-CM

## 2013-03-15 DIAGNOSIS — J441 Chronic obstructive pulmonary disease with (acute) exacerbation: Secondary | ICD-10-CM

## 2013-03-15 MED ORDER — TIOTROPIUM BROMIDE MONOHYDRATE 18 MCG IN CAPS: 18.0000 ug | ORAL_CAPSULE | Freq: Every day | RESPIRATORY_TRACT | Status: DC

## 2013-03-15 NOTE — Progress Notes (Signed)
Subjective:    Patient ID: Linda Donovan, female    DOB: 10-08-1955, 57 y.o.   MRN: [id number]  HPI Primary [name]  57 yowf quit smoking 1998 with sob that improved to point were stopped all medications x rare  saba  self referred to pulmonary clinic for hemoptysis.  1st pulmonary eval in EMR era/ [facility]  Sob with inclines and cold weather and steps and no increase in saba indolent onset rattling cough and throat congestion onset mid Jan 2014 with white phlegm streaked with blood and sev times a week esp in am's and no epistaxis. No more blood x 5 days, ? Back of throat.   No obvious pattern to  variabilty or assoc sorethroat cp or chest tightness, subjective wheeze overt sinus or hb symptoms. No unusual exp hx or h/o childhood pna/ asthma or premature birth to his knowledge.   ROV 08/25/12 -- 57 yo former smoker, seen by Dr Sherene Sires for hemoptysis. 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 S/p FOB that was non-diagnostic. She is referred after PET scan to consider another bx. Her hemoptysis has resolved.  She is c/o nasal gtt.   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 She is undergoing rx with Dr Arbutus Ped, concurrent chemo + XRT. She is very concerned that her FOB's aren't covered by [employer]. No more hemoptysis.  She has exertional dyspnea.    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 Also stage III lung cancer on chemotherapy  On sept,  23rd 2014 she had second cycle chemotherapy. On the following did she develop shortness of breath and was up and given some prednisone that she completed a few days ago but since then she's had worsening shortness of breath along with increased cough and some change in sputum to green color. She was addressed with emergency room but she refused. So I am seeing acutely. On arrival she was 80% on room air at rest but corrected with 2 L of oxygen. Despite  her severe COPD status prior hypoxemia has not been documented although she insists otherwise and says that hypoxemia stable and unchanged and baseline. He denies any fever, hemoptysis, orthopnea, paroxysmal nocturnal dyspnea edema. Symptoms are associated with severe fatigue  Strongly recommended admission but she flatly refused and accpeted liability for any roblems that might arise. 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   03/15/2013 Follow up  Patient returns for a one-week followup. Patient was seen last week with a COPD exacerbation. She was treated with Levaquin and a prednisone taper. Chest x-ray shows COPD changes, and decreased right upper lobe tumor size. Patient has a known history of lung cancer is followed by Dr. Gwenyth Bouillon. She finished her last chemotherapy treatment on September 23. She's completed her radiation therapy. Cycle. CT chest on October 6, showed mild improvement in right upper lobe lung mass and subcarinal adenopathy with no evidence for disease progression. She did have a new small to moderate size pleural effusion.  Patient returns today reporting that she is feeling much improved. She has decreased shortness, of breath and cough. Patient denies any hemoptysis, orthopnea, PND, leg swelling, nausea, vomiting, choking, dysphagia. Patient had been recommended to start oxygen therapy. Due to exertional desaturations. At last visit. However oxygen  order was not completed. Today in the office. Patient's O2 saturations are 90-92% on room air at rest. However, walking, O2 saturation is 85% on room air. Patient has agreed to start on oxygen therapy at home.    Review of Systems  Constitutional: Negative for fever and unexpected weight change.  HENT: Negative for ear pain, nosebleeds, congestion, sore throat, rhinorrhea, sneezing, trouble swallowing, dental problem,  postnasal drip and sinus pressure.   Eyes: Negative for redness and itching.  Respiratory: Positive for cough, no hemoptysis Cardiovascular: Negative for palpitations and leg swelling.  No calf pain .  Gastrointestinal: Negative for nausea and vomiting.  Genitourinary: Negative for dysuria.  Musculoskeletal: Negative for joint swelling.  Skin: Negative for rash.  Neurological: Negative for headaches.  Hematological: Does not bruise/bleed easily.  Psychiatric/Behavioral: Negative for dysphoric mood. The patient is not nervous/anxious.        Objective:   Physical Exam  GEN: A/Ox3; pleasant , NAD, well nourished   HEENT:  [place]/AT,  EACs-clear, TMs-wnl, NOSE-clear, THROAT-clear, no lesions, no postnasal drip or exudate noted.   NECK:  Supple w/ fair ROM; no JVD; normal carotid impulses w/o bruits; no thyromegaly or nodules palpated; no lymphadenopathy.  RESP  Clear  P & A; w/o, wheezes/ rales/ or rhonchi.no accessory muscle use, no dullness to percussion  CARD:  RRR, no m/r/g  , no peripheral edema, pulses intact, no cyanosis or clubbing.  GI:   Soft & nt; nml bowel sounds; no organomegaly or masses detected.  Musco: Warm bil, no deformities or joint swelling noted.   Neuro: alert, no focal deficits noted.    Skin: Warm, no lesions or rashes  f       Assessment & Plan:

## 2013-03-15 NOTE — Assessment & Plan Note (Signed)
Continue followup with Dr. Gwenyth Bouillon as planned

## 2013-03-15 NOTE — Patient Instructions (Signed)
Finish Prednisone taper over next week.  Begin Oxygen 2l/m with walking.  Mucinex DM Twice daily  As needed  Cough/congestion .  Begin Spiriva 1 puff daily  follow up Dr. Delton Coombes  In 3 weeks with chest xray  Please contact office for sooner follow up if symptoms do not improve or worsen or seek emergency care

## 2013-03-15 NOTE — Assessment & Plan Note (Addendum)
Recent exacerbation, now resolving. Now with exertional desaturation Patient is advised to finish prednisone as directed. We'll add in Spiriva 1 puff daily Continue on Symbicort 2 puffs twice daily Begin oxygen at 2 L with activity. Reviewed CT chest with Dr. Delton Coombes, patient is clinically improved . We'll hold off on any thoracentesis as pleural effusion on right, is small in size. Patient will return in 3-4 weeks with a followup. Chest x-ray  Plan  Finish Prednisone taper over next week.  Begin Oxygen 2l/m with walking.  Mucinex DM Twice daily  As needed  Cough/congestion .  Begin Spiriva 1 puff daily  follow up Dr. Delton Coombes  In 3 weeks with chest xray  Please contact office for sooner follow up if symptoms do not improve or worsen or seek emergency care

## 2013-03-19 ENCOUNTER — Other Ambulatory Visit: Payer: Self-pay | Admitting: Radiation Oncology

## 2013-03-19 ENCOUNTER — Telehealth: Payer: Self-pay | Admitting: Oncology

## 2013-03-19 DIAGNOSIS — C3491 Malignant neoplasm of unspecified part of right bronchus or lung: Secondary | ICD-10-CM

## 2013-03-19 MED ORDER — OXYCODONE-ACETAMINOPHEN 5-325 MG PO TABS: 2.0000 | ORAL_TABLET | ORAL | Status: DC | PRN

## 2013-03-19 NOTE — Telephone Encounter (Signed)
Linda Donovan called and needs a refill on her oxycodone acetaminophen.  She is still having pain in her right chest/right shoulder.  She says the right shoulder is the worst.  She is wondering if a cortisone shot would help.  She says the pain medication works to dull the pain but doesn't totally take it away.  She is wondering what is causing the pain.  She also takes 2 ibuprofen 200 mg per day.

## 2013-03-20 ENCOUNTER — Telehealth: Payer: Self-pay | Admitting: *Deleted

## 2013-03-20 ENCOUNTER — Telehealth: Payer: Self-pay | Admitting: Oncology

## 2013-03-20 NOTE — Telephone Encounter (Signed)
CALLED PATIENT TO INFORM OF APPT. WITH DR. SUPPLE ON 04-02-13 - ARRIVAL TIME - 2:15 PM, SPOKE WITH PATIENT AND SHE IS AWARE OF THIS APPT.

## 2013-03-20 NOTE — Telephone Encounter (Signed)
Called to let Linda Donovan know that her prescription for oxycodone/acetaminophen is ready for pickup at nursing.

## 2013-04-06 ENCOUNTER — Telehealth: Payer: Self-pay | Admitting: Emergency Medicine

## 2013-04-06 MED ORDER — DOXYCYCLINE HYCLATE 100 MG PO TABS: 100.0000 mg | ORAL_TABLET | Freq: Two times a day (BID) | ORAL | Status: DC

## 2013-04-06 NOTE — Telephone Encounter (Signed)
Called and spoke with pt and she stated that she is getting sick again.  She says that her chest is tight, cough with green sputum, and is requesting that abx be called in for her.  She stated that she has appt with RB on Monday at 1:30.  RB please advise. Thanks  Allergies  Allergen Reactions  . Clinoril [Sulindac]   . Morphine And Related     itch

## 2013-04-06 NOTE — Telephone Encounter (Signed)
Called and spoke with pt and she is aware of recs from RB.  Pt is aware of med sent to the pharmacy.  Nothing further is needed.

## 2013-04-06 NOTE — Telephone Encounter (Signed)
I spoke with pt. She wanted to know what type ofOTC cough syrup she can take. I advised her she can take delsym. Pt is still asking for ABX until her appt. Please advise as pt would liek to pick this up soon. Thanks  Allergies  Allergen Reactions  . Clinoril [Sulindac]   . Morphine And Related     itch

## 2013-04-06 NOTE — Telephone Encounter (Signed)
Patient calling back about abx, but is now also wanting cough syrup.

## 2013-04-06 NOTE — Telephone Encounter (Signed)
Give doxy 100 bid x 7 days

## 2013-04-09 ENCOUNTER — Encounter: Payer: Self-pay | Admitting: Emergency Medicine

## 2013-04-09 ENCOUNTER — Ambulatory Visit (INDEPENDENT_AMBULATORY_CARE_PROVIDER_SITE_OTHER): Payer: BC Managed Care – PPO | Admitting: Emergency Medicine

## 2013-04-09 VITALS — BP 100/58 | HR 121 | Ht 63.0 in | Wt 130.0 lb

## 2013-04-09 DIAGNOSIS — J441 Chronic obstructive pulmonary disease with (acute) exacerbation: Secondary | ICD-10-CM

## 2013-04-09 DIAGNOSIS — C3490 Malignant neoplasm of unspecified part of unspecified bronchus or lung: Secondary | ICD-10-CM

## 2013-04-09 NOTE — Progress Notes (Signed)
Subjective:    Patient ID: Linda Donovan, female    DOB: 02/15/1956, 57 y.o.   MRN: [id number]  HPI Primary Linda Donovan  57 yowf quit smoking 1998 with sob that improved to point were stopped all medications x rare  saba  self referred to pulmonary clinic for hemoptysis.  1st pulmonary eval in EMR era/ [facility]  Sob with inclines and cold weather and steps and no increase in saba indolent onset rattling cough and throat congestion onset mid Jan 2014 with white phlegm streaked with blood and sev times a week esp in am's and no epistaxis. No more blood x 5 days, ? Back of throat.   No obvious pattern to  variabilty or assoc sorethroat cp or chest tightness, subjective wheeze overt sinus or hb symptoms. No unusual exp hx or h/o childhood pna/ asthma or premature birth to his knowledge.   ROV 08/25/12 -- 57 yo former smoker, seen by Dr Sherene Sires for hemoptysis. 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 S/p FOB that was non-diagnostic. She is referred after PET scan to consider another bx. Her hemoptysis has resolved.  She is c/o nasal gtt.   ROV 09/25/12 -- 58 yo former smoker w new dx adenoCA by FOB beginning of April. She is undergoing rx with Dr Arbutus Ped, concurrent chemo + XRT. She is very concerned that her FOB's aren't covered by [employer]. No more hemoptysis.  She has exertional dyspnea.    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 Also stage III lung cancer on chemotherapy  On sept,  23rd 2014 she had second cycle chemotherapy. On the following did she develop shortness of breath and was up and given some prednisone that she completed a few days ago but since then she's had worsening shortness of breath along with increased cough and some change in sputum to green color. She was addressed with emergency room but she refused. So I am seeing acutely. On arrival she was 80% on room air at rest but corrected with 2 L of oxygen. Despite  her severe COPD status prior hypoxemia has not been documented although she insists otherwise and says that hypoxemia stable and unchanged and baseline. He denies any fever, hemoptysis, orthopnea, paroxysmal nocturnal dyspnea edema. Symptoms are associated with severe fatigue  Strongly recommended admission but she flatly refused and accpeted liability for any roblems that might arise. 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   Follow up 03/15/13 --  Patient returns for a one-week followup. Patient was seen last week with a COPD exacerbation. She was treated with Levaquin and a prednisone taper. Chest x-ray shows COPD changes, and decreased right upper lobe tumor size. Patient has a known history of lung cancer is followed by Dr. Gwenyth Bouillon. She finished her last chemotherapy treatment on September 23. She's completed her radiation therapy. Cycle. CT chest on October 6, showed mild improvement in right upper lobe lung mass and subcarinal adenopathy with no evidence for disease progression. She did have a new small to moderate size pleural effusion.  Patient returns today reporting that she is feeling much improved. She has decreased shortness, of breath and cough. Patient denies any hemoptysis, orthopnea, PND, leg swelling, nausea, vomiting, choking, dysphagia. Patient had been recommended to start oxygen therapy. Due to exertional desaturations. At last visit. However  oxygen order was not completed. Today in the office. Patient's O2 saturations are 90-92% on room air at rest. However, walking, O2 saturation is 85% on room air. Patient has agreed to start on oxygen therapy at home.   ROV 04/09/13 -- COPD, NSCLCA dx RUL s/p chemo. Treated for an AE 1 month ago. Last visit she was improving, Spiriva was substituted for symbicort. She was treated again last week with doxy. Some throat irritation with  spiriva, but tolerating. Some improving cough, little mucous. Scheduled to see Dr Arbutus Ped w CT scan in January.     CAT Score 08/25/2012  Total CAT Score 7    Review of Systems  Constitutional: Negative for fever and unexpected weight change.  HENT: Negative for ear pain, nosebleeds, congestion, sore throat, rhinorrhea, sneezing, trouble swallowing, dental problem, postnasal drip and sinus pressure.   Eyes: Negative for redness and itching.  Respiratory: Positive for cough, no hemoptysis Cardiovascular: Negative for palpitations and leg swelling.  No calf pain .  Gastrointestinal: Negative for nausea and vomiting.  Genitourinary: Negative for dysuria.  Musculoskeletal: Negative for joint swelling.  Skin: Negative for rash.  Neurological: Negative for headaches.  Hematological: Does not bruise/bleed easily.  Psychiatric/Behavioral: Negative for dysphoric mood. The patient is not nervous/anxious.        Objective:   Physical Exam  GEN: A/Ox3; pleasant , NAD, well nourished   HEENT  [place]/AT,  EACs-clear, TMs-wnl, NOSE-clear, THROAT-clear, no lesions, no postnasal drip or exudate noted.   NECK:  Supple w/ fair ROM; no JVD; normal carotid impulses w/o bruits; no thyromegaly or nodules palpated; no lymphadenopathy.  RESP  Clear  P & A; w/o, wheezes/ rales/ or rhonchi.no accessory muscle use  CARD:  RRR, no m/r/g  , no peripheral edema, pulses intact, no cyanosis or clubbing.  Musco: Warm bil, no deformities or joint swelling noted.   Neuro: alert, no focal deficits noted.    Skin: Warm, no lesions or rashes      Assessment & Plan:  COPD  GOLD III She stopped her Symbicort (accidnetally) when Spiriva was started.  - will add the symbicort back x 1 month, see if she benefits from both - rov 1 month  Non-small cell lung cancer - follow up for stability in january

## 2013-04-09 NOTE — Assessment & Plan Note (Signed)
-   follow up for stability in january

## 2013-04-09 NOTE — Patient Instructions (Signed)
Please restart Symbicort 2 puffs twice a day Continue spiriva once a day Wear your oxygen at 2L/min. When flying turn up to 2.5L/min Follow with Dr Delton Coombes in 1 month or next available

## 2013-04-09 NOTE — Assessment & Plan Note (Signed)
She stopped her Symbicort (accidnetally) when Spiriva was started.  - will add the symbicort back x 1 month, see if she benefits from both - rov 1 month

## 2013-04-30 ENCOUNTER — Ambulatory Visit
Admission: RE | Admit: 2013-04-30 | Discharge: 2013-04-30 | Disposition: A | Discharge status: Home / Self Care | Payer: BC Managed Care – PPO | Source: Ambulatory Visit | Attending: Radiation Oncology | Admitting: Radiation Oncology

## 2013-04-30 ENCOUNTER — Encounter: Payer: Self-pay | Admitting: Radiation Oncology

## 2013-04-30 VITALS — BP 135/74 | HR 92 | Temp 98.2°F | Ht 63.0 in | Wt 126.4 lb

## 2013-04-30 DIAGNOSIS — C3491 Malignant neoplasm of unspecified part of right bronchus or lung: Secondary | ICD-10-CM

## 2013-04-30 MED ORDER — OXYCODONE-ACETAMINOPHEN 5-325 MG PO TABS: 2.0000 | ORAL_TABLET | ORAL | Status: DC | PRN

## 2013-04-30 NOTE — Progress Notes (Signed)
Radiation Oncology         [phone] ________________________________  Name: Linda Donovan MRN: [id number]  Date: 04/30/2013  DOB: 06-04-1956  Follow-Up Visit Note  CC: Pcp Not In System  No ref. provider found  Diagnosis:   Stage IIIa non-small cell lung cancer  Interval Since Last Radiation:  5  months  Narrative:  The patient returns today for routine follow-up.  sHe continues to have pain in her right upper back along scapula region. I did review the patient's most recent chest CT scan. There is no obvious rib invasion or scapula involvement in this area. She does have somewhat of a pleural reaction in this area possibly explaining her pain. I did refill the patient's Percocet prescription today.  She did complete her consolidation chemotherapy in mid-September. A post treatment chest CT scan showed mild improvement in the right lung mass and subcarinal adenopathy. There were no new problems noted on chest CT scan. patient denies any hemoptysis or new bony pain. She denies any headaches. 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  she is using oxygen continuously at 2 L at this time.                              ALLERGIES:  is allergic to clinoril and morphine and related.  Meds: Current Outpatient Prescriptions  Medication Sig Dispense Refill  . albuterol (PROVENTIL HFA;VENTOLIN HFA) 108 (90 BASE) MCG/ACT inhaler Inhale 2 puffs into the lungs every 6 (six) hours as needed for wheezing.      Marland Kitchen amphetamine-dextroamphetamine (ADDERALL) 20 MG tablet Take 20 mg by mouth daily before breakfast.       . Ascorbic Acid (VITAMIN C) 1000 MG tablet Take 1,000 mg by mouth daily. Taking emergen-c powder      . budesonide-formoterol (SYMBICORT) 160-4.5 MCG/ACT inhaler Inhale 2 puffs into the lungs 2 (two) times daily.      . calcium-vitamin D (OSCAL WITH D) 250-125 MG-UNIT per tablet Take 1 tablet by mouth daily.      Marland Kitchen ibuprofen (ADVIL,MOTRIN) 200 MG tablet Take 200 mg by mouth  at bedtime as needed for pain.      . magnesium oxide (MAG-OX) 400 MG tablet Take 1 tablet (400 mg total) by mouth daily.  60 tablet  2  . Multiple Vitamins-Minerals (MULTIVITAMIN WITH MINERALS) tablet Take 1 tablet by mouth daily.      Marland Kitchen oxyCODONE-acetaminophen (PERCOCET/ROXICET) 5-325 MG per tablet Take 2 tablets by mouth every 4 (four) hours as needed.  100 tablet  0  . tiotropium (SPIRIVA) 18 MCG inhalation capsule Place 1 capsule (18 mcg total) into inhaler and inhale daily.  30 capsule  6  . traZODone (DESYREL) 50 MG tablet Take 50 mg by mouth at bedtime.      Marland Kitchen zinc gluconate 50 MG tablet Take 50 mg by mouth daily.      Marland Kitchen HYDROcodone-acetaminophen (NORCO/VICODIN) 5-325 MG per tablet 1 tablet every 6 (six) hours as needed.      . loratadine (CLARITIN) 10 MG tablet Take 10 mg by mouth daily as needed.       . mometasone (NASONEX) 50 MCG/ACT nasal spray Place 2 sprays into the nose daily as needed.        No current facility-administered medications for this encounter.    Physical Findings: The patient is in no acute distress. Patient is alert and oriented. Accompanied by her husband on evaluation  today.  height is 5\' 3"  (1.6 m) and weight is 126 lb 6.4 oz (57.335 kg). Her temperature is 98.2 F (36.8 C). Her blood pressure is 135/74 and her pulse is 92. Her oxygen saturation is 91%. .  No palpable cervical supraclavicular or axillary adenopathy. The lungs are clear to auscultation. The heart has regular rhythm and rate. Palpation along the right scapular area reveals some mild tenderness. There is no palpable mass in this area.  Lab Findings: Lab Results  Component Value Date   WBC 7.1 03/12/2013   HGB 11.6 03/12/2013   HCT 35.0 03/12/2013   MCV 101.5* 03/12/2013   PLT 396 03/12/2013      Radiographic Findings: No results found.  Impression: Patient is clinically stable at this time with no active treatment. She will undergo another CAT scan in approximately 3 months since her  previous scan.  Plan:  Routine followup in radiation oncology in March of 2015. She will be seen by medical oncology in January.  _____________________________________  -----------------------------------  Billie Lade, PhD, MD

## 2013-04-30 NOTE — Progress Notes (Signed)
Linda Donovan here with her husband for follow up after treatment to her right lung.  She continues to have pain in her right shoulder blade that she is rating at a 6/10.  She is taking percocet - 1 tablet every 4 hours - usually 3 a day.  She has a cough that she says is improved since radiation ended.  She denies coughing up blood.  She does have shortness of breath with activity - today she is on 2l of oxygen via [place] and her oxygen saturation was 91%.  She has lost 4 lbs since 11/3 and reports a poor appetite.  She reports an improvement in her fatigue.  She has hyperpigmentation in her right upper back.

## 2013-05-16 ENCOUNTER — Encounter: Payer: Self-pay | Admitting: Emergency Medicine

## 2013-05-16 ENCOUNTER — Ambulatory Visit (INDEPENDENT_AMBULATORY_CARE_PROVIDER_SITE_OTHER): Payer: BC Managed Care – PPO | Admitting: Emergency Medicine

## 2013-05-16 VITALS — BP 130/78 | HR 81 | Ht 63.0 in | Wt 129.2 lb

## 2013-05-16 DIAGNOSIS — J441 Chronic obstructive pulmonary disease with (acute) exacerbation: Secondary | ICD-10-CM

## 2013-05-16 MED ORDER — MOMETASONE FUROATE 50 MCG/ACT NA SUSP: 2.0000 | Freq: Every day | NASAL | Status: DC | PRN

## 2013-05-16 MED ORDER — BUDESONIDE-FORMOTEROL FUMARATE 160-4.5 MCG/ACT IN AERO: 2.0000 | INHALATION_SPRAY | Freq: Two times a day (BID) | RESPIRATORY_TRACT | Status: DC

## 2013-05-16 NOTE — Assessment & Plan Note (Signed)
Please continue your Spiriva and Symbicort as you have been taking them  Wear your oxygen  You may decide to do a trial of stopping Spiriva to see if he missed this medication. If you do decide to stop it then assess your breathing status for months. Call our office to let us know if you decide to change the medicines in this way Follow with Dr Liann Spaeth in 4 months or sooner if you have any problems.  

## 2013-05-16 NOTE — Patient Instructions (Signed)
Please continue your Spiriva and Symbicort as you have been taking them  Wear your oxygen  You may decide to do a trial of stopping Spiriva to see if he missed this medication. If you do decide to stop it then assess your breathing status for months. Call our office to let us know if you decide to change the medicines in this way Follow with Dr Delton Coombes in 4 months or sooner if you have any problems.

## 2013-05-16 NOTE — Progress Notes (Signed)
Subjective:    Patient ID: Linda Donovan, female    DOB: Apr 05, 1956, 57 y.o.   MRN: [id number]  HPI Primary Linda Donovan  57 yowf quit smoking 1998 with sob that improved to point were stopped all medications x rare  saba  self referred to pulmonary clinic for hemoptysis.  1st pulmonary eval in EMR era/ [facility]  Sob with inclines and cold weather and steps and no increase in saba indolent onset rattling cough and throat congestion onset mid Jan 2014 with white phlegm streaked with blood and sev times a week esp in am's and no epistaxis. No more blood x 5 days, ? Back of throat.   No obvious pattern to  variabilty or assoc sorethroat cp or chest tightness, subjective wheeze overt sinus or hb symptoms. No unusual exp hx or h/o childhood pna/ asthma or premature birth to his knowledge.   ROV 08/25/12 -- 57 yo former smoker, seen by Dr Sherene Sires for hemoptysis. 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 S/p FOB that was non-diagnostic. She is referred after PET scan to consider another bx. Her hemoptysis has resolved.  She is c/o nasal gtt.   ROV 09/25/12 -- 56 yo former smoker w new dx adenoCA by FOB beginning of April. She is undergoing rx with Dr Arbutus Ped, concurrent chemo + XRT. She is very concerned that her FOB's aren't covered by [employer]. No more hemoptysis.  She has exertional dyspnea.    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 Also stage III lung cancer on chemotherapy  On sept,  23rd 2014 she had second cycle chemotherapy. On the following did she develop shortness of breath and was up and given some prednisone that she completed a few days ago but since then she's had worsening shortness of breath along with increased cough and some change in sputum to green color. She was addressed with emergency room but she refused. So I am seeing acutely. On arrival she was 80% on room air at rest but corrected with 2 L of oxygen. Despite  her severe COPD status prior hypoxemia has not been documented although she insists otherwise and says that hypoxemia stable and unchanged and baseline. He denies any fever, hemoptysis, orthopnea, paroxysmal nocturnal dyspnea edema. Symptoms are associated with severe fatigue  Strongly recommended admission but she flatly refused and accpeted liability for any roblems that might arise. 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   Follow up 03/15/13 --  Patient returns for a one-week followup. Patient was seen last week with a COPD exacerbation. She was treated with Levaquin and a prednisone taper. Chest x-ray shows COPD changes, and decreased right upper lobe tumor size. Patient has a known history of lung cancer is followed by Dr. Gwenyth Bouillon. She finished her last chemotherapy treatment on September 23. She's completed her radiation therapy. Cycle. CT chest on October 6, showed mild improvement in right upper lobe lung mass and subcarinal adenopathy with no evidence for disease progression. She did have a new small to moderate size pleural effusion.  Patient returns today reporting that she is feeling much improved. She has decreased shortness, of breath and cough. Patient denies any hemoptysis, orthopnea, PND, leg swelling, nausea, vomiting, choking, dysphagia. Patient had been recommended to start oxygen therapy. Due to exertional desaturations. At last visit. However  oxygen order was not completed. Today in the office. Patient's O2 saturations are 90-92% on room air at rest. However, walking, O2 saturation is 85% on room air. Patient has agreed to start on oxygen therapy at home.   ROV 04/09/13 -- COPD, NSCLCA dx RUL s/p chemo. Treated for an AE 1 month ago. Last visit she was improving, Spiriva was substituted for symbicort. She was treated again last week with doxy. Some throat irritation with  spiriva, but tolerating. Some improving cough, little mucous. Scheduled to see Dr Arbutus Ped w CT scan in January.   ROV 05/16/13 -- COPD, NSCLCA dx RUL s/p chemo. Last time we added back symbicort to her spiriva. She is now taking both and feels well. Still not 100% sure that she is better with the combination or just due to symbicort. No flares, no new issues.     CAT Score 08/25/2012  Total CAT Score 7    Review of Systems  Constitutional: Negative for fever and unexpected weight change.  HENT: Negative for ear pain, nosebleeds, congestion, sore throat, rhinorrhea, sneezing, trouble swallowing, dental problem, postnasal drip and sinus pressure.   Eyes: Negative for redness and itching.  Respiratory: Positive for cough, no hemoptysis Cardiovascular: Negative for palpitations and leg swelling.  No calf pain .  Gastrointestinal: Negative for nausea and vomiting.  Genitourinary: Negative for dysuria.  Musculoskeletal: Negative for joint swelling.  Skin: Negative for rash.  Neurological: Negative for headaches.  Hematological: Does not bruise/bleed easily.  Psychiatric/Behavioral: Negative for dysphoric mood. The patient is not nervous/anxious.        Objective:   Physical Exam  GEN: A/Ox3; pleasant , NAD, well nourished   HEENT  [place]/AT,  EACs-clear, TMs-wnl, NOSE-clear, THROAT-clear, no lesions, no postnasal drip or exudate noted.   NECK:  Supple w/ fair ROM; no JVD; normal carotid impulses w/o bruits; no thyromegaly or nodules palpated; no lymphadenopathy.  RESP  Clear  P & A; w/o, wheezes/ rales/ or rhonchi. No accessory muscle use  CARD:  RRR, no m/r/g  , no peripheral edema, pulses intact, no cyanosis or clubbing.  Musco: Warm bil, no deformities or joint swelling noted.   Neuro: alert, no focal deficits noted.    Skin: Warm, no lesions or rashes      Assessment & Plan:  COPD  GOLD III Please continue your Spiriva and Symbicort as you have been taking them  Wear your  oxygen  You may decide to do a trial of stopping Spiriva to see if he missed this medication. If you do decide to stop it then assess your breathing status for months. Call our office to let us know if you decide to change the medicines in this way Follow with Dr Delton Coombes in 4 months or sooner if you have any problems.

## 2013-06-12 ENCOUNTER — Other Ambulatory Visit (HOSPITAL_BASED_OUTPATIENT_CLINIC_OR_DEPARTMENT_OTHER): Payer: BC Managed Care – PPO

## 2013-06-12 ENCOUNTER — Ambulatory Visit (HOSPITAL_COMMUNITY)
Admission: RE | Admit: 2013-06-12 | Discharge: 2013-06-12 | Disposition: A | Discharge status: Home / Self Care | Payer: BC Managed Care – PPO | Source: Ambulatory Visit | Attending: Internal Medicine | Admitting: Internal Medicine

## 2013-06-12 ENCOUNTER — Encounter (HOSPITAL_COMMUNITY): Payer: Self-pay

## 2013-06-12 DIAGNOSIS — J438 Other emphysema: Secondary | ICD-10-CM | POA: Insufficient documentation

## 2013-06-12 DIAGNOSIS — M47814 Spondylosis without myelopathy or radiculopathy, thoracic region: Secondary | ICD-10-CM | POA: Insufficient documentation

## 2013-06-12 DIAGNOSIS — R911 Solitary pulmonary nodule: Secondary | ICD-10-CM | POA: Insufficient documentation

## 2013-06-12 DIAGNOSIS — J90 Pleural effusion, not elsewhere classified: Secondary | ICD-10-CM | POA: Insufficient documentation

## 2013-06-12 DIAGNOSIS — C3410 Malignant neoplasm of upper lobe, unspecified bronchus or lung: Secondary | ICD-10-CM | POA: Insufficient documentation

## 2013-06-12 DIAGNOSIS — C3491 Malignant neoplasm of unspecified part of right bronchus or lung: Secondary | ICD-10-CM

## 2013-06-12 DIAGNOSIS — R928 Other abnormal and inconclusive findings on diagnostic imaging of breast: Secondary | ICD-10-CM | POA: Insufficient documentation

## 2013-06-12 DIAGNOSIS — I319 Disease of pericardium, unspecified: Secondary | ICD-10-CM | POA: Insufficient documentation

## 2013-06-12 LAB — CBC WITH DIFFERENTIAL/PLATELET
BASO%: 0.4 % (ref 0.0–2.0)
BASOS ABS: 0 10*3/uL (ref 0.0–0.1)
EOS%: 0.4 % (ref 0.0–7.0)
Eosinophils Absolute: 0 10*3/uL (ref 0.0–0.5)
HEMATOCRIT: 40.8 % (ref 34.8–46.6)
HEMOGLOBIN: 13.7 g/dL (ref 11.6–15.9)
LYMPH%: 12.6 % — AB (ref 14.0–49.7)
MCH: 33 pg (ref 25.1–34.0)
MCHC: 33.6 g/dL (ref 31.5–36.0)
MCV: 98.4 fL (ref 79.5–101.0)
MONO#: 0.8 10*3/uL (ref 0.1–0.9)
MONO%: 9.8 % (ref 0.0–14.0)
NEUT#: 6.3 10*3/uL (ref 1.5–6.5)
NEUT%: 76.8 % (ref 38.4–76.8)
PLATELETS: 305 10*3/uL (ref 145–400)
RBC: 4.15 10*6/uL (ref 3.70–5.45)
RDW: 14.4 % (ref 11.2–14.5)
WBC: 8.2 10*3/uL (ref 3.9–10.3)
lymph#: 1 10*3/uL (ref 0.9–3.3)

## 2013-06-12 LAB — COMPREHENSIVE METABOLIC PANEL (CC13)
ALT: 20 U/L (ref 0–55)
AST: 23 U/L (ref 5–34)
Albumin: 3.8 g/dL (ref 3.5–5.0)
Alkaline Phosphatase: 100 U/L (ref 40–150)
Anion Gap: 9 mEq/L (ref 3–11)
BILIRUBIN TOTAL: 0.45 mg/dL (ref 0.20–1.20)
BUN: 6.2 mg/dL — ABNORMAL LOW (ref 7.0–26.0)
CALCIUM: 9.6 mg/dL (ref 8.4–10.4)
CHLORIDE: 101 meq/L (ref 98–109)
CO2: 28 mEq/L (ref 22–29)
CREATININE: 0.7 mg/dL (ref 0.6–1.1)
Glucose: 132 mg/dl (ref 70–140)
Potassium: 3.8 mEq/L (ref 3.5–5.1)
Sodium: 138 mEq/L (ref 136–145)
Total Protein: 7.7 g/dL (ref 6.4–8.3)

## 2013-06-12 MED ORDER — IOHEXOL 300 MG/ML  SOLN
80.0000 mL | Freq: Once | INTRAMUSCULAR | Status: AC | PRN
  Administered 2013-06-12: 80 mL via INTRAVENOUS

## 2013-06-13 ENCOUNTER — Telehealth: Payer: Self-pay | Admitting: Internal Medicine

## 2013-06-13 ENCOUNTER — Ambulatory Visit (HOSPITAL_BASED_OUTPATIENT_CLINIC_OR_DEPARTMENT_OTHER): Payer: BC Managed Care – PPO | Admitting: Internal Medicine

## 2013-06-13 ENCOUNTER — Telehealth: Payer: Self-pay | Admitting: *Deleted

## 2013-06-13 ENCOUNTER — Other Ambulatory Visit: Payer: BC Managed Care – PPO | Admitting: Lab

## 2013-06-13 ENCOUNTER — Encounter: Payer: Self-pay | Admitting: Internal Medicine

## 2013-06-13 VITALS — BP 145/74 | HR 92 | Temp 98.1°F | Resp 17 | Ht 63.0 in | Wt 129.7 lb

## 2013-06-13 DIAGNOSIS — C3410 Malignant neoplasm of upper lobe, unspecified bronchus or lung: Secondary | ICD-10-CM

## 2013-06-13 DIAGNOSIS — C3491 Malignant neoplasm of unspecified part of right bronchus or lung: Secondary | ICD-10-CM

## 2013-06-13 DIAGNOSIS — J90 Pleural effusion, not elsewhere classified: Secondary | ICD-10-CM

## 2013-06-13 NOTE — Telephone Encounter (Signed)
Pt called and wanted to see if lasix would help with the fluid around her heart.  Dr Vista Mink stated that this would not help and she would need to see a cardiologist if she gets swollen or develops SOB.  She verbalized understanding.  SLJ

## 2013-06-13 NOTE — Patient Instructions (Signed)
Followup visit in 3 months with repeat CT scan of the chest.

## 2013-06-13 NOTE — Telephone Encounter (Signed)
Gave pt appt for lab and MD for April 2015 lab before MD visit

## 2013-06-13 NOTE — Progress Notes (Signed)
[place] [phone]   [phone]  OFFICE PROGRESS NOTE   DIAGNOSIS: Stage IIIa non-small cell lung cancer, adenocarcinoma with negative EGFR mutation and negative ALK gene translocation diagnosed in March of 2014   PRIOR THERAPY:  1) Concurrent chemoradiation with weekly carboplatin for AUC of 2 and paclitaxel 45 mg/M2, status post 8 cycles, last dose was given on 11/13/2012 with partial response.  2) Consolidation chemotherapy with carboplatin for AUC of 5 on day 1 and gemcitabine 1000 mg/M2 on days 1 and 8 every 3 weeks, status post 3 cycles, last dose was given 02/20/2013 with mild improvement in her disease . First cycle was given on 01/02/2013.   CURRENT THERAPY: Observation  CHEMOTHERAPY INTENT: Control  CURRENT # OF CHEMOTHERAPY CYCLES: 0  CURRENT ANTIEMETICS: Zofran, dexamethasone and Compazine  CURRENT SMOKING STATUS: Former smoker, quit in [place]: None  CURRENT BISPHOSPHONATES USE: None  PAIN MANAGEMENT: 8/10, currently Percocet 5/325, 1-2 tabs q 4 hours  NARCOTICS INDUCED CONSTIPATION: None  LIVING WILL AND CODE STATUS: Full code  INTERVAL HISTORY: Linda Donovan 58 y.o. female returns to the clinic today for followup visit accompanied by her daughter. The patient is feeling fine today with no specific complaints except for mild shortness of breath and back pain at the area of radiotherapy. She denied having any nausea or vomiting, no fever or chills. The patient denied having any significant chest pain but continues to have shortness of breath with exertion with no cough or hemoptysis. She has no weight loss or night sweats. She had repeat CT scan of the chest performed recently and she is here for evaluation and discussion of her scan results.  MEDICAL HISTORY: Past Medical History  Diagnosis Date  . COPD (chronic obstructive pulmonary disease)   . ADD (attention deficit disorder)   . Non-small cell  lung cancer 09/18/2012    RUL  . Hx of radiation therapy 09/28/12- 11/17/12    RU lung mass, mediastinum chest, 63 gray 35 fx    ALLERGIES:  is allergic to clinoril and morphine and related.  MEDICATIONS:  Current Outpatient Prescriptions  Medication Sig Dispense Refill  . albuterol (PROVENTIL HFA;VENTOLIN HFA) 108 (90 BASE) MCG/ACT inhaler Inhale 2 puffs into the lungs every 6 (six) hours as needed for wheezing.      Marland Kitchen amphetamine-dextroamphetamine (ADDERALL) 20 MG tablet Take 20 mg by mouth daily before breakfast.       . Ascorbic Acid (VITAMIN C) 1000 MG tablet Take 1,000 mg by mouth daily. Taking emergen-c powder      . budesonide-formoterol (SYMBICORT) 160-4.5 MCG/ACT inhaler Inhale 2 puffs into the lungs 2 (two) times daily.  1 Inhaler  6  . calcium-vitamin D (OSCAL WITH D) 250-125 MG-UNIT per tablet Take 1 tablet by mouth daily.      Marland Kitchen ibuprofen (ADVIL,MOTRIN) 200 MG tablet Take 200 mg by mouth at bedtime as needed for pain.      Marland Kitchen loratadine (CLARITIN) 10 MG tablet Take 10 mg by mouth daily as needed.       . magnesium oxide (MAG-OX) 400 MG tablet Take 1 tablet (400 mg total) by mouth daily.  60 tablet  2  . mometasone (NASONEX) 50 MCG/ACT nasal spray Place 2 sprays into the nose daily as needed.  17 g  6  . Multiple Vitamins-Minerals (MULTIVITAMIN WITH MINERALS) tablet Take 1 tablet by mouth daily.      Marland Kitchen oxyCODONE-acetaminophen (PERCOCET/ROXICET) 5-325  MG per tablet Take 2 tablets by mouth every 4 (four) hours as needed.  100 tablet  0  . tiotropium (SPIRIVA) 18 MCG inhalation capsule Place 1 capsule (18 mcg total) into inhaler and inhale daily.  30 capsule  6  . traZODone (DESYREL) 50 MG tablet Take 50 mg by mouth at bedtime.      Marland Kitchen zinc gluconate 50 MG tablet Take 50 mg by mouth daily.       No current facility-administered medications for this visit.    SURGICAL HISTORY:  Past Surgical History  Procedure Laterality Date  . Total abdominal hysterectomy    . Shoulder  arthroscopy    . Video bronchoscopy Bilateral 07/25/2012    Procedure: VIDEO BRONCHOSCOPY WITH FLUORO;  Surgeon: Tanda Rockers, MD;  Location: [facility];  Service: Endoscopy;  Laterality: Bilateral;  . Lump removed from breasr right  2003  . Endobronchial ultrasound Bilateral 09/04/2012    Procedure: ENDOBRONCHIAL ULTRASOUND;  Surgeon: Collene Gobble, MD;  Location: [facility];  Service: Cardiopulmonary;  Laterality: Bilateral;    REVIEW OF SYSTEMS:  Constitutional: negative Eyes: negative Ears, nose, mouth, throat, and face: negative Respiratory: positive for dyspnea on exertion Cardiovascular: negative Gastrointestinal: negative Genitourinary:negative Integument/breast: negative Hematologic/lymphatic: negative Musculoskeletal:positive for Right shoulder pain Neurological: negative Behavioral/Psych: negative Endocrine: negative Allergic/Immunologic: negative   PHYSICAL EXAMINATION: General appearance: alert, cooperative, fatigued and no distress Head: Normocephalic, without obvious abnormality, atraumatic Neck: no adenopathy, no JVD, supple, symmetrical, trachea midline and thyroid not enlarged, symmetric, no tenderness/mass/nodules Lymph nodes: Cervical, supraclavicular, and axillary nodes normal. Resp: clear to auscultation bilaterally and normal percussion bilaterally Back: symmetric, no curvature. ROM normal. No CVA tenderness. Cardio: regular rate and rhythm, S1, S2 normal, no murmur, click, rub or gallop and normal apical impulse GI: soft, non-tender; bowel sounds normal; no masses,  no organomegaly Extremities: extremities normal, atraumatic, no cyanosis or edema Neurologic: Alert and oriented X 3, normal strength and tone. Normal symmetric reflexes. Normal coordination and gait  ECOG PERFORMANCE STATUS: 1 - Symptomatic but completely ambulatory  There were no vitals taken for this visit.  LABORATORY DATA: Lab Results  Component Value Date   WBC 8.2 06/12/2013    HGB 13.7 06/12/2013   HCT 40.8 06/12/2013   MCV 98.4 06/12/2013   PLT 305 06/12/2013      Chemistry      Component Value Date/Time   NA 138 06/12/2013 1114   NA 135 03/08/2013 1734   K 3.8 06/12/2013 1114   K 4.1 03/08/2013 1734   CL 101 03/08/2013 1734   CL 104 11/13/2012 1122   CO2 28 06/12/2013 1114   CO2 30 03/08/2013 1734   BUN 6.2* 06/12/2013 1114   BUN 8 03/08/2013 1734   CREATININE 0.7 06/12/2013 1114   CREATININE 0.6 03/08/2013 1734      Component Value Date/Time   CALCIUM 9.6 06/12/2013 1114   CALCIUM 8.9 03/08/2013 1734   ALKPHOS 100 06/12/2013 1114   ALKPHOS 90 09/04/2012 0815   AST 23 06/12/2013 1114   AST 22 09/04/2012 0815   ALT 20 06/12/2013 1114   ALT 16 09/04/2012 0815   BILITOT 0.45 06/12/2013 1114   BILITOT 0.6 09/04/2012 0815       RADIOGRAPHIC STUDIES: Ct Chest W Contrast  06/12/2013   CLINICAL DATA:  Lung cancer.  EXAM: CT CHEST WITH CONTRAST  TECHNIQUE: Multidetector CT imaging of the chest was performed during intravenous contrast administration.  CONTRAST:  53mL OMNIPAQUE IOHEXOL 300 MG/ML  SOLN  COMPARISON:  03/12/2013  FINDINGS: Small right pleural effusion noted. This is improved from previous exam. Advanced changes of centrilobular and paraseptal emphysema noted. Right upper lobe lung mass measures 5.4 x 2.8 cm, image 17/series 2. This is unchanged from the previous exam. Stable subpleural nodule in the anterior right lower lobe measuring 5 mm, image 40/series 5.  The trachea appears patent and is midline. The heart size is normal. Moderate pericardial effusion is present and appears increased in volume from previous exam. Low-attenuation sub- carinal lymph node is identified. This measures 2 cm, image 25/series 2. Previously 2.2 cm. No new or progressive mediastinal or hilar adenopathy identified.  No enlarged axillary or supraclavicular lymph nodes. Multiple calcifications are identified within both breasts.  Incidental imaging through the upper abdomen shows no acute findings. The  adrenal glands both appear normal.  Review of the visualized osseous structures is significant for mild thoracic spondylosis. No aggressive lytic or sclerotic bone lesions.  IMPRESSION: 1. No acute findings. 2. Stable right upper lobe low-attenuation lung mass and fluid attenuating sub- carinal lymph node. 3. Increase in size of pericardial effusion. 4. Decrease in volume of right effusion.   Electronically Signed   By: Kerby Moors M.D.   On: 06/12/2013 14:30    ASSESSMENT AND PLAN: This is a very pleasant 58 years old white female with stage IIIa non-small cell lung cancer, adenocarcinoma with negative EGFR mutation and negative ALK gene translocation is status post concurrent chemoradiation with weekly carboplatin and paclitaxel followed by consolidation chemotherapy with 3 cycles of carboplatin and gemcitabine. Her recent scan showed no evidence for disease progression except for mild increase in the size of pericardial effusion. I discussed the scan results and showed the images to the patient and her daughter. I recommended for her to continue on observation for now with repeat CT scan of the chest in 3 months. She was advised to call immediately if she has any significant dyspnea or swelling of her lower extremities. She may need referral to a cardiologist at some point for consideration of 2-D echo and evaluation of the pericardial effusion. The patient was advised to call immediately if she has any concerning symptoms in the interval.  The patient voices understanding of current disease status and treatment options and is in agreement with the current care plan.  All questions were answered. The patient knows to call the clinic with any problems, questions or concerns. We can certainly see the patient much sooner if necessary.

## 2013-08-10 ENCOUNTER — Telehealth: Payer: Self-pay | Admitting: Emergency Medicine

## 2013-08-10 MED ORDER — AZITHROMYCIN 250 MG PO TABS: ORAL_TABLET | ORAL | Status: DC

## 2013-08-10 MED ORDER — PREDNISONE 10 MG PO TABS: ORAL_TABLET | ORAL | Status: DC

## 2013-08-10 NOTE — Telephone Encounter (Signed)
Pt advised we have sent the message to Dr. Lamonte Sakai to give recs and we will not leave today without calling her with recs. Pt states understanding. Gut[place]berg Bing, CMA

## 2013-08-10 NOTE — Telephone Encounter (Signed)
Please call in z-pack and pred > Take 40mg  daily for 3 days, then 30mg  daily for 3 days, then 20mg  daily for 3 days, then 10mg  daily for 3 days, then stop. Also have her call us next week to let us know how she is

## 2013-08-10 NOTE — Telephone Encounter (Signed)
Pt is requesting an update.  Linda Donovan

## 2013-08-10 NOTE — Telephone Encounter (Signed)
Spoke with pt. Complains of cough, chest tightness and rib pain x4 days. Has been using Mucinex, Delsym and Robitussin with minimal relief. Would like an antibiotic and cough medication sent in.  RB - please advise. Thanks.

## 2013-08-10 NOTE — Telephone Encounter (Signed)
Advised pt that rx's at pharm for pick up per RB nothing further needed

## 2013-08-16 ENCOUNTER — Telehealth: Payer: Self-pay | Admitting: Emergency Medicine

## 2013-08-16 MED ORDER — BUDESONIDE-FORMOTEROL FUMARATE 160-4.5 MCG/ACT IN AERO: 2.0000 | INHALATION_SPRAY | Freq: Two times a day (BID) | RESPIRATORY_TRACT | Status: DC

## 2013-08-16 NOTE — Telephone Encounter (Signed)
Pt aware samples left for pick up

## 2013-09-03 ENCOUNTER — Telehealth: Payer: Self-pay | Admitting: Emergency Medicine

## 2013-09-03 NOTE — Telephone Encounter (Signed)
Called and spoke with pt. She scheduled appt to see TP tomorrow for acute appt.

## 2013-09-04 ENCOUNTER — Encounter: Payer: Self-pay | Admitting: Adult Health

## 2013-09-04 ENCOUNTER — Ambulatory Visit (INDEPENDENT_AMBULATORY_CARE_PROVIDER_SITE_OTHER): Payer: BC Managed Care – PPO | Admitting: Adult Health

## 2013-09-04 VITALS — BP 144/84 | HR 100 | Temp 97.1°F | Ht 63.0 in | Wt 131.0 lb

## 2013-09-04 DIAGNOSIS — J441 Chronic obstructive pulmonary disease with (acute) exacerbation: Secondary | ICD-10-CM

## 2013-09-04 MED ORDER — HYDROCODONE-HOMATROPINE 5-1.5 MG/5ML PO SYRP: 5.0000 mL | ORAL_SOLUTION | Freq: Four times a day (QID) | ORAL | Status: DC | PRN

## 2013-09-04 MED ORDER — PREDNISONE 10 MG PO TABS: ORAL_TABLET | ORAL | Status: DC

## 2013-09-04 MED ORDER — ALBUTEROL SULFATE HFA 108 (90 BASE) MCG/ACT IN AERS: 2.0000 | INHALATION_SPRAY | Freq: Four times a day (QID) | RESPIRATORY_TRACT | Status: DC | PRN

## 2013-09-04 MED ORDER — LEVOFLOXACIN 500 MG PO TABS: 500.0000 mg | ORAL_TABLET | Freq: Every day | ORAL | Status: DC

## 2013-09-04 MED ORDER — LEVALBUTEROL HCL 0.63 MG/3ML IN NEBU
0.6300 mg | INHALATION_SOLUTION | Freq: Once | RESPIRATORY_TRACT | Status: AC
  Administered 2013-09-04: 0.63 mg via RESPIRATORY_TRACT

## 2013-09-04 NOTE — Progress Notes (Signed)
Subjective:    Patient ID: Linda Donovan, female    DOB: 01-01-1956, 58 y.o.   MRN: [id number]  HPI Primary Linda Donovan  70 yowf quit smoking 1998 with sob that improved to point were stopped all medications x rare  saba  self referred to pulmonary clinic for hemoptysis.  1st pulmonary eval in EMR era/ [facility]  Sob with inclines and cold weather and steps and no increase in saba indolent onset rattling cough and throat congestion onset mid Jan 2014 with white phlegm streaked with blood and sev times a week esp in am's and no epistaxis. No more blood x 5 days, ? Back of throat.   No obvious pattern to  variabilty or assoc sorethroat cp or chest tightness, subjective wheeze overt sinus or hb symptoms. No unusual exp hx or h/o childhood pna/ asthma or premature birth to his knowledge.   ROV 08/25/12 -- 58 yo former smoker, seen by Dr Melvyn Novas for hemoptysis. 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 S/p FOB that was non-diagnostic. She is referred after PET scan to consider another bx. Her hemoptysis has resolved.  She is c/o nasal gtt.   ROV 09/25/12 -- 58 yo former smoker w new dx adenoCA by FOB beginning of April. She is undergoing rx with Dr Julien Nordmann, concurrent chemo + XRT. She is very concerned that her FOB's aren't covered by [employer]. No more hemoptysis.  She has exertional dyspnea.    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 Also stage III lung cancer on chemotherapy  On sept,  23rd 2014 she had second cycle chemotherapy. On the following did she develop shortness of breath and was up and given some prednisone that she completed a few days ago but since then she's had worsening shortness of breath along with increased cough and some change in sputum to green color. She was addressed with emergency room but she refused. So I am seeing acutely. On arrival she was 80% on room air at rest but corrected with 2 L of oxygen. Despite  her severe COPD status prior hypoxemia has not been documented although she insists otherwise and says that hypoxemia stable and unchanged and baseline. He denies any fever, hemoptysis, orthopnea, paroxysmal nocturnal dyspnea edema. Symptoms are associated with severe fatigue  Strongly recommended admission but she flatly refused and accpeted liability for any roblems that might arise. 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   Follow up 03/15/13 --  Patient returns for a one-week followup. Patient was seen last week with a COPD exacerbation. She was treated with Levaquin and a prednisone taper. Chest x-ray shows COPD changes, and decreased right upper lobe tumor size. Patient has a known history of lung cancer is followed by Dr. Inda Merlin. She finished her last chemotherapy treatment on September 23. She's completed her radiation therapy. Cycle. CT chest on October 6, showed mild improvement in right upper lobe lung mass and subcarinal adenopathy with no evidence for disease progression. She did have a new small to moderate size pleural effusion.  Patient returns today reporting that she is feeling much improved. She has decreased shortness, of breath and cough. Patient denies any hemoptysis, orthopnea, PND, leg swelling, nausea, vomiting, choking, dysphagia. Patient had been recommended to start oxygen therapy. Due to exertional desaturations. At last visit.  However oxygen order was not completed. Today in the office. Patient's O2 saturations are 90-92% on room air at rest. However, walking, O2 saturation is 85% on room air. Patient has agreed to start on oxygen therapy at home.   ROV 04/09/13 -- COPD, NSCLCA dx RUL s/p chemo. Treated for an AE 1 month ago. Last visit she was improving, Spiriva was substituted for symbicort. She was treated again last week with doxy. Some throat irritation with  spiriva, but tolerating. Some improving cough, little mucous. Scheduled to see Dr Julien Nordmann w CT scan in January.   ROV 05/16/13 -- COPD, NSCLCA dx RUL s/p chemo. Last time we added back symbicort to her spiriva. She is now taking both and feels well. Still not 100% sure that she is better with the combination or just due to symbicort. No flares, no new issues.      09/04/2013 Acute OV  Complains of  prod cough with increased SOB, wheezing, prod cough with clear-to-yellow mucus, chest tightness x5-6weeks.  Was treated with zpak and pred taper -got better for little while. Cough and wheezing is worse over last week.  Denies f/c/s, hemoptysis, nausea, vomiting Cough is keeping her up at night.  Has CT chest later this week .     Review of Systems .Constitutional:   No  weight loss, night sweats,  Fevers, chills, + fatigue, or  lassitude.  HEENT:   No headaches,  Difficulty swallowing,  Tooth/dental problems, or  Sore throat,                No sneezing, itching, ear ache,  +nasal congestion, post nasal drip,   CV:  No chest pain,  Orthopnea, PND, swelling in lower extremities, anasarca, dizziness, palpitations, syncope.   GI  No heartburn, indigestion, abdominal pain, nausea, vomiting, diarrhea, change in bowel habits, loss of appetite, bloody stools.   Resp:    No chest wall deformity  Skin: no rash or lesions.  GU: no dysuria, change in color of urine, no urgency or frequency.  No flank pain, no hematuria   MS:  No joint pain or swelling.  No decreased range of motion.  No back pain.  Psych:  No change in mood or affect. No depression or anxiety.  No memory loss.         Objective:   Physical Exam GEN: A/Ox3; pleasant , NAD   HEENT:  [place]/AT,  EACs-clear, TMs-wnl, NOSE-clear, THROAT-clear, no lesions, no postnasal drip or exudate noted.   NECK:  Supple w/ fair ROM; no JVD; normal carotid impulses w/o bruits; no thyromegaly or nodules palpated; no lymphadenopathy.  RESP  Exp  wheezing , no accessory muscle use, no dullness to percussion  CARD:  RRR, no m/r/g  , no peripheral edema, pulses intact, no cyanosis or clubbing.  GI:   Soft & nt; nml bowel sounds; no organomegaly or masses detected.  Musco: Warm bil, no deformities or joint swelling noted.   Neuro: alert, no focal deficits noted.    Skin: Warm, no lesions or rashes         Assessment & Plan:

## 2013-09-04 NOTE — Addendum Note (Signed)
Addended by: Maurice March on: 09/04/2013 05:15 PM   Modules accepted: Orders

## 2013-09-04 NOTE — Patient Instructions (Signed)
Levaquin 500mg  daily for 7 days  Mucinex DM Twice daily  As needed  Cough/congestion  Prednisone taper over next week.  Follow up for CT scan this week as planned  Please contact office for sooner follow up if symptoms do not improve or worsen or seek emergency care  Follow up Dr. Lamonte Sakai  As planned and As needed

## 2013-09-04 NOTE — Assessment & Plan Note (Signed)
Flare   Plan  Levaquin 500mg  daily for 7 days  Mucinex DM Twice daily  As needed  Cough/congestion  Prednisone taper over next week.  Follow up for CT scan this week as planned  Please contact office for sooner follow up if symptoms do not improve or worsen or seek emergency care  Follow up Dr. Lamonte Sakai  As planned and As needed

## 2013-09-07 ENCOUNTER — Ambulatory Visit (HOSPITAL_COMMUNITY)
Admission: RE | Admit: 2013-09-07 | Discharge: 2013-09-07 | Disposition: A | Discharge status: Home / Self Care | Payer: BC Managed Care – PPO | Source: Ambulatory Visit | Attending: Internal Medicine | Admitting: Internal Medicine

## 2013-09-07 ENCOUNTER — Other Ambulatory Visit (HOSPITAL_BASED_OUTPATIENT_CLINIC_OR_DEPARTMENT_OTHER): Payer: BC Managed Care – PPO

## 2013-09-07 DIAGNOSIS — C3491 Malignant neoplasm of unspecified part of right bronchus or lung: Secondary | ICD-10-CM

## 2013-09-07 DIAGNOSIS — J438 Other emphysema: Secondary | ICD-10-CM | POA: Insufficient documentation

## 2013-09-07 DIAGNOSIS — T66XXXA Radiation sickness, unspecified, initial encounter: Secondary | ICD-10-CM | POA: Insufficient documentation

## 2013-09-07 DIAGNOSIS — Y842 Radiological procedure and radiotherapy as the cause of abnormal reaction of the patient, or of later complication, without mention of misadventure at the time of the procedure: Secondary | ICD-10-CM | POA: Insufficient documentation

## 2013-09-07 DIAGNOSIS — Z923 Personal history of irradiation: Secondary | ICD-10-CM | POA: Insufficient documentation

## 2013-09-07 DIAGNOSIS — C3490 Malignant neoplasm of unspecified part of unspecified bronchus or lung: Secondary | ICD-10-CM | POA: Insufficient documentation

## 2013-09-07 DIAGNOSIS — Z9221 Personal history of antineoplastic chemotherapy: Secondary | ICD-10-CM | POA: Insufficient documentation

## 2013-09-07 DIAGNOSIS — I319 Disease of pericardium, unspecified: Secondary | ICD-10-CM | POA: Insufficient documentation

## 2013-09-07 DIAGNOSIS — C3410 Malignant neoplasm of upper lobe, unspecified bronchus or lung: Secondary | ICD-10-CM

## 2013-09-07 DIAGNOSIS — R0602 Shortness of breath: Secondary | ICD-10-CM | POA: Insufficient documentation

## 2013-09-07 LAB — CBC WITH DIFFERENTIAL/PLATELET
BASO%: 0.4 % (ref 0.0–2.0)
Basophils Absolute: 0 10*3/uL (ref 0.0–0.1)
EOS ABS: 0.1 10*3/uL (ref 0.0–0.5)
EOS%: 1 % (ref 0.0–7.0)
HEMATOCRIT: 39.1 % (ref 34.8–46.6)
HGB: 13.2 g/dL (ref 11.6–15.9)
LYMPH%: 14.3 % (ref 14.0–49.7)
MCH: 35 pg — ABNORMAL HIGH (ref 25.1–34.0)
MCHC: 33.8 g/dL (ref 31.5–36.0)
MCV: 103.5 fL — ABNORMAL HIGH (ref 79.5–101.0)
MONO#: 1 10*3/uL — AB (ref 0.1–0.9)
MONO%: 11.5 % (ref 0.0–14.0)
NEUT%: 72.8 % (ref 38.4–76.8)
NEUTROS ABS: 6.2 10*3/uL (ref 1.5–6.5)
Platelets: 370 10*3/uL (ref 145–400)
RBC: 3.78 10*6/uL (ref 3.70–5.45)
RDW: 12.7 % (ref 11.2–14.5)
WBC: 8.5 10*3/uL (ref 3.9–10.3)
lymph#: 1.2 10*3/uL (ref 0.9–3.3)

## 2013-09-07 LAB — COMPREHENSIVE METABOLIC PANEL (CC13)
ALBUMIN: 3.3 g/dL — AB (ref 3.5–5.0)
ALT: 13 U/L (ref 0–55)
AST: 19 U/L (ref 5–34)
Alkaline Phosphatase: 105 U/L (ref 40–150)
Anion Gap: 10 mEq/L (ref 3–11)
BUN: 6.2 mg/dL — ABNORMAL LOW (ref 7.0–26.0)
CO2: 27 mEq/L (ref 22–29)
CREATININE: 0.7 mg/dL (ref 0.6–1.1)
Calcium: 9.5 mg/dL (ref 8.4–10.4)
Chloride: 103 mEq/L (ref 98–109)
Glucose: 95 mg/dl (ref 70–140)
POTASSIUM: 3.5 meq/L (ref 3.5–5.1)
Sodium: 140 mEq/L (ref 136–145)
Total Bilirubin: 0.29 mg/dL (ref 0.20–1.20)
Total Protein: 6.9 g/dL (ref 6.4–8.3)

## 2013-09-07 MED ORDER — IOHEXOL 300 MG/ML  SOLN
80.0000 mL | Freq: Once | INTRAMUSCULAR | Status: AC | PRN
Start: 2013-09-07 — End: 2013-09-07
  Administered 2013-09-07: 80 mL via INTRAVENOUS

## 2013-09-11 ENCOUNTER — Telehealth: Payer: Self-pay | Admitting: Internal Medicine

## 2013-09-11 ENCOUNTER — Ambulatory Visit (HOSPITAL_BASED_OUTPATIENT_CLINIC_OR_DEPARTMENT_OTHER): Payer: BC Managed Care – PPO | Admitting: Internal Medicine

## 2013-09-11 ENCOUNTER — Encounter: Payer: Self-pay | Admitting: Internal Medicine

## 2013-09-11 VITALS — BP 129/73 | HR 133 | Temp 98.5°F | Resp 18 | Ht 63.0 in | Wt 132.4 lb

## 2013-09-11 DIAGNOSIS — C3490 Malignant neoplasm of unspecified part of unspecified bronchus or lung: Secondary | ICD-10-CM

## 2013-09-11 DIAGNOSIS — C3410 Malignant neoplasm of upper lobe, unspecified bronchus or lung: Secondary | ICD-10-CM

## 2013-09-11 NOTE — Telephone Encounter (Signed)
Gave pt appt for july 2015 lab before Ct then MD after a few days

## 2013-09-11 NOTE — Progress Notes (Signed)
[place] [phone]   Fax:(336) [phone]  OFFICE PROGRESS NOTE   DIAGNOSIS: Stage IIIA non-small cell lung cancer, adenocarcinoma with negative EGFR mutation and negative ALK gene translocation diagnosed in March of 2014   PRIOR THERAPY:  1) Concurrent chemoradiation with weekly carboplatin for AUC of 2 and paclitaxel 45 mg/M2, status post 8 cycles, last dose was given on 11/13/2012 with partial response.  2) Consolidation chemotherapy with carboplatin for AUC of 5 on day 1 and gemcitabine 1000 mg/M2 on days 1 and 8 every 3 weeks, status post 3 cycles, last dose was given 02/20/2013 with mild improvement in her disease . First cycle was given on 01/02/2013.   CURRENT THERAPY: Observation  CHEMOTHERAPY INTENT: Control  CURRENT # OF CHEMOTHERAPY CYCLES: 0  CURRENT ANTIEMETICS: Zofran, dexamethasone and Compazine  CURRENT SMOKING STATUS: Former smoker, quit in [place]: None  CURRENT BISPHOSPHONATES USE: None  PAIN MANAGEMENT: 8/10, currently Percocet 5/325, 1-2 tabs q 4 hours  NARCOTICS INDUCED CONSTIPATION: None  LIVING WILL AND CODE STATUS: Full code  INTERVAL HISTORY: Linda Donovan 58 y.o. female returns to the clinic today for followup visit accompanied by her husband. She has been suffering from cold symptoms were almost 7 weeks with chest congestion and cough. She was seen by Dr. Lamonte Sakai and was started on 2 courses of prednisone with minimal improvement. She denied having any nausea or vomiting, no fever or chills. The patient denied having any significant chest pain but continues to have shortness of breath with exertion with dry cough but no hemoptysis. She has no weight loss or night sweats. She had repeat CT scan of the chest performed recently and she is here for evaluation and discussion of her scan results.  MEDICAL HISTORY: Past Medical History  Diagnosis Date  . COPD (chronic obstructive pulmonary disease)   .  ADD (attention deficit disorder)   . Non-small cell lung cancer 09/18/2012    RUL  . Hx of radiation therapy 09/28/12- 11/17/12    RU lung mass, mediastinum chest, 63 gray 35 fx    ALLERGIES:  is allergic to clinoril and morphine and related.  MEDICATIONS:  Current Outpatient Prescriptions  Medication Sig Dispense Refill  . albuterol (PROVENTIL HFA;VENTOLIN HFA) 108 (90 BASE) MCG/ACT inhaler Inhale 2 puffs into the lungs every 6 (six) hours as needed for wheezing.  8.5 g  5  . amphetamine-dextroamphetamine (ADDERALL) 20 MG tablet Take 20 mg by mouth daily before breakfast.       . Ascorbic Acid (VITAMIN C) 1000 MG tablet Take 1,000 mg by mouth daily. Taking emergen-c powder      . aspirin 81 MG tablet Take 81 mg by mouth daily.      . budesonide-formoterol (SYMBICORT) 160-4.5 MCG/ACT inhaler Inhale 2 puffs into the lungs 2 (two) times daily.  2 Inhaler  0  . calcium-vitamin D (OSCAL WITH D) 250-125 MG-UNIT per tablet Take 1 tablet by mouth daily.      Marland Kitchen HYDROcodone-homatropine (HYDROMET) 5-1.5 MG/5ML syrup Take 5 mLs by mouth every 6 (six) hours as needed for cough.  240 mL  0  . ibuprofen (ADVIL,MOTRIN) 200 MG tablet Take 200 mg by mouth at bedtime as needed for pain.      Marland Kitchen levofloxacin (LEVAQUIN) 500 MG tablet Take 1 tablet (500 mg total) by mouth daily.  7 tablet  0  . loratadine (CLARITIN) 10 MG tablet Take 10 mg by mouth daily as  needed.       . magnesium oxide (MAG-OX) 400 MG tablet Take 1 tablet (400 mg total) by mouth daily.  60 tablet  2  . mometasone (NASONEX) 50 MCG/ACT nasal spray Place 2 sprays into the nose daily as needed.  17 g  6  . Multiple Vitamins-Minerals (MULTIVITAMIN WITH MINERALS) tablet Take 1 tablet by mouth daily.      Marland Kitchen oxyCODONE-acetaminophen (PERCOCET/ROXICET) 5-325 MG per tablet Take 2 tablets by mouth every 4 (four) hours as needed.  100 tablet  0  . predniSONE (DELTASONE) 10 MG tablet 4 tabs for 2 days, then 3 tabs for 2 days, 2 tabs for 2 days, then 1 tab for  2 days, then stop  20 tablet  0  . tiotropium (SPIRIVA) 18 MCG inhalation capsule Place 1 capsule (18 mcg total) into inhaler and inhale daily.  30 capsule  6  . traZODone (DESYREL) 50 MG tablet Take 50 mg by mouth at bedtime.      Marland Kitchen zinc gluconate 50 MG tablet Take 50 mg by mouth daily.       No current facility-administered medications for this visit.    SURGICAL HISTORY:  Past Surgical History  Procedure Laterality Date  . Total abdominal hysterectomy    . Shoulder arthroscopy    . Video bronchoscopy Bilateral 07/25/2012    Procedure: VIDEO BRONCHOSCOPY WITH FLUORO;  Surgeon: Tanda Rockers, MD;  Location: [facility];  Service: Endoscopy;  Laterality: Bilateral;  . Lump removed from breasr right  2003  . Endobronchial ultrasound Bilateral 09/04/2012    Procedure: ENDOBRONCHIAL ULTRASOUND;  Surgeon: Collene Gobble, MD;  Location: [facility];  Service: Cardiopulmonary;  Laterality: Bilateral;    REVIEW OF SYSTEMS:  Constitutional: negative Eyes: negative Ears, nose, mouth, throat, and face: negative Respiratory: positive for dyspnea on exertion Cardiovascular: negative Gastrointestinal: negative Genitourinary:negative Integument/breast: negative Hematologic/lymphatic: negative Musculoskeletal:positive for Right shoulder pain Neurological: negative Behavioral/Psych: negative Endocrine: negative Allergic/Immunologic: negative   PHYSICAL EXAMINATION: General appearance: alert, cooperative, fatigued and no distress Head: Normocephalic, without obvious abnormality, atraumatic Neck: no adenopathy, no JVD, supple, symmetrical, trachea midline and thyroid not enlarged, symmetric, no tenderness/mass/nodules Lymph nodes: Cervical, supraclavicular, and axillary nodes normal. Resp: clear to auscultation bilaterally and normal percussion bilaterally Back: symmetric, no curvature. ROM normal. No CVA tenderness. Cardio: regular rate and rhythm, S1, S2 normal, no murmur, click, rub or  gallop and normal apical impulse GI: soft, non-tender; bowel sounds normal; no masses,  no organomegaly Extremities: extremities normal, atraumatic, no cyanosis or edema Neurologic: Alert and oriented X 3, normal strength and tone. Normal symmetric reflexes. Normal coordination and gait  ECOG PERFORMANCE STATUS: 1 - Symptomatic but completely ambulatory  There were no vitals taken for this visit.  LABORATORY DATA: Lab Results  Component Value Date   WBC 8.5 09/07/2013   HGB 13.2 09/07/2013   HCT 39.1 09/07/2013   MCV 103.5* 09/07/2013   PLT 370 09/07/2013      Chemistry      Component Value Date/Time   NA 140 09/07/2013 0802   NA 135 03/08/2013 1734   K 3.5 09/07/2013 0802   K 4.1 03/08/2013 1734   CL 101 03/08/2013 1734   CL 104 11/13/2012 1122   CO2 27 09/07/2013 0802   CO2 30 03/08/2013 1734   BUN 6.2* 09/07/2013 0802   BUN 8 03/08/2013 1734   CREATININE 0.7 09/07/2013 0802   CREATININE 0.6 03/08/2013 1734      Component Value Date/Time  CALCIUM 9.5 09/07/2013 0802   CALCIUM 8.9 03/08/2013 1734   ALKPHOS 105 09/07/2013 0802   ALKPHOS 90 09/04/2012 0815   AST 19 09/07/2013 0802   AST 22 09/04/2012 0815   ALT 13 09/07/2013 0802   ALT 16 09/04/2012 0815   BILITOT 0.29 09/07/2013 0802   BILITOT 0.6 09/04/2012 0815       RADIOGRAPHIC STUDIES: Ct Chest W Contrast  09/07/2013   CLINICAL DATA:  Lung cancer diagnosed 2014, chemotherapy/ XRT complete, shortness of breath  EXAM: CT CHEST WITH CONTRAST  TECHNIQUE: Multidetector CT imaging of the chest was performed during intravenous contrast administration.  CONTRAST:  2mL OMNIPAQUE IOHEXOL 300 MG/ML  SOLN  COMPARISON:  06/12/2013  FINDINGS: 5.5 x 2.6 cm posterior right upper lobe necrotic mass (series 2/image 19), corresponding to known primary bronchogenic neoplasm, unchanged.  New/increasing radiation changes posteriorly, in the superior segment right lower lobe (series 5/ image 24).  Underlying severe centrilobular and paraseptal emphysematous changes with  bullous changes in the left lung apex.  Trace right pleural effusion.  No pneumothorax.  The heart is normal in size. Small pericardial effusion, grossly unchanged. Atherosclerotic calcifications of the aortic arch.  2.0 cm low-density subcarinal node (series 2/ image 28), unchanged. 8 mm short axis right hilar/infrahilar node (series 2/image 28). No suspicious axillary lymphadenopathy.  Visualized upper abdomen is unremarkable.  Mild degenerative changes of the visualized thoracolumbar spine.  IMPRESSION: Stable 5.5 cm posterior right upper lobe mass, corresponding to known primary bronchogenic neoplasm.  Associated subcarinal and right infrahilar nodes, grossly unchanged.  Radiation changes in the superior segment right lower lobe.   Electronically Signed   By: Julian Hy M.D.   On: 09/07/2013 09:11    ASSESSMENT AND PLAN: This is a very pleasant 58 years old white female with stage IIIa non-small cell lung cancer, adenocarcinoma with negative EGFR mutation and negative ALK gene translocation is status post concurrent chemoradiation with weekly carboplatin and paclitaxel followed by consolidation chemotherapy with 3 cycles of carboplatin and gemcitabine. Her recent scan showed no evidence for disease progression. I discussed the scan results and showed the images to the patient and her husband. I recommended for her to continue on observation for now with repeat CT scan of the chest in 3 months. She was advised to call immediately if she has any significant dyspnea or swelling of her lower extremities.  The patient was advised to call immediately if she has any concerning symptoms in the interval.  The patient voices understanding of current disease status and treatment options and is in agreement with the current care plan.  All questions were answered. The patient knows to call the clinic with any problems, questions or concerns. We can certainly see the patient much sooner if  necessary.  Disclaimer: This note was dictated with voice recognition software. Similar sounding words can inadvertently be transcribed and may not be corrected upon review.

## 2013-09-14 ENCOUNTER — Telehealth: Payer: Self-pay | Admitting: Emergency Medicine

## 2013-09-14 MED ORDER — OMEPRAZOLE 20 MG PO CPDR: 20.0000 mg | DELAYED_RELEASE_CAPSULE | Freq: Every day | ORAL | Status: DC

## 2013-09-14 NOTE — Telephone Encounter (Signed)
We will restart spiriva, start taking nasonex every day, start omeprazole bid . She will call in 10 days to report

## 2013-09-14 NOTE — Telephone Encounter (Signed)
Last OV 09-04-13. I spoke with the pt and she states she has finished Levaquin and prednisone prescribed by TP at last visit. Pt states she is still having a lot of chest congestion. She states she feels better except for the chest congestion. She states she also has a cough that is mostly dry but when it is productive phlegm is clear. Pt denies any chest tightness or SOB. She states she has some wheezing but this is better then it was. Pt states she has been taking mucinex. She states this has been going on x 7 weeks and she is tired of feeling this way. I offered pt an appt this PM with RB but pt refused due to work obligations. Pt wanted message sent to Dr. Lamonte Sakai. Please advise. Brenas Bing, CMA Allergies  Allergen Reactions  . Clinoril [Sulindac] Hives  . Morphine And Related     itch

## 2013-09-26 ENCOUNTER — Encounter: Payer: Self-pay | Admitting: Internal Medicine

## 2013-09-26 NOTE — Progress Notes (Signed)
Pt called wanting assistance with her bills.  Pt has insurance so she doesn't qualify for financial assistance thru the hospital.  I suggested applying for the Access [employer] card.  She said she already has it.  I informed her that she can get all of her balances placed on the card and she can make 1 payment.  She said she didn't know anything about that so that's what's she's going to do.

## 2013-10-26 ENCOUNTER — Telehealth: Payer: Self-pay | Admitting: Emergency Medicine

## 2013-10-26 MED ORDER — BUDESONIDE-FORMOTEROL FUMARATE 160-4.5 MCG/ACT IN AERO: 2.0000 | INHALATION_SPRAY | Freq: Two times a day (BID) | RESPIRATORY_TRACT | Status: DC

## 2013-10-26 NOTE — Telephone Encounter (Signed)
I called spoke with pt. She reports she is completely out of symbicort and she can't afford RX. We have been supplying her in samples.  She doesn't qualify for patient assistance bc she has insurance. She has a $5500 deductible she has not met yet.  SAMPLE LEFT FOR PT TO PICK UP. Nothing further needed

## 2013-11-13 ENCOUNTER — Telehealth: Payer: Self-pay | Admitting: Emergency Medicine

## 2013-11-13 MED ORDER — BUDESONIDE-FORMOTEROL FUMARATE 160-4.5 MCG/ACT IN AERO: 2.0000 | INHALATION_SPRAY | Freq: Two times a day (BID) | RESPIRATORY_TRACT | Status: DC

## 2013-11-13 NOTE — Telephone Encounter (Signed)
Called maed pt aware 1 sample will be left for pick up. Nothing further needed

## 2013-12-05 ENCOUNTER — Ambulatory Visit (HOSPITAL_COMMUNITY)
Admission: RE | Admit: 2013-12-05 | Discharge: 2013-12-05 | Disposition: A | Discharge status: Home / Self Care | Payer: BC Managed Care – PPO | Source: Ambulatory Visit | Attending: Internal Medicine | Admitting: Internal Medicine

## 2013-12-05 ENCOUNTER — Other Ambulatory Visit (HOSPITAL_BASED_OUTPATIENT_CLINIC_OR_DEPARTMENT_OTHER): Payer: BC Managed Care – PPO

## 2013-12-05 ENCOUNTER — Encounter (HOSPITAL_COMMUNITY): Payer: Self-pay

## 2013-12-05 ENCOUNTER — Ambulatory Visit (HOSPITAL_COMMUNITY): Payer: BC Managed Care – PPO

## 2013-12-05 DIAGNOSIS — R599 Enlarged lymph nodes, unspecified: Secondary | ICD-10-CM | POA: Insufficient documentation

## 2013-12-05 DIAGNOSIS — I319 Disease of pericardium, unspecified: Secondary | ICD-10-CM | POA: Insufficient documentation

## 2013-12-05 DIAGNOSIS — C3410 Malignant neoplasm of upper lobe, unspecified bronchus or lung: Secondary | ICD-10-CM

## 2013-12-05 DIAGNOSIS — C3490 Malignant neoplasm of unspecified part of unspecified bronchus or lung: Secondary | ICD-10-CM

## 2013-12-05 DIAGNOSIS — J438 Other emphysema: Secondary | ICD-10-CM | POA: Insufficient documentation

## 2013-12-05 LAB — COMPREHENSIVE METABOLIC PANEL (CC13)
ALK PHOS: 108 U/L (ref 40–150)
ALT: 18 U/L (ref 0–55)
ANION GAP: 11 meq/L (ref 3–11)
AST: 23 U/L (ref 5–34)
Albumin: 3.5 g/dL (ref 3.5–5.0)
BILIRUBIN TOTAL: 0.49 mg/dL (ref 0.20–1.20)
BUN: 6.9 mg/dL — AB (ref 7.0–26.0)
CO2: 27 meq/L (ref 22–29)
Calcium: 9.8 mg/dL (ref 8.4–10.4)
Chloride: 101 mEq/L (ref 98–109)
Creatinine: 0.7 mg/dL (ref 0.6–1.1)
GLUCOSE: 97 mg/dL (ref 70–140)
Potassium: 3.8 mEq/L (ref 3.5–5.1)
Sodium: 139 mEq/L (ref 136–145)
Total Protein: 7.4 g/dL (ref 6.4–8.3)

## 2013-12-05 LAB — CBC WITH DIFFERENTIAL/PLATELET
BASO%: 0.8 % (ref 0.0–2.0)
BASOS ABS: 0.1 10*3/uL (ref 0.0–0.1)
EOS ABS: 0.4 10*3/uL (ref 0.0–0.5)
EOS%: 5.2 % (ref 0.0–7.0)
HEMATOCRIT: 44.7 % (ref 34.8–46.6)
HEMOGLOBIN: 14.8 g/dL (ref 11.6–15.9)
LYMPH%: 17.3 % (ref 14.0–49.7)
MCH: 33.2 pg (ref 25.1–34.0)
MCHC: 33.1 g/dL (ref 31.5–36.0)
MCV: 100.1 fL (ref 79.5–101.0)
MONO#: 1 10*3/uL — ABNORMAL HIGH (ref 0.1–0.9)
MONO%: 13.7 % (ref 0.0–14.0)
NEUT%: 63 % (ref 38.4–76.8)
NEUTROS ABS: 4.4 10*3/uL (ref 1.5–6.5)
Platelets: 300 10*3/uL (ref 145–400)
RBC: 4.46 10*6/uL (ref 3.70–5.45)
RDW: 13.3 % (ref 11.2–14.5)
WBC: 7 10*3/uL (ref 3.9–10.3)
lymph#: 1.2 10*3/uL (ref 0.9–3.3)

## 2013-12-05 MED ORDER — IOHEXOL 300 MG/ML  SOLN
80.0000 mL | Freq: Once | INTRAMUSCULAR | Status: AC | PRN
  Administered 2013-12-05: 80 mL via INTRAVENOUS

## 2013-12-11 ENCOUNTER — Ambulatory Visit (HOSPITAL_BASED_OUTPATIENT_CLINIC_OR_DEPARTMENT_OTHER): Payer: BC Managed Care – PPO | Admitting: Internal Medicine

## 2013-12-11 ENCOUNTER — Telehealth: Payer: Self-pay | Admitting: Internal Medicine

## 2013-12-11 VITALS — BP 133/80 | HR 96 | Temp 97.9°F | Resp 18 | Ht 63.0 in | Wt 130.7 lb

## 2013-12-11 DIAGNOSIS — I319 Disease of pericardium, unspecified: Secondary | ICD-10-CM

## 2013-12-11 DIAGNOSIS — C3410 Malignant neoplasm of upper lobe, unspecified bronchus or lung: Secondary | ICD-10-CM

## 2013-12-11 DIAGNOSIS — C3491 Malignant neoplasm of unspecified part of right bronchus or lung: Secondary | ICD-10-CM

## 2013-12-11 NOTE — Progress Notes (Signed)
[place] Telephone:(336) [phone]   Fax:(336) [phone]  OFFICE PROGRESS NOTE   DIAGNOSIS: Stage IIIA non-small cell lung cancer, adenocarcinoma with negative EGFR mutation and negative ALK gene translocation diagnosed in March of 2014   PRIOR THERAPY:  1) Concurrent chemoradiation with weekly carboplatin for AUC of 2 and paclitaxel 45 mg/M2, status post 8 cycles, last dose was given on 11/13/2012 with partial response.  2) Consolidation chemotherapy with carboplatin for AUC of 5 on day 1 and gemcitabine 1000 mg/M2 on days 1 and 8 every 3 weeks, status post 3 cycles, last dose was given 02/20/2013 with mild improvement in her disease . First cycle was given on 01/02/2013.   CURRENT THERAPY: Observation  CHEMOTHERAPY INTENT: Control  CURRENT # OF CHEMOTHERAPY CYCLES: 0  CURRENT ANTIEMETICS: Zofran, dexamethasone and Compazine  CURRENT SMOKING STATUS: Former smoker, quit in [place]: None  CURRENT BISPHOSPHONATES USE: None  PAIN MANAGEMENT: 8/10, currently Percocet 5/325, 1-2 tabs q 4 hours  NARCOTICS INDUCED CONSTIPATION: None  LIVING WILL AND CODE STATUS: Full code  INTERVAL HISTORY: Linda Donovan 58 y.o. female returns to the clinic today for followup visit accompanied by her husband and daughter. She is feeling fine today except for persistent cough productive of whitish sputum. She denied having any significant shortness of breath is, chest pain or hemoptysis. She denied having any nausea or vomiting, no fever or chills. She has no weight loss or night sweats. She had repeat CT scan of the chest performed recently and she is here for evaluation and discussion of her scan results.  MEDICAL HISTORY: Past Medical History  Diagnosis Date  . COPD (chronic obstructive pulmonary disease)   . ADD (attention deficit disorder)   . Hx of radiation therapy 09/28/12- 11/17/12    RU lung mass, mediastinum chest, 63 gray 35 fx  . Non-small cell  lung cancer 09/18/2012    RUL    ALLERGIES:  is allergic to clinoril and morphine and related.  MEDICATIONS:  Current Outpatient Prescriptions  Medication Sig Dispense Refill  . albuterol (PROVENTIL HFA;VENTOLIN HFA) 108 (90 BASE) MCG/ACT inhaler Inhale 2 puffs into the lungs every 6 (six) hours as needed for wheezing.  8.5 g  5  . amphetamine-dextroamphetamine (ADDERALL) 20 MG tablet Take 20 mg by mouth daily before breakfast.       . aspirin 81 MG tablet Take 81 mg by mouth daily.      . budesonide-formoterol (SYMBICORT) 160-4.5 MCG/ACT inhaler Inhale 2 puffs into the lungs 2 (two) times daily.  1 Inhaler  0  . calcium-vitamin D (OSCAL WITH D) 250-125 MG-UNIT per tablet Take 1 tablet by mouth daily.      Marland Kitchen HYDROcodone-homatropine (HYDROMET) 5-1.5 MG/5ML syrup Take 5 mLs by mouth every 6 (six) hours as needed for cough.  240 mL  0  . ibuprofen (ADVIL,MOTRIN) 200 MG tablet Take 200 mg by mouth at bedtime as needed for pain.      Marland Kitchen loratadine (CLARITIN) 10 MG tablet Take 10 mg by mouth daily as needed.       . magnesium oxide (MAG-OX) 400 MG tablet Take 1 tablet (400 mg total) by mouth daily.  60 tablet  2  . mometasone (NASONEX) 50 MCG/ACT nasal spray Place 2 sprays into the nose daily as needed.  17 g  6  . Multiple Vitamins-Minerals (MULTIVITAMIN WITH MINERALS) tablet Take 1 tablet by mouth daily.      Marland Kitchen omeprazole (  PRILOSEC) 20 MG capsule Take 1 capsule (20 mg total) by mouth daily.  60 capsule  11  . tiotropium (SPIRIVA) 18 MCG inhalation capsule Place 1 capsule (18 mcg total) into inhaler and inhale daily.  30 capsule  6  . traZODone (DESYREL) 50 MG tablet Take 50 mg by mouth at bedtime.      Marland Kitchen zinc gluconate 50 MG tablet Take 50 mg by mouth daily.       No current facility-administered medications for this visit.    SURGICAL HISTORY:  Past Surgical History  Procedure Laterality Date  . Total abdominal hysterectomy    . Shoulder arthroscopy    . Video bronchoscopy Bilateral  07/25/2012    Procedure: VIDEO BRONCHOSCOPY WITH FLUORO;  Surgeon: Tanda Rockers, MD;  Location: [facility];  Service: Endoscopy;  Laterality: Bilateral;  . Lump removed from breasr right  2003  . Endobronchial ultrasound Bilateral 09/04/2012    Procedure: ENDOBRONCHIAL ULTRASOUND;  Surgeon: Collene Gobble, MD;  Location: [facility];  Service: Cardiopulmonary;  Laterality: Bilateral;    REVIEW OF SYSTEMS:  A comprehensive review of systems was negative except for: Respiratory: positive for cough and sputum   PHYSICAL EXAMINATION: General appearance: alert, cooperative, fatigued and no distress Head: Normocephalic, without obvious abnormality, atraumatic Neck: no adenopathy, no JVD, supple, symmetrical, trachea midline and thyroid not enlarged, symmetric, no tenderness/mass/nodules Lymph nodes: Cervical, supraclavicular, and axillary nodes normal. Resp: clear to auscultation bilaterally and normal percussion bilaterally Back: symmetric, no curvature. ROM normal. No CVA tenderness. Cardio: regular rate and rhythm, S1, S2 normal, no murmur, click, rub or gallop and normal apical impulse GI: soft, non-tender; bowel sounds normal; no masses,  no organomegaly Extremities: extremities normal, atraumatic, no cyanosis or edema Neurologic: Alert and oriented X 3, normal strength and tone. Normal symmetric reflexes. Normal coordination and gait  ECOG PERFORMANCE STATUS: 1 - Symptomatic but completely ambulatory  Blood pressure 133/80, pulse 96, temperature 97.9 F (36.6 C), temperature source Oral, resp. rate 18, height $RemoveBe'5\' 3"'[id number]$  (1.6 m), weight 130 lb 11.2 oz (59.285 kg).  LABORATORY DATA: Lab Results  Component Value Date   WBC 7.0 12/05/2013   HGB 14.8 12/05/2013   HCT 44.7 12/05/2013   MCV 100.1 12/05/2013   PLT 300 12/05/2013      Chemistry      Component Value Date/Time   NA 139 12/05/2013 0803   NA 135 03/08/2013 1734   K 3.8 12/05/2013 0803   K 4.1 03/08/2013 1734   CL 101 03/08/2013 1734   CL  104 11/13/2012 1122   CO2 27 12/05/2013 0803   CO2 30 03/08/2013 1734   BUN 6.9* 12/05/2013 0803   BUN 8 03/08/2013 1734   CREATININE 0.7 12/05/2013 0803   CREATININE 0.6 03/08/2013 1734      Component Value Date/Time   CALCIUM 9.8 12/05/2013 0803   CALCIUM 8.9 03/08/2013 1734   ALKPHOS 108 12/05/2013 0803   ALKPHOS 90 09/04/2012 0815   AST 23 12/05/2013 0803   AST 22 09/04/2012 0815   ALT 18 12/05/2013 0803   ALT 16 09/04/2012 0815   BILITOT 0.49 12/05/2013 0803   BILITOT 0.6 09/04/2012 0815       RADIOGRAPHIC STUDIES: Ct Chest W Contrast  12/05/2013   CLINICAL DATA:  Lung cancer diagnosed April 2014. Completed chemotherapy and radiation. Restaging.  EXAM: CT CHEST WITH CONTRAST  TECHNIQUE: Multidetector CT imaging of the chest was performed during intravenous contrast administration.  CONTRAST:  35mL OMNIPAQUE IOHEXOL 300  MG/ML  SOLN  COMPARISON:  09/07/2013  FINDINGS: Posterior right upper lobe lung mass is again noted, unchanged in size measuring 5.5 x 2.5 cm on image 20 on today's study. There is increasing surrounding airspace disease and pleural thickening/effusion. I suspect this is related to postradiation changes. Persistent airspace disease in the superior segment of the right lower lobe, likely radiation changes/fibrosis.  Severe centrilobular emphysema. Predominately linear airspace disease within the lingula, likely scarring or atelectasis. No pleural effusions.  Small nodules along the anterior lower aspect of the right major fissure on images 39 and 41 are stable. No new or enlarging pulmonary nodules.  Moderate pericardial effusion, increased since prior study. Enlarged subcarinal lymph node measures 1.8 cm in short axis diameter, stable. Right hilar lymph node on image 28 has a short axis diameter of 7 mm, stable.  Heart is normal size.  Aorta is normal caliber.  Extensive calcifications throughout the breast soft tissues bilaterally, stable. This could be correlated with mammography. Imaging into  the upper abdomen shows no acute findings.  New mild compression through the superior endplate of T5. No visible focal bone lesion.  IMPRESSION: Stable posterior right upper lobe lung mass. Increasing surrounding airspace disease and adjacent pleural effusion or pleural thickening, presumably related to radiation changes. Recommend attention on followup imaging.  Stable subcarinal and right hilar lymph nodes.  Increasing pericardial effusion.  Now moderate.  New mild compression fracture through the superior endplate of T5.   Electronically Signed   By: Rolm Baptise M.D.   On: 12/05/2013 10:25   ASSESSMENT AND PLAN: This is a very pleasant 58 years old white female with stage IIIa non-small cell lung cancer, adenocarcinoma with negative EGFR mutation and negative ALK gene translocation is status post concurrent chemoradiation with weekly carboplatin and paclitaxel followed by consolidation chemotherapy with 3 cycles of carboplatin and gemcitabine. The recent CT scan of the chest showed stable disease except for enlarging pericardial effusion. I discussed the scan results and showed the images to the patient and her family. I recommended for her to continue on observation for now with repeat CT scan of the chest in 3 months. Regarding the pericardial effusion, I will refer the patient to cardiology for evaluation. The patient was advised to call immediately if she has any concerning symptoms in the interval.  The patient voices understanding of current disease status and treatment options and is in agreement with the current care plan.  All questions were answered. The patient knows to call the clinic with any problems, questions or concerns. We can certainly see the patient much sooner if necessary.  Disclaimer: This note was dictated with voice recognition software. Similar sounding words can inadvertently be transcribed and may not be corrected upon review.

## 2013-12-11 NOTE — Telephone Encounter (Signed)
gv adn printed appt sched and avs for pt for Aug and OCT,....pt sched to see Dr. Fletcher Anon on 8.4 @ 8:30am

## 2013-12-12 ENCOUNTER — Telehealth: Payer: Self-pay | Admitting: Emergency Medicine

## 2013-12-12 MED ORDER — BUDESONIDE-FORMOTEROL FUMARATE 160-4.5 MCG/ACT IN AERO: 2.0000 | INHALATION_SPRAY | Freq: Two times a day (BID) | RESPIRATORY_TRACT | Status: DC

## 2013-12-12 NOTE — Telephone Encounter (Signed)
Advised pt that RB had signed airline form and that I had faxed back to [phone]. She will pick up original form at front and copy placed to be scanned

## 2013-12-12 NOTE — Telephone Encounter (Signed)
Spoke with pt and advised that sample of Symbicort was left at front desk for pick up.  Pt will also drop off a form from airline for consent to fly.  She will leave it attn Meghan and Dr Lamonte Sakai

## 2013-12-12 NOTE — Telephone Encounter (Signed)
I have these forms and have filled them out and placed for RB to sign today. Will call and notify pt once they are signed. Nothing further needed

## 2013-12-26 ENCOUNTER — Telehealth: Payer: Self-pay | Admitting: Emergency Medicine

## 2013-12-26 NOTE — Telephone Encounter (Signed)
Called and spoke with pts husband and he stated that this was not for the pt but his daughter.  Nothing further is needed.

## 2014-01-01 ENCOUNTER — Encounter: Payer: Self-pay | Admitting: Cardiovascular Disease

## 2014-01-03 ENCOUNTER — Inpatient Hospital Stay (HOSPITAL_COMMUNITY)
Admission: AD | Admit: 2014-01-03 | Discharge: 2014-01-09 | DRG: 238 | Disposition: A | Discharge status: Home / Self Care | Payer: BC Managed Care – PPO | Source: Ambulatory Visit | Attending: Cardiology | Admitting: Cardiology

## 2014-01-03 ENCOUNTER — Inpatient Hospital Stay (HOSPITAL_COMMUNITY): Payer: BC Managed Care – PPO

## 2014-01-03 ENCOUNTER — Encounter: Payer: Self-pay | Admitting: Cardiology

## 2014-01-03 ENCOUNTER — Other Ambulatory Visit: Payer: Self-pay | Admitting: Cardiology

## 2014-01-03 ENCOUNTER — Ambulatory Visit (HOSPITAL_BASED_OUTPATIENT_CLINIC_OR_DEPARTMENT_OTHER): Payer: BC Managed Care – PPO | Admitting: Radiology

## 2014-01-03 ENCOUNTER — Ambulatory Visit (INDEPENDENT_AMBULATORY_CARE_PROVIDER_SITE_OTHER): Payer: BC Managed Care – PPO | Admitting: Cardiology

## 2014-01-03 ENCOUNTER — Encounter (HOSPITAL_COMMUNITY): Payer: Self-pay | Admitting: *Deleted

## 2014-01-03 VITALS — BP 118/80 | HR 107 | Ht 63.0 in | Wt 131.0 lb

## 2014-01-03 DIAGNOSIS — I313 Pericardial effusion (noninflammatory): Secondary | ICD-10-CM

## 2014-01-03 DIAGNOSIS — F988 Other specified behavioral and emotional disorders with onset usually occurring in childhood and adolescence: Secondary | ICD-10-CM | POA: Diagnosis present

## 2014-01-03 DIAGNOSIS — R000 Tachycardia, unspecified: Secondary | ICD-10-CM

## 2014-01-03 DIAGNOSIS — Z7982 Long term (current) use of aspirin: Secondary | ICD-10-CM

## 2014-01-03 DIAGNOSIS — I319 Disease of pericardium, unspecified: Secondary | ICD-10-CM

## 2014-01-03 DIAGNOSIS — Z923 Personal history of irradiation: Secondary | ICD-10-CM

## 2014-01-03 DIAGNOSIS — C3410 Malignant neoplasm of upper lobe, unspecified bronchus or lung: Secondary | ICD-10-CM | POA: Diagnosis present

## 2014-01-03 DIAGNOSIS — C3490 Malignant neoplasm of unspecified part of unspecified bronchus or lung: Secondary | ICD-10-CM

## 2014-01-03 DIAGNOSIS — I3139 Other pericardial effusion (noninflammatory): Secondary | ICD-10-CM

## 2014-01-03 DIAGNOSIS — J441 Chronic obstructive pulmonary disease with (acute) exacerbation: Secondary | ICD-10-CM

## 2014-01-03 DIAGNOSIS — R0602 Shortness of breath: Secondary | ICD-10-CM

## 2014-01-03 DIAGNOSIS — I314 Cardiac tamponade: Principal | ICD-10-CM

## 2014-01-03 DIAGNOSIS — Z9981 Dependence on supplemental oxygen: Secondary | ICD-10-CM

## 2014-01-03 DIAGNOSIS — Z79899 Other long term (current) drug therapy: Secondary | ICD-10-CM

## 2014-01-03 DIAGNOSIS — J449 Chronic obstructive pulmonary disease, unspecified: Secondary | ICD-10-CM | POA: Diagnosis present

## 2014-01-03 DIAGNOSIS — J4489 Other specified chronic obstructive pulmonary disease: Secondary | ICD-10-CM | POA: Diagnosis present

## 2014-01-03 DIAGNOSIS — J90 Pleural effusion, not elsewhere classified: Secondary | ICD-10-CM

## 2014-01-03 DIAGNOSIS — Z87891 Personal history of nicotine dependence: Secondary | ICD-10-CM

## 2014-01-03 DIAGNOSIS — Z9221 Personal history of antineoplastic chemotherapy: Secondary | ICD-10-CM

## 2014-01-03 DIAGNOSIS — I498 Other specified cardiac arrhythmias: Secondary | ICD-10-CM

## 2014-01-03 DIAGNOSIS — C3491 Malignant neoplasm of unspecified part of right bronchus or lung: Secondary | ICD-10-CM

## 2014-01-03 HISTORY — DX: Unspecified asthma, uncomplicated: J45.909

## 2014-01-03 HISTORY — DX: Pneumonia, unspecified organism: J18.9

## 2014-01-03 HISTORY — DX: Unspecified osteoarthritis, unspecified site: M19.90

## 2014-01-03 LAB — COMPREHENSIVE METABOLIC PANEL
ALT: 18 U/L (ref 0–35)
AST: 25 U/L (ref 0–37)
Albumin: 3.6 g/dL (ref 3.5–5.2)
Alkaline Phosphatase: 105 U/L (ref 39–117)
Anion gap: 13 (ref 5–15)
BUN: 7 mg/dL (ref 6–23)
CO2: 27 meq/L (ref 19–32)
CREATININE: 0.58 mg/dL (ref 0.50–1.10)
Calcium: 9.2 mg/dL (ref 8.4–10.5)
Chloride: 101 mEq/L (ref 96–112)
GLUCOSE: 89 mg/dL (ref 70–99)
Potassium: 3.9 mEq/L (ref 3.7–5.3)
SODIUM: 141 meq/L (ref 137–147)
Total Bilirubin: 0.3 mg/dL (ref 0.3–1.2)
Total Protein: 7.4 g/dL (ref 6.0–8.3)

## 2014-01-03 LAB — TYPE AND SCREEN
ABO/RH(D): A POS
Antibody Screen: NEGATIVE

## 2014-01-03 LAB — CBC WITH DIFFERENTIAL/PLATELET
BASOS ABS: 0 10*3/uL (ref 0.0–0.1)
Basophils Relative: 0 % (ref 0–1)
EOS ABS: 0.1 10*3/uL (ref 0.0–0.7)
Eosinophils Relative: 2 % (ref 0–5)
HEMATOCRIT: 41.2 % (ref 36.0–46.0)
Hemoglobin: 13.8 g/dL (ref 12.0–15.0)
LYMPHS ABS: 0.8 10*3/uL (ref 0.7–4.0)
LYMPHS PCT: 9 % — AB (ref 12–46)
MCH: 33.3 pg (ref 26.0–34.0)
MCHC: 33.5 g/dL (ref 30.0–36.0)
MCV: 99.3 fL (ref 78.0–100.0)
Monocytes Absolute: 0.9 10*3/uL (ref 0.1–1.0)
Monocytes Relative: 10 % (ref 3–12)
Neutro Abs: 7.3 10*3/uL (ref 1.7–7.7)
Neutrophils Relative %: 79 % — ABNORMAL HIGH (ref 43–77)
PLATELETS: 294 10*3/uL (ref 150–400)
RBC: 4.15 MIL/uL (ref 3.87–5.11)
RDW: 12.6 % (ref 11.5–15.5)
WBC: 9.2 10*3/uL (ref 4.0–10.5)

## 2014-01-03 LAB — POCT I-STAT 3, ART BLOOD GAS (G3+)
ACID-BASE EXCESS: 3 mmol/L — AB (ref 0.0–2.0)
Bicarbonate: 27.2 mEq/L — ABNORMAL HIGH (ref 20.0–24.0)
O2 SAT: 94 %
TCO2: 28 mmol/L (ref 0–100)
pCO2 arterial: 40.9 mmHg (ref 35.0–45.0)
pH, Arterial: 7.43 (ref 7.350–7.450)
pO2, Arterial: 68 mmHg — ABNORMAL LOW (ref 80.0–100.0)

## 2014-01-03 LAB — ABO/RH: ABO/RH(D): A POS

## 2014-01-03 LAB — URINALYSIS, ROUTINE W REFLEX MICROSCOPIC
Bilirubin Urine: NEGATIVE
Glucose, UA: NEGATIVE mg/dL
Hgb urine dipstick: NEGATIVE
Ketones, ur: NEGATIVE mg/dL
Leukocytes, UA: NEGATIVE
Nitrite: NEGATIVE
Protein, ur: NEGATIVE mg/dL
Specific Gravity, Urine: 1.012 (ref 1.005–1.030)
Urobilinogen, UA: 0.2 mg/dL (ref 0.0–1.0)
pH: 8 (ref 5.0–8.0)

## 2014-01-03 LAB — PRO B NATRIURETIC PEPTIDE: PRO B NATRI PEPTIDE: 149.8 pg/mL — AB (ref 0–125)

## 2014-01-03 LAB — TSH: TSH: 0.913 u[IU]/mL (ref 0.350–4.500)

## 2014-01-03 LAB — APTT: aPTT: 33 seconds (ref 24–37)

## 2014-01-03 LAB — PROTIME-INR
INR: 1.01 (ref 0.00–1.49)
Prothrombin Time: 13.3 seconds (ref 11.6–15.2)

## 2014-01-03 LAB — MRSA PCR SCREENING: MRSA by PCR: NEGATIVE

## 2014-01-03 LAB — PHOSPHORUS: PHOSPHORUS: 2.9 mg/dL (ref 2.3–4.6)

## 2014-01-03 LAB — MAGNESIUM: MAGNESIUM: 1.6 mg/dL (ref 1.5–2.5)

## 2014-01-03 MED ORDER — CEFUROXIME SODIUM 1.5 G IJ SOLR
1.5000 g | INTRAMUSCULAR | Status: DC
  Filled 2014-01-03 (×2): qty 1.5

## 2014-01-03 MED ORDER — SODIUM CHLORIDE 0.9 % IJ SOLN
3.0000 mL | Freq: Two times a day (BID) | INTRAMUSCULAR | Status: DC
  Administered 2014-01-03 – 2014-01-04 (×2): 3 mL via INTRAVENOUS

## 2014-01-03 MED ORDER — SODIUM CHLORIDE 0.9 % IJ SOLN: 3.0000 mL | INTRAMUSCULAR | Status: DC | PRN

## 2014-01-03 MED ORDER — SODIUM CHLORIDE 0.9 % IV SOLN: 250.0000 mL | INTRAVENOUS | Status: DC | PRN

## 2014-01-03 MED ORDER — ALBUTEROL SULFATE HFA 108 (90 BASE) MCG/ACT IN AERS: 2.0000 | INHALATION_SPRAY | Freq: Four times a day (QID) | RESPIRATORY_TRACT | Status: DC | PRN

## 2014-01-03 MED ORDER — DEXTROSE-NACL 5-0.9 % IV SOLN
INTRAVENOUS | Status: DC
  Administered 2014-01-03 – 2014-01-04 (×2): via INTRAVENOUS

## 2014-01-03 MED ORDER — ALBUTEROL SULFATE (2.5 MG/3ML) 0.083% IN NEBU
2.5000 mg | INHALATION_SOLUTION | Freq: Four times a day (QID) | RESPIRATORY_TRACT | Status: DC | PRN
  Administered 2014-01-03 – 2014-01-09 (×7): 2.5 mg via RESPIRATORY_TRACT
  Filled 2014-01-03 (×7): qty 3

## 2014-01-03 MED ORDER — BIOTENE DRY MOUTH MT LIQD
15.0000 mL | Freq: Two times a day (BID) | OROMUCOSAL | Status: DC
  Administered 2014-01-04 – 2014-01-09 (×11): 15 mL via OROMUCOSAL

## 2014-01-03 MED ORDER — BUDESONIDE-FORMOTEROL FUMARATE 160-4.5 MCG/ACT IN AERO
2.0000 | INHALATION_SPRAY | Freq: Two times a day (BID) | RESPIRATORY_TRACT | Status: DC
  Administered 2014-01-03 – 2014-01-09 (×12): 2 via RESPIRATORY_TRACT
  Filled 2014-01-03: qty 6

## 2014-01-03 NOTE — Progress Notes (Signed)
Limited Echocardiogram performed for Pericardial Effusion.

## 2014-01-03 NOTE — Progress Notes (Signed)
[address] [place], [place]  [address] Phone: [phone] Fax:  [phone]  Date:  01/03/2014   ID:  Linda Donovan, Linda Donovan 1956-05-01, MRN [id number]  PCP:  Pcp Not In System  Cardiologist:  Fransico Him, MD     History of Present Illness: Linda Donovan is a 58 y.o. female with a history of COPD and non small cell lung CA who present today for evaluation of pericardial effusion.  A recent chest CT showed a moderate sized pericardial effusion which was felt to possibly be due to prior XRT for her lung mass.  She denies any chest pain but does have chronic DOE.  She occasionally has some LE edema.  She has been noticing her heart race when she gets SOB exerting herself.  She has a resting tachycardia.   Wt Readings from Last 3 Encounters:  01/03/14 131 lb (59.421 kg)  12/11/13 130 lb 11.2 oz (59.285 kg)  09/11/13 132 lb 6.4 oz (60.056 kg)     Past Medical History  Diagnosis Date  . COPD (chronic obstructive pulmonary disease)   . ADD (attention deficit disorder)   . Hx of radiation therapy 09/28/12- 11/17/12    RU lung mass, mediastinum chest, 63 gray 35 fx  . Non-small cell lung cancer 09/18/2012    RUL    Current Outpatient Prescriptions  Medication Sig Dispense Refill  . albuterol (PROVENTIL HFA;VENTOLIN HFA) 108 (90 BASE) MCG/ACT inhaler Inhale 2 puffs into the lungs every 6 (six) hours as needed for wheezing.  8.5 g  5  . amphetamine-dextroamphetamine (ADDERALL) 20 MG tablet Take 20 mg by mouth daily before breakfast.       . aspirin 81 MG tablet Take 81 mg by mouth daily.      . budesonide-formoterol (SYMBICORT) 160-4.5 MCG/ACT inhaler Inhale 2 puffs into the lungs 2 (two) times daily.  1 Inhaler  0  . Calcium Carb-Ergocalciferol 250-125 MG-UNIT TABS Take by mouth.      . calcium-vitamin D (OSCAL WITH D) 250-125 MG-UNIT per tablet Take 1 tablet by mouth daily.      Marland Kitchen loratadine (CLARITIN) 10 MG tablet Take 10 mg by mouth daily as needed.       . magnesium  oxide (MAG-OX) 400 MG tablet Take 1 tablet (400 mg total) by mouth daily.  60 tablet  2  . Multiple Vitamins-Minerals (MULTIVITAMIN WITH MINERALS) tablet Take 1 tablet by mouth daily.      . traZODone (DESYREL) 50 MG tablet Take 50 mg by mouth at bedtime.      Marland Kitchen zinc gluconate 50 MG tablet Take 50 mg by mouth daily.       No current facility-administered medications for this visit.    Allergies:    Allergies  Allergen Reactions  . Clinoril [Sulindac] Hives  . Morphine Itching    Social History:  The patient  reports that she quit smoking about 17 years ago. Her smoking use included Cigarettes. She has a 20 pack-year smoking history. She has never used smokeless tobacco. She reports that she drinks alcohol. She reports that she does not use illicit drugs.   Family History:  The patient's family history includes COPD in her paternal uncle; Cancer in her maternal grandfather and paternal grandmother; Heart disease in her maternal grandmother and mother; Prostate cancer in her maternal grandfather.   ROS:  Please see the history of present illness.      All other systems reviewed and negative.  PHYSICAL EXAM: VS:  BP 118/80  Pulse 107  Ht 5\' 3"  (1.6 m)  Wt 131 lb (59.421 kg)  BMI 23.21 kg/m2 Well nourished, well developed, in no acute distress HEENT: normal Neck: no JVD Cardiac:  normal S1, S2; RRR; no murmurtachycardic Lungs:  Scattered rhonchi Abd: soft, nontender, no hepatomegaly Ext: no edema Skin: warm and dry Neuro:  CNs 2-12 intact, no focal abnormalities noted  EKG:  Sinus tachcyardia with PAC's at 107bpm and no ST changes     ASSESSMENT AND PLAN:  1. Large pericardial effusion - 2D echo done in the office shows a large pericardial effusion that is circumferential.  The RV is very small.  There is evidence of significant change in MV inflow velocity with respiration concerning for early tamponade.  This could be radiation induced but more concerning, given underlying  lung CA, that this could be a malignant effusion. - Admit to CCU - CVTS consult to evaluate for pericardial window today - check PT/INR/CMET/CBC - hold ASA 2. Mild sinus tachycardia related to #1 3. COPD 4. 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

## 2014-01-03 NOTE — H&P (Signed)
[address], [place]  [place],  [address]  Phone: [phone]  Fax: [phone]   Date: 01/03/2014   ID: Kalyn, Hofstra [birth year], MRN [id number]   PCP: Pcp Not In System  Cardiologist: Fransico Him, MD   History of Present Illness:  Linda Donovan is a 58 y.o. female with a history of COPD and non small cell lung CA who present today for evaluation of pericardial effusion. A recent chest CT showed a moderate sized pericardial effusion which was felt to possibly be due to prior XRT for her lung mass. She denies any chest pain but does have chronic DOE. She occasionally has some LE edema. She has been noticing her heart race when she gets SOB exerting herself. She has a resting tachycardia.   Wt Readings from Last 3 Encounters:   01/03/14  131 lb (59.421 kg)   12/11/13  130 lb 11.2 oz (59.285 kg)   09/11/13  132 lb 6.4 oz (60.056 kg)    Past Medical History   Diagnosis  Date   .  COPD (chronic obstructive pulmonary disease)    .  ADD (attention deficit disorder)    .  Hx of radiation therapy  09/28/12- 11/17/12     RU lung mass, mediastinum chest, 63 gray 35 fx   .  Non-small cell lung cancer  09/18/2012     RUL    Current Outpatient Prescriptions   Medication  Sig  Dispense  Refill   .  albuterol (PROVENTIL HFA;VENTOLIN HFA) 108 (90 BASE) MCG/ACT inhaler  Inhale 2 puffs into the lungs every 6 (six) hours as needed for wheezing.  8.5 g  5   .  amphetamine-dextroamphetamine (ADDERALL) 20 MG tablet  Take 20 mg by mouth daily before breakfast.     .  aspirin 81 MG tablet  Take 81 mg by mouth daily.     .  budesonide-formoterol (SYMBICORT) 160-4.5 MCG/ACT inhaler  Inhale 2 puffs into the lungs 2 (two) times daily.  1 Inhaler  0   .  Calcium Carb-Ergocalciferol 250-125 MG-UNIT TABS  Take by mouth.     .  calcium-vitamin D (OSCAL WITH D) 250-125 MG-UNIT per tablet  Take 1 tablet by mouth daily.     Marland Kitchen  loratadine (CLARITIN) 10 MG tablet  Take 10 mg by mouth daily as  needed.     .  magnesium oxide (MAG-OX) 400 MG tablet  Take 1 tablet (400 mg total) by mouth daily.  60 tablet  2   .  Multiple Vitamins-Minerals (MULTIVITAMIN WITH MINERALS) tablet  Take 1 tablet by mouth daily.     .  traZODone (DESYREL) 50 MG tablet  Take 50 mg by mouth at bedtime.     Marland Kitchen  zinc gluconate 50 MG tablet  Take 50 mg by mouth daily.      No current facility-administered medications for this visit.    Allergies:  Allergies   Allergen  Reactions   .  Clinoril [Sulindac]  Hives   .  Morphine  Itching    Social History: The patient reports that she quit smoking about 17 years ago. Her smoking use included Cigarettes. She has a 20 pack-year smoking history. She has never used smokeless tobacco. She reports that she drinks alcohol. She reports that she does not use illicit drugs.  Family History: The patient's family history includes COPD in her paternal uncle; Cancer in her maternal grandfather and paternal grandmother; Heart disease in  her maternal grandmother and mother; Prostate cancer in her maternal grandfather.  ROS: Please see the history of present illness. All other systems reviewed and negative.  PHYSICAL EXAM:  VS: BP 118/80  Pulse 107  Ht 5\' 3"  (1.6 m)  Wt 131 lb (59.421 kg)  BMI 23.21 kg/m2  Well nourished, well developed, in no acute distress  HEENT: normal  Neck: no JVD  Cardiac: normal S1, S2; RRR; no murmur tachycardic  Lungs: Scattered rhonchi  Abd: soft, nontender, no hepatomegaly  Ext: no edema  Skin: warm and dry  Neuro: CNs 2-12 intact, no focal abnormalities noted   EKG: Sinus tachcyardia with PAC's at 107bpm and no ST changes   ASSESSMENT AND PLAN:  1. Large pericardial effusion - 2D echo done in the office shows a large pericardial effusion that is circumferential. The RV is very small. There is evidence of significant change in MV inflow velocity with respiration concerning for early tamponade. This could be radiation induced but more concerning,  given underlying lung CA, that this could be a malignant effusion. - Admit to CCU  - CVTS consult to evaluate for pericardial window today  - check PT/INR/CMET/CBC  - hold ASA  2. Mild sinus tachycardia related to #1 3. COPD 4. 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

## 2014-01-03 NOTE — Progress Notes (Signed)
UR Completed.  Vergie Living [phone] 01/03/2014

## 2014-01-03 NOTE — Consult Note (Signed)
[address]       [place] [address]             [phone]        Cayle M Newsome  Medical Record #[id number] Date of Birth: [birth year]  Referring: No ref. 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 She completed radiation therapy. She is undergoing ongoing chemotherapy under the direction of Dr. Julien Nordmann. At her last clinic visit a followup CT scan of the chest was performed which showed her malignancy to be stable however she had a moderate pericardial effusion. Over the past 2 weeks she has noticed increased dyspnea with exertion but not at rest. She is been using oxygen 2 L per minute at home for her COPD. She was seen in the cardiology office today by Dr. Radford Pax and an echocardiogram demonstrated a 2.5 cm pericardial effusion. There was abnormal transmitral flow however no definite sign of cardiac tamponade. Review of her most recent chest CT scan shows no significant pleural effusion with a stable mass in the posterior segment right upper lobe with some surrounding atelectasis. There is significant subcarinal adenopathy. She has not had a Port-A-Cath. She's had no significant untoward reaction her chemotherapy and has not required hospitalization. Her functional status is fairly good and she is maintaining weight and overall strength.  There is no history of heart disease. She states her baseline heart rate remains high. Heart rate currently is 108 per minute period heart rate at her clinic visit with Dr. Julien Nordmann in early July was 97. Blood pressure is been stable.  Current Activity/ Functional Status: Was with her husband. Last chemotherapy treatment by month ago. No recent COPD  flareups or requirement for antibiotics-steroids.   Zubrod Score: At the time of surgery this patient's most appropriate activity status/level should be described as: []     0    Normal activity, no symptoms []     1    Restricted in physical strenuous activity but ambulatory, able to do out light work [x]     2    Ambulatory and capable of self care, unable to do work activities, up and about                 more than 50%  Of the time                            []     3    Only limited self care, in bed greater than 50% of waking hours []     4    Completely disabled, no self care, confined to bed or chair []     5    Moribund  Past Medical History  Diagnosis Date  . COPD (chronic obstructive pulmonary disease)   . ADD (attention deficit disorder)   . Hx of radiation therapy 09/28/12- 11/17/12    RU lung mass, mediastinum chest, 63 gray 35 fx  . Non-small cell lung cancer 09/18/2012    RUL    Past Surgical History  Procedure Laterality Date  . Total abdominal hysterectomy    . Shoulder arthroscopy    . Video bronchoscopy Bilateral 07/25/2012  Procedure: VIDEO BRONCHOSCOPY WITH FLUORO;  Surgeon: Tanda Rockers, MD;  Location: [facility];  Service: Endoscopy;  Laterality: Bilateral;  . Lump removed from breasr right  2003  . Endobronchial ultrasound Bilateral 09/04/2012    Procedure: ENDOBRONCHIAL ULTRASOUND;  Surgeon: Collene Gobble, MD;  Location: [facility];  Service: Cardiopulmonary;  Laterality: Bilateral;    History  Smoking status  . Former Smoker -- 1.00 packs/day for 20 years  . Types: Cigarettes  . Quit date: 06/07/1996  Smokeless tobacco  . Never Used    History  Alcohol Use  . Yes    History   Social History  . Marital Status: Married    Spouse Name: N/A    Number of Children: N/A  . Years of Education: N/A   Occupational History  . Not on file.   Social History Main Topics  . Smoking status: Former Smoker -- 1.00 packs/day for 20 years    Types:  Cigarettes    Quit date: 06/07/1996  . Smokeless tobacco: Never Used  . Alcohol Use: Yes  . Drug Use: No  . Sexual Activity: Not on file   Other Topics Concern  . Not on file   Social History Narrative  . No narrative on file    Allergies  Allergen Reactions  . Clinoril [Sulindac] Hives  . Morphine Itching    Current Facility-Administered Medications  Medication Dose Route Frequency Provider Last Rate Last Dose  . 0.9 %  sodium chloride infusion  250 mL Intravenous PRN Sueanne Margarita, MD      . cefUROXime (ZINACEF) 1.5 g in dextrose 5 % 50 mL IVPB  1.5 g Intravenous 60 min Pre-Op Ivin Poot, MD      . dextrose 5 %-0.9 % sodium chloride infusion   Intravenous Continuous Ivin Poot, MD      . sodium chloride 0.9 % injection 3 mL  3 mL Intravenous Q12H Traci R Turner, MD      . sodium chloride 0.9 % injection 3 mL  3 mL Intravenous PRN Sueanne Margarita, MD        Prescriptions prior to admission  Medication Sig Dispense Refill  . albuterol (PROVENTIL HFA;VENTOLIN HFA) 108 (90 BASE) MCG/ACT inhaler Inhale 2 puffs into the lungs every 6 (six) hours as needed for wheezing.  8.5 g  5  . albuterol (PROVENTIL) (2.5 MG/3ML) 0.083% nebulizer solution Take 2.5 mg by nebulization every 6 (six) hours as needed for wheezing or shortness of breath.      . amphetamine-dextroamphetamine (ADDERALL) 20 MG tablet Take 20 mg by mouth daily before breakfast.       . aspirin 81 MG tablet Take 81 mg by mouth daily.      . budesonide-formoterol (SYMBICORT) 160-4.5 MCG/ACT inhaler Inhale 2 puffs into the lungs 2 (two) times daily.  1 Inhaler  0  . Calcium Carb-Ergocalciferol 250-125 MG-UNIT TABS Take 1 tablet by mouth daily.       . calcium-vitamin D (OSCAL WITH D) 250-125 MG-UNIT per tablet Take 1 tablet by mouth daily.      Marland Kitchen loratadine (CLARITIN) 10 MG tablet Take 10 mg by mouth daily as needed.       . magnesium oxide (MAG-OX) 400 MG tablet Take 1 tablet (400 mg total) by mouth daily.  60  tablet  2  . Multiple Vitamins-Minerals (MULTIVITAMIN WITH MINERALS) tablet Take 1 tablet by mouth daily.      . traZODone (DESYREL) 50 MG tablet Take  50 mg by mouth at bedtime.      Marland Kitchen zinc gluconate 50 MG tablet Take 50 mg by mouth daily.        Family History  Problem Relation Age of Onset  . COPD Paternal Uncle   . Heart disease Mother   . Heart disease Maternal Grandmother   . Cancer Paternal Grandmother     breast  . Prostate cancer Maternal Grandfather   . Cancer Maternal Grandfather     prostate     Review of Systems: The patient is right-hand dominant                                     The patient had a previous right breast biopsy which was negative    Cardiac Review of Systems: Y or N  Chest Pain [  no no]    Palpitations [place]  ] Syncope  no  ]   Presyncope no [   ]  General Review of Systems: [Y] = yes [  ]=no Constitional: recent weight change [  ]; anorexia [  ]; fatigue [  ]; nausea [  ]; night sweats [  ]; fever [  ]; or chills [  ]                                                               Dental: poor dentition[  ]; Last Dentist visit:   Eye : blurred vision [  ]; diplopia [   ]; vision changes [  ];  Amaurosis fugax[  ]; Resp: cough [  ];  wheezing[  ];  hemoptysis[  ]; shortness of breath[yes  ]; paroxysmal nocturnal dyspnea[  ]; dyspnea on exertion[S.  ]; or orthopnea[yes  ];  GI:  gallstones[  ], vomiting[  ];  dysphagia[  ]; melena[  ];  hematochezia [  ]; heartburn[  ];   Hx of  Colonoscopy[  ]; GU: kidney stones [  ]; hematuria[  ];   dysuria [  ];  nocturia[  ];  history of     obstruction [  ]; urinary frequency [  ]             Skin: rash, swelling[  ];, hair loss[  ];  peripheral edema[  ];  or itching[  ]; Musculosketetal: myalgias[  ];  joint swelling[  ];  joint erythema[  ];  joint pain[  ];  back pain[  ];  Heme/Lymph: bruising[  ];  bleeding[  ];  anemia[  ];  Neuro: TIA[  ];  headaches[  ];  stroke[  ];  vertigo[  ];  seizures[  ];    paresthesias[  ];  difficulty walking[  ];  Psych:depression[  ]; anxiety[  ];  Endocrine: diabetes[no  ];  thyroid dysfunction[  ];  Immunizations: Flu [  ]; Pneumococcal[  ];  Other:  Physical Exam: BP 140/78  Temp(Src) 98 F (36.7 C) (Oral)  Ht 5\' 3"  (1.6 m)  Wt 133 lb 13.1 oz (60.7 kg)  BMI 23.71 kg/m2  SpO2 96%  Exam  General appearance-Lang been comfortable alert and oriented company by husband HEENT-pupils equal neck without JVD mass or adenopathy  good dentition Lungs-scattered rhonchi wheeze right lung, left lung breath sounds distant Cardiac-increased heart rate, no rub or gallop or murmur Abdomen-nontender Extremities-no clubbing cyanosis or tenderness, no edema Vascular-no calf tenderness, strong pedal pulses present Neurologic-no local motor deficit   Diagnostic Studies & Laboratory data:     Recent Radiology Findings:   Dg Chest Port 1 View  01/03/2014   CLINICAL DATA:  History of lung carcinoma; COPD  EXAM: PORTABLE CHEST - 1 VIEW  COMPARISON:  Chest radiograph March 08, 2013; chest CT December 05, 2013  FINDINGS: The area of mass/consolidation in the right upper lobe appears less pronounced compared to recent CT examination. There is still patchy opacity in this area.  There is underlying emphysema. Mild interstitial prominence in the bases may in part reflect redistribution of blood flow to a viable segments of lung. There is no frank edema or consolidation apart from the opacity in the right upper lobe. The cardiac silhouette is enlarged with pulmonary vascularity reflecting underlying emphysema. Some of the apparent cardiac enlargement may be due to pericardial effusion based on appearance on recent CT. No adenopathy is appreciable radiographically. There are no bone lesions.  IMPRESSION: Less opacity is noted in the right upper lobe compared to recent CT. There is still some patchy opacity in this area, and there may well be mass with postobstructive pneumonitis in  this area. Disease has not progressed in this area compared to most recent prior study. There is no new opacity. There is underlying emphysema with cardiomegaly ; the cardiomegaly in part may be due to pericardial effusion. Recent CT shows sizable pericardial effusion. The previously noted adenopathy, particularly in the sub- carinal region, is not appreciable on radiographic examination. CT is more sensitive for adenopathy than is radiography.   Electronically Signed   By: Lowella Grip M.D.   On: 01/03/2014 14:09      Recent Lab Findings: Lab Results  Component Value Date   WBC 9.2 01/03/2014   HGB 13.8 01/03/2014   HCT 41.2 01/03/2014   PLT 294 01/03/2014   GLUCOSE 89 01/03/2014   ALT 18 01/03/2014   AST 25 01/03/2014   NA 141 01/03/2014   K 3.9 01/03/2014   CL 101 01/03/2014   CREATININE 0.58 01/03/2014   BUN 7 01/03/2014   CO2 27 01/03/2014   TSH 0.913 01/03/2014   INR 1.01 01/03/2014      Assessment / Plan:      Progressive increase in pericardial effusion over the past 3 weeks associated with dyspnea exertion. The patient would benefit from a subxiphoid pericardial window. The procedure is been explained to her in detail and will be scheduled for tomorrow by Dr. Roxan Hockey.      @ME1 @ 01/03/2014 2:49 PM

## 2014-01-04 ENCOUNTER — Encounter (HOSPITAL_COMMUNITY): Payer: BC Managed Care – PPO | Admitting: Anesthesiology

## 2014-01-04 ENCOUNTER — Inpatient Hospital Stay (HOSPITAL_COMMUNITY): Payer: BC Managed Care – PPO

## 2014-01-04 ENCOUNTER — Encounter (HOSPITAL_COMMUNITY): Payer: Self-pay | Admitting: Certified Registered Nurse Anesthetist

## 2014-01-04 ENCOUNTER — Inpatient Hospital Stay (HOSPITAL_COMMUNITY): Payer: BC Managed Care – PPO | Admitting: Anesthesiology

## 2014-01-04 ENCOUNTER — Encounter (HOSPITAL_COMMUNITY)
Admission: AD | Disposition: A | Discharge status: Home / Self Care | Payer: Self-pay | Source: Ambulatory Visit | Attending: Cardiology

## 2014-01-04 DIAGNOSIS — I319 Disease of pericardium, unspecified: Principal | ICD-10-CM

## 2014-01-04 DIAGNOSIS — I313 Pericardial effusion (noninflammatory): Secondary | ICD-10-CM | POA: Diagnosis present

## 2014-01-04 DIAGNOSIS — I3139 Other pericardial effusion (noninflammatory): Secondary | ICD-10-CM | POA: Diagnosis present

## 2014-01-04 DIAGNOSIS — I314 Cardiac tamponade: Secondary | ICD-10-CM

## 2014-01-04 HISTORY — PX: INTRAOPERATIVE TRANSESOPHAGEAL ECHOCARDIOGRAM: SHX5062

## 2014-01-04 HISTORY — PX: SUBXYPHOID PERICARDIAL WINDOW: SHX5075

## 2014-01-04 LAB — BASIC METABOLIC PANEL
Anion gap: 11 (ref 5–15)
BUN: 5 mg/dL — AB (ref 6–23)
CALCIUM: 8.8 mg/dL (ref 8.4–10.5)
CO2: 28 mEq/L (ref 19–32)
CREATININE: 0.58 mg/dL (ref 0.50–1.10)
Chloride: 102 mEq/L (ref 96–112)
GFR calc non Af Amer: >90 mL/min (ref >90)
Glucose, Bld: 94 mg/dL (ref 70–99)
Potassium: 4.2 mEq/L (ref 3.7–5.3)
Sodium: 141 mEq/L (ref 137–147)

## 2014-01-04 LAB — SURGICAL PCR SCREEN
MRSA, PCR: NEGATIVE
Staphylococcus aureus: NEGATIVE

## 2014-01-04 LAB — CBC
HCT: 39.2 % (ref 36.0–46.0)
HEMOGLOBIN: 12.9 g/dL (ref 12.0–15.0)
MCH: 32.7 pg (ref 26.0–34.0)
MCHC: 32.9 g/dL (ref 30.0–36.0)
MCV: 99.5 fL (ref 78.0–100.0)
Platelets: 252 10*3/uL (ref 150–400)
RBC: 3.94 MIL/uL (ref 3.87–5.11)
RDW: 12.5 % (ref 11.5–15.5)
WBC: 7.2 10*3/uL (ref 4.0–10.5)

## 2014-01-04 LAB — BODY FLUID CELL COUNT WITH DIFFERENTIAL
Eos, Fluid: NONE SEEN %
Total Nucleated Cell Count, Fluid: 1 cu mm (ref 0–1000)

## 2014-01-04 LAB — MAGNESIUM: MAGNESIUM: 1.5 mg/dL (ref 1.5–2.5)

## 2014-01-04 SURGERY — CREATION, PERICARDIAL WINDOW, SUBXIPHOID APPROACH
Anesthesia: General | Site: Chest

## 2014-01-04 MED ORDER — SENNOSIDES-DOCUSATE SODIUM 8.6-50 MG PO TABS
1.0000 | ORAL_TABLET | Freq: Every day | ORAL | Status: DC
  Administered 2014-01-06 (×2): 1 via ORAL
  Filled 2014-01-04 (×5): qty 1

## 2014-01-04 MED ORDER — ONDANSETRON HCL 4 MG/2ML IJ SOLN: 4.0000 mg | Freq: Four times a day (QID) | INTRAMUSCULAR | Status: DC | PRN

## 2014-01-04 MED ORDER — BISACODYL 5 MG PO TBEC
10.0000 mg | DELAYED_RELEASE_TABLET | Freq: Every day | ORAL | Status: DC
  Administered 2014-01-07: 10 mg via ORAL
  Filled 2014-01-04 (×4): qty 2

## 2014-01-04 MED ORDER — TRAZODONE HCL 50 MG PO TABS
50.0000 mg | ORAL_TABLET | Freq: Every day | ORAL | Status: DC
  Administered 2014-01-06 – 2014-01-08 (×4): 50 mg via ORAL
  Filled 2014-01-04 (×7): qty 1

## 2014-01-04 MED ORDER — ETOMIDATE 2 MG/ML IV SOLN
INTRAVENOUS | Status: DC | PRN
  Administered 2014-01-04: 20 mg via INTRAVENOUS

## 2014-01-04 MED ORDER — MIDAZOLAM HCL 2 MG/2ML IJ SOLN
INTRAMUSCULAR | Status: AC
  Filled 2014-01-04: qty 2

## 2014-01-04 MED ORDER — NALOXONE HCL 0.4 MG/ML IJ SOLN: 0.4000 mg | INTRAMUSCULAR | Status: DC | PRN

## 2014-01-04 MED ORDER — NEOSTIGMINE METHYLSULFATE 10 MG/10ML IV SOLN
INTRAVENOUS | Status: DC | PRN
  Administered 2014-01-04: 5 mg via INTRAVENOUS

## 2014-01-04 MED ORDER — LIDOCAINE HCL (CARDIAC) 20 MG/ML IV SOLN
INTRAVENOUS | Status: AC
  Filled 2014-01-04: qty 5

## 2014-01-04 MED ORDER — PHENYLEPHRINE HCL 10 MG/ML IJ SOLN
INTRAMUSCULAR | Status: DC | PRN
  Administered 2014-01-04 (×2): 40 ug via INTRAVENOUS

## 2014-01-04 MED ORDER — CEFUROXIME SODIUM 1.5 G IJ SOLR
1.5000 g | INTRAMUSCULAR | Status: DC | PRN
  Administered 2014-01-04: 1.5 g via INTRAVENOUS

## 2014-01-04 MED ORDER — FENTANYL 10 MCG/ML IV SOLN
INTRAVENOUS | Status: DC
  Administered 2014-01-04: 235 ug via INTRAVENOUS
  Administered 2014-01-04: 45 ug via INTRAVENOUS
  Administered 2014-01-05: 135 ug via INTRAVENOUS
  Administered 2014-01-05: 75 ug via INTRAVENOUS
  Administered 2014-01-05: 95.99 ug via INTRAVENOUS
  Administered 2014-01-05: 75 ug via INTRAVENOUS
  Administered 2014-01-05: 01:00:00 via INTRAVENOUS
  Administered 2014-01-05: 210 ug via INTRAVENOUS
  Administered 2014-01-05: 105 ug via INTRAVENOUS
  Administered 2014-01-06: 5 ug via INTRAVENOUS
  Administered 2014-01-06: 01:00:00 via INTRAVENOUS
  Administered 2014-01-06: 75 ug via INTRAVENOUS
  Administered 2014-01-06: 15 ug/h via INTRAVENOUS
  Administered 2014-01-06 (×2): 75 ug via INTRAVENOUS
  Administered 2014-01-06: 30 ug via INTRAVENOUS
  Administered 2014-01-07: 1 ug via INTRAVENOUS
  Administered 2014-01-07 (×2): via INTRAVENOUS
  Administered 2014-01-07: 5 ug via INTRAVENOUS
  Filled 2014-01-04 (×3): qty 50

## 2014-01-04 MED ORDER — ARTIFICIAL TEARS OP OINT
TOPICAL_OINTMENT | OPHTHALMIC | Status: DC | PRN
  Administered 2014-01-04: 1 via OPHTHALMIC

## 2014-01-04 MED ORDER — DIPHENHYDRAMINE HCL 12.5 MG/5ML PO ELIX
12.5000 mg | ORAL_SOLUTION | Freq: Four times a day (QID) | ORAL | Status: DC | PRN
  Filled 2014-01-04: qty 5

## 2014-01-04 MED ORDER — ACETAMINOPHEN 160 MG/5ML PO SOLN
1000.0000 mg | Freq: Four times a day (QID) | ORAL | Status: DC
  Filled 2014-01-04 (×20): qty 40

## 2014-01-04 MED ORDER — LACTATED RINGERS IV SOLN
INTRAVENOUS | Status: DC
  Administered 2014-01-04: 13:00:00 via INTRAVENOUS

## 2014-01-04 MED ORDER — ROCURONIUM BROMIDE 50 MG/5ML IV SOLN
INTRAVENOUS | Status: AC
  Filled 2014-01-04: qty 1

## 2014-01-04 MED ORDER — OXYCODONE HCL 5 MG/5ML PO SOLN: 5.0000 mg | Freq: Once | ORAL | Status: DC | PRN

## 2014-01-04 MED ORDER — POTASSIUM CHLORIDE 10 MEQ/50ML IV SOLN
10.0000 meq | Freq: Every day | INTRAVENOUS | Status: DC | PRN
  Filled 2014-01-04: qty 50

## 2014-01-04 MED ORDER — NEOSTIGMINE METHYLSULFATE 10 MG/10ML IV SOLN
INTRAVENOUS | Status: AC
  Filled 2014-01-04: qty 1

## 2014-01-04 MED ORDER — LACTATED RINGERS IV SOLN
INTRAVENOUS | Status: DC | PRN
  Administered 2014-01-04 (×2): via INTRAVENOUS

## 2014-01-04 MED ORDER — DEXTROSE-NACL 5-0.9 % IV SOLN
INTRAVENOUS | Status: DC
  Administered 2014-01-05: 04:00:00 via INTRAVENOUS

## 2014-01-04 MED ORDER — FENTANYL CITRATE 0.05 MG/ML IJ SOLN
INTRAMUSCULAR | Status: AC
  Filled 2014-01-04: qty 5

## 2014-01-04 MED ORDER — FENTANYL CITRATE 0.05 MG/ML IJ SOLN
25.0000 ug | INTRAMUSCULAR | Status: DC | PRN
  Administered 2014-01-04 (×2): 50 ug via INTRAVENOUS

## 2014-01-04 MED ORDER — FENTANYL CITRATE 0.05 MG/ML IJ SOLN
INTRAMUSCULAR | Status: DC | PRN
  Administered 2014-01-04 (×2): 50 ug via INTRAVENOUS

## 2014-01-04 MED ORDER — DIPHENHYDRAMINE HCL 50 MG/ML IJ SOLN: 12.5000 mg | Freq: Four times a day (QID) | INTRAMUSCULAR | Status: DC | PRN

## 2014-01-04 MED ORDER — MAGNESIUM SULFATE 4000MG/100ML IJ SOLN
4.0000 g | Freq: Once | INTRAMUSCULAR | Status: AC
  Administered 2014-01-04: 4 g via INTRAVENOUS
  Filled 2014-01-04: qty 100

## 2014-01-04 MED ORDER — SODIUM CHLORIDE 0.9 % IJ SOLN: 9.0000 mL | INTRAMUSCULAR | Status: DC | PRN

## 2014-01-04 MED ORDER — SUCCINYLCHOLINE CHLORIDE 20 MG/ML IJ SOLN
INTRAMUSCULAR | Status: DC | PRN
  Administered 2014-01-04: 100 mg via INTRAVENOUS

## 2014-01-04 MED ORDER — FENTANYL CITRATE 0.05 MG/ML IJ SOLN
INTRAMUSCULAR | Status: AC
  Administered 2014-01-04: 20 ug
  Filled 2014-01-04: qty 2

## 2014-01-04 MED ORDER — ONDANSETRON HCL 4 MG/2ML IJ SOLN
INTRAMUSCULAR | Status: DC | PRN
  Administered 2014-01-04: 4 mg via INTRAVENOUS

## 2014-01-04 MED ORDER — ACETAMINOPHEN 500 MG PO TABS
1000.0000 mg | ORAL_TABLET | Freq: Four times a day (QID) | ORAL | Status: DC
  Administered 2014-01-04 – 2014-01-07 (×11): 1000 mg via ORAL
  Filled 2014-01-04 (×21): qty 2

## 2014-01-04 MED ORDER — PROPOFOL 10 MG/ML IV BOLUS
INTRAVENOUS | Status: AC
  Filled 2014-01-04: qty 20

## 2014-01-04 MED ORDER — ARTIFICIAL TEARS OP OINT
TOPICAL_OINTMENT | OPHTHALMIC | Status: AC
  Filled 2014-01-04: qty 3.5

## 2014-01-04 MED ORDER — GLYCOPYRROLATE 0.2 MG/ML IJ SOLN
INTRAMUSCULAR | Status: AC
  Filled 2014-01-04: qty 3

## 2014-01-04 MED ORDER — OXYCODONE HCL 5 MG PO TABS: 5.0000 mg | ORAL_TABLET | Freq: Once | ORAL | Status: DC | PRN

## 2014-01-04 MED ORDER — LORATADINE 10 MG PO TABS
10.0000 mg | ORAL_TABLET | Freq: Every day | ORAL | Status: DC | PRN
  Filled 2014-01-04: qty 1

## 2014-01-04 MED ORDER — FENTANYL 10 MCG/ML IV SOLN
INTRAVENOUS | Status: DC
  Administered 2014-01-04: 15:00:00 via INTRAVENOUS
  Filled 2014-01-04: qty 50

## 2014-01-04 MED ORDER — ROCURONIUM BROMIDE 100 MG/10ML IV SOLN
INTRAVENOUS | Status: DC | PRN
  Administered 2014-01-04: 20 mg via INTRAVENOUS

## 2014-01-04 MED ORDER — DEXTROSE 5 % IV SOLN
1.5000 g | Freq: Two times a day (BID) | INTRAVENOUS | Status: AC
  Administered 2014-01-05 (×2): 1.5 g via INTRAVENOUS
  Filled 2014-01-04 (×2): qty 1.5

## 2014-01-04 MED ORDER — LIDOCAINE HCL (CARDIAC) 20 MG/ML IV SOLN
INTRAVENOUS | Status: DC | PRN
  Administered 2014-01-04: 100 mg via INTRAVENOUS

## 2014-01-04 MED ORDER — PROPOFOL 10 MG/ML IV BOLUS
INTRAVENOUS | Status: DC | PRN
  Administered 2014-01-04: 50 mg via INTRAVENOUS

## 2014-01-04 MED ORDER — TRAMADOL HCL 50 MG PO TABS
50.0000 mg | ORAL_TABLET | Freq: Four times a day (QID) | ORAL | Status: DC | PRN
  Administered 2014-01-05 – 2014-01-06 (×2): 100 mg via ORAL
  Filled 2014-01-04 (×2): qty 2

## 2014-01-04 MED ORDER — AMPHETAMINE-DEXTROAMPHETAMINE 10 MG PO TABS
20.0000 mg | ORAL_TABLET | Freq: Every day | ORAL | Status: DC
  Administered 2014-01-05 – 2014-01-09 (×5): 20 mg via ORAL
  Filled 2014-01-04 (×6): qty 2

## 2014-01-04 MED ORDER — GLYCOPYRROLATE 0.2 MG/ML IJ SOLN
INTRAMUSCULAR | Status: DC | PRN
  Administered 2014-01-04: .8 mg via INTRAVENOUS

## 2014-01-04 MED ORDER — ETOMIDATE 2 MG/ML IV SOLN
INTRAVENOUS | Status: AC
Start: 2014-01-04 — End: 2014-01-04
  Filled 2014-01-04: qty 10

## 2014-01-04 SURGICAL SUPPLY — 51 items
ADH SKN CLS APL DERMABOND .7 (GAUZE/BANDAGES/DRESSINGS) ×2
APL SKNCLS STERI-STRIP NONHPOA (GAUZE/BANDAGES/DRESSINGS) ×2
BENZOIN TINCTURE PRP APPL 2/3 (GAUZE/BANDAGES/DRESSINGS) ×4 IMPLANT
CANISTER SUCTION 2500CC (MISCELLANEOUS) ×4 IMPLANT
CATH THORACIC 28FR (CATHETERS) ×4 IMPLANT
CATH THORACIC 28FR RT ANG (CATHETERS) IMPLANT
CATH THORACIC 36FR (CATHETERS) ×4 IMPLANT
CATH THORACIC 36FR RT ANG (CATHETERS) IMPLANT
CLOSURE WOUND 1/2 X4 (GAUZE/BANDAGES/DRESSINGS) ×1
CONT SPEC 4OZ CLIKSEAL STRL BL (MISCELLANEOUS) ×3 IMPLANT
COVER SURGICAL LIGHT HANDLE (MISCELLANEOUS) ×4 IMPLANT
DERMABOND ADVANCED (GAUZE/BANDAGES/DRESSINGS) ×2
DERMABOND ADVANCED .7 DNX12 (GAUZE/BANDAGES/DRESSINGS) IMPLANT
DRAIN CHANNEL 28F RND 3/8 FF (WOUND CARE) ×2 IMPLANT
DRAIN CHANNEL 32F RND 10.7 FF (WOUND CARE) IMPLANT
DRAPE LAPAROSCOPIC ABDOMINAL (DRAPES) ×4 IMPLANT
DRAPE WARM FLUID 44X44 (DRAPE) IMPLANT
ELECT REM PT RETURN 9FT ADLT (ELECTROSURGICAL) ×4
ELECTRODE REM PT RTRN 9FT ADLT (ELECTROSURGICAL) ×2 IMPLANT
GAUZE SPONGE 4X4 16PLY XRAY LF (GAUZE/BANDAGES/DRESSINGS) ×4 IMPLANT
GLOVE EUDERMIC 7 POWDERFREE (GLOVE) ×8 IMPLANT
GOWN STRL REUS W/ TWL XL LVL3 (GOWN DISPOSABLE) ×2 IMPLANT
GOWN STRL REUS W/TWL XL LVL3 (GOWN DISPOSABLE) ×4
KIT BASIN OR (CUSTOM PROCEDURE TRAY) ×4 IMPLANT
KIT ROOM TURNOVER OR (KITS) ×4 IMPLANT
KIT SUCTION CATH 14FR (SUCTIONS) ×4 IMPLANT
NS IRRIG 1000ML POUR BTL (IV SOLUTION) ×4 IMPLANT
PACK CHEST (CUSTOM PROCEDURE TRAY) ×4 IMPLANT
PAD ARMBOARD 7.5X6 YLW CONV (MISCELLANEOUS) ×8 IMPLANT
PAD ELECT DEFIB RADIOL ZOLL (MISCELLANEOUS) ×4 IMPLANT
SPONGE GAUZE 4X4 12PLY (GAUZE/BANDAGES/DRESSINGS) ×8 IMPLANT
SPONGE GAUZE 4X4 12PLY STER LF (GAUZE/BANDAGES/DRESSINGS) ×2 IMPLANT
STAPLER VISISTAT 35W (STAPLE) IMPLANT
STRIP CLOSURE SKIN 1/2X4 (GAUZE/BANDAGES/DRESSINGS) ×3 IMPLANT
SUT SILK  1 MH (SUTURE) ×2
SUT SILK 1 MH (SUTURE) IMPLANT
SUT VIC AB 1 CTX 36 (SUTURE) ×4
SUT VIC AB 1 CTX36XBRD ANBCTR (SUTURE) ×2 IMPLANT
SUT VIC AB 2-0 CTX 36 (SUTURE) ×4 IMPLANT
SUT VIC AB 3-0 X1 27 (SUTURE) ×4 IMPLANT
SWAB COLLECTION DEVICE MRSA (MISCELLANEOUS) IMPLANT
SYR 30ML SLIP (SYRINGE) IMPLANT
SYRINGE 10CC LL (SYRINGE) IMPLANT
SYSTEM SAHARA CHEST DRAIN ATS (WOUND CARE) ×4 IMPLANT
TAPE CLOTH SURG 4X10 WHT LF (GAUZE/BANDAGES/DRESSINGS) ×2 IMPLANT
TOWEL OR 17X24 6PK STRL BLUE (TOWEL DISPOSABLE) ×4 IMPLANT
TOWEL OR 17X26 10 PK STRL BLUE (TOWEL DISPOSABLE) ×8 IMPLANT
TRAP SPECIMEN MUCOUS 40CC (MISCELLANEOUS) ×6 IMPLANT
TRAY FOLEY CATH 14FRSI W/METER (CATHETERS) ×4 IMPLANT
TUBE ANAEROBIC SPECIMEN COL (MISCELLANEOUS) IMPLANT
WATER STERILE IRR 1000ML POUR (IV SOLUTION) ×8 IMPLANT

## 2014-01-04 NOTE — Anesthesia Procedure Notes (Signed)
Procedure Name: Intubation Date/Time: 01/04/2014 1:22 PM Performed by: Judeth Cornfield T Pre-anesthesia Checklist: Timeout performed, Patient identified, Emergency Drugs available, Suction available and Patient being monitored Patient Re-evaluated:Patient Re-evaluated prior to inductionOxygen Delivery Method: Circle system utilized Preoxygenation: Pre-oxygenation with 100% oxygen Intubation Type: IV induction Ventilation: Mask ventilation without difficulty Laryngoscope Size: Miller and 2 Grade View: Grade I Tube type: Oral Tube size: 7.5 mm Number of attempts: 1 Airway Equipment and Method: Stylet Placement Confirmation: ETT inserted through vocal cords under direct vision,  breath sounds checked- equal and bilateral,  positive ETCO2 and CO2 detector Secured at: 22 cm Tube secured with: Tape Dental Injury: Teeth and Oropharynx as per pre-operative assessment

## 2014-01-04 NOTE — Brief Op Note (Signed)
      [address]       [place] [address]             [phone]     01/03/2014 - 01/04/2014  2:08 PM  PATIENT:  Linda Donovan  58 y.o. female  PRE-OPERATIVE DIAGNOSIS:  Pericardial effusion  POST-OPERATIVE DIAGNOSIS:  Pericardial effusion  PROCEDURE:  Procedure(s): SUBXYPHOID PERICARDIAL WINDOW INTRAOPERATIVE TRANSESOPHAGEAL ECHOCARDIOGRAM  SURGEON:  Surgeon(s): [name], MD [name], MD  PHYSICIAN ASSISTANT: Krista Som PA-C  ANESTHESIA:   general  PATIENT CONDITION:  PACU - hemodynamically stable.  PRE-OPERATIVE WEIGHT: 83KF  COMPLICATIONS: NO KNOWN  SPECIMEN: FLUID FOR CULTURE, CYTOLOGY,AND CHEMISTRIES

## 2014-01-04 NOTE — Anesthesia Postprocedure Evaluation (Signed)
Anesthesia Post Note  Patient: Linda Donovan  Procedure(s) Performed: Procedure(s) (LRB): SUBXYPHOID PERICARDIAL WINDOW (N/A) INTRAOPERATIVE TRANSESOPHAGEAL ECHOCARDIOGRAM (N/A)  Anesthesia type: General  Patient location: PACU  Post pain: Pain level controlled and Adequate analgesia  Post assessment: Post-op Vital signs reviewed, Patient's Cardiovascular Status Stable, Respiratory Function Stable, Patent Airway and Pain level controlled  Last Vitals:  Filed Vitals:   01/04/14 1521  BP: 131/81  Pulse: 92  Temp:   Resp: 16    Post vital signs: Reviewed and stable  Level of consciousness: awake, alert  and oriented  Complications: No apparent anesthesia complications

## 2014-01-04 NOTE — Transfer of Care (Signed)
Immediate Anesthesia Transfer of Care Note  Patient: Linda Donovan  Procedure(s) Performed: Procedure(s): SUBXYPHOID PERICARDIAL WINDOW (N/A) INTRAOPERATIVE TRANSESOPHAGEAL ECHOCARDIOGRAM (N/A)  Patient Location: PACU  Anesthesia Type:General  Level of Consciousness: alert , oriented, patient cooperative and responds to stimulation  Airway & Oxygen Therapy: Patient Spontanous Breathing and Patient connected to face mask oxygen  Post-op Assessment: Report given to PACU RN and Post -op Vital signs reviewed and stable  Post vital signs: Reviewed and stable  Complications: No apparent anesthesia complications

## 2014-01-04 NOTE — Progress Notes (Signed)
Subjective:   Linda Donovan is a 58 y.o. female with a history of COPD and non small cell lung CA found to have progressive pericardial effusion and tamponade. Pending pericardial window today  Denies Cp or dyspnea. Remains tachy.    PHYSICAL EXAM: VS:  BP 146/91  Pulse 119  Temp(Src) 97.8 F (36.6 C) (Oral)  Resp 17  Ht $R'5\' 3"'DK$  (1.6 m)  Wt 60.1 kg (132 lb 7.9 oz)  BMI 23.48 kg/m2  SpO2 98% Sitting straight up in bed. Mildly dyspneic appearing HEENT: normal Neck: JVP 7-8 (No Kussmaul's noted) Cardiac:  normal S1, S2; RRR; no murmurtachycardic Lungs:  Scattered rhonchi Abd: soft, nontender, no hepatomegaly Ext: no edema Skin: warm and dry Neuro:  CNs 2-12 intact, no focal abnormalities noted  EKG:  Sinus tachcyardia with PAC's at 107bpm and no ST changes     Tele: ST 100-110  Results for orders placed during the hospital encounter of 01/03/14 (from the past 72 hour(s))  TSH     Status: None   Collection Time    01/03/14  1:00 PM      Result Value Ref Range   TSH 0.913  0.350 - 4.500 uIU/mL  MRSA PCR SCREENING     Status: None   Collection Time    01/03/14  1:02 PM      Result Value Ref Range   MRSA by PCR NEGATIVE  NEGATIVE   Comment:            The GeneXpert MRSA Assay (FDA     approved for NASAL specimens     only), is one component of a     comprehensive MRSA colonization     surveillance program. It is not     intended to diagnose MRSA     infection nor to guide or     monitor treatment for     MRSA infections.  APTT     Status: None   Collection Time    01/03/14  1:38 PM      Result Value Ref Range   aPTT 33  24 - 37 seconds  CBC WITH DIFFERENTIAL     Status: Abnormal   Collection Time    01/03/14  1:38 PM      Result Value Ref Range   WBC 9.2  4.0 - 10.5 K/uL   RBC 4.15  3.87 - 5.11 MIL/uL   Hemoglobin 13.8  12.0 - 15.0 g/dL   HCT 41.2  36.0 - 46.0 %   MCV 99.3  78.0 - 100.0 fL   MCH 33.3  26.0 - 34.0 pg   MCHC 33.5  30.0 - 36.0 g/dL   RDW 12.6  11.5 - 15.5 %   Platelets 294  150 - 400 K/uL   Neutrophils Relative % 79 (*) 43 - 77 %   Neutro Abs 7.3  1.7 - 7.7 K/uL   Lymphocytes Relative 9 (*) 12 - 46 %   Lymphs Abs 0.8  0.7 - 4.0 K/uL   Monocytes Relative 10  3 - 12 %   Monocytes Absolute 0.9  0.1 - 1.0 K/uL   Eosinophils Relative 2  0 - 5 %   Eosinophils Absolute 0.1  0.0 - 0.7 K/uL   Basophils Relative 0  0 - 1 %   Basophils Absolute 0.0  0.0 - 0.1 K/uL  COMPREHENSIVE METABOLIC PANEL     Status: None   Collection Time    01/03/14  1:38  PM      Result Value Ref Range   Sodium 141  137 - 147 mEq/L   Potassium 3.9  3.7 - 5.3 mEq/L   Chloride 101  96 - 112 mEq/L   CO2 27  19 - 32 mEq/L   Glucose, Bld 89  70 - 99 mg/dL   BUN 7  6 - 23 mg/dL   Creatinine, Ser 0.58  0.50 - 1.10 mg/dL   Calcium 9.2  8.4 - 10.5 mg/dL   Total Protein 7.4  6.0 - 8.3 g/dL   Albumin 3.6  3.5 - 5.2 g/dL   AST 25  0 - 37 U/L   ALT 18  0 - 35 U/L   Alkaline Phosphatase 105  39 - 117 U/L   Total Bilirubin 0.3  0.3 - 1.2 mg/dL   GFR calc non Af Amer >90  >90 mL/min   GFR calc Af Amer >90  >90 mL/min   Comment: (NOTE)     The eGFR has been calculated using the CKD EPI equation.     This calculation has not been validated in all clinical situations.     eGFR's persistently <90 mL/min signify possible Chronic Kidney     Disease.   Anion gap 13  5 - 15  MAGNESIUM     Status: None   Collection Time    01/03/14  1:38 PM      Result Value Ref Range   Magnesium 1.6  1.5 - 2.5 mg/dL  PHOSPHORUS     Status: None   Collection Time    01/03/14  1:38 PM      Result Value Ref Range   Phosphorus 2.9  2.3 - 4.6 mg/dL  PRO B NATRIURETIC PEPTIDE     Status: Abnormal   Collection Time    01/03/14  1:38 PM      Result Value Ref Range   Pro B Natriuretic peptide (BNP) 149.8 (*) 0 - 125 pg/mL  PROTIME-INR     Status: None   Collection Time    01/03/14  1:38 PM      Result Value Ref Range   Prothrombin Time 13.3  11.6 - 15.2 seconds   INR 1.01   0.00 - 1.49  URINALYSIS, ROUTINE W REFLEX MICROSCOPIC     Status: None   Collection Time    01/03/14  3:17 PM      Result Value Ref Range   Color, Urine YELLOW  YELLOW   APPearance CLEAR  CLEAR   Specific Gravity, Urine 1.012  1.005 - 1.030   pH 8.0  5.0 - 8.0   Glucose, UA NEGATIVE  NEGATIVE mg/dL   Hgb urine dipstick NEGATIVE  NEGATIVE   Bilirubin Urine NEGATIVE  NEGATIVE   Ketones, ur NEGATIVE  NEGATIVE mg/dL   Protein, ur NEGATIVE  NEGATIVE mg/dL   Urobilinogen, UA 0.2  0.0 - 1.0 mg/dL   Nitrite NEGATIVE  NEGATIVE   Leukocytes, UA NEGATIVE  NEGATIVE   Comment: MICROSCOPIC NOT DONE ON URINES WITH NEGATIVE PROTEIN, BLOOD, LEUKOCYTES, NITRITE, OR GLUCOSE <1000 mg/dL.  TYPE AND SCREEN     Status: None   Collection Time    01/03/14  4:20 PM      Result Value Ref Range   ABO/RH(D) A POS     Antibody Screen NEG     Sample Expiration 01/06/2014    ABO/RH     Status: None   Collection Time    01/03/14  4:20 PM  Result Value Ref Range   ABO/RH(D) A POS    POCT I-STAT 3, ART BLOOD GAS (G3+)     Status: Abnormal   Collection Time    01/03/14  4:35 PM      Result Value Ref Range   pH, Arterial 7.430  7.350 - 7.450   pCO2 arterial 40.9  35.0 - 45.0 mmHg   pO2, Arterial 68.0 (*) 80.0 - 100.0 mmHg   Bicarbonate 27.2 (*) 20.0 - 24.0 mEq/L   TCO2 28  0 - 100 mmol/L   O2 Saturation 94.0     Acid-Base Excess 3.0 (*) 0.0 - 2.0 mmol/L   Patient temperature 98.6 F     Collection site RADIAL, ALLEN'S TEST ACCEPTABLE     Drawn by RT     Sample type ARTERIAL    SURGICAL PCR SCREEN     Status: None   Collection Time    01/03/14 11:38 PM      Result Value Ref Range   MRSA, PCR NEGATIVE  NEGATIVE   Staphylococcus aureus NEGATIVE  NEGATIVE   Comment:            The Xpert SA Assay (FDA     approved for NASAL specimens     in patients over 17 years of age),     is one component of     a comprehensive surveillance     program.  Test performance has     been validated by [employer] for patients greater     than or equal to 40 year old.     It is not intended     to diagnose infection nor to     guide or monitor treatment.  BASIC METABOLIC PANEL     Status: Abnormal   Collection Time    01/04/14  3:19 AM      Result Value Ref Range   Sodium 141  137 - 147 mEq/L   Potassium 4.2  3.7 - 5.3 mEq/L   Chloride 102  96 - 112 mEq/L   CO2 28  19 - 32 mEq/L   Glucose, Bld 94  70 - 99 mg/dL   BUN 5 (*) 6 - 23 mg/dL   Creatinine, Ser 0.58  0.50 - 1.10 mg/dL   Calcium 8.8  8.4 - 10.5 mg/dL   GFR calc non Af Amer >90  >90 mL/min   GFR calc Af Amer >90  >90 mL/min   Comment: (NOTE)     The eGFR has been calculated using the CKD EPI equation.     This calculation has not been validated in all clinical situations.     eGFR's persistently <90 mL/min signify possible Chronic Kidney     Disease.   Anion gap 11  5 - 15  CBC     Status: None   Collection Time    01/04/14  3:19 AM      Result Value Ref Range   WBC 7.2  4.0 - 10.5 K/uL   RBC 3.94  3.87 - 5.11 MIL/uL   Hemoglobin 12.9  12.0 - 15.0 g/dL   HCT 39.2  36.0 - 46.0 %   MCV 99.5  78.0 - 100.0 fL   MCH 32.7  26.0 - 34.0 pg   MCHC 32.9  30.0 - 36.0 g/dL   RDW 12.5  11.5 - 15.5 %   Platelets 252  150 - 400 K/uL  MAGNESIUM     Status: None  Collection Time    01/04/14  3:19 AM      Result Value Ref Range   Magnesium 1.5  1.5 - 2.5 mg/dL    ASSESSMENT AND PLAN:  1. Large pericardial effusion wit tamponade physiology 2. Mild sinus tachycardia related to #1 3. COPD 4. 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 Hypomagnesemia  For pericardial window today -suspect malignant effusion. Will supp magnesium.   Benay Spice 9:31 AM

## 2014-01-04 NOTE — Anesthesia Preprocedure Evaluation (Signed)
Anesthesia Evaluation  Patient identified by MRN, date of birth, ID band Patient awake    Reviewed: Allergy & Precautions, H&P , NPO status , Patient's Chart, lab work & pertinent test results  Airway Mallampati: II  Neck ROM: full    Dental   Pulmonary shortness of breath, asthma , COPDformer smoker,  H/o RUL lung CA         Cardiovascular negative cardio ROS      Neuro/Psych    GI/Hepatic   Endo/Other    Renal/GU      Musculoskeletal  (+) Arthritis -,   Abdominal   Peds  Hematology   Anesthesia Other Findings   Reproductive/Obstetrics                           Anesthesia Physical Anesthesia Plan  ASA: III  Anesthesia Plan: General   Post-op Pain Management:    Induction: Intravenous  Airway Management Planned: Oral ETT  Additional Equipment: Arterial line  Intra-op Plan:   Post-operative Plan: Extubation in OR  Informed Consent: I have reviewed the patients History and Physical, chart, labs and discussed the procedure including the risks, benefits and alternatives for the proposed anesthesia with the patient or authorized representative who has indicated his/her understanding and acceptance.     Plan Discussed with: CRNA, Anesthesiologist and Surgeon  Anesthesia Plan Comments:         Anesthesia Quick Evaluation

## 2014-01-04 NOTE — Progress Notes (Deleted)
  Echocardiogram 2D Echocardiogram has been performed.  Mauricio Po 01/04/2014, 2:00 PM

## 2014-01-04 NOTE — H&P (View-Only) (Signed)
[address]       [place] [address]             [phone]        Annabeth M Klich Octa Medical Record #[id number] Date of Birth: 1955-12-18  Referring: No ref. 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 She completed radiation therapy. She is undergoing ongoing chemotherapy under the direction of Dr. Julien Nordmann. At her last clinic visit a followup CT scan of the chest was performed which showed her malignancy to be stable however she had a moderate pericardial effusion. Over the past 2 weeks she has noticed increased dyspnea with exertion but not at rest. She is been using oxygen 2 L per minute at home for her COPD. She was seen in the cardiology office today by Dr. Radford Pax and an echocardiogram demonstrated a 2.5 cm pericardial effusion. There was abnormal transmitral flow however no definite sign of cardiac tamponade. Review of her most recent chest CT scan shows no significant pleural effusion with a stable mass in the posterior segment right upper lobe with some surrounding atelectasis. There is significant subcarinal adenopathy. She has not had a Port-A-Cath. She's had no significant untoward reaction her chemotherapy and has not required hospitalization. Her functional status is fairly good and she is maintaining weight and overall strength.  There is no history of heart disease. She states her baseline heart rate remains high. Heart rate currently is 108 per minute period heart rate at her clinic visit with Dr. Julien Nordmann in early July was 97. Blood pressure is been stable.  Current Activity/ Functional Status: Was with her husband. Last chemotherapy treatment by month ago. No recent COPD  flareups or requirement for antibiotics-steroids.   Zubrod Score: At the time of surgery this patient's most appropriate activity status/level should be described as: []     0    Normal activity, no symptoms []     1    Restricted in physical strenuous activity but ambulatory, able to do out light work [x]     2    Ambulatory and capable of self care, unable to do work activities, up and about                 more than 50%  Of the time                            []     3    Only limited self care, in bed greater than 50% of waking hours []     4    Completely disabled, no self care, confined to bed or chair []     5    Moribund  Past Medical History  Diagnosis Date  . COPD (chronic obstructive pulmonary disease)   . ADD (attention deficit disorder)   . Hx of radiation therapy 09/28/12- 11/17/12    RU lung mass, mediastinum chest, 63 gray 35 fx  . Non-small cell lung cancer 09/18/2012    RUL    Past Surgical History  Procedure Laterality Date  . Total abdominal hysterectomy    . Shoulder arthroscopy    . Video bronchoscopy Bilateral 07/25/2012  Procedure: VIDEO BRONCHOSCOPY WITH FLUORO;  Surgeon: Tanda Rockers, MD;  Location: [facility];  Service: Endoscopy;  Laterality: Bilateral;  . Lump removed from breasr right  2003  . Endobronchial ultrasound Bilateral 09/04/2012    Procedure: ENDOBRONCHIAL ULTRASOUND;  Surgeon: Collene Gobble, MD;  Location: [facility];  Service: Cardiopulmonary;  Laterality: Bilateral;    History  Smoking status  . Former Smoker -- 1.00 packs/day for 20 years  . Types: Cigarettes  . Quit date: 06/07/1996  Smokeless tobacco  . Never Used    History  Alcohol Use  . Yes    History   Social History  . Marital Status: Married    Spouse Name: N/A    Number of Children: N/A  . Years of Education: N/A   Occupational History  . Not on file.   Social History Main Topics  . Smoking status: Former Smoker -- 1.00 packs/day for 20 years    Types:  Cigarettes    Quit date: 06/07/1996  . Smokeless tobacco: Never Used  . Alcohol Use: Yes  . Drug Use: No  . Sexual Activity: Not on file   Other Topics Concern  . Not on file   Social History Narrative  . No narrative on file    Allergies  Allergen Reactions  . Clinoril [Sulindac] Hives  . Morphine Itching    Current Facility-Administered Medications  Medication Dose Route Frequency Provider Last Rate Last Dose  . 0.9 %  sodium chloride infusion  250 mL Intravenous PRN Sueanne Margarita, MD      . cefUROXime (ZINACEF) 1.5 g in dextrose 5 % 50 mL IVPB  1.5 g Intravenous 60 min Pre-Op Ivin Poot, MD      . dextrose 5 %-0.9 % sodium chloride infusion   Intravenous Continuous Ivin Poot, MD      . sodium chloride 0.9 % injection 3 mL  3 mL Intravenous Q12H Traci R Turner, MD      . sodium chloride 0.9 % injection 3 mL  3 mL Intravenous PRN Sueanne Margarita, MD        Prescriptions prior to admission  Medication Sig Dispense Refill  . albuterol (PROVENTIL HFA;VENTOLIN HFA) 108 (90 BASE) MCG/ACT inhaler Inhale 2 puffs into the lungs every 6 (six) hours as needed for wheezing.  8.5 g  5  . albuterol (PROVENTIL) (2.5 MG/3ML) 0.083% nebulizer solution Take 2.5 mg by nebulization every 6 (six) hours as needed for wheezing or shortness of breath.      . amphetamine-dextroamphetamine (ADDERALL) 20 MG tablet Take 20 mg by mouth daily before breakfast.       . aspirin 81 MG tablet Take 81 mg by mouth daily.      . budesonide-formoterol (SYMBICORT) 160-4.5 MCG/ACT inhaler Inhale 2 puffs into the lungs 2 (two) times daily.  1 Inhaler  0  . Calcium Carb-Ergocalciferol 250-125 MG-UNIT TABS Take 1 tablet by mouth daily.       . calcium-vitamin D (OSCAL WITH D) 250-125 MG-UNIT per tablet Take 1 tablet by mouth daily.      Marland Kitchen loratadine (CLARITIN) 10 MG tablet Take 10 mg by mouth daily as needed.       . magnesium oxide (MAG-OX) 400 MG tablet Take 1 tablet (400 mg total) by mouth daily.  60  tablet  2  . Multiple Vitamins-Minerals (MULTIVITAMIN WITH MINERALS) tablet Take 1 tablet by mouth daily.      . traZODone (DESYREL) 50 MG tablet Take  50 mg by mouth at bedtime.      Marland Kitchen zinc gluconate 50 MG tablet Take 50 mg by mouth daily.        Family History  Problem Relation Age of Onset  . COPD Paternal Uncle   . Heart disease Mother   . Heart disease Maternal Grandmother   . Cancer Paternal Grandmother     breast  . Prostate cancer Maternal Grandfather   . Cancer Maternal Grandfather     prostate     Review of Systems: The patient is right-hand dominant                                     The patient had a previous right breast biopsy which was negative    Cardiac Review of Systems: Y or N  Chest Pain [  no no]    Palpitations [place]er  ] Syncope  no  ]   Presyncope no [   ]  General Review of Systems: [Y] = yes [  ]=no Constitional: recent weight change [  ]; anorexia [  ]; fatigue [  ]; nausea [  ]; night sweats [  ]; fever [  ]; or chills [  ]                                                               Dental: poor dentition[  ]; Last Dentist visit:   Eye : blurred vision [  ]; diplopia [   ]; vision changes [  ];  Amaurosis fugax[  ]; Resp: cough [  ];  wheezing[  ];  hemoptysis[  ]; shortness of breath[yes  ]; paroxysmal nocturnal dyspnea[  ]; dyspnea on exertion[S.  ]; or orthopnea[yes  ];  GI:  gallstones[  ], vomiting[  ];  dysphagia[  ]; melena[  ];  hematochezia [  ]; heartburn[  ];   Hx of  Colonoscopy[  ]; GU: kidney stones [  ]; hematuria[  ];   dysuria [  ];  nocturia[  ];  history of     obstruction [  ]; urinary frequency [  ]             Skin: rash, swelling[  ];, hair loss[  ];  peripheral edema[  ];  or itching[  ]; Musculosketetal: myalgias[  ];  joint swelling[  ];  joint erythema[  ];  joint pain[  ];  back pain[  ];  Heme/Lymph: bruising[  ];  bleeding[  ];  anemia[  ];  Neuro: TIA[  ];  headaches[  ];  stroke[  ];  vertigo[  ];  seizures[  ];    paresthesias[  ];  difficulty walking[  ];  Psych:depression[  ]; anxiety[  ];  Endocrine: diabetes[no  ];  thyroid dysfunction[  ];  Immunizations: Flu [  ]; Pneumococcal[  ];  Other:  Physical Exam: BP 140/78  Temp(Src) 98 F (36.7 C) (Oral)  Ht 5\' 3"  (1.6 m)  Wt 133 lb 13.1 oz (60.7 kg)  BMI 23.71 kg/m2  SpO2 96%  Exam  General appearance-Lang been comfortable alert and oriented company by husband HEENT-pupils equal neck without JVD mass or adenopathy  good dentition Lungs-scattered rhonchi wheeze right lung, left lung breath sounds distant Cardiac-increased heart rate, no rub or gallop or murmur Abdomen-nontender Extremities-no clubbing cyanosis or tenderness, no edema Vascular-no calf tenderness, strong pedal pulses present Neurologic-no local motor deficit   Diagnostic Studies & Laboratory data:     Recent Radiology Findings:   Dg Chest Port 1 View  01/03/2014   CLINICAL DATA:  History of lung carcinoma; COPD  EXAM: PORTABLE CHEST - 1 VIEW  COMPARISON:  Chest radiograph March 08, 2013; chest CT December 05, 2013  FINDINGS: The area of mass/consolidation in the right upper lobe appears less pronounced compared to recent CT examination. There is still patchy opacity in this area.  There is underlying emphysema. Mild interstitial prominence in the bases may in part reflect redistribution of blood flow to a viable segments of lung. There is no frank edema or consolidation apart from the opacity in the right upper lobe. The cardiac silhouette is enlarged with pulmonary vascularity reflecting underlying emphysema. Some of the apparent cardiac enlargement may be due to pericardial effusion based on appearance on recent CT. No adenopathy is appreciable radiographically. There are no bone lesions.  IMPRESSION: Less opacity is noted in the right upper lobe compared to recent CT. There is still some patchy opacity in this area, and there may well be mass with postobstructive pneumonitis in  this area. Disease has not progressed in this area compared to most recent prior study. There is no new opacity. There is underlying emphysema with cardiomegaly ; the cardiomegaly in part may be due to pericardial effusion. Recent CT shows sizable pericardial effusion. The previously noted adenopathy, particularly in the sub- carinal region, is not appreciable on radiographic examination. CT is more sensitive for adenopathy than is radiography.   Electronically Signed   By: Lowella Grip M.D.   On: 01/03/2014 14:09      Recent Lab Findings: Lab Results  Component Value Date   WBC 9.2 01/03/2014   HGB 13.8 01/03/2014   HCT 41.2 01/03/2014   PLT 294 01/03/2014   GLUCOSE 89 01/03/2014   ALT 18 01/03/2014   AST 25 01/03/2014   NA 141 01/03/2014   K 3.9 01/03/2014   CL 101 01/03/2014   CREATININE 0.58 01/03/2014   BUN 7 01/03/2014   CO2 27 01/03/2014   TSH 0.913 01/03/2014   INR 1.01 01/03/2014      Assessment / Plan:      Progressive increase in pericardial effusion over the past 3 weeks associated with dyspnea exertion. The patient would benefit from a subxiphoid pericardial window. The procedure is been explained to her in detail and will be scheduled for tomorrow by Dr. Roxan Hockey.      @ME1 @ 01/03/2014 2:49 PM

## 2014-01-04 NOTE — Progress Notes (Signed)
  Echocardiogram Echocardiogram Transesophageal has been performed.  Linda Donovan 01/04/2014, 2:01 PM

## 2014-01-04 NOTE — Op Note (Signed)
NAMELOWEN, MANSOURI                ACCOUNT NO.:  [id number]  MEDICAL RECORD NO.:  [id number]  LOCATION:  [facility]                         FACILITY:  [facility]  PHYSICIAN:  Revonda Standard. Roxan Hockey, M.D.DATE OF BIRTH:  November 27, 1955  DATE OF PROCEDURE:  01/04/2014 DATE OF DISCHARGE:                              OPERATIVE REPORT   PREOPERATIVE DIAGNOSIS:  Pericardial effusion.  POSTOPERATIVE DIAGNOSIS:  Pericardial effusion.  PROCEDURE:  Subxiphoid pericardial window.  SURGEON:  Revonda Standard. Roxan Hockey, M.D.  ASSISTANT:  John Giovanni, P.A.-C.  ANESTHESIA:  General.  FINDINGS:  400 mL of clear serous fluid evacuated, mild inflammatory changes on pericardial and epicardial surface.  No tumor seen. Pericardial effusion resolved post drainage by TEE.  CLINICAL NOTE:  Ms. Mcanelly is a 58 year old woman with a history of stage IIIB lung cancer and a known pericardial effusion.  She recently has noted increasing shortness of breath with exertion.  She was noted to have a resting tachycardia.  A repeat echocardiogram showed an increased size of the pericardial effusion with early signs of tamponade, but no frank cardiac tamponade.  She was admitted to the hospital and advised to undergo pericardial window for drainage of the pericardial effusion for both diagnostic and therapeutic purposes.  The indications, risks, benefits, and alternatives were discussed in detail with the patient.  She understood and accepted the risks and agreed to proceed.  OPERATIVE NOTE:  Ms. Paugh was brought to the operating room on January 04, 2014.  In the preoperative holding area, Anesthesia placed an arterial blood pressure monitoring line.  She was taken to the operating room, anesthetized, and intubated.  She remained hemodynamically stable throughout.  Transesophageal echocardiography was performed.  It revealed a large pericardial effusion with no frank tamponade.  There were pleural effusions noted as well.  The  chest and abdomen were prepped and draped in usual sterile fashion. An incision was made over the xiphoid process.  It was carried through the skin and subcutaneous tissue.  The rectus muscles were dissected off the xiphoid, a clamp was placed on the xiphoid and it was elevated.  The fatty tissue underneath was dissected off. The pericardium was identified and incised.  Clear yellow serous fluid was evacuated.  A portion of the fluid was sent for cytology.  A second specimen was sent for cultures.  The third was sent for cell count and differential, protein, glucose, and LDH levels.  Approximately 400 mL of fluid was drained in all.  A portion of the pericardium was excised, approximately 1.5 cm2.  A sucker then probed throughout the pericardium to make sure that there were no undrained loculations.  Transesophageal echocardiography revealed no significant residual effusion.  A 28-French Blake drain was placed through a second incision and into the pericardium.  It was secured with a #1 silk.  The rectus fascia was closed with a #1 Vicryl suture. The subcutaneous tissue and skin were closed in standard fashion.  All sponge, needle, and instrument counts were correct at the end of the procedure.  The patient was taken from the operating room to the postanesthetic care unit in good condition.     Revonda Standard Roxan Hockey, M.D.  SCH/MEDQ  D:  01/04/2014  T:  01/04/2014  Job:  [id number]

## 2014-01-04 NOTE — Interval H&P Note (Signed)
History and Physical Interval Note: Patient seen and examined. Chart and studies reviewed. 58 yo with Stage 3B lung cancer presents with an enlarging pericardial effusion and early tamponade. She is tachycardic but not hypotensive Plan subxiphoid pericardial window. She is aware of the risks and benefits and wishes to proceed All questions have been answered  01/04/2014 12:09 PM  Linda Donovan  has presented today for surgery, with the diagnosis of PERICARDIAL EFFUSION  The various methods of treatment have been discussed with the patient and family. After consideration of risks, benefits and other options for treatment, the patient has consented to  Procedure(s): SUBXYPHOID PERICARDIAL WINDOW (N/A) INTRAOPERATIVE TRANSESOPHAGEAL ECHOCARDIOGRAM (N/A) as a surgical intervention .  The patient's history has been reviewed, patient examined, no change in status, stable for surgery.  I have reviewed the patient's chart and labs.  Questions were answered to the patient's satisfaction.     [name]

## 2014-01-05 ENCOUNTER — Inpatient Hospital Stay (HOSPITAL_COMMUNITY): Payer: BC Managed Care – PPO

## 2014-01-05 LAB — BLOOD GAS, ARTERIAL
Acid-Base Excess: 2.9 mmol/L — ABNORMAL HIGH (ref 0.0–2.0)
Bicarbonate: 28 mEq/L — ABNORMAL HIGH (ref 20.0–24.0)
DRAWN BY: 40415
O2 CONTENT: 3 L/min
O2 SAT: 96.8 %
PATIENT TEMPERATURE: 99
TCO2: 29.6 mmol/L (ref 0–100)
pCO2 arterial: 52.4 mmHg — ABNORMAL HIGH (ref 35.0–45.0)
pH, Arterial: 7.349 — ABNORMAL LOW (ref 7.350–7.450)
pO2, Arterial: 92.6 mmHg (ref 80.0–100.0)

## 2014-01-05 LAB — CBC
HCT: 39.6 % (ref 36.0–46.0)
HEMOGLOBIN: 13.3 g/dL (ref 12.0–15.0)
MCH: 33.3 pg (ref 26.0–34.0)
MCHC: 33.6 g/dL (ref 30.0–36.0)
MCV: 99.2 fL (ref 78.0–100.0)
Platelets: 238 10*3/uL (ref 150–400)
RBC: 3.99 MIL/uL (ref 3.87–5.11)
RDW: 12.5 % (ref 11.5–15.5)
WBC: 9.9 10*3/uL (ref 4.0–10.5)

## 2014-01-05 LAB — BASIC METABOLIC PANEL
Anion gap: 10 (ref 5–15)
BUN: 4 mg/dL — ABNORMAL LOW (ref 6–23)
CO2: 26 mEq/L (ref 19–32)
CREATININE: 0.48 mg/dL — AB (ref 0.50–1.10)
Calcium: 8 mg/dL — ABNORMAL LOW (ref 8.4–10.5)
Chloride: 101 mEq/L (ref 96–112)
GFR calc non Af Amer: >90 mL/min (ref >90)
Glucose, Bld: 110 mg/dL — ABNORMAL HIGH (ref 70–99)
Potassium: 4.2 mEq/L (ref 3.7–5.3)
Sodium: 137 mEq/L (ref 137–147)

## 2014-01-05 LAB — PROTEIN, PERICARDIAL FLUID: Protein, Pericardial Fluid: 4.9 g/dL

## 2014-01-05 NOTE — Progress Notes (Addendum)
[address]       [place] [address]             [phone]      TCTS DAILY ICU PROGRESS NOTE                   [facility]            ,[place] [address]          [phone]   1 Day Post-Op Procedure(s) (LRB): SUBXYPHOID PERICARDIAL WINDOW (N/A) INTRAOPERATIVE TRANSESOPHAGEAL ECHOCARDIOGRAM (N/A)  Total Length of Stay:  LOS: 2 days   Subjective: Feels ok, less SOB, pain controlled  Objective: Vital signs in last 24 hours: Temp:  [97.2 F (36.2 C)-99.3 F (37.4 C)] 98.9 F (37.2 C) (08/01 0758) Pulse Rate:  [87-114] 112 (08/01 0800) Cardiac Rhythm:  [-] Sinus tachycardia (08/01 0800) Resp:  [13-22] 20 (08/01 0800) BP: (93-141)/(50-98) 99/58 mmHg (08/01 0800) SpO2:  [92 %-100 %] 97 % (08/01 0800) Arterial Line BP: (101-184)/(60-88) 132/68 mmHg (08/01 0800) Weight:  [134 lb 0.6 oz (60.8 kg)] 134 lb 0.6 oz (60.8 kg) (08/01 0400)  Filed Weights   01/03/14 1254 01/04/14 0600 01/05/14 0400  Weight: 133 lb 13.1 oz (60.7 kg) 132 lb 7.9 oz (60.1 kg) 134 lb 0.6 oz (60.8 kg)    Weight change: 3.5 oz (0.1 kg)   Hemodynamic parameters for last 24 hours:    Intake/Output from previous day: 07/31 0701 - 08/01 0700 In: 3661.5 [P.O.:1100; I.V.:2411.5; IV Piggyback:150] Out: 2103 [Urine:1755; Chest Tube:348]  Intake/Output this shift: Total I/O In: 390 [P.O.:240; I.V.:150] Out: 150 [Urine:150]  Current Meds: Scheduled Meds: . acetaminophen  1,000 mg Oral 4 times per day   Or  . acetaminophen (TYLENOL) oral liquid 160 mg/5 mL  1,000 mg Oral 4 times per day  . amphetamine-dextroamphetamine  20 mg Oral QAC breakfast  . antiseptic oral rinse  15 mL Mouth Rinse BID  . bisacodyl  10 mg Oral Daily  . budesonide-formoterol  2 puff Inhalation BID  . cefUROXime (ZINACEF)  IV  1.5 g Intravenous Q12H  . fentaNYL   Intravenous 6 times per day  . senna-docusate  1 tablet Oral QHS  . traZODone  50 mg Oral QHS   Continuous  Infusions: . dextrose 5 % and 0.9% NaCl 75 mL/hr at 01/05/14 0700   PRN Meds:.albuterol, diphenhydrAMINE, diphenhydrAMINE, loratadine, naloxone, ondansetron (ZOFRAN) IV, potassium chloride, sodium chloride, traMADol  General appearance: alert, cooperative and no distress Heart: regular rate and rhythm and no rub Lungs: clear to auscultation bilaterally Abdomen: benign Wound: intact  Lab Results: CBC: Recent Labs  01/04/14 0319 01/05/14 0410  WBC 7.2 9.9  HGB 12.9 13.3  HCT 39.2 39.6  PLT 252 238   BMET:  Recent Labs  01/04/14 0319 01/05/14 0410  NA 141 137  K 4.2 4.2  CL 102 101  CO2 28 26  GLUCOSE 94 110*  BUN 5* 4*  CREATININE 0.58 0.48*  CALCIUM 8.8 8.0*    PT/INR:  Recent Labs  01/03/14 1338  LABPROT 13.3  INR 1.01   Radiology: Dg Chest Port 1 View  01/05/2014   CLINICAL DATA:  Pericardial drains  EXAM: PORTABLE CHEST - 1 VIEW  COMPARISON:  Prior chest x-ray 01/04/2014  FINDINGS: Stable position of pericardial drain. No mediastinal air or pneumothorax. Background changes of advanced emphysema. Similar right upper lobe pulmonary mass with adjacent bulla and architectural distortion. No new  airspace opacity. No acute osseous abnormality.  IMPRESSION: 1. Stable position of the pericardial drain. 2. No acute abnormality.  Stable appearance of the lungs.   Electronically Signed   By: Jacqulynn Cadet M.D.   On: 01/05/2014 08:01   Dg Chest Port 1 View  01/04/2014   CLINICAL DATA:  Status post pericardial window procedure.  EXAM: PORTABLE CHEST - 1 VIEW  COMPARISON:  01/03/2014 Chest x-ray and 12/05/2013 Chest CT.  FINDINGS: Stable radiation changes involving the right lung with loss of volume. The left lung is clear. The cardiopericardial silhouette is normal. A pericardial drainage catheter is in place.  IMPRESSION: Status post pericardial drainage procedure with a drainage catheter in place. The cardiopericardial silhouette is now normal.  Stable radiation changes  involving the right lung.   Electronically Signed   By: Kalman Jewels M.D.   On: 01/04/2014 15:05   Dg Chest Port 1 View  01/03/2014   CLINICAL DATA:  History of lung carcinoma; COPD  EXAM: PORTABLE CHEST - 1 VIEW  COMPARISON:  Chest radiograph March 08, 2013; chest CT December 05, 2013  FINDINGS: The area of mass/consolidation in the right upper lobe appears less pronounced compared to recent CT examination. There is still patchy opacity in this area.  There is underlying emphysema. Mild interstitial prominence in the bases may in part reflect redistribution of blood flow to a viable segments of lung. There is no frank edema or consolidation apart from the opacity in the right upper lobe. The cardiac silhouette is enlarged with pulmonary vascularity reflecting underlying emphysema. Some of the apparent cardiac enlargement may be due to pericardial effusion based on appearance on recent CT. No adenopathy is appreciable radiographically. There are no bone lesions.  IMPRESSION: Less opacity is noted in the right upper lobe compared to recent CT. There is still some patchy opacity in this area, and there may well be mass with postobstructive pneumonitis in this area. Disease has not progressed in this area compared to most recent prior study. There is no new opacity. There is underlying emphysema with cardiomegaly ; the cardiomegaly in part may be due to pericardial effusion. Recent CT shows sizable pericardial effusion. The previously noted adenopathy, particularly in the sub- carinal region, is not appreciable on radiographic examination. CT is more sensitive for adenopathy than is radiography.   Electronically Signed   By: Lowella Grip M.D.   On: 01/03/2014 14:09     Assessment/Plan: S/P Procedure(s) (LRB): SUBXYPHOID PERICARDIAL WINDOW (N/A) INTRAOPERATIVE TRANSESOPHAGEAL ECHOCARDIOGRAM (N/A)  1 doing well 2 keep tube for now- 348 cc recorded 3 d/c aline 4 d/c ivf but keep pca for now 5 d/c  foley     GOLD,WAYNE E 01/05/2014 9:27 AM Patient seen and examined, agree with above Keep CT in place- will change it water seal, dc when drainage down

## 2014-01-05 NOTE — Progress Notes (Signed)
Patient ID: Linda Donovan, female   DOB: 1955-08-23, 58 y.o.   MRN: [id number]   SUBJECTIVE: Status post pericardial window yesterday, feels much better.  Much less dyspnea.  SBP 120s-130s by arterial line.    Scheduled Meds: . acetaminophen  1,000 mg Oral 4 times per day   Or  . acetaminophen (TYLENOL) oral liquid 160 mg/5 mL  1,000 mg Oral 4 times per day  . amphetamine-dextroamphetamine  20 mg Oral QAC breakfast  . antiseptic oral rinse  15 mL Mouth Rinse BID  . bisacodyl  10 mg Oral Daily  . budesonide-formoterol  2 puff Inhalation BID  . cefUROXime (ZINACEF)  IV  1.5 g Intravenous Q12H  . fentaNYL   Intravenous 6 times per day  . senna-docusate  1 tablet Oral QHS  . traZODone  50 mg Oral QHS   Continuous Infusions: . dextrose 5 % and 0.9% NaCl 75 mL/hr at 01/05/14 0417   PRN Meds:.albuterol, diphenhydrAMINE, diphenhydrAMINE, loratadine, naloxone, ondansetron (ZOFRAN) IV, potassium chloride, sodium chloride, traMADol    Filed Vitals:   01/05/14 0500 01/05/14 0600 01/05/14 0700 01/05/14 0752  BP: 103/62 93/50 95/66    Pulse: 104 104 105   Temp:      TempSrc:      Resp: 15 13 16    Height:      Weight:      SpO2: 96% 97% 93% 98%    Intake/Output Summary (Last 24 hours) at 01/05/14 0758 Last data filed at 01/05/14 0700  Gross per 24 hour  Intake 3661.5 ml  Output   2103 ml  Net 1558.5 ml    LABS: Basic Metabolic Panel:  Recent Labs  01/03/14 1338 01/04/14 0319 01/05/14 0410  NA 141 141 137  K 3.9 4.2 4.2  CL 101 102 101  CO2 27 28 26   GLUCOSE 89 94 110*  BUN 7 5* 4*  CREATININE 0.58 0.58 0.48*  CALCIUM 9.2 8.8 8.0*  MG 1.6 1.5  --   PHOS 2.9  --   --    Liver Function Tests:  Recent Labs  01/03/14 1338  AST 25  ALT 18  ALKPHOS 105  BILITOT 0.3  PROT 7.4  ALBUMIN 3.6   No results found for this basename: LIPASE, AMYLASE,  in the last 72 hours CBC:  Recent Labs  01/03/14 1338 01/04/14 0319 01/05/14 0410  WBC 9.2 7.2 9.9  NEUTROABS 7.3   --   --   HGB 13.8 12.9 13.3  HCT 41.2 39.2 39.6  MCV 99.3 99.5 99.2  PLT 294 252 238   Cardiac Enzymes: No results found for this basename: CKTOTAL, CKMB, CKMBINDEX, TROPONINI,  in the last 72 hours BNP: No components found with this basename: POCBNP,  D-Dimer: No results found for this basename: DDIMER,  in the last 72 hours Hemoglobin A1C: No results found for this basename: HGBA1C,  in the last 72 hours Fasting Lipid Panel: No results found for this basename: CHOL, HDL, LDLCALC, TRIG, CHOLHDL, LDLDIRECT,  in the last 72 hours Thyroid Function Tests:  Recent Labs  01/03/14 1300  TSH 0.913   Anemia Panel: No results found for this basename: VITAMINB12, FOLATE, FERRITIN, TIBC, IRON, RETICCTPCT,  in the last 72 hours  RADIOLOGY: Dg Chest Port 1 View  01/04/2014   CLINICAL DATA:  Status post pericardial window procedure.  EXAM: PORTABLE CHEST - 1 VIEW  COMPARISON:  01/03/2014 Chest x-ray and 12/05/2013 Chest CT.  FINDINGS: Stable radiation changes involving the right lung with loss  of volume. The left lung is clear. The cardiopericardial silhouette is normal. A pericardial drainage catheter is in place.  IMPRESSION: Status post pericardial drainage procedure with a drainage catheter in place. The cardiopericardial silhouette is now normal.  Stable radiation changes involving the right lung.   Electronically Signed   By: Kalman Jewels M.D.   On: 01/04/2014 15:05   Dg Chest Port 1 View  01/03/2014   CLINICAL DATA:  History of lung carcinoma; COPD  EXAM: PORTABLE CHEST - 1 VIEW  COMPARISON:  Chest radiograph March 08, 2013; chest CT December 05, 2013  FINDINGS: The area of mass/consolidation in the right upper lobe appears less pronounced compared to recent CT examination. There is still patchy opacity in this area.  There is underlying emphysema. Mild interstitial prominence in the bases may in part reflect redistribution of blood flow to a viable segments of lung. There is no frank edema  or consolidation apart from the opacity in the right upper lobe. The cardiac silhouette is enlarged with pulmonary vascularity reflecting underlying emphysema. Some of the apparent cardiac enlargement may be due to pericardial effusion based on appearance on recent CT. No adenopathy is appreciable radiographically. There are no bone lesions.  IMPRESSION: Less opacity is noted in the right upper lobe compared to recent CT. There is still some patchy opacity in this area, and there may well be mass with postobstructive pneumonitis in this area. Disease has not progressed in this area compared to most recent prior study. There is no new opacity. There is underlying emphysema with cardiomegaly ; the cardiomegaly in part may be due to pericardial effusion. Recent CT shows sizable pericardial effusion. The previously noted adenopathy, particularly in the sub- carinal region, is not appreciable on radiographic examination. CT is more sensitive for adenopathy than is radiography.   Electronically Signed   By: Lowella Grip M.D.   On: 01/03/2014 14:09    PHYSICAL EXAM General: NAD Neck: No JVD, no thyromegaly or thyroid nodule.  Lungs: Decreased breath sounds bilaterally CV: Nondisplaced PMI.  Somewhat distant heart sounds, heart regular S1/S2, no S3/S4, no murmur.  No peripheral edema.   Abdomen: Soft, nontender, no hepatosplenomegaly, no distention.  Neurologic: Alert and oriented x 3.  Psych: Normal affect. Extremities: No clubbing or cyanosis.   TELEMETRY: Reviewed telemetry pt in sinus tachy 100  ASSESSMENT AND PLAN: 58 yo with stage IIIA nonsmall cell lung cancer s/p chemotherapy and XRT was admitted with large pericardial effusion and early tamponade.  She is now s/p pericardial window.  Chest tube remains in place.  She feels much better.  Good blood pressure.  Suspect malignant effusion.  Will order echocardiogram for tomorrow to reassess pericardium.   Loralie Champagne 01/05/2014

## 2014-01-06 ENCOUNTER — Inpatient Hospital Stay (HOSPITAL_COMMUNITY): Payer: BC Managed Care – PPO

## 2014-01-06 DIAGNOSIS — I517 Cardiomegaly: Secondary | ICD-10-CM

## 2014-01-06 DIAGNOSIS — C3490 Malignant neoplasm of unspecified part of unspecified bronchus or lung: Secondary | ICD-10-CM

## 2014-01-06 LAB — CBC
HEMATOCRIT: 41.3 % (ref 36.0–46.0)
Hemoglobin: 13.6 g/dL (ref 12.0–15.0)
MCH: 33.1 pg (ref 26.0–34.0)
MCHC: 32.9 g/dL (ref 30.0–36.0)
MCV: 100.5 fL — AB (ref 78.0–100.0)
Platelets: 230 10*3/uL (ref 150–400)
RBC: 4.11 MIL/uL (ref 3.87–5.11)
RDW: 12.2 % (ref 11.5–15.5)
WBC: 9 10*3/uL (ref 4.0–10.5)

## 2014-01-06 LAB — COMPREHENSIVE METABOLIC PANEL
ALBUMIN: 2.7 g/dL — AB (ref 3.5–5.2)
ALT: 15 U/L (ref 0–35)
AST: 20 U/L (ref 0–37)
Alkaline Phosphatase: 83 U/L (ref 39–117)
Anion gap: 11 (ref 5–15)
BILIRUBIN TOTAL: 0.3 mg/dL (ref 0.3–1.2)
BUN: 5 mg/dL — AB (ref 6–23)
CHLORIDE: 97 meq/L (ref 96–112)
CO2: 30 meq/L (ref 19–32)
CREATININE: 0.51 mg/dL (ref 0.50–1.10)
Calcium: 8.6 mg/dL (ref 8.4–10.5)
GLUCOSE: 105 mg/dL — AB (ref 70–99)
Potassium: 4.3 mEq/L (ref 3.7–5.3)
Sodium: 138 mEq/L (ref 137–147)
Total Protein: 6.3 g/dL (ref 6.0–8.3)

## 2014-01-06 NOTE — Progress Notes (Signed)
Echocardiogram 2D Echocardiogram has been performed.  Joelene Millin 01/06/2014, 10:11 AM

## 2014-01-06 NOTE — Progress Notes (Addendum)
T[facility] DAILY ICU PROGRESS NOTE                   [address]            [place] [address]          [phone]   2 Days Post-Op Procedure(s) (LRB): SUBXYPHOID PERICARDIAL WINDOW (N/A) INTRAOPERATIVE TRANSESOPHAGEAL ECHOCARDIOGRAM (N/A)  Total Length of Stay:  LOS: 3 days   Subjective: Feels ok , minor pain controlled with PCA  Objective: Vital signs in last 24 hours: Temp:  [97.4 F (36.3 C)-99.9 F (37.7 C)] 97.5 F (36.4 C) (08/02 0349) Pulse Rate:  [105-120] 109 (08/02 0700) Cardiac Rhythm:  [-] Sinus tachycardia (08/02 0408) Resp:  [13-23] 16 (08/02 0746) BP: (83-123)/(48-78) 95/52 mmHg (08/02 0700) SpO2:  [94 %-99 %] 94 % (08/02 0746) Arterial Line BP: (128)/(69) 128/69 mmHg (08/01 1000)  Filed Weights   01/03/14 1254 01/04/14 0600 01/05/14 0400  Weight: 133 lb 13.1 oz (60.7 kg) 132 lb 7.9 oz (60.1 kg) 134 lb 0.6 oz (60.8 kg)    Weight change:    Hemodynamic parameters for last 24 hours:    Intake/Output from previous day: 08/01 0701 - 08/02 0700 In: 976.5 [P.O.:460; I.V.:466.5; IV Piggyback:50] Out: 1430 [Urine:1300; Chest Tube:130]  Intake/Output this shift:    Current Meds: Scheduled Meds: . acetaminophen  1,000 mg Oral 4 times per day   Or  . acetaminophen (TYLENOL) oral liquid 160 mg/5 mL  1,000 mg Oral 4 times per day  . amphetamine-dextroamphetamine  20 mg Oral QAC breakfast  . antiseptic oral rinse  15 mL Mouth Rinse BID  . bisacodyl  10 mg Oral Daily  . budesonide-formoterol  2 puff Inhalation BID  . fentaNYL   Intravenous 6 times per day  . senna-docusate  1 tablet Oral QHS  . traZODone  50 mg Oral QHS   Continuous Infusions: . dextrose 5 % and 0.9% NaCl 10 mL/hr at 01/05/14 1220   PRN Meds:.albuterol, diphenhydrAMINE, diphenhydrAMINE, loratadine, naloxone, ondansetron (ZOFRAN) IV, potassium chloride, sodium chloride, traMADol  General appearance: alert, cooperative and no distress Heart: regular rate and  rhythm Lungs: clear to auscultation bilaterally Abdomen: benign Extremities: no edema Wound: incis healing well  Lab Results: CBC: Recent Labs  01/05/14 0410 01/06/14 0311  WBC 9.9 9.0  HGB 13.3 13.6  HCT 39.6 41.3  PLT 238 230   BMET:  Recent Labs  01/05/14 0410 01/06/14 0311  NA 137 138  K 4.2 4.3  CL 101 97  CO2 26 30  GLUCOSE 110* 105*  BUN 4* 5*  CREATININE 0.48* 0.51  CALCIUM 8.0* 8.6    PT/INR:  Recent Labs  01/03/14 1338  LABPROT 13.3  INR 1.01   Radiology: Dg Chest Port 1 View  01/06/2014   CLINICAL DATA:  Chest tube  EXAM: PORTABLE CHEST - 1 VIEW  COMPARISON:  01/05/2014  FINDINGS: Stable right lung mass.  Stable pericardial drain.  Chronic interstitial markings/emphysematous changes with bullous changes at the left lung apex. No pleural effusion or pneumothorax.  IMPRESSION: Stable pericardial drain.  Stable right lung mass.   Electronically Signed   By: Julian Hy M.D.   On: 01/06/2014 08:49   Dg Chest Port 1 View  01/05/2014   CLINICAL DATA:  Pericardial drains  EXAM: PORTABLE CHEST - 1 VIEW  COMPARISON:  Prior chest x-ray 01/04/2014  FINDINGS: Stable position of pericardial drain. No mediastinal air or pneumothorax. Background changes of advanced emphysema. Similar  right upper lobe pulmonary mass with adjacent bulla and architectural distortion. No new airspace opacity. No acute osseous abnormality.  IMPRESSION: 1. Stable position of the pericardial drain. 2. No acute abnormality.  Stable appearance of the lungs.   Electronically Signed   By: Jacqulynn Cadet M.D.   On: 01/05/2014 08:01   Dg Chest Port 1 View  01/04/2014   CLINICAL DATA:  Status post pericardial window procedure.  EXAM: PORTABLE CHEST - 1 VIEW  COMPARISON:  01/03/2014 Chest x-ray and 12/05/2013 Chest CT.  FINDINGS: Stable radiation changes involving the right lung with loss of volume. The left lung is clear. The cardiopericardial silhouette is normal. A pericardial drainage catheter is  in place.  IMPRESSION: Status post pericardial drainage procedure with a drainage catheter in place. The cardiopericardial silhouette is now normal.  Stable radiation changes involving the right lung.   Electronically Signed   By: Kalman Jewels M.D.   On: 01/04/2014 15:05     Assessment/Plan: S/P Procedure(s) (LRB): SUBXYPHOID PERICARDIAL WINDOW (N/A) INTRAOPERATIVE TRANSESOPHAGEAL ECHOCARDIOGRAM (N/A)  1 drainage decreasing - 130cc yesterday, cardiology has ordered echo, poss remove tube soon     GOLD,WAYNE E 01/06/2014 9:13 AM  2 D echo in progress now. No residual effusion apparent Drainage tapering off. Should be able to remove pericardial drain tomorrow

## 2014-01-06 NOTE — Progress Notes (Signed)
Patient ID: Linda Donovan, female   DOB: 1956/03/30, 58 y.o.   MRN: [id number]   SUBJECTIVE: Status post pericardial window Friday, feels much better.  Much less dyspnea.  Chest tube remains in place, 130 cc drainage yesterday (decreasing).   Scheduled Meds: . acetaminophen  1,000 mg Oral 4 times per day   Or  . acetaminophen (TYLENOL) oral liquid 160 mg/5 mL  1,000 mg Oral 4 times per day  . amphetamine-dextroamphetamine  20 mg Oral QAC breakfast  . antiseptic oral rinse  15 mL Mouth Rinse BID  . bisacodyl  10 mg Oral Daily  . budesonide-formoterol  2 puff Inhalation BID  . fentaNYL   Intravenous 6 times per day  . senna-docusate  1 tablet Oral QHS  . traZODone  50 mg Oral QHS   Continuous Infusions: . dextrose 5 % and 0.9% NaCl 10 mL/hr at 01/05/14 1220   PRN Meds:.albuterol, diphenhydrAMINE, diphenhydrAMINE, loratadine, naloxone, ondansetron (ZOFRAN) IV, potassium chloride, sodium chloride, traMADol    Filed Vitals:   01/06/14 0500 01/06/14 0600 01/06/14 0700 01/06/14 0740  BP: 87/59 83/55 95/52    Pulse: 116 114 109   Temp:      TempSrc:      Resp: 17 15 13    Height:      Weight:      SpO2: 99% 97% 96% 94%    Intake/Output Summary (Last 24 hours) at 01/06/14 0746 Last data filed at 01/06/14 0600  Gross per 24 hour  Intake 966.52 ml  Output   1430 ml  Net -463.48 ml    LABS: Basic Metabolic Panel:  Recent Labs  01/03/14 1338 01/04/14 0319 01/05/14 0410 01/06/14 0311  NA 141 141 137 138  K 3.9 4.2 4.2 4.3  CL 101 102 101 97  CO2 27 28 26 30   GLUCOSE 89 94 110* 105*  BUN 7 5* 4* 5*  CREATININE 0.58 0.58 0.48* 0.51  CALCIUM 9.2 8.8 8.0* 8.6  MG 1.6 1.5  --   --   PHOS 2.9  --   --   --    Liver Function Tests:  Recent Labs  01/03/14 1338 01/06/14 0311  AST 25 20  ALT 18 15  ALKPHOS 105 83  BILITOT 0.3 0.3  PROT 7.4 6.3  ALBUMIN 3.6 2.7*   No results found for this basename: LIPASE, AMYLASE,  in the last 72 hours CBC:  Recent Labs  01/03/14 1338  01/05/14 0410 01/06/14 0311  WBC 9.2  < > 9.9 9.0  NEUTROABS 7.3  --   --   --   HGB 13.8  < > 13.3 13.6  HCT 41.2  < > 39.6 41.3  MCV 99.3  < > 99.2 100.5*  PLT 294  < > 238 230  < > = values in this interval not displayed. Cardiac Enzymes: No results found for this basename: CKTOTAL, CKMB, CKMBINDEX, TROPONINI,  in the last 72 hours BNP: No components found with this basename: POCBNP,  D-Dimer: No results found for this basename: DDIMER,  in the last 72 hours Hemoglobin A1C: No results found for this basename: HGBA1C,  in the last 72 hours Fasting Lipid Panel: No results found for this basename: CHOL, HDL, LDLCALC, TRIG, CHOLHDL, LDLDIRECT,  in the last 72 hours Thyroid Function Tests:  Recent Labs  01/03/14 1300  TSH 0.913   Anemia Panel: No results found for this basename: VITAMINB12, FOLATE, FERRITIN, TIBC, IRON, RETICCTPCT,  in the last 72 hours  RADIOLOGY: Dg Chest  Port 1 View  01/04/2014   CLINICAL DATA:  Status post pericardial window procedure.  EXAM: PORTABLE CHEST - 1 VIEW  COMPARISON:  01/03/2014 Chest x-ray and 12/05/2013 Chest CT.  FINDINGS: Stable radiation changes involving the right lung with loss of volume. The left lung is clear. The cardiopericardial silhouette is normal. A pericardial drainage catheter is in place.  IMPRESSION: Status post pericardial drainage procedure with a drainage catheter in place. The cardiopericardial silhouette is now normal.  Stable radiation changes involving the right lung.   Electronically Signed   By: Kalman Jewels M.D.   On: 01/04/2014 15:05   Dg Chest Port 1 View  01/03/2014   CLINICAL DATA:  History of lung carcinoma; COPD  EXAM: PORTABLE CHEST - 1 VIEW  COMPARISON:  Chest radiograph March 08, 2013; chest CT December 05, 2013  FINDINGS: The area of mass/consolidation in the right upper lobe appears less pronounced compared to recent CT examination. There is still patchy opacity in this area.  There is underlying  emphysema. Mild interstitial prominence in the bases may in part reflect redistribution of blood flow to a viable segments of lung. There is no frank edema or consolidation apart from the opacity in the right upper lobe. The cardiac silhouette is enlarged with pulmonary vascularity reflecting underlying emphysema. Some of the apparent cardiac enlargement may be due to pericardial effusion based on appearance on recent CT. No adenopathy is appreciable radiographically. There are no bone lesions.  IMPRESSION: Less opacity is noted in the right upper lobe compared to recent CT. There is still some patchy opacity in this area, and there may well be mass with postobstructive pneumonitis in this area. Disease has not progressed in this area compared to most recent prior study. There is no new opacity. There is underlying emphysema with cardiomegaly ; the cardiomegaly in part may be due to pericardial effusion. Recent CT shows sizable pericardial effusion. The previously noted adenopathy, particularly in the sub- carinal region, is not appreciable on radiographic examination. CT is more sensitive for adenopathy than is radiography.   Electronically Signed   By: Lowella Grip M.D.   On: 01/03/2014 14:09    PHYSICAL EXAM General: NAD Neck: No JVD, no thyromegaly or thyroid nodule.  Lungs: Decreased breath sounds bilaterally CV: Nondisplaced PMI.  Somewhat distant heart sounds, heart regular S1/S2, no S3/S4, no murmur.  No peripheral edema.   Abdomen: Soft, nontender, no hepatosplenomegaly, no distention.  Neurologic: Alert and oriented x 3.  Psych: Normal affect. Extremities: No clubbing or cyanosis.   TELEMETRY: Reviewed telemetry pt in sinus tachy 100  ASSESSMENT AND PLAN: 58 yo with stage IIIA nonsmall cell lung cancer s/p chemotherapy and XRT was admitted with large pericardial effusion and early tamponade.  She is now s/p pericardial window.  Chest tube remains in place but drainage decreasing (130  cc yesterday).  She feels much better overall.  Suspect malignant effusion, awaiting cytology.  Echo today to reassess pericardium.  Can go to step-down.   Loralie Champagne 01/06/2014

## 2014-01-07 ENCOUNTER — Encounter (HOSPITAL_COMMUNITY): Payer: Self-pay | Admitting: Thoracic Surgery (Cardiothoracic Vascular Surgery)

## 2014-01-07 DIAGNOSIS — R0602 Shortness of breath: Secondary | ICD-10-CM

## 2014-01-07 DIAGNOSIS — R000 Tachycardia, unspecified: Secondary | ICD-10-CM

## 2014-01-07 LAB — CBC
HEMATOCRIT: 38.8 % (ref 36.0–46.0)
Hemoglobin: 12.8 g/dL (ref 12.0–15.0)
MCH: 32.8 pg (ref 26.0–34.0)
MCHC: 33 g/dL (ref 30.0–36.0)
MCV: 99.5 fL (ref 78.0–100.0)
PLATELETS: 234 10*3/uL (ref 150–400)
RBC: 3.9 MIL/uL (ref 3.87–5.11)
RDW: 12.4 % (ref 11.5–15.5)
WBC: 9.1 10*3/uL (ref 4.0–10.5)

## 2014-01-07 LAB — BASIC METABOLIC PANEL
ANION GAP: 10 (ref 5–15)
BUN: 5 mg/dL — ABNORMAL LOW (ref 6–23)
CO2: 30 meq/L (ref 19–32)
CREATININE: 0.54 mg/dL (ref 0.50–1.10)
Calcium: 8.9 mg/dL (ref 8.4–10.5)
Chloride: 100 mEq/L (ref 96–112)
GFR calc Af Amer: >90 mL/min (ref >90)
Glucose, Bld: 99 mg/dL (ref 70–99)
Potassium: 4.4 mEq/L (ref 3.7–5.3)
SODIUM: 140 meq/L (ref 137–147)

## 2014-01-07 MED ORDER — OXYCODONE-ACETAMINOPHEN 5-325 MG PO TABS
1.0000 | ORAL_TABLET | ORAL | Status: DC | PRN
  Administered 2014-01-07 – 2014-01-09 (×7): 1 via ORAL
  Filled 2014-01-07 (×7): qty 1

## 2014-01-07 MED ORDER — MAGNESIUM OXIDE 400 (241.3 MG) MG PO TABS
400.0000 mg | ORAL_TABLET | Freq: Every day | ORAL | Status: DC
  Administered 2014-01-07 – 2014-01-09 (×3): 400 mg via ORAL
  Filled 2014-01-07 (×3): qty 1

## 2014-01-07 NOTE — Progress Notes (Signed)
Subjective:  Feeling much better, less SOB since sub xyphoid pericardiectomy  Objective:  Temp:  [97.6 F (36.4 C)-99 F (37.2 C)] 98 F (36.7 C) (08/03 0800) Pulse Rate:  [117-125] 118 (08/03 0319) Resp:  [16-28] 19 (08/03 0804) BP: (106-134)/(61-76) 113/61 mmHg (08/03 0800) SpO2:  [94 %-98 %] 96 % (08/03 0804) Weight change:   Intake/Output from previous day: 08/02 0701 - 08/03 0700 In: 382.5 [P.O.:240; I.V.:142.5] Out: 1440 [Urine:1350; Chest Tube:90]  Intake/Output from this shift: Total I/O In: -  Out: 400 [Urine:400]  Physical Exam: General appearance: alert and no distress Neck: no adenopathy, no carotid bruit, no JVD, supple, symmetrical, trachea midline and thyroid not enlarged, symmetric, no tenderness/mass/nodules Lungs: clear to auscultation bilaterally Heart: regular rate and rhythm, S1, S2 normal, no murmur, click, rub or gallop and mildly tachy Extremities: extremities normal, atraumatic, no cyanosis or edema  Lab Results: Results for orders placed during the hospital encounter of 01/03/14 (from the past 48 hour(s))  CBC     Status: Abnormal   Collection Time    01/06/14  3:11 AM      Result Value Ref Range   WBC 9.0  4.0 - 10.5 K/uL   RBC 4.11  3.87 - 5.11 MIL/uL   Hemoglobin 13.6  12.0 - 15.0 g/dL   HCT 41.3  36.0 - 46.0 %   MCV 100.5 (*) 78.0 - 100.0 fL   MCH 33.1  26.0 - 34.0 pg   MCHC 32.9  30.0 - 36.0 g/dL   RDW 12.2  11.5 - 15.5 %   Platelets 230  150 - 400 K/uL  COMPREHENSIVE METABOLIC PANEL     Status: Abnormal   Collection Time    01/06/14  3:11 AM      Result Value Ref Range   Sodium 138  137 - 147 mEq/L   Potassium 4.3  3.7 - 5.3 mEq/L   Chloride 97  96 - 112 mEq/L   CO2 30  19 - 32 mEq/L   Glucose, Bld 105 (*) 70 - 99 mg/dL   BUN 5 (*) 6 - 23 mg/dL   Creatinine, Ser 0.51  0.50 - 1.10 mg/dL   Calcium 8.6  8.4 - 10.5 mg/dL   Total Protein 6.3  6.0 - 8.3 g/dL   Albumin 2.7 (*) 3.5 - 5.2 g/dL   AST 20  0 - 37 U/L   ALT 15   0 - 35 U/L   Alkaline Phosphatase 83  39 - 117 U/L   Total Bilirubin 0.3  0.3 - 1.2 mg/dL   GFR calc non Af Amer >90  >90 mL/min   GFR calc Af Amer >90  >90 mL/min   Comment: (NOTE)     The eGFR has been calculated using the CKD EPI equation.     This calculation has not been validated in all clinical situations.     eGFR's persistently <90 mL/min signify possible Chronic Kidney     Disease.   Anion gap 11  5 - 15  BASIC METABOLIC PANEL     Status: Abnormal   Collection Time    01/07/14  2:33 AM      Result Value Ref Range   Sodium 140  137 - 147 mEq/L   Potassium 4.4  3.7 - 5.3 mEq/L   Chloride 100  96 - 112 mEq/L   CO2 30  19 - 32 mEq/L   Glucose, Bld 99  70 - 99 mg/dL   BUN  5 (*) 6 - 23 mg/dL   Creatinine, Ser 0.54  0.50 - 1.10 mg/dL   Calcium 8.9  8.4 - 10.5 mg/dL   GFR calc non Af Amer >90  >90 mL/min   GFR calc Af Amer >90  >90 mL/min   Comment: (NOTE)     The eGFR has been calculated using the CKD EPI equation.     This calculation has not been validated in all clinical situations.     eGFR's persistently <90 mL/min signify possible Chronic Kidney     Disease.   Anion gap 10  5 - 15  CBC     Status: None   Collection Time    01/07/14  2:33 AM      Result Value Ref Range   WBC 9.1  4.0 - 10.5 K/uL   RBC 3.90  3.87 - 5.11 MIL/uL   Hemoglobin 12.8  12.0 - 15.0 g/dL   HCT 38.8  36.0 - 46.0 %   MCV 99.5  78.0 - 100.0 fL   MCH 32.8  26.0 - 34.0 pg   MCHC 33.0  30.0 - 36.0 g/dL   RDW 12.4  11.5 - 15.5 %   Platelets 234  150 - 400 K/uL    Imaging: Imaging results have been reviewed  Assessment/Plan:   1. Active Problems: 2.   Pericardial effusion with cardiac tamponade 3.   Pericardial effusion without cardiac tamponade 4.   Time Spent Directly with Patient:  20 minutes  Length of Stay:  LOS: 4 days   S/P sub xyphoid pericardiectomy for large pericardial effusion with impending tamponade. Feeling much better. Pericardial drain still in place. Drainage  decreased. Hopefully TCTS will remove. Repeat 2D shows nl LV fxn but couldn't appreciate pericardium. Cardiac silhouette on CXR better. Keep in step down today, transfer out to tele tomorrow if drainage catheter D/Cd. Cytology pending. Suspect malignant pericardial effusion.   Linda Donovan 01/07/2014, 11:11 AM

## 2014-01-07 NOTE — Progress Notes (Signed)
Fentanyl 3cc wasted in sink witness with second RN

## 2014-01-07 NOTE — Progress Notes (Addendum)
      [address]       [place] [address]             [phone]        3 Days Post-Op Procedure(s) (LRB): SUBXYPHOID PERICARDIAL WINDOW (N/A) INTRAOPERATIVE TRANSESOPHAGEAL ECHOCARDIOGRAM (N/A)  Subjective: Patient sitting in chair. Hopes drain is removed soon as is uncomfortable.  Objective: Vital signs in last 24 hours: Temp:  [97.6 F (36.4 C)-99 F (37.2 C)] 98 F (36.7 C) (08/03 0800) Pulse Rate:  [108-125] 118 (08/03 0319) Cardiac Rhythm:  [-] Sinus tachycardia (08/03 0319) Resp:  [14-28] 19 (08/03 0804) BP: (102-134)/(53-76) 113/61 mmHg (08/03 0800) SpO2:  [94 %-98 %] 96 % (08/03 0804)   Current Weight  01/05/14 134 lb 0.6 oz (60.8 kg)       Intake/Output from previous day: 08/02 0701 - 08/03 0700 In: 382.5 [P.O.:240; I.V.:142.5] Out: 1440 [Urine:1350; Chest Tube:90]   Physical Exam:  Cardiovascular: Tachycardic Pulmonary: Clear to auscultation bilaterally; no rales, wheezes, or rhonchi. Extremities: No lower extremity edema. Wound: Clean and dry.  No erythema or signs of infection.  Lab Results: CBC: Recent Labs  01/06/14 0311 01/07/14 0233  WBC 9.0 9.1  HGB 13.6 12.8  HCT 41.3 38.8  PLT 230 234   BMET:  Recent Labs  01/06/14 0311 01/07/14 0233  NA 138 140  K 4.3 4.4  CL 97 100  CO2 30 30  GLUCOSE 105* 99  BUN 5* 5*  CREATININE 0.51 0.54  CALCIUM 8.6 8.9    PT/INR:  Lab Results  Component Value Date   INR 1.01 01/03/2014   INR 0.89 09/04/2012   ABG:  INR: Will add last result for INR, ABG once components are confirmed Will add last 4 CBG results once components are confirmed  Assessment/Plan:  1. CV - ST.Pericardial drain with 90 cc last 24 hours.2 D echo done yesterday. Results showed LVEF 60%, but unable to assess for pericardial effusion. Cultures from pericardial fluid showed no growth. Likely remove pericardial drain. Stop PCA after removal.    ZIMMERMAN,DONIELLE MPA-C 01/07/2014,9:26  AM  Patient seen and examined, agree with above No WBC in fluid- cytology still pending Dc chest tube

## 2014-01-07 NOTE — Progress Notes (Addendum)
Pericardial chest tube removed without difficulty. Patient tolerated procedure well. Will obtain CXR in am. I spoke with patient and she has taken Percocet before. Will give Q 4 hours PRN severe pain and stop PCA.

## 2014-01-08 ENCOUNTER — Ambulatory Visit: Payer: BC Managed Care – PPO | Admitting: Cardiovascular Disease

## 2014-01-08 ENCOUNTER — Inpatient Hospital Stay (HOSPITAL_COMMUNITY): Payer: BC Managed Care – PPO

## 2014-01-08 LAB — BODY FLUID CULTURE
CULTURE: NO GROWTH
GRAM STAIN: NONE SEEN

## 2014-01-08 MED ORDER — METOPROLOL SUCCINATE ER 25 MG PO TB24
25.0000 mg | ORAL_TABLET | Freq: Every day | ORAL | Status: DC
  Administered 2014-01-08 – 2014-01-09 (×2): 25 mg via ORAL
  Filled 2014-01-08 (×2): qty 1

## 2014-01-08 NOTE — Progress Notes (Signed)
CARDIAC REHAB PHASE I   PRE:  Rate/Rhythm: 113 ST irregular    BP: sitting 127/73    SaO2: 94 2L  MODE:  Ambulation: 350 ft   POST:  Rate/Rhythm: 112 ST irregular    BP: sitting 129/85     SaO2: 88-90 2L  Quick pace, steady. Denied SOB until end of walk. C/o weak legs toward end (first walk since surgery). HR continues 110s ST irregular at times. To chair. Encouraged more walking with family. [place], [place], ACSM 01/08/2014 3:06 PM

## 2014-01-08 NOTE — Progress Notes (Signed)
     Subjective:  S/P sub xyphoid pericardial window for pre tamponade. Drain removed yesterday. Denies CP/SOB  Objective:  Temp:  [97.8 F (36.6 C)-99 F (37.2 C)] 98 F (36.7 C) (08/04 1205) Pulse Rate:  [103-122] 103 (08/04 0400) BP: (94-126)/(62-88) 110/78 mmHg (08/04 1205) SpO2:  [91 %-98 %] 97 % (08/04 1205) Weight change:   Intake/Output from previous day: 08/03 0701 - 08/04 0700 In: 360 [P.O.:360] Out: 400 [Urine:400]  Intake/Output from this shift:    Physical Exam: General appearance: alert and no distress Neck: no adenopathy, no carotid bruit, no JVD, supple, symmetrical, trachea midline and thyroid not enlarged, symmetric, no tenderness/mass/nodules Lungs: clear to auscultation bilaterally Heart: regular rate and rhythm, S1, S2 normal, no murmur, click, rub or gallop Extremities: extremities normal, atraumatic, no cyanosis or edema  Lab Results: Results for orders placed during the hospital encounter of 01/03/14 (from the past 48 hour(s))  BASIC METABOLIC PANEL     Status: Abnormal   Collection Time    01/07/14  2:33 AM      Result Value Ref Range   Sodium 140  137 - 147 mEq/L   Potassium 4.4  3.7 - 5.3 mEq/L   Chloride 100  96 - 112 mEq/L   CO2 30  19 - 32 mEq/L   Glucose, Bld 99  70 - 99 mg/dL   BUN 5 (*) 6 - 23 mg/dL   Creatinine, Ser 0.54  0.50 - 1.10 mg/dL   Calcium 8.9  8.4 - 10.5 mg/dL   GFR calc non Af Amer >90  >90 mL/min   GFR calc Af Amer >90  >90 mL/min   Comment: (NOTE)     The eGFR has been calculated using the CKD EPI equation.     This calculation has not been validated in all clinical situations.     eGFR's persistently <90 mL/min signify possible Chronic Kidney     Disease.   Anion gap 10  5 - 15  CBC     Status: None   Collection Time    01/07/14  2:33 AM      Result Value Ref Range   WBC 9.1  4.0 - 10.5 K/uL   RBC 3.90  3.87 - 5.11 MIL/uL   Hemoglobin 12.8  12.0 - 15.0 g/dL   HCT 38.8  36.0 - 46.0 %   MCV 99.5  78.0 - 100.0  fL   MCH 32.8  26.0 - 34.0 pg   MCHC 33.0  30.0 - 36.0 g/dL   RDW 12.4  11.5 - 15.5 %   Platelets 234  150 - 400 K/uL    Imaging: Imaging results have been reviewed  Assessment/Plan:   1. Active Problems: 2.   Pericardial effusion with cardiac tamponade 3.   Pericardial effusion without cardiac tamponade 4.   Time Spent Directly with Patient:  20 minutes  Length of Stay:  LOS: 5 days   Feels much better. Cytology negative for malignancy. Drain removed. Mildly tachy. Will start low dose BB. Tx tele. [facility]. Prob home AM.  Linda Donovan 01/08/2014, 1:28 PM

## 2014-01-08 NOTE — Progress Notes (Signed)
Informed by nursing staff patient continue to have HR 120-130s after pericardial window. Upon entering room, patient was sitting up playing dice game with family. Denies any discomfort or SOB. No increasing symptom when lay down. BP stable. Does have some rhonchi with inspiration, no pericardial rub. Will continue to monitor.  Hilbert Corrigan PA Pager: [phone]

## 2014-01-08 NOTE — Progress Notes (Addendum)
      [address]       [place] [address]             [phone]        4 Days Post-Op Procedure(s) (LRB): SUBXYPHOID PERICARDIAL WINDOW (N/A) INTRAOPERATIVE TRANSESOPHAGEAL ECHOCARDIOGRAM (N/A)  Subjective: Patient slept very well, as drain removed yesterday afternoon.  Objective: Vital signs in last 24 hours: Temp:  [97.8 F (36.6 C)-99 F (37.2 C)] 97.8 F (36.6 C) (08/04 0410) Pulse Rate:  [103-122] 103 (08/04 0400) Cardiac Rhythm:  [-] Sinus tachycardia (08/04 0400) Resp:  [19-22] 21 (08/03 1221) BP: (94-126)/(60-88) 109/67 mmHg (08/04 0400) SpO2:  [92 %-98 %] 92 % (08/04 0400)   Current Weight  01/05/14 134 lb 0.6 oz (60.8 kg)       Intake/Output from previous day: 08/03 0701 - 08/04 0700 In: 360 [P.O.:360] Out: 400 [Urine:400]   Physical Exam:  Cardiovascular: Tachycardic Pulmonary: Clear to auscultation bilaterally; no rales, wheezes, or rhonchi. Extremities: No lower extremity edema. Wound: Clean and dry.  No erythema or signs of infection.  Lab Results: CBC:  Recent Labs  01/06/14 0311 01/07/14 0233  WBC 9.0 9.1  HGB 13.6 12.8  HCT 41.3 38.8  PLT 230 234   BMET:   Recent Labs  01/06/14 0311 01/07/14 0233  NA 138 140  K 4.3 4.4  CL 97 100  CO2 30 30  GLUCOSE 105* 99  BUN 5* 5*  CREATININE 0.51 0.54  CALCIUM 8.6 8.9    PT/INR:  Lab Results  Component Value Date   INR 1.01 01/03/2014   INR 0.89 09/04/2012   ABG:  INR: Will add last result for INR, ABG once components are confirmed Will add last 4 CBG results once components are confirmed  Assessment/Plan:  1. CV - ST in low 100's.Pericardial drain removed yesterday. CXR this am shows chronic changes in right apex, no pneumothorax. Cytology results of fluid showed Pericardium, biopsy: - MILDLY INFLAMED MESOTHELIAL LINED FIBROADIPOSE TISSUE. - THERE IS NO EVIDENCE OF MALIGNANCY. 2. Will arrange follow up appointment in a couple of weeks 3. Management  per Dr. Gwendel Hanson MPA-C 01/08/2014,7:59 AM   Patient seen and examined, agree with above Cytology- reactive cells and path showed inflammation- no evidence of malignancy which is consistent with what we saw intraoperatively Plan per Cardiology- i will see her back in the office in follow up

## 2014-01-09 ENCOUNTER — Telehealth: Payer: Self-pay | Admitting: Emergency Medicine

## 2014-01-09 DIAGNOSIS — J441 Chronic obstructive pulmonary disease with (acute) exacerbation: Secondary | ICD-10-CM

## 2014-01-09 MED ORDER — OXYCODONE-ACETAMINOPHEN 5-325 MG PO TABS: 1.0000 | ORAL_TABLET | ORAL | Status: DC | PRN

## 2014-01-09 MED ORDER — METOPROLOL SUCCINATE ER 25 MG PO TB24: 25.0000 mg | ORAL_TABLET | Freq: Every day | ORAL | Status: DC

## 2014-01-09 NOTE — Progress Notes (Signed)
Subjective: Feeling better.  No SOB or dizziness.  Objective: Vital signs in last 24 hours: Temp:  [97.8 F (36.6 C)-98.2 F (36.8 C)] 97.8 F (36.6 C) (08/05 0510) Pulse Rate:  [76-120] 91 (08/05 0510) Resp:  [18] 18 (08/05 0510) BP: (95-129)/(68-85) 95/69 mmHg (08/05 0510) SpO2:  [91 %-97 %] 96 % (08/05 0510) Weight:  [128 lb 9.6 oz (58.333 kg)] 128 lb 9.6 oz (58.333 kg) (08/05 0510) Last BM Date: 01/07/14  Intake/Output from previous day: 08/04 0701 - 08/05 0700 In: 120 [P.O.:120] Out: -  Intake/Output this shift:    Medications Current Facility-Administered Medications  Medication Dose Route Frequency Provider Last Rate Last Dose  . acetaminophen (TYLENOL) tablet 1,000 mg  1,000 mg Oral 4 times per day John Giovanni, PA-C   1,000 mg at 01/07/14 1514   Or  . acetaminophen (TYLENOL) solution 1,000 mg  1,000 mg Oral 4 times per day John Giovanni, PA-C      . albuterol (PROVENTIL) (2.5 MG/3ML) 0.083% nebulizer solution 2.5 mg  2.5 mg Nebulization Q6H PRN Wilhelmina Mcardle, MD   2.5 mg at 01/08/14 2046  . amphetamine-dextroamphetamine (ADDERALL) tablet 20 mg  20 mg Oral QAC breakfast [name], MD   20 mg at 01/08/14 4665  . antiseptic oral rinse (BIOTENE) solution 15 mL  15 mL Mouth Rinse BID Sueanne Margarita, MD   15 mL at 01/08/14 2000  . bisacodyl (DULCOLAX) EC tablet 10 mg  10 mg Oral Daily John Giovanni, PA-C   10 mg at 01/07/14 9935  . budesonide-formoterol (SYMBICORT) 160-4.5 MCG/ACT inhaler 2 puff  2 puff Inhalation BID Wilhelmina Mcardle, MD   2 puff at 01/08/14 2042  . dextrose 5 %-0.9 % sodium chloride infusion   Intravenous Continuous John Giovanni, PA-C 10 mL/hr at 01/06/14 1945 10 mL at 01/06/14 1945  . loratadine (CLARITIN) tablet 10 mg  10 mg Oral Daily PRN Wayne E Gold, PA-C      . magnesium oxide (MAG-OX) tablet 400 mg  400 mg Oral Daily Lorretta Harp, MD   400 mg at 01/08/14 0955  . metoprolol succinate (TOPROL-XL) 24 hr tablet 25 mg  25 mg Oral Daily  Lorretta Harp, MD   25 mg at 01/08/14 1437  . oxyCODONE-acetaminophen (PERCOCET/ROXICET) 5-325 MG per tablet 1 tablet  1 tablet Oral Q4H PRN Nani Skillern, PA-C   1 tablet at 01/09/14 0000  . potassium chloride 10 mEq in 50 mL *CENTRAL LINE* IVPB  10 mEq Intravenous Daily PRN John Giovanni, PA-C      . senna-docusate (Senokot-S) tablet 1 tablet  1 tablet Oral QHS John Giovanni, PA-C   1 tablet at 01/06/14 2102  . traMADol (ULTRAM) tablet 50-100 mg  50-100 mg Oral Q6H PRN John Giovanni, PA-C   100 mg at 01/06/14 7017  . traZODone (DESYREL) tablet 50 mg  50 mg Oral QHS [name], MD   50 mg at 01/08/14 2303    PE: General appearance: alert, cooperative and no distress Lungs: clear to auscultation bilaterally Heart: regular rate and rhythm, S1, S2 normal, no murmur, click, rub or gallop Extremities: No LEE Pulses: 2+ and symmetric Skin: Warm and dry Neurologic: Grossly normal  Lab Results:   Recent Labs  01/07/14 0233  WBC 9.1  HGB 12.8  HCT 38.8  PLT 234   BMET  Recent Labs  01/07/14 0233  NA 140  K 4.4  CL 100  CO2 30  GLUCOSE 99  BUN 5*  CREATININE 0.54  CALCIUM 8.9      Assessment/Plan   Active Problems:   Pericardial effusion with cardiac tamponade   Pericardial effusion without cardiac tamponade   Chronic O2.    stage IIIA nonsmall cell lung cancer s/p chemotherapy and XRT   Plan: S/P sub xyphoid pericardial window for pre tamponade. Cytology negative for malignancy.  Drain removed.  HR improved.  BP on the soft side.  On Toprol XL 25.  Ambulated well with cardiac rehab.  DC home today and follow up with Dr. Radford Pax.    LOS: 6 days    HAGER, BRYAN PA-C 01/09/2014 7:40 AM  Patient seen.  She is doing well.  The subxiphoid incision looks good.  She is tolerating low dose beta blocker for her sinus tachycardia.  Agree with above assessment and plan.  Okay for discharge today.

## 2014-01-09 NOTE — Progress Notes (Signed)
UR Completed Lorena Clearman Graves-Bigelow, RN,BSN [phone]  

## 2014-01-09 NOTE — Telephone Encounter (Signed)
ATC line busy x 4 wcb

## 2014-01-09 NOTE — Care Management Note (Signed)
    Page 1 of 1   01/09/2014     11:06:13 AM CARE MANAGEMENT NOTE 01/09/2014  Patient:  Linda Donovan, Linda Donovan   Account Number:  [id number]  Date Initiated:  01/03/2014  Documentation initiated by:  [facility]  Subjective/Objective Assessment:   Pericardial effusion - TCTS to consult     Action/Plan:   Anticipated DC Date:  01/07/2014   Anticipated DC Plan:  [place]  CM consult      Choice offered to / List presented to:             Status of service:  Completed, signed off Medicare Important Message given?  NO (If response is "NO", the following Medicare IM given date fields will be blank) Date Medicare IM given:   Medicare IM given by:   Date Additional Medicare IM given:   Additional Medicare IM given by:    Discharge Disposition:  HOME/SELF CARE  Per UR Regulation:  Reviewed for med. necessity/level of care/duration of stay  If discussed at [place] of Stay Meetings, dates discussed:   01/10/2014    Comments:  ContactSamaira, Linda Donovan Spouse [phone]   01-09-14 [address] RN,BSN [phone] Pericardial effusion without cardiac tamponade- plan for d/c today. No needs from CM at this time.  01-03-14 1:20pm Linda Donovan, Linda Donovan [phone] Lives at home with husband - mostly independent.  Does have home oxgyen

## 2014-01-09 NOTE — Progress Notes (Signed)
Pt discharged home with family.  Reviewed discharge instructions and education, all questions answered.  Assessment unchanged from earlier.

## 2014-01-09 NOTE — Discharge Summary (Signed)
Physician Discharge Summary    Cardiologist:  Turner  Patient ID: Linda Donovan MRN: [id number] DOB/AGE: 59-Nov-1957 59 y.o.  Admit date: 01/03/2014 Discharge date: 01/09/2014  Admission Diagnoses:  Pericardial effusion without cardiac tamponade  Discharge Diagnoses:  Active Problems:    Pericardial effusion without cardiac tamponade     Discharged Condition: stable  Hospital Course:   Linda Donovan is a 58 y.o. female with a history of COPD and non small cell lung CA who present today for evaluation of pericardial effusion. A recent chest CT showed a moderate sized pericardial effusion which was felt to possibly be due to prior XRT for her lung mass. She denies any chest pain but does have chronic DOE. She occasionally has some LE edema. She has been noticing her heart race when she gets SOB exerting herself. She has a resting tachycardia.   She had a 2D echo completed in the office and it revealed a large pericardial effusion.  She was admitted and CVTS was consulted for pericardial window.  The procedure was completed on 01/04/14 with no immediate complications.  She reports reduced dyspnea the following day.  Cytology was negative for malignancy.  Follow up echo results below.  The tube was removed on 01/07/14.  Percocet given for pain.  CXR showed persistent changes in right apex.  Lopressor was added due to persistent tachycardia in the 120-130's.  This improved.  The patient ambulated well with Cardiac Rehab.  The patient was seen by Dr. Mare Ferrari who felt she was stable for DC home.     Consults:  CVTS  Significant Diagnostic Studies:  01/06/14 Echo  Study Conclusions  - Procedure narrative: Transthoracic echocardiography. Image quality was poor. The study was technically difficult, as a result of poor sound wave transmission, surgical dressings, and excessive abdominal air. - Left ventricle: The cavity size was normal. Wall thickness was increased in a pattern of mild LVH.  Systolic function was normal. The estimated ejection fraction was in the range of 55% to 60%. Images were inadequate for LV wall motion assessment.  Impressions:  - Unable to assess previously described pericardial effusion on the basis of this study. Consider TEE if clinically indicated.    01/03/14 echo Study Conclusions  - Left ventricle: Systolic function was vigorous. The estimated ejection fraction was in the range of 65% to 70%. - Pericardium, extracardiac: A large, free-flowing pericardial effusion was identified circumferential to the heart. There was no evidence of hemodynamic compromise. Features were not consistent with tamponade physiology.     Treatments: See Above   Discharge Exam: Blood pressure 95/69, pulse 91, temperature 97.8 F (36.6 C), temperature source Oral, resp. rate 18, height 5\' 3"  (1.6 m), weight 128 lb 9.6 oz (58.333 kg), SpO2 96.00%.   Disposition: 01-Home or Self Care      Discharge Instructions   Call MD for:  redness, tenderness, or signs of infection (pain, swelling, redness, odor or green/yellow discharge around incision site)    Complete by:  As directed      Diet - low sodium heart healthy    Complete by:  As directed      Increase activity slowly    Complete by:  As directed             Medication List         albuterol (2.5 MG/3ML) 0.083% nebulizer solution  Commonly known as:  PROVENTIL  Take 2.5 mg by nebulization every 6 (six) hours as needed for wheezing  or shortness of breath.     albuterol 108 (90 BASE) MCG/ACT inhaler  Commonly known as:  PROVENTIL HFA;VENTOLIN HFA  Inhale 2 puffs into the lungs every 6 (six) hours as needed for wheezing.     amphetamine-dextroamphetamine 20 MG tablet  Commonly known as:  ADDERALL  Take 20 mg by mouth daily before breakfast.     aspirin 81 MG tablet  Take 81 mg by mouth daily.     budesonide-formoterol 160-4.5 MCG/ACT inhaler  Commonly known as:  SYMBICORT  Inhale 2 puffs  into the lungs 2 (two) times daily.     Calcium Carb-Ergocalciferol 250-125 MG-UNIT Tabs  Take 1 tablet by mouth daily.     calcium-vitamin D 250-125 MG-UNIT per tablet  Commonly known as:  OSCAL WITH D  Take 1 tablet by mouth daily.     loratadine 10 MG tablet  Commonly known as:  CLARITIN  Take 10 mg by mouth daily as needed.     magnesium oxide 400 MG tablet  Commonly known as:  MAG-OX  Take 1 tablet (400 mg total) by mouth daily.     metoprolol succinate 25 MG 24 hr tablet  Commonly known as:  TOPROL-XL  Take 1 tablet (25 mg total) by mouth daily.     multivitamin with minerals tablet  Take 1 tablet by mouth daily.     oxyCODONE-acetaminophen 5-325 MG per tablet  Commonly known as:  PERCOCET/ROXICET  Take 1 tablet by mouth every 4 (four) hours as needed for severe pain.     traZODone 50 MG tablet  Commonly known as:  DESYREL  Take 50 mg by mouth at bedtime.     zinc gluconate 50 MG tablet  Take 50 mg by mouth daily.       Follow-up Information   Follow up with [name], MD On 01/22/2014. (PA/LAT CXR to be taken (at [facility] which is in the same building as Dr. Leonarda Salon office) on 01/22/2014 at 2:30 pm; Appointment time is at 3:30 pm)    Specialty:  Cardiothoracic Surgery   Contact information:   [place] [address] [phone]       Follow up with Richardson Dopp, PA-C On 01/31/2014. (8:50 AM)    Specialty:  Physician Assistant   Contact information:   [address] [place] [address] [phone]      Greater than 30 minutes was spent completing the patient's discharge.   SignedTarri Fuller, [place] 01/09/2014, 9:40 AM

## 2014-01-10 NOTE — Telephone Encounter (Signed)
[id number]. Careen Mauch, CMA  

## 2014-01-10 NOTE — Telephone Encounter (Signed)
Pt just discharged on 8.5.15 w/ Pericardial Effusion.  She currently uses the smaller metal cylinders at 2L continuously and reports that this portable O2 system is too heavy for her and she requires someone to help her carry them.  Patient is requesting a smaller portable O2 system and also a battery operated handheld nebulizer for convenience.  Last ov 3.31.15 with TP Pt is due for follow up with RB  Dr Lamonte Sakai please advise, thank you.

## 2014-01-11 LAB — OTHER BODY FLUID CHEMISTRY

## 2014-01-11 NOTE — Telephone Encounter (Signed)
Called pt. She is requesting order go to [facility]. Please advise RB thanks

## 2014-01-11 NOTE — Telephone Encounter (Signed)
Patient is calling back about the same as below.  [phone]

## 2014-01-11 NOTE — Telephone Encounter (Signed)
If [facility] can provide then please order through them

## 2014-01-14 NOTE — Telephone Encounter (Signed)
Order has been placed to [facility]. Nothing further needed 

## 2014-01-17 ENCOUNTER — Other Ambulatory Visit: Payer: Self-pay | Admitting: Thoracic Surgery (Cardiothoracic Vascular Surgery)

## 2014-01-17 DIAGNOSIS — I629 Nontraumatic intracranial hemorrhage, unspecified: Secondary | ICD-10-CM

## 2014-01-21 ENCOUNTER — Ambulatory Visit (INDEPENDENT_AMBULATORY_CARE_PROVIDER_SITE_OTHER): Payer: Self-pay | Admitting: Surgical

## 2014-01-21 ENCOUNTER — Ambulatory Visit
Admission: RE | Admit: 2014-01-21 | Discharge: 2014-01-21 | Disposition: A | Discharge status: Home / Self Care | Payer: BC Managed Care – PPO | Source: Ambulatory Visit | Attending: Thoracic Surgery (Cardiothoracic Vascular Surgery) | Admitting: Thoracic Surgery (Cardiothoracic Vascular Surgery)

## 2014-01-21 VITALS — BP 138/93 | HR 111 | Ht 63.0 in | Wt 128.0 lb

## 2014-01-21 DIAGNOSIS — I629 Nontraumatic intracranial hemorrhage, unspecified: Secondary | ICD-10-CM

## 2014-01-21 DIAGNOSIS — I3139 Other pericardial effusion (noninflammatory): Secondary | ICD-10-CM

## 2014-01-21 DIAGNOSIS — I319 Disease of pericardium, unspecified: Secondary | ICD-10-CM

## 2014-01-21 DIAGNOSIS — I313 Pericardial effusion (noninflammatory): Secondary | ICD-10-CM

## 2014-01-21 NOTE — Patient Instructions (Signed)
Continue current care, no specific restrictions

## 2014-01-21 NOTE — Progress Notes (Signed)
[address]       [place] [address]             [phone]                  Hadia M Sibilia [place] Medical Record #[id number] Date of Birth: [birth year]  Referring XN:ATFTDD, Eber Hong, MD Primary Cardiology: Primary Care:Pcp Not In System  Chief Complaint:  Follow Up Visit   History of Present Illness:   Patient seen in the office in routine followup following subxiphoid pericardial window done 01/04/2014 for pericardial effusion. Pathology and cytology are negative for malignancy. She overall feels fairly well. She is on supplemental oxygen. She does remain a bit tachycardic. She has not had fevers, chills or other constitutional symptoms.         Zubrod Score: At the time of surgery this patient's most appropriate activity status/level should be described as: []     0    Normal activity, no symptoms []     1    Restricted in physical strenuous activity but ambulatory, able to do out light work []     2    Ambulatory and capable of self care, unable to do work activities, up and about                 >50 % of waking hours                                                                                   []     3    Only limited self care, in bed greater than 50% of waking hours []     4    Completely disabled, no self care, confined to bed or chair []     5    Moribund  History  Smoking status  . Former Smoker -- 1.00 packs/day for 20 years  . Types: Cigarettes  . Quit date: 06/07/1996  Smokeless tobacco  . Never Used       Allergies  Allergen Reactions  . Clinoril [Sulindac] Hives  . Morphine Itching    Current Outpatient Prescriptions  Medication Sig Dispense Refill  . albuterol (PROVENTIL HFA;VENTOLIN HFA) 108 (90 BASE) MCG/ACT inhaler Inhale 2 puffs into the lungs every 6 (six) hours as needed for wheezing.  8.5 g  5  . albuterol (PROVENTIL) (2.5 MG/3ML) 0.083% nebulizer solution Take 2.5 mg by nebulization every 6 (six) hours as needed  for wheezing or shortness of breath.      . amphetamine-dextroamphetamine (ADDERALL) 20 MG tablet Take 20 mg by mouth daily before breakfast.       . aspirin 81 MG tablet Take 81 mg by mouth daily.      . budesonide-formoterol (SYMBICORT) 160-4.5 MCG/ACT inhaler Inhale 2 puffs into the lungs 2 (two) times daily.  1 Inhaler  0  . Calcium Carb-Ergocalciferol 250-125 MG-UNIT TABS Take 1 tablet by mouth daily.       . calcium-vitamin D (OSCAL WITH D) 250-125 MG-UNIT per tablet Take 1 tablet by mouth daily.      Marland Kitchen loratadine (CLARITIN) 10 MG tablet Take 10 mg by mouth daily as  needed.       . magnesium oxide (MAG-OX) 400 MG tablet Take 1 tablet (400 mg total) by mouth daily.  60 tablet  2  . metoprolol succinate (TOPROL-XL) 25 MG 24 hr tablet Take 1 tablet (25 mg total) by mouth daily.  60 tablet  5  . Multiple Vitamins-Minerals (MULTIVITAMIN WITH MINERALS) tablet Take 1 tablet by mouth daily.      Marland Kitchen oxyCODONE-acetaminophen (PERCOCET/ROXICET) 5-325 MG per tablet Take 1 tablet by mouth every 4 (four) hours as needed for severe pain.  30 tablet  0  . traZODone (DESYREL) 50 MG tablet Take 50 mg by mouth at bedtime.      Marland Kitchen zinc gluconate 50 MG tablet Take 50 mg by mouth daily.       No current facility-administered medications for this visit.       Physical Exam: BP 138/93  Pulse 111  Ht 5\' 3"  (1.6 m)  Wt 128 lb (58.06 kg)  BMI 22.68 kg/m2  SpO2 98%  General appearance: alert, cooperative and no distress Heart: regular rate and rhythm and Tachycardia Lungs: clear to auscultation bilaterally Extremities: No edema Wound: Incision  Heart cont- no rub , no murmur  Diagnostic Studies & Laboratory data:         Recent Radiology Findings: Dg Chest 2 View  01/21/2014   CLINICAL DATA:  History pericardial effusion and lung carcinoma. Short of breath.  EXAM: CHEST  2 VIEW  COMPARISON:  01/08/2014.  FINDINGS: Scarring and right hilar retraction is stable consistent with post radiation therapy  scarring. Mild reticular scarring is noted in the left lung base. There are changes of emphysema. The lungs are hyperexpanded.  No lung consolidation. No evidence to suggest new or recurrent carcinoma. No pulmonary edema. No pleural effusion or pneumothorax.  Cardiac silhouette is normal in size.  Normal mediastinal contours.  Bony thorax is demineralized but intact.  IMPRESSION: No active cardiopulmonary disease.   Electronically Signed   By: Lajean Manes M.D.   On: 01/21/2014 14:46      Recent Labs: Lab Results  Component Value Date   WBC 9.1 01/07/2014   HGB 12.8 01/07/2014   HCT 38.8 01/07/2014   PLT 234 01/07/2014   GLUCOSE 99 01/07/2014   ALT 15 01/06/2014   AST 20 01/06/2014   NA 140 01/07/2014   K 4.4 01/07/2014   CL 100 01/07/2014   CREATININE 0.54 01/07/2014   BUN 5* 01/07/2014   CO2 30 01/07/2014   TSH 0.913 01/03/2014   INR 1.01 01/03/2014      Assessment / Plan:  The patient is overall stable. There is no current clinical evidence for recurrence. She is scheduled to see cardiology on the 28th of this month.  We can see her again when necessary for any surgically related issues or at request.          GOLD,WAYNE E 01/21/2014 3:04 PM

## 2014-01-22 ENCOUNTER — Ambulatory Visit: Payer: BC Managed Care – PPO | Admitting: Thoracic Surgery (Cardiothoracic Vascular Surgery)

## 2014-01-31 ENCOUNTER — Encounter: Payer: Self-pay | Admitting: Physician Assistant

## 2014-01-31 ENCOUNTER — Ambulatory Visit (INDEPENDENT_AMBULATORY_CARE_PROVIDER_SITE_OTHER): Payer: BC Managed Care – PPO | Admitting: Physician Assistant

## 2014-01-31 VITALS — BP 132/80 | HR 100 | Ht 65.0 in | Wt 129.0 lb

## 2014-01-31 DIAGNOSIS — R000 Tachycardia, unspecified: Secondary | ICD-10-CM

## 2014-01-31 DIAGNOSIS — I498 Other specified cardiac arrhythmias: Secondary | ICD-10-CM

## 2014-01-31 DIAGNOSIS — C3491 Malignant neoplasm of unspecified part of right bronchus or lung: Secondary | ICD-10-CM

## 2014-01-31 DIAGNOSIS — I319 Disease of pericardium, unspecified: Secondary | ICD-10-CM

## 2014-01-31 DIAGNOSIS — I313 Pericardial effusion (noninflammatory): Secondary | ICD-10-CM

## 2014-01-31 DIAGNOSIS — I3139 Other pericardial effusion (noninflammatory): Secondary | ICD-10-CM

## 2014-01-31 DIAGNOSIS — C3490 Malignant neoplasm of unspecified part of unspecified bronchus or lung: Secondary | ICD-10-CM

## 2014-01-31 NOTE — Progress Notes (Signed)
Cardiology Office Note    Date:  01/31/2014   ID:  Garyn, [place] [birth year], MRN [id number]  PCP:  Pcp Not In System  Cardiologist:  Dr. Fransico Him    History of Present Illness: Linda Donovan is a 58 y.o. 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  She was seen by [facility] and underwent subxiphoid pericardial window.  Cytology was negative for malignant cells.  She had symptomatic improvement.  She was placed on beta blocker Rx for treatment of tachycardia.  She returns for FU.  She is feeling much better.  She has chronic dyspnea.  Denies chest pain.  No orthopnea, PND, edema. No syncope.     Studies:  - Echo (01/06/14):  Poor image quality, mild LVH, EF 55-60%, pericardium could not be assessed.    Recent Labs/Images: 01/03/2014: Pro B Natriuretic peptide (BNP) 149.8*; TSH 0.913  01/06/2014: ALT 15  01/07/2014: Creatinine 0.54; Hemoglobin 12.8; Potassium 4.4   Dg Chest 2 View  01/21/2014   CLINICAL DATA:  History pericardial effusion and lung carcinoma. Short of breath.  EXAM: CHEST  2 VIEW  COMPARISON:  01/08/2014.  FINDINGS: Scarring and right hilar retraction is stable consistent with post radiation therapy scarring. Mild reticular scarring is noted in the left lung base. There are changes of emphysema. The lungs are hyperexpanded.  No lung consolidation. No evidence to suggest new or recurrent carcinoma. No pulmonary edema. No pleural effusion or pneumothorax.  Cardiac silhouette is normal in size.  Normal mediastinal contours.  Bony thorax is demineralized but intact.  IMPRESSION: No active cardiopulmonary disease.   Electronically Signed   By: Lajean Manes M.D.   On: 01/21/2014 14:46     Wt Readings from Last 3 Encounters:  01/21/14 128 lb (58.06 kg)  01/09/14 128 lb 9.6 oz (58.333 kg)  01/09/14 128 lb 9.6 oz (58.333 kg)     Past Medical History  Diagnosis Date  . COPD (chronic obstructive pulmonary disease)   . ADD (attention deficit disorder)   . Hx of radiation therapy 09/28/12- 11/17/12    RU lung mass, mediastinum chest, 63 gray 35 fx  . Non-small cell lung cancer 09/18/2012    RUL  . Shortness of breath   . Asthma   . Pneumonia   . Arthritis     Current Outpatient Prescriptions  Medication Sig Dispense Refill  . albuterol (PROVENTIL HFA;VENTOLIN HFA) 108 (90 BASE) MCG/ACT inhaler Inhale 2 puffs into the lungs every 6 (six) hours as needed for wheezing.  8.5 g  5  . albuterol (PROVENTIL) (2.5 MG/3ML) 0.083% nebulizer solution Take 2.5 mg by nebulization every 6 (six) hours as needed for wheezing or shortness of breath.      . amphetamine-dextroamphetamine (ADDERALL) 20 MG tablet Take 20 mg by mouth daily before breakfast.       . aspirin 81 MG tablet Take 81 mg by mouth daily.      . budesonide-formoterol (SYMBICORT) 160-4.5 MCG/ACT inhaler Inhale 2 puffs into the lungs 2 (two) times daily.  1 Inhaler  0  . Calcium Carb-Ergocalciferol 250-125 MG-UNIT TABS Take 1 tablet by mouth daily.       . calcium-vitamin D (OSCAL WITH D) 250-125 MG-UNIT per tablet Take 1 tablet by mouth daily.      Marland Kitchen loratadine (CLARITIN) 10 MG tablet Take 10  mg by mouth daily as needed.       . magnesium oxide (MAG-OX) 400 MG tablet Take 1 tablet (400 mg total) by mouth daily.  60 tablet  2  . metoprolol succinate (TOPROL-XL) 25 MG 24 hr tablet Take 1 tablet (25 mg total) by mouth daily.  60 tablet  5  . Multiple Vitamins-Minerals (MULTIVITAMIN WITH MINERALS) tablet Take 1 tablet by mouth daily.      Marland Kitchen oxyCODONE-acetaminophen (PERCOCET/ROXICET) 5-325 MG per tablet Take 1 tablet by mouth every 4 (four) hours as needed for severe pain.  30 tablet  0  . traZODone (DESYREL) 50 MG tablet Take 50 mg by mouth at bedtime.      Marland Kitchen zinc gluconate 50 MG tablet Take 50 mg by mouth daily.       No current facility-administered medications for  this visit.     Allergies:   Clinoril and Morphine   Social History:  The patient  reports that she quit smoking about 17 years ago. Her smoking use included Cigarettes. She has a 20 pack-year smoking history. She has never used smokeless tobacco. She reports that she drinks about .6 ounces of alcohol per week. She reports that she does not use illicit drugs.   Family History:  The patient's family history includes COPD in her paternal uncle; Cancer in her maternal grandfather and paternal grandmother; Heart disease in her maternal grandmother and mother; Prostate cancer in her maternal grandfather.   ROS:  Please see the history of present illness.      All other systems reviewed and negative.   PHYSICAL EXAM: VS:  BP 132/80  Pulse 100  Ht 5\' 5"  (1.651 m)  Wt 129 lb (58.514 kg)  BMI 21.47 kg/m2 Well nourished, well developed, in no acute distress HEENT: normal Neck: no JVD Cardiac:  distant S1, S2; RRR; no murmurno rub Lungs:  Decreased breath sounds bilaterally, no wheezing, rhonchi or rales Abd: soft, nontender, no hepatomegaly Ext: no edema Skin: warm and dry Neuro:  CNs 2-12 intact, no focal abnormalities noted  EKG:  NSR, HR 100, NSSTTW changes     ASSESSMENT AND PLAN:  Pericardial effusion s/p Subxiphoid Pericardial Window:  Symptomatically improved.    Sinus tachycardia:  HR controlled on beta blocker.   Non-small cell lung cancer, right:  FU with oncology as planned   Disposition:  FU with Dr. Fransico Him in 3 mos.    Signed, Versie Starks, MHS 01/31/2014 8:40 AM    [facility] [address], [place], [place]  [address] Phone: [phone]; Fax: [phone]

## 2014-01-31 NOTE — Patient Instructions (Signed)
Schedule follow up with Dr. Fransico Him in 3 months.  Call sooner if you have recurrent symptoms.

## 2014-02-01 ENCOUNTER — Ambulatory Visit: Payer: BC Managed Care – PPO | Admitting: Cardiology

## 2014-03-12 ENCOUNTER — Other Ambulatory Visit (HOSPITAL_BASED_OUTPATIENT_CLINIC_OR_DEPARTMENT_OTHER): Payer: BC Managed Care – PPO

## 2014-03-12 ENCOUNTER — Encounter (HOSPITAL_COMMUNITY): Payer: Self-pay

## 2014-03-12 ENCOUNTER — Ambulatory Visit (HOSPITAL_COMMUNITY)
Admission: RE | Admit: 2014-03-12 | Discharge: 2014-03-12 | Disposition: A | Discharge status: Home / Self Care | Payer: BC Managed Care – PPO | Source: Ambulatory Visit | Attending: Internal Medicine | Admitting: Internal Medicine

## 2014-03-12 ENCOUNTER — Other Ambulatory Visit: Payer: BC Managed Care – PPO

## 2014-03-12 DIAGNOSIS — C3491 Malignant neoplasm of unspecified part of right bronchus or lung: Secondary | ICD-10-CM | POA: Diagnosis not present

## 2014-03-12 DIAGNOSIS — Z9221 Personal history of antineoplastic chemotherapy: Secondary | ICD-10-CM | POA: Insufficient documentation

## 2014-03-12 DIAGNOSIS — Z923 Personal history of irradiation: Secondary | ICD-10-CM | POA: Diagnosis not present

## 2014-03-12 DIAGNOSIS — C3411 Malignant neoplasm of upper lobe, right bronchus or lung: Secondary | ICD-10-CM

## 2014-03-12 LAB — CBC WITH DIFFERENTIAL/PLATELET
BASO%: 0.4 % (ref 0.0–2.0)
BASOS ABS: 0 10*3/uL (ref 0.0–0.1)
EOS ABS: 0.1 10*3/uL (ref 0.0–0.5)
EOS%: 1.2 % (ref 0.0–7.0)
HEMATOCRIT: 42.2 % (ref 34.8–46.6)
HEMOGLOBIN: 13.9 g/dL (ref 11.6–15.9)
LYMPH%: 12.8 % — AB (ref 14.0–49.7)
MCH: 32.7 pg (ref 25.1–34.0)
MCHC: 32.8 g/dL (ref 31.5–36.0)
MCV: 99.5 fL (ref 79.5–101.0)
MONO#: 0.8 10*3/uL (ref 0.1–0.9)
MONO%: 9.6 % (ref 0.0–14.0)
NEUT#: 6.5 10*3/uL (ref 1.5–6.5)
NEUT%: 76 % (ref 38.4–76.8)
PLATELETS: 394 10*3/uL (ref 145–400)
RBC: 4.24 10*6/uL (ref 3.70–5.45)
RDW: 13.5 % (ref 11.2–14.5)
WBC: 8.6 10*3/uL (ref 3.9–10.3)
lymph#: 1.1 10*3/uL (ref 0.9–3.3)

## 2014-03-12 LAB — COMPREHENSIVE METABOLIC PANEL (CC13)
ALT: 22 U/L (ref 0–55)
AST: 27 U/L (ref 5–34)
Albumin: 3.9 g/dL (ref 3.5–5.0)
Alkaline Phosphatase: 113 U/L (ref 40–150)
Anion Gap: 9 mEq/L (ref 3–11)
BILIRUBIN TOTAL: 0.42 mg/dL (ref 0.20–1.20)
BUN: 9.5 mg/dL (ref 7.0–26.0)
CO2: 30 mEq/L — ABNORMAL HIGH (ref 22–29)
CREATININE: 0.7 mg/dL (ref 0.6–1.1)
Calcium: 10.1 mg/dL (ref 8.4–10.4)
Chloride: 99 mEq/L (ref 98–109)
GLUCOSE: 129 mg/dL (ref 70–140)
Potassium: 4 mEq/L (ref 3.5–5.1)
SODIUM: 138 meq/L (ref 136–145)
TOTAL PROTEIN: 8.2 g/dL (ref 6.4–8.3)

## 2014-03-12 MED ORDER — IOHEXOL 300 MG/ML  SOLN: 80.0000 mL | Freq: Once | INTRAMUSCULAR | Status: DC | PRN

## 2014-03-12 MED ORDER — IOHEXOL 300 MG/ML  SOLN
80.0000 mL | Freq: Once | INTRAMUSCULAR | Status: AC | PRN
  Administered 2014-03-12: 80 mL via INTRAVENOUS

## 2014-03-19 ENCOUNTER — Ambulatory Visit (HOSPITAL_BASED_OUTPATIENT_CLINIC_OR_DEPARTMENT_OTHER): Payer: BC Managed Care – PPO

## 2014-03-19 ENCOUNTER — Ambulatory Visit (HOSPITAL_BASED_OUTPATIENT_CLINIC_OR_DEPARTMENT_OTHER): Payer: BC Managed Care – PPO | Admitting: Internal Medicine

## 2014-03-19 ENCOUNTER — Telehealth: Payer: Self-pay | Admitting: Internal Medicine

## 2014-03-19 VITALS — BP 158/87 | HR 79 | Temp 98.0°F | Resp 19 | Ht 65.0 in | Wt 133.3 lb

## 2014-03-19 DIAGNOSIS — C3491 Malignant neoplasm of unspecified part of right bronchus or lung: Secondary | ICD-10-CM

## 2014-03-19 DIAGNOSIS — I319 Disease of pericardium, unspecified: Secondary | ICD-10-CM

## 2014-03-19 DIAGNOSIS — Z23 Encounter for immunization: Secondary | ICD-10-CM

## 2014-03-19 MED ORDER — INFLUENZA VAC SPLIT QUAD 0.5 ML IM SUSY
0.5000 mL | PREFILLED_SYRINGE | Freq: Once | INTRAMUSCULAR | Status: AC
  Administered 2014-03-19: 0.5 mL via INTRAMUSCULAR
  Filled 2014-03-19: qty 0.5

## 2014-03-19 MED ORDER — OXYCODONE-ACETAMINOPHEN 5-325 MG PO TABS: 1.0000 | ORAL_TABLET | ORAL | Status: DC | PRN

## 2014-03-19 MED ORDER — HYDROCODONE-HOMATROPINE 5-1.5 MG/5ML PO SYRP: 5.0000 mL | ORAL_SOLUTION | Freq: Four times a day (QID) | ORAL | Status: DC | PRN

## 2014-03-19 NOTE — Progress Notes (Signed)
[facility] Telephone:([phone]) [phone]   Fax:(336) [phone]  OFFICE PROGRESS NOTE   DIAGNOSIS: Stage IIIA non-small cell lung cancer, adenocarcinoma with negative EGFR mutation and negative ALK gene translocation diagnosed in March of 2014   PRIOR THERAPY:  1) Concurrent chemoradiation with weekly carboplatin for AUC of 2 and paclitaxel 45 mg/M2, status post 8 cycles, last dose was given on 11/13/2012 with partial response.  2) Consolidation chemotherapy with carboplatin for AUC of 5 on day 1 and gemcitabine 1000 mg/M2 on days 1 and 8 every 3 weeks, status post 3 cycles, last dose was given 02/20/2013 with mild improvement in her disease . First cycle was given on 01/02/2013.  3) status post subxiphoid pericardial window under the care of Dr. Roxan Hockey on 01/04/2014 and the final pathology showed no evidence for malignancy.  CURRENT THERAPY: Observation  CHEMOTHERAPY INTENT: Control  CURRENT # OF CHEMOTHERAPY CYCLES: 0  CURRENT ANTIEMETICS: Zofran, dexamethasone and Compazine  CURRENT SMOKING STATUS: Former smoker, quit in [place]: None  CURRENT BISPHOSPHONATES USE: None  PAIN MANAGEMENT: 8/10, currently Percocet 5/325, 1-2 tabs q 4 hours  NARCOTICS INDUCED CONSTIPATION: None  LIVING WILL AND CODE STATUS: Full code  INTERVAL HISTORY: Linda Donovan 58 y.o. female returns to the clinic today for followup visit accompanied by her husband. She is feeling fine today except for persistent cough productive of whitish sputum and pain on the right shoulder.blade. She denied having any significant shortness of breath chest pain or hemoptysis. She denied having any nausea or vomiting, no fever or chills. She has no weight loss or night sweats. She had repeat CT scan of the chest performed recently and she is here for evaluation and discussion of her scan results.  MEDICAL HISTORY: Past Medical History  Diagnosis Date  . COPD (chronic obstructive  pulmonary disease)   . ADD (attention deficit disorder)   . Hx of radiation therapy 09/28/12- 11/17/12    RU lung mass, mediastinum chest, 63 gray 35 fx  . Shortness of breath   . Asthma   . Pneumonia   . Arthritis   . Non-small cell lung cancer 09/18/2012    RUL    ALLERGIES:  is allergic to clinoril and morphine.  MEDICATIONS:  Current Outpatient Prescriptions  Medication Sig Dispense Refill  . albuterol (PROVENTIL HFA;VENTOLIN HFA) 108 (90 BASE) MCG/ACT inhaler Inhale 2 puffs into the lungs every 6 (six) hours as needed for wheezing.  8.5 g  5  . albuterol (PROVENTIL) (2.5 MG/3ML) 0.083% nebulizer solution Take 2.5 mg by nebulization every 6 (six) hours as needed for wheezing or shortness of breath.      . amphetamine-dextroamphetamine (ADDERALL) 20 MG tablet Take 20 mg by mouth daily before breakfast.       . aspirin 81 MG tablet Take 81 mg by mouth daily.      . budesonide-formoterol (SYMBICORT) 160-4.5 MCG/ACT inhaler Inhale 2 puffs into the lungs 2 (two) times daily.  1 Inhaler  0  . Calcium Carb-Ergocalciferol 250-125 MG-UNIT TABS Take 1 tablet by mouth daily.       . calcium-vitamin D (OSCAL WITH D) 250-125 MG-UNIT per tablet Take 1 tablet by mouth daily.      . magnesium oxide (MAG-OX) 400 MG tablet Take 1 tablet (400 mg total) by mouth daily.  60 tablet  2  . metoprolol succinate (TOPROL-XL) 25 MG 24 hr tablet Take 1 tablet (25 mg total) by mouth daily.  60 tablet  5  . Multiple Vitamins-Minerals (MULTIVITAMIN WITH MINERALS) tablet Take 1 tablet by mouth daily.      Marland Kitchen oxyCODONE-acetaminophen (PERCOCET/ROXICET) 5-325 MG per tablet Take 1 tablet by mouth every 4 (four) hours as needed for severe pain.  30 tablet  0  . OXYGEN Inhale 2 L into the lungs continuous.      . traZODone (DESYREL) 50 MG tablet Take 50 mg by mouth at bedtime.      Marland Kitchen UNABLE TO FIND Please draw:  hepatitis C RNA and viral load and fax results to [phone]. Dr. Fayette Pho. [phone]      . zinc  gluconate 50 MG tablet Take 50 mg by mouth daily.       No current facility-administered medications for this visit.   Facility-Administered Medications Ordered in Other Visits  Medication Dose Route Frequency Provider Last Rate Last Dose  . Influenza vac split quadrivalent PF (FLUARIX) injection 0.5 mL  0.5 mL Intramuscular Once Curt Bears, MD        SURGICAL HISTORY:  Past Surgical History  Procedure Laterality Date  . Total abdominal hysterectomy    . Shoulder arthroscopy    . Video bronchoscopy Bilateral 07/25/2012    Procedure: VIDEO BRONCHOSCOPY WITH FLUORO;  Surgeon: Tanda Rockers, MD;  Location: [facility];  Service: Endoscopy;  Laterality: Bilateral;  . Lump removed from breasr right  2003  . Endobronchial ultrasound Bilateral 09/04/2012    Procedure: ENDOBRONCHIAL ULTRASOUND;  Surgeon: Collene Gobble, MD;  Location: [facility];  Service: Cardiopulmonary;  Laterality: Bilateral;  . Breast surgery    . Subxyphoid pericardial window N/A 01/04/2014    Procedure: SUBXYPHOID PERICARDIAL WINDOW;  Surgeon: [name], MD;  Location: [facility];  Service: Thoracic;  Laterality: N/A;  . Intraoperative transesophageal echocardiogram N/A 01/04/2014    Procedure: INTRAOPERATIVE TRANSESOPHAGEAL ECHOCARDIOGRAM;  Surgeon: [name], MD;  Location: [facility];  Service: Open Heart Surgery;  Laterality: N/A;    REVIEW OF SYSTEMS:  A comprehensive review of systems was negative except for: Respiratory: positive for cough Musculoskeletal: positive for bone pain   PHYSICAL EXAMINATION: General appearance: alert, cooperative, fatigued and no distress Head: Normocephalic, without obvious abnormality, atraumatic Neck: no adenopathy, no JVD, supple, symmetrical, trachea midline and thyroid not enlarged, symmetric, no tenderness/mass/nodules Lymph nodes: Cervical, supraclavicular, and axillary nodes normal. Resp: clear to auscultation bilaterally and normal percussion  bilaterally Back: symmetric, no curvature. ROM normal. No CVA tenderness. Cardio: regular rate and rhythm, S1, S2 normal, no murmur, click, rub or gallop and normal apical impulse GI: soft, non-tender; bowel sounds normal; no masses,  no organomegaly Extremities: extremities normal, atraumatic, no cyanosis or edema Neurologic: Alert and oriented X 3, normal strength and tone. Normal symmetric reflexes. Normal coordination and gait  ECOG PERFORMANCE STATUS: 1 - Symptomatic but completely ambulatory  Blood pressure 158/87, pulse 79, temperature 98 F (36.7 C), temperature source Oral, resp. rate 19, height _0  (1.651 m), weight 133 lb 4.8 oz (60.464 kg), SpO2 99.00%.  LABORATORY DATA: Lab Results  Component Value Date   WBC 8.6 03/12/2014   HGB 13.9 03/12/2014   HCT 42.2 03/12/2014   MCV 99.5 03/12/2014   PLT 394 03/12/2014      Chemistry      Component Value Date/Time   NA 138 03/12/2014 0905   NA 140 01/07/2014 0233   K 4.0 03/12/2014 0905   K 4.4 01/07/2014 0233   CL 100 01/07/2014 0233   CL 104  11/13/2012 1122   CO2 30* 03/12/2014 0905   CO2 30 01/07/2014 0233   BUN 9.5 03/12/2014 0905   BUN 5* 01/07/2014 0233   CREATININE 0.7 03/12/2014 0905   CREATININE 0.54 01/07/2014 0233      Component Value Date/Time   CALCIUM 10.1 03/12/2014 0905   CALCIUM 8.9 01/07/2014 0233   ALKPHOS 113 03/12/2014 0905   ALKPHOS 83 01/06/2014 0311   AST 27 03/12/2014 0905   AST 20 01/06/2014 0311   ALT 22 03/12/2014 0905   ALT 15 01/06/2014 0311   BILITOT 0.42 03/12/2014 0905   BILITOT 0.3 01/06/2014 0311       RADIOGRAPHIC STUDIES: Ct Chest W Contrast  03/12/2014   CLINICAL DATA:  Followup right non-small cell lung carcinoma. Status post chemotherapy and radiation therapy.  EXAM: CT CHEST WITH CONTRAST  TECHNIQUE: Multidetector CT imaging of the chest was performed during intravenous contrast administration.  CONTRAST:  29m OMNIPAQUE IOHEXOL 300 MG/ML  SOLN  COMPARISON:  12/05/2013  FINDINGS: Mediastinum/Hilar  Regions: Mild subcarinal lymphadenopathy shows no significant change measuring 1.8 cm on image 25. No other pathologically enlarged mediastinal or hilar lymph nodes identified.  Other Thoracic Lymphadenopathy:  None.  Lungs: Severe emphysema again noted. Ill-defined masslike opacity in the posterior right upper lobe currently measures 2.4 x 4.8 cm on image 16 compared to 2.5 x 5.5 cm previously.  Small sub-cm nodular densities in the right middle lobe on images 34 and 37 remain stable. No new or enlarging pulmonary nodules or masses identified.  Pleura:  No evidence of effusion or mass.  Vascular/Cardiac:  No acute findings identified.  Musculoskeletal: No suspicious bone lesions identified. Old upper thoracic vertebral body compression fracture deformity again noted.  Other:  None.  IMPRESSION: Mild decrease in size of ill-defined masslike opacity in the posterior right upper lobe.  Stable mild subcarinal mediastinal lymphadenopathy.  No new or progressive disease identified within the thorax.  Severe emphysema.   Electronically Signed   By: JEarle GellM.D.   On: 03/12/2014 11:12   ASSESSMENT AND PLAN: This is a very pleasant 58years old white female with stage IIIA non-small cell lung cancer, adenocarcinoma with negative EGFR mutation and negative ALK gene translocation is status post concurrent chemoradiation with weekly carboplatin and paclitaxel followed by consolidation chemotherapy with 3 cycles of carboplatin and gemcitabine. She underwent pericardial window for pericardial effusion in July 2015 and the final fluid cytology was negative for malignancy. I discussed the scan results and showed the images to the patient and her family. I recommended for her to continue on observation for now with repeat CT scan of the chest in 4 months. Regarding the pericardial effusion, I will refer the patient to cardiology for evaluation. The patient was advised to call immediately if she has any concerning symptoms  in the interval.  The patient voices understanding of current disease status and treatment options and is in agreement with the current care plan.  All questions were answered. The patient knows to call the clinic with any problems, questions or concerns. We can certainly see the patient much sooner if necessary.  Disclaimer: This note was dictated with voice recognition software. Similar sounding words can inadvertently be transcribed and may not be corrected upon review.

## 2014-03-19 NOTE — Telephone Encounter (Signed)
Pt confirmed labs/ov per 10/13 POF, gave pt AVS.... KJ

## 2014-04-26 ENCOUNTER — Ambulatory Visit: Payer: BC Managed Care – PPO | Admitting: Cardiology

## 2014-06-20 ENCOUNTER — Telehealth: Payer: Self-pay | Admitting: Medical Oncology

## 2014-06-20 NOTE — Telephone Encounter (Signed)
I referred her to her PCP. She said her PCP is in [place] and she does not want to go there. Per Dr Julien Nordmann.I told pt to go see PCP or  an Urgent care. She said Okay.

## 2014-07-17 ENCOUNTER — Ambulatory Visit (HOSPITAL_COMMUNITY)
Admission: RE | Admit: 2014-07-17 | Discharge: 2014-07-17 | Disposition: A | Discharge status: Home / Self Care | Payer: BLUE CROSS/BLUE SHIELD | Source: Ambulatory Visit | Attending: Internal Medicine | Admitting: Internal Medicine

## 2014-07-17 ENCOUNTER — Encounter (HOSPITAL_COMMUNITY): Payer: Self-pay

## 2014-07-17 ENCOUNTER — Other Ambulatory Visit (HOSPITAL_BASED_OUTPATIENT_CLINIC_OR_DEPARTMENT_OTHER): Payer: BLUE CROSS/BLUE SHIELD

## 2014-07-17 ENCOUNTER — Other Ambulatory Visit: Payer: BC Managed Care – PPO

## 2014-07-17 DIAGNOSIS — Z9221 Personal history of antineoplastic chemotherapy: Secondary | ICD-10-CM | POA: Insufficient documentation

## 2014-07-17 DIAGNOSIS — Z923 Personal history of irradiation: Secondary | ICD-10-CM | POA: Insufficient documentation

## 2014-07-17 DIAGNOSIS — C3491 Malignant neoplasm of unspecified part of right bronchus or lung: Secondary | ICD-10-CM | POA: Insufficient documentation

## 2014-07-17 DIAGNOSIS — R05 Cough: Secondary | ICD-10-CM | POA: Diagnosis not present

## 2014-07-17 DIAGNOSIS — C3411 Malignant neoplasm of upper lobe, right bronchus or lung: Secondary | ICD-10-CM

## 2014-07-17 DIAGNOSIS — R0602 Shortness of breath: Secondary | ICD-10-CM | POA: Insufficient documentation

## 2014-07-17 DIAGNOSIS — Z87891 Personal history of nicotine dependence: Secondary | ICD-10-CM | POA: Insufficient documentation

## 2014-07-17 LAB — CBC WITH DIFFERENTIAL/PLATELET
BASO%: 0.4 % (ref 0.0–2.0)
Basophils Absolute: 0 10*3/uL (ref 0.0–0.1)
EOS ABS: 0.1 10*3/uL (ref 0.0–0.5)
EOS%: 1.1 % (ref 0.0–7.0)
HCT: 41.1 % (ref 34.8–46.6)
HGB: 13.4 g/dL (ref 11.6–15.9)
LYMPH%: 9.8 % — ABNORMAL LOW (ref 14.0–49.7)
MCH: 33 pg (ref 25.1–34.0)
MCHC: 32.7 g/dL (ref 31.5–36.0)
MCV: 101.2 fL — ABNORMAL HIGH (ref 79.5–101.0)
MONO#: 1.1 10*3/uL — ABNORMAL HIGH (ref 0.1–0.9)
MONO%: 12.9 % (ref 0.0–14.0)
NEUT%: 75.8 % (ref 38.4–76.8)
NEUTROS ABS: 6.2 10*3/uL (ref 1.5–6.5)
Platelets: 326 10*3/uL (ref 145–400)
RBC: 4.06 10*6/uL (ref 3.70–5.45)
RDW: 12.6 % (ref 11.2–14.5)
WBC: 8.2 10*3/uL (ref 3.9–10.3)
lymph#: 0.8 10*3/uL — ABNORMAL LOW (ref 0.9–3.3)

## 2014-07-17 LAB — COMPREHENSIVE METABOLIC PANEL (CC13)
ALT: 21 U/L (ref 0–55)
ANION GAP: 12 meq/L — AB (ref 3–11)
AST: 26 U/L (ref 5–34)
Albumin: 3.7 g/dL (ref 3.5–5.0)
Alkaline Phosphatase: 129 U/L (ref 40–150)
BUN: 7.9 mg/dL (ref 7.0–26.0)
CALCIUM: 9.8 mg/dL (ref 8.4–10.4)
CO2: 27 meq/L (ref 22–29)
CREATININE: 0.7 mg/dL (ref 0.6–1.1)
Chloride: 101 mEq/L (ref 98–109)
EGFR: >90 mL/min/{1.73_m2} (ref >90)
GLUCOSE: 112 mg/dL (ref 70–140)
Potassium: 4.1 mEq/L (ref 3.5–5.1)
Sodium: 140 mEq/L (ref 136–145)
Total Bilirubin: 0.57 mg/dL (ref 0.20–1.20)
Total Protein: 7.9 g/dL (ref 6.4–8.3)

## 2014-07-17 MED ORDER — IOHEXOL 300 MG/ML  SOLN
100.0000 mL | Freq: Once | INTRAMUSCULAR | Status: AC | PRN
  Administered 2014-07-17: 100 mL via INTRAVENOUS

## 2014-07-22 ENCOUNTER — Ambulatory Visit: Payer: BC Managed Care – PPO | Admitting: Internal Medicine

## 2014-07-22 ENCOUNTER — Other Ambulatory Visit: Payer: Self-pay | Admitting: Medical Oncology

## 2014-07-22 ENCOUNTER — Telehealth: Payer: Self-pay | Admitting: Internal Medicine

## 2014-07-22 ENCOUNTER — Encounter: Payer: Self-pay | Admitting: Internal Medicine

## 2014-07-22 ENCOUNTER — Ambulatory Visit (HOSPITAL_BASED_OUTPATIENT_CLINIC_OR_DEPARTMENT_OTHER): Payer: BLUE CROSS/BLUE SHIELD | Admitting: Internal Medicine

## 2014-07-22 VITALS — BP 163/70 | HR 109 | Temp 98.2°F | Resp 18 | Ht 65.0 in | Wt 137.9 lb

## 2014-07-22 DIAGNOSIS — C3411 Malignant neoplasm of upper lobe, right bronchus or lung: Secondary | ICD-10-CM

## 2014-07-22 DIAGNOSIS — C3491 Malignant neoplasm of unspecified part of right bronchus or lung: Secondary | ICD-10-CM

## 2014-07-22 MED ORDER — OXYCODONE-ACETAMINOPHEN 5-325 MG PO TABS: 1.0000 | ORAL_TABLET | ORAL | Status: DC | PRN

## 2014-07-22 MED ORDER — HYDROCODONE-HOMATROPINE 5-1.5 MG/5ML PO SYRP: 5.0000 mL | ORAL_SOLUTION | Freq: Four times a day (QID) | ORAL | Status: DC | PRN

## 2014-07-22 NOTE — Telephone Encounter (Signed)
Patient confirmed d/t for 03/08. Sent to RN to speak with in reguards to sooner appointment.

## 2014-07-22 NOTE — Progress Notes (Signed)
[place] [phone]   [phone]  OFFICE PROGRESS NOTE   DIAGNOSIS: Stage IIIA non-small cell lung cancer, adenocarcinoma with negative EGFR mutation and negative ALK gene translocation diagnosed in March of 2014   PRIOR THERAPY:  1) Concurrent chemoradiation with weekly carboplatin for AUC of 2 and paclitaxel 45 mg/M2, status post 8 cycles, last dose was given on 11/13/2012 with partial response.  2) Consolidation chemotherapy with carboplatin for AUC of 5 on day 1 and gemcitabine 1000 mg/M2 on days 1 and 8 every 3 weeks, status post 3 cycles, last dose was given 02/20/2013 with mild improvement in her disease . First cycle was given on 01/02/2013.  3) status post subxiphoid pericardial window under the care of Dr. Roxan Hockey on 01/04/2014 and the final pathology showed no evidence for malignancy.  CURRENT THERAPY: Observation  CHEMOTHERAPY INTENT: Control  CURRENT # OF CHEMOTHERAPY CYCLES: 0  CURRENT ANTIEMETICS: Zofran, dexamethasone and Compazine  CURRENT SMOKING STATUS: Former smoker, quit in [place]: None  CURRENT BISPHOSPHONATES USE: None  PAIN MANAGEMENT: 8/10, currently Percocet 5/325, 1-2 tabs q 4 hours  NARCOTICS INDUCED CONSTIPATION: None  LIVING WILL AND CODE STATUS: Full code  INTERVAL HISTORY: LEGACI TARMAN 59 y.o. female returns to the clinic today for followup visit accompanied by her husband. She is feeling fine today except for cough productive of whitish and occasional brownish sputum. She still have shortness of breath and currently on home oxygen. She denied having any nausea or vomiting, no fever or chills. She has no weight loss or night sweats. She had repeat CT scan of the chest performed recently and she is here for evaluation and discussion of her scan results.  MEDICAL HISTORY: Past Medical History  Diagnosis Date  . COPD (chronic obstructive pulmonary disease)   . ADD (attention  deficit disorder)   . Hx of radiation therapy 09/28/12- 11/17/12    RU lung mass, mediastinum chest, 63 gray 35 fx  . Shortness of breath   . Asthma   . Pneumonia   . Arthritis   . Non-small cell lung cancer 09/18/2012    RUL  . Asthma     ALLERGIES:  is allergic to clinoril and morphine.  MEDICATIONS:  Current Outpatient Prescriptions  Medication Sig Dispense Refill  . albuterol (PROVENTIL HFA;VENTOLIN HFA) 108 (90 BASE) MCG/ACT inhaler Inhale 2 puffs into the lungs every 6 (six) hours as needed for wheezing. 8.5 g 5  . albuterol (PROVENTIL) (2.5 MG/3ML) 0.083% nebulizer solution Take 2.5 mg by nebulization every 6 (six) hours as needed for wheezing or shortness of breath.    . amphetamine-dextroamphetamine (ADDERALL) 20 MG tablet Take 20 mg by mouth daily before breakfast.     . aspirin 81 MG tablet Take 81 mg by mouth daily.    . budesonide-formoterol (SYMBICORT) 160-4.5 MCG/ACT inhaler Inhale 2 puffs into the lungs 2 (two) times daily. 1 Inhaler 0  . Calcium Carb-Ergocalciferol 250-125 MG-UNIT TABS Take 1 tablet by mouth daily.     . calcium-vitamin D (OSCAL WITH D) 250-125 MG-UNIT per tablet Take 1 tablet by mouth daily.    Marland Kitchen HYDROcodone-homatropine (HYCODAN) 5-1.5 MG/5ML syrup Take 5 mLs by mouth every 6 (six) hours as needed for cough. 120 mL 0  . magnesium oxide (MAG-OX) 400 MG tablet Take 1 tablet (400 mg total) by mouth daily. 60 tablet 2  . metoprolol succinate (TOPROL-XL) 25 MG 24 hr tablet Take 1 tablet (25  mg total) by mouth daily. 60 tablet 5  . Multiple Vitamins-Minerals (MULTIVITAMIN WITH MINERALS) tablet Take 1 tablet by mouth daily.    Marland Kitchen oxyCODONE-acetaminophen (PERCOCET/ROXICET) 5-325 MG per tablet Take 1 tablet by mouth every 4 (four) hours as needed for severe pain. 30 tablet 0  . OXYGEN Inhale 2 L into the lungs continuous.    . traZODone (DESYREL) 50 MG tablet Take 50 mg by mouth at bedtime.    Marland Kitchen zinc gluconate 50 MG tablet Take 50 mg by mouth daily.     No  current facility-administered medications for this visit.    SURGICAL HISTORY:  Past Surgical History  Procedure Laterality Date  . Total abdominal hysterectomy    . Shoulder arthroscopy    . Video bronchoscopy Bilateral 07/25/2012    Procedure: VIDEO BRONCHOSCOPY WITH FLUORO;  Surgeon: Tanda Rockers, MD;  Location: [facility];  Service: Endoscopy;  Laterality: Bilateral;  . Lump removed from breasr right  2003  . Endobronchial ultrasound Bilateral 09/04/2012    Procedure: ENDOBRONCHIAL ULTRASOUND;  Surgeon: Collene Gobble, MD;  Location: [facility];  Service: Cardiopulmonary;  Laterality: Bilateral;  . Breast surgery    . Subxyphoid pericardial window N/A 01/04/2014    Procedure: SUBXYPHOID PERICARDIAL WINDOW;  Surgeon: [name], MD;  Location: [place];  Service: Thoracic;  Laterality: N/A;  . Intraoperative transesophageal echocardiogram N/A 01/04/2014    Procedure: INTRAOPERATIVE TRANSESOPHAGEAL ECHOCARDIOGRAM;  Surgeon: [name], MD;  Location: [place];  Service: Open Heart Surgery;  Laterality: N/A;    REVIEW OF SYSTEMS:  A comprehensive review of systems was negative except for: Respiratory: positive for cough, dyspnea on exertion and sputum   PHYSICAL EXAMINATION: General appearance: alert, cooperative, fatigued and no distress Head: Normocephalic, without obvious abnormality, atraumatic Neck: no adenopathy, no JVD, supple, symmetrical, trachea midline and thyroid not enlarged, symmetric, no tenderness/mass/nodules Lymph nodes: Cervical, supraclavicular, and axillary nodes normal. Resp: clear to auscultation bilaterally and normal percussion bilaterally Back: symmetric, no curvature. ROM normal. No CVA tenderness. Cardio: regular rate and rhythm, S1, S2 normal, no murmur, click, rub or gallop and normal apical impulse GI: soft, non-tender; bowel sounds normal; no masses,  no organomegaly Extremities: extremities normal, atraumatic, no cyanosis or  edema Neurologic: Alert and oriented X 3, normal strength and tone. Normal symmetric reflexes. Normal coordination and gait  ECOG PERFORMANCE STATUS: 1 - Symptomatic but completely ambulatory  Blood pressure 163/70, pulse 109, temperature 98.2 F (36.8 C), temperature source Oral, resp. rate 18, height $RemoveBe'5\' 5"'zzFXUVMbF$  (1.651 m), weight 137 lb 14.4 oz (62.551 kg), SpO2 85 %.  LABORATORY DATA: Lab Results  Component Value Date   WBC 8.2 07/17/2014   HGB 13.4 07/17/2014   HCT 41.1 07/17/2014   MCV 101.2* 07/17/2014   PLT 326 07/17/2014      Chemistry      Component Value Date/Time   NA 140 07/17/2014 0901   NA 140 01/07/2014 0233   K 4.1 07/17/2014 0901   K 4.4 01/07/2014 0233   CL 100 01/07/2014 0233   CL 104 11/13/2012 1122   CO2 27 07/17/2014 0901   CO2 30 01/07/2014 0233   BUN 7.9 07/17/2014 0901   BUN 5* 01/07/2014 0233   CREATININE 0.7 07/17/2014 0901   CREATININE 0.54 01/07/2014 0233      Component Value Date/Time   CALCIUM 9.8 07/17/2014 0901   CALCIUM 8.9 01/07/2014 0233   ALKPHOS 129 07/17/2014 0901   ALKPHOS 83 01/06/2014 0311   AST  26 07/17/2014 0901   AST 20 01/06/2014 0311   ALT 21 07/17/2014 0901   ALT 15 01/06/2014 0311   BILITOT 0.57 07/17/2014 0901   BILITOT 0.3 01/06/2014 0311       RADIOGRAPHIC STUDIES: Ct Chest W Contrast  07/17/2014   CLINICAL DATA:  Followup right non-small cell lung carcinoma. Cough and shortness of breath. Please chemotherapy and radiation therapy.  EXAM: CT CHEST WITH CONTRAST  TECHNIQUE: Multidetector CT imaging of the chest was performed during intravenous contrast administration.  CONTRAST:  186mL OMNIPAQUE IOHEXOL 300 MG/ML  SOLN  COMPARISON:  03/12/2014  FINDINGS: Mediastinum/Lymph Nodes: Mediastinal lymphadenopathy and subcarinal regions increased, currently measuring 2.4 cm on image 25 compared to 1.8 cm previously. A new 9 mm right supraclavicular lymph node is also seen on image 17/series 2.  Lungs/Pleura: Increased size of  ill-defined masslike opacity is seen in the posterior right upper lung field which has involvement of the right hilum. This measures 5.5 x 4.9 cm on image 18/series 2 compared to 2.4 x 4.8 cm previously. Fluid is also seen within several subpleural blebs in the posterior right lung apex which is new. Severe emphysema again demonstrated.  Musculoskeletal/Soft Tissues: No suspicious bone lesions or other significant chest wall abnormality.  Upper Abdomen:  Unremarkable.  IMPRESSION: Increased size of ill-defined mass in the posterior right upper lung field and involvement of the right hilum.  Increased subcarinal mediastinal lymphadenopathy and new mild right supraclavicular lymphadenopathy.  Severe emphysema, with new fluid filled blebs noted in the posterior right lung apex.   Electronically Signed   By: Earle Gell M.D.   On: 07/17/2014 11:13    ASSESSMENT AND PLAN: This is a very pleasant 59 years old white female with stage IIIA non-small cell lung cancer, adenocarcinoma with negative EGFR mutation and negative ALK gene translocation is status post concurrent chemoradiation with weekly carboplatin and paclitaxel followed by consolidation chemotherapy with 3 cycles of carboplatin and gemcitabine. The recent CT scan of the chest showed increase in the size of the right upper lobe lung mass as well as the subcarinal lymphadenopathy. I discussed the scan results and showed the images to the patient and her husband. I recommended for her to have repeat PET scan as well as MRI of the brain for reevaluation of her disease. I may consider the patient for treatment was carboplatin and Alimta for her disease recurrence. This will be discussed with her in more details at the upcoming visit in 2 weeks. The patient was advised to call immediately if she has any concerning symptoms in the interval.  The patient voices understanding of current disease status and treatment options and is in agreement with the current  care plan.  All questions were answered. The patient knows to call the clinic with any problems, questions or concerns. We can certainly see the patient much sooner if necessary.  Disclaimer: This note was dictated with voice recognition software. Similar sounding words can inadvertently be transcribed and may not be corrected upon review.

## 2014-07-22 NOTE — Progress Notes (Signed)
Refills give to pt.

## 2014-08-01 ENCOUNTER — Encounter: Payer: Self-pay | Admitting: Adult Health

## 2014-08-01 ENCOUNTER — Telehealth: Payer: Self-pay | Admitting: Emergency Medicine

## 2014-08-01 ENCOUNTER — Ambulatory Visit (INDEPENDENT_AMBULATORY_CARE_PROVIDER_SITE_OTHER): Payer: BLUE CROSS/BLUE SHIELD | Admitting: Adult Health

## 2014-08-01 ENCOUNTER — Ambulatory Visit (INDEPENDENT_AMBULATORY_CARE_PROVIDER_SITE_OTHER)
Admission: RE | Admit: 2014-08-01 | Discharge: 2014-08-01 | Disposition: A | Discharge status: Home / Self Care | Payer: BLUE CROSS/BLUE SHIELD | Source: Ambulatory Visit | Attending: Adult Health | Admitting: Adult Health

## 2014-08-01 VITALS — BP 118/82 | HR 100 | Temp 99.0°F | Ht 63.0 in | Wt 136.0 lb

## 2014-08-01 DIAGNOSIS — J441 Chronic obstructive pulmonary disease with (acute) exacerbation: Secondary | ICD-10-CM

## 2014-08-01 DIAGNOSIS — C3491 Malignant neoplasm of unspecified part of right bronchus or lung: Secondary | ICD-10-CM

## 2014-08-01 MED ORDER — MOXIFLOXACIN HCL 400 MG PO TABS: 400.0000 mg | ORAL_TABLET | Freq: Every day | ORAL | Status: DC

## 2014-08-01 MED ORDER — METHYLPREDNISOLONE ACETATE 80 MG/ML IJ SUSP
120.0000 mg | Freq: Once | INTRAMUSCULAR | Status: AC
  Administered 2014-08-01: 120 mg via INTRAMUSCULAR

## 2014-08-01 MED ORDER — ALBUTEROL SULFATE HFA 108 (90 BASE) MCG/ACT IN AERS: 2.0000 | INHALATION_SPRAY | Freq: Four times a day (QID) | RESPIRATORY_TRACT | Status: DC | PRN

## 2014-08-01 MED ORDER — LEVALBUTEROL HCL 0.63 MG/3ML IN NEBU
0.6300 mg | INHALATION_SOLUTION | Freq: Once | RESPIRATORY_TRACT | Status: AC
  Administered 2014-08-01: 0.63 mg via RESPIRATORY_TRACT

## 2014-08-01 MED ORDER — PREDNISONE 10 MG PO TABS: ORAL_TABLET | ORAL | Status: DC

## 2014-08-01 NOTE — Assessment & Plan Note (Addendum)
Severe COPD O2 dependent w/ exacerbation  xopenex neb x 1 in office  Depo medrol 120mg  IM x 1   Plan  Avelox 400mg  daily for 7 days  Mucinex DM Twice daily  As needed  Cough/congestion  Prednisone taper over next week.  Fluids and rest  Tylenol As needed   Please contact office for sooner follow up if symptoms do not improve or worsen or seek emergency care  follow up Dr. Lamonte Sakai  In 6 weeks and As needed    Discussed with patient and husband that if symptoms are not improving, will need hospitalization.

## 2014-08-01 NOTE — Telephone Encounter (Signed)
Spoke with the pt  She is c/o increased SOB, cough and congestion for the past 2 days  OV with TP at 4 pm today

## 2014-08-01 NOTE — Progress Notes (Signed)
Subjective:    Patient ID: Linda Donovan, female    DOB: Jan 17, 1956, 59 y.o.   MRN: [id number]  HPI Primary Linda Donovan  62 yowf quit smoking 1998 with sob that improved to point were stopped all medications x rare  saba  self referred to pulmonary clinic for hemoptysis.  1st pulmonary eval in EMR era/ [facility]  Sob with inclines and cold weather and steps and no increase in saba indolent onset rattling cough and throat congestion onset mid Jan 2014 with white phlegm streaked with blood and sev times a week esp in am's and no epistaxis. No more blood x 5 days, ? Back of throat.   No obvious pattern to  variabilty or assoc sorethroat cp or chest tightness, subjective wheeze overt sinus or hb symptoms. No unusual exp hx or h/o childhood pna/ asthma or premature birth to his knowledge.   ROV 08/25/12 -- 59 yo former smoker, seen by Dr Melvyn Novas for hemoptysis. 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 S/p FOB that was non-diagnostic. She is referred after PET scan to consider another bx. Her hemoptysis has resolved.  She is c/o nasal gtt.   ROV 09/25/12 -- 59 yo former smoker w new dx adenoCA by FOB beginning of April. She is undergoing rx with Dr Julien Nordmann, concurrent chemo + XRT. She is very concerned that her FOB's aren't covered by [employer]. No more hemoptysis.  She has exertional dyspnea.    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 Also stage III lung cancer on chemotherapy  On sept,  23rd 2014 she had second cycle chemotherapy. On the following did she develop shortness of breath and was up and given some prednisone that she completed a few days ago but since then she's had worsening shortness of breath along with increased cough and some change in sputum to green color. She was addressed with emergency room but she refused. So I am seeing acutely. On arrival she was 60% on room air at rest but corrected with 2 L of oxygen. Despite  her severe COPD status prior hypoxemia has not been documented although she insists otherwise and says that hypoxemia stable and unchanged and baseline. He denies any fever, hemoptysis, orthopnea, paroxysmal nocturnal dyspnea edema. Symptoms are associated with severe fatigue  Strongly recommended admission but she flatly refused and accpeted liability for any roblems that might arise. 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   Follow up 03/15/13 --  Patient returns for a one-week followup. Patient was seen last week with a COPD exacerbation. She was treated with Levaquin and a prednisone taper. Chest x-ray shows COPD changes, and decreased right upper lobe tumor size. Patient has a known history of lung cancer is followed by Dr. Inda Merlin. She finished her last chemotherapy treatment on September 23. She's completed her radiation therapy. Cycle. CT chest on October 6, showed mild improvement in right upper lobe lung mass and subcarinal adenopathy with no evidence for disease progression. She did have a new small to moderate size pleural effusion.  Patient returns today reporting that she is feeling much improved. She has decreased shortness, of breath and cough. Patient denies any hemoptysis, orthopnea, PND, leg swelling, nausea, vomiting, choking, dysphagia. Patient had been recommended to start oxygen therapy. Due to exertional desaturations. At last visit.  However oxygen order was not completed. Today in the office. Patient's O2 saturations are 90-92% on room air at rest. However, walking, O2 saturation is 85% on room air. Patient has agreed to start on oxygen therapy at home.   ROV 04/09/13 -- COPD, NSCLCA dx RUL s/p chemo. Treated for an AE 1 month ago. Last visit she was improving, Spiriva was substituted for symbicort. She was treated again last week with doxy. Some throat irritation with  spiriva, but tolerating. Some improving cough, little mucous. Scheduled to see Dr Julien Nordmann w CT scan in January.   ROV 05/16/13 -- COPD, NSCLCA dx RUL s/p chemo. Last time we added back symbicort to her spiriva. She is now taking both and feels well. Still not 100% sure that she is better with the combination or just due to symbicort. No flares, no new issues.      09/04/13  Acute OV  Complains of  prod cough with increased SOB, wheezing, prod cough with clear-to-yellow mucus, chest tightness x5-6weeks.  Was treated with zpak and pred taper -got better for little while. Cough and wheezing is worse over last week.  Denies f/c/s, hemoptysis, nausea, vomiting Cough is keeping her up at night.  Has CT chest later this week .  >>Levaquin and steroids   08/01/2014 Acute OV :  COPD, NSCLCA dx RUL s/p chemo Patient presents for an acute office visit. She complains of 2 weeks of productive cough with thick mucus, wheezing, shortness of breath and general malaise. She denies any hemoptysis, orthopnea, PND or leg swelling Appetite is fair with no nausea, vomiting or diarrhea. She has been using Mucinex. She remains on Symbicort.  Patient has a known history of non-small cell lung cancer with previous chemotherapy. Unfortunately, recent CT chest. This month showed increased mass in the right upper lung and involvement of the right hilum along with increased lymphadenopathy. She has an upcoming PET scan and MRI for staging and consideration of restarting chemotherapy. She is followed by Dr. Earlie Server.    Review of Systems .Constitutional:   No  weight loss, night sweats,  Fevers, chills, + fatigue, or  lassitude.  HEENT:   No headaches,  Difficulty swallowing,  Tooth/dental problems, or  Sore throat,                No sneezing, itching, ear ache,  +nasal congestion, post nasal drip,   CV:  No chest pain,  Orthopnea, PND, swelling in lower extremities, anasarca, dizziness, palpitations, syncope.   GI   No heartburn, indigestion, abdominal pain, nausea, vomiting, diarrhea, change in bowel habits, loss of appetite, bloody stools.   Resp:    No chest wall deformity  Skin: no rash or lesions.  GU: no dysuria, change in color of urine, no urgency or frequency.  No flank pain, no hematuria   MS:  No joint pain or swelling.  No decreased range of motion.  No back pain.  Psych:  No change in mood or affect. No depression or anxiety.  No memory loss.         Objective:   Physical Exam GEN: A/Ox3; chronically ill appearing on O2   HEENT:  [place]/AT,  EACs-clear, TMs-wnl, NOSE-clear, THROAT-clear, no lesions, no postnasal drip or exudate noted.   NECK:  Supple w/ fair ROM; no JVD; normal carotid impulses w/o bruits; no thyromegaly or nodules palpated; no lymphadenopathy.  RESP  Few rhonchi and exp wheezes , no accessory muscle use, no dullness to percussion  CARD:  RRR, no m/r/g  , no peripheral edema, pulses intact, no cyanosis or clubbing.  GI:   Soft & nt; nml bowel sounds; no organomegaly or masses detected.  Musco: Warm bil, no deformities or joint swelling noted.   Neuro: alert, no focal deficits noted.    Skin: Warm, no lesions or rashes         Assessment & Plan:

## 2014-08-01 NOTE — Assessment & Plan Note (Signed)
Continue to follow with oncology as planned with upcoming PET and MRI for disease recurrence

## 2014-08-01 NOTE — Patient Instructions (Signed)
Avelox 400mg  daily for 7 days  Mucinex DM Twice daily  As needed  Cough/congestion  Prednisone taper over next week.  Fluids and rest  Tylenol As needed   Please contact office for sooner follow up if symptoms do not improve or worsen or seek emergency care  follow up Dr. Lamonte Sakai  In 6 weeks and As needed

## 2014-08-02 NOTE — Progress Notes (Signed)
Quick Note:  Called spoke with patient, advised of cxr results / recs as stated by TP. Pt verbalized her understanding and denied any questions. Pt will keep 3.7.16 PET appt and 3.8.16 appt with Dr Julien Nordmann. ______

## 2014-08-07 ENCOUNTER — Ambulatory Visit (HOSPITAL_COMMUNITY): Payer: BLUE CROSS/BLUE SHIELD

## 2014-08-12 ENCOUNTER — Ambulatory Visit (HOSPITAL_COMMUNITY)
Admission: RE | Admit: 2014-08-12 | Discharge: 2014-08-12 | Disposition: A | Discharge status: Home / Self Care | Payer: BLUE CROSS/BLUE SHIELD | Source: Ambulatory Visit | Attending: Internal Medicine | Admitting: Internal Medicine

## 2014-08-12 DIAGNOSIS — C3491 Malignant neoplasm of unspecified part of right bronchus or lung: Secondary | ICD-10-CM | POA: Diagnosis present

## 2014-08-12 DIAGNOSIS — Z87891 Personal history of nicotine dependence: Secondary | ICD-10-CM | POA: Insufficient documentation

## 2014-08-12 LAB — GLUCOSE, CAPILLARY: Glucose-Capillary: 99 mg/dL (ref 70–99)

## 2014-08-12 MED ORDER — GADOBENATE DIMEGLUMINE 529 MG/ML IV SOLN
15.0000 mL | Freq: Once | INTRAVENOUS | Status: AC | PRN
  Administered 2014-08-12: 12 mL via INTRAVENOUS

## 2014-08-12 MED ORDER — FLUDEOXYGLUCOSE F - 18 (FDG) INJECTION
6.7900 | Freq: Once | INTRAVENOUS | Status: AC | PRN
  Administered 2014-08-12: 6.79 via INTRAVENOUS

## 2014-08-13 ENCOUNTER — Ambulatory Visit (HOSPITAL_BASED_OUTPATIENT_CLINIC_OR_DEPARTMENT_OTHER): Payer: BLUE CROSS/BLUE SHIELD

## 2014-08-13 ENCOUNTER — Ambulatory Visit (HOSPITAL_BASED_OUTPATIENT_CLINIC_OR_DEPARTMENT_OTHER): Payer: BLUE CROSS/BLUE SHIELD | Admitting: Internal Medicine

## 2014-08-13 ENCOUNTER — Telehealth: Payer: Self-pay | Admitting: Internal Medicine

## 2014-08-13 ENCOUNTER — Encounter: Payer: Self-pay | Admitting: Internal Medicine

## 2014-08-13 ENCOUNTER — Telehealth: Payer: Self-pay | Admitting: Medical Oncology

## 2014-08-13 VITALS — BP 129/84 | HR 120 | Temp 98.9°F | Resp 18 | Ht 63.0 in | Wt 135.6 lb

## 2014-08-13 DIAGNOSIS — C3411 Malignant neoplasm of upper lobe, right bronchus or lung: Secondary | ICD-10-CM

## 2014-08-13 DIAGNOSIS — C3491 Malignant neoplasm of unspecified part of right bronchus or lung: Secondary | ICD-10-CM

## 2014-08-13 DIAGNOSIS — C3490 Malignant neoplasm of unspecified part of unspecified bronchus or lung: Secondary | ICD-10-CM

## 2014-08-13 DIAGNOSIS — C781 Secondary malignant neoplasm of mediastinum: Secondary | ICD-10-CM

## 2014-08-13 MED ORDER — HYDROCODONE-HOMATROPINE 5-1.5 MG/5ML PO SYRP: 5.0000 mL | ORAL_SOLUTION | Freq: Four times a day (QID) | ORAL | Status: DC | PRN

## 2014-08-13 MED ORDER — DOXYCYCLINE HYCLATE 100 MG PO TABS: 100.0000 mg | ORAL_TABLET | Freq: Two times a day (BID) | ORAL | Status: DC

## 2014-08-13 MED ORDER — PROCHLORPERAZINE MALEATE 10 MG PO TABS: 10.0000 mg | ORAL_TABLET | Freq: Four times a day (QID) | ORAL | Status: DC | PRN

## 2014-08-13 MED ORDER — DEXAMETHASONE 4 MG PO TABS: ORAL_TABLET | ORAL | Status: DC

## 2014-08-13 MED ORDER — FOLIC ACID 1 MG PO TABS: 1.0000 mg | ORAL_TABLET | Freq: Every day | ORAL | Status: AC

## 2014-08-13 MED ORDER — CYANOCOBALAMIN 1000 MCG/ML IJ SOLN
1000.0000 ug | Freq: Once | INTRAMUSCULAR | Status: AC
  Administered 2014-08-13: 1000 ug via INTRAMUSCULAR

## 2014-08-13 MED ORDER — OXYCODONE-ACETAMINOPHEN 5-325 MG PO TABS: 1.0000 | ORAL_TABLET | ORAL | Status: DC | PRN

## 2014-08-13 NOTE — Telephone Encounter (Signed)
can she start chemo prior to xrt. Note to mohamed.

## 2014-08-13 NOTE — Telephone Encounter (Signed)
gv and printed appt sched and avs for pt for March and April...sed added tx....Marland Kitchensent MSG to [facility] Can you please sched pt with Dr. Sondra Come

## 2014-08-13 NOTE — Progress Notes (Signed)
[place] [phone]   [phone]  OFFICE PROGRESS NOTE   DIAGNOSIS: Recurrent non-small cell lung cancer initially diagnosed as Stage IIIA non-small cell lung cancer, adenocarcinoma with negative EGFR mutation and negative ALK gene translocation diagnosed in March of 2014   PRIOR THERAPY:  1) Concurrent chemoradiation with weekly carboplatin for AUC of 2 and paclitaxel 45 mg/M2, status post 8 cycles, last dose was given on 11/13/2012 with partial response.  2) Consolidation chemotherapy with carboplatin for AUC of 5 on day 1 and gemcitabine 1000 mg/M2 on days 1 and 8 every 3 weeks, status post 3 cycles, last dose was given 02/20/2013 with mild improvement in her disease . First cycle was given on 01/02/2013.  3) status post subxiphoid pericardial window under the care of Dr. Roxan Hockey on 01/04/2014 and the final pathology showed no evidence for malignancy.  CURRENT THERAPY: Systemic chemotherapy with carboplatin for AUC of 5 and Alimta 500 MG/M2 every 3 weeks. First dose 08/20/2014  CHEMOTHERAPY INTENT: Control  CURRENT # OF CHEMOTHERAPY CYCLES: 1  CURRENT ANTIEMETICS: Zofran, dexamethasone and Compazine  CURRENT SMOKING STATUS: Former smoker, quit in [place]: None  CURRENT BISPHOSPHONATES USE: None  PAIN MANAGEMENT: 8/10, currently Percocet 5/325, 1-2 tabs q 4 hours  NARCOTICS INDUCED CONSTIPATION: None  LIVING WILL AND CODE STATUS: Full code  INTERVAL HISTORY: Linda Donovan 59 y.o. female returns to the clinic today for followup visit accompanied by her husband. The patient has been complaining of increasing shortness of breath in addition to cough with occasional hemoptysis. She was seen recently by Dr. Lamonte Sakai and started on treatment with Avelox for 7 days in addition to prednisone. She completed the treatment of Avelox few days ago and her symptoms got worse after stopping the antibiotics. She still on home  oxygen. She denied having any nausea or vomiting, no fever or chills. She has no weight loss or night sweats. Her recent CT scan of the chest showed evidence for disease progression. I ordered a PET scan and the patient is here today for evaluation and discussion of her scan results and recommendation regarding treatment of her condition.  MEDICAL HISTORY: Past Medical History  Diagnosis Date  . COPD (chronic obstructive pulmonary disease)   . ADD (attention deficit disorder)   . Hx of radiation therapy 09/28/12- 11/17/12    RU lung mass, mediastinum chest, 63 gray 35 fx  . Shortness of breath   . Asthma   . Pneumonia   . Arthritis   . Non-small cell lung cancer 09/18/2012    RUL  . Asthma     ALLERGIES:  is allergic to clinoril and morphine.  MEDICATIONS:  Current Outpatient Prescriptions  Medication Sig Dispense Refill  . albuterol (PROVENTIL HFA;VENTOLIN HFA) 108 (90 BASE) MCG/ACT inhaler Inhale 2 puffs into the lungs every 6 (six) hours as needed for wheezing. 8.5 g 5  . albuterol (PROVENTIL) (2.5 MG/3ML) 0.083% nebulizer solution Take 2.5 mg by nebulization every 6 (six) hours as needed for wheezing or shortness of breath.    . amphetamine-dextroamphetamine (ADDERALL) 20 MG tablet Take 20 mg by mouth daily before breakfast.     . aspirin 81 MG tablet Take 81 mg by mouth daily.    . budesonide-formoterol (SYMBICORT) 160-4.5 MCG/ACT inhaler Inhale 2 puffs into the lungs 2 (two) times daily. 1 Inhaler 0  . Calcium Carb-Ergocalciferol 250-125 MG-UNIT TABS Take 1 tablet by mouth daily.     Marland Kitchen  calcium-vitamin D (OSCAL WITH D) 250-125 MG-UNIT per tablet Take 1 tablet by mouth daily.    Marland Kitchen HYDROcodone-homatropine (HYCODAN) 5-1.5 MG/5ML syrup Take 5 mLs by mouth every 6 (six) hours as needed for cough. 120 mL 0  . magnesium oxide (MAG-OX) 400 MG tablet Take 1 tablet (400 mg total) by mouth daily. 60 tablet 2  . metoprolol succinate (TOPROL-XL) 25 MG 24 hr tablet Take 1 tablet (25 mg total) by  mouth daily. 60 tablet 5  . moxifloxacin (AVELOX) 400 MG tablet Take 1 tablet (400 mg total) by mouth daily. 7 tablet 0  . Multiple Vitamins-Minerals (MULTIVITAMIN WITH MINERALS) tablet Take 1 tablet by mouth daily.    Marland Kitchen oxyCODONE-acetaminophen (PERCOCET/ROXICET) 5-325 MG per tablet Take 1 tablet by mouth every 4 (four) hours as needed for severe pain. 30 tablet 0  . OXYGEN Inhale 2 L into the lungs continuous.    . Potassium 75 MG TABS Take 1 tablet by mouth daily.    . predniSONE (DELTASONE) 10 MG tablet 4 tabs for 2 days, then 3 tabs for 2 days, 2 tabs for 2 days, then 1 tab for 2 days, then stop 20 tablet 0  . traZODone (DESYREL) 50 MG tablet Take 50 mg by mouth at bedtime.    Marland Kitchen zinc gluconate 50 MG tablet Take 50 mg by mouth daily.     No current facility-administered medications for this visit.    SURGICAL HISTORY:  Past Surgical History  Procedure Laterality Date  . Total abdominal hysterectomy    . Shoulder arthroscopy    . Video bronchoscopy Bilateral 07/25/2012    Procedure: VIDEO BRONCHOSCOPY WITH FLUORO;  Surgeon: Tanda Rockers, MD;  Location: [facility];  Service: Endoscopy;  Laterality: Bilateral;  . Lump removed from breasr right  2003  . Endobronchial ultrasound Bilateral 09/04/2012    Procedure: ENDOBRONCHIAL ULTRASOUND;  Surgeon: Collene Gobble, MD;  Location: [facility];  Service: Cardiopulmonary;  Laterality: Bilateral;  . Breast surgery    . Subxyphoid pericardial window N/A 01/04/2014    Procedure: SUBXYPHOID PERICARDIAL WINDOW;  Surgeon: [name], MD;  Location: [facility];  Service: Thoracic;  Laterality: N/A;  . Intraoperative transesophageal echocardiogram N/A 01/04/2014    Procedure: INTRAOPERATIVE TRANSESOPHAGEAL ECHOCARDIOGRAM;  Surgeon: [name], MD;  Location: [facility];  Service: Open Heart Surgery;  Laterality: N/A;    REVIEW OF SYSTEMS:  Constitutional: positive for fatigue Eyes: negative Ears, nose, mouth, throat, and face:  negative Respiratory: positive for cough, dyspnea on exertion, hemoptysis and wheezing Cardiovascular: negative Gastrointestinal: negative Genitourinary:negative Integument/breast: negative Hematologic/lymphatic: negative Musculoskeletal:negative Neurological: negative Behavioral/Psych: negative Endocrine: negative Allergic/Immunologic: negative   PHYSICAL EXAMINATION: General appearance: alert, cooperative, fatigued and no distress Head: Normocephalic, without obvious abnormality, atraumatic Neck: no adenopathy, no JVD, supple, symmetrical, trachea midline and thyroid not enlarged, symmetric, no tenderness/mass/nodules Lymph nodes: Cervical, supraclavicular, and axillary nodes normal. Resp: clear to auscultation bilaterally and normal percussion bilaterally Back: symmetric, no curvature. ROM normal. No CVA tenderness. Cardio: regular rate and rhythm, S1, S2 normal, no murmur, click, rub or gallop and normal apical impulse GI: soft, non-tender; bowel sounds normal; no masses,  no organomegaly Extremities: extremities normal, atraumatic, no cyanosis or edema Neurologic: Alert and oriented X 3, normal strength and tone. Normal symmetric reflexes. Normal coordination and gait  ECOG PERFORMANCE STATUS: 1 - Symptomatic but completely ambulatory  There were no vitals taken for this visit.  LABORATORY DATA: Lab Results  Component Value Date   WBC 8.2 07/17/2014  HGB 13.4 07/17/2014   HCT 41.1 07/17/2014   MCV 101.2* 07/17/2014   PLT 326 07/17/2014      Chemistry      Component Value Date/Time   NA 140 07/17/2014 0901   NA 140 01/07/2014 0233   K 4.1 07/17/2014 0901   K 4.4 01/07/2014 0233   CL 100 01/07/2014 0233   CL 104 11/13/2012 1122   CO2 27 07/17/2014 0901   CO2 30 01/07/2014 0233   BUN 7.9 07/17/2014 0901   BUN 5* 01/07/2014 0233   CREATININE 0.7 07/17/2014 0901   CREATININE 0.54 01/07/2014 0233      Component Value Date/Time   CALCIUM 9.8 07/17/2014 0901    CALCIUM 8.9 01/07/2014 0233   ALKPHOS 129 07/17/2014 0901   ALKPHOS 83 01/06/2014 0311   AST 26 07/17/2014 0901   AST 20 01/06/2014 0311   ALT 21 07/17/2014 0901   ALT 15 01/06/2014 0311   BILITOT 0.57 07/17/2014 0901   BILITOT 0.3 01/06/2014 0311       RADIOGRAPHIC STUDIES: Dg Chest 2 View  08/02/2014   CLINICAL DATA:  Short of breath. History of lung cancer. COPD exacerbation  EXAM: CHEST  2 VIEW  COMPARISON:  CT head 07/17/2014, chest x-ray 01/21/2014  FINDINGS: Right upper lobe density has progressed since 01/21/2014 and may be similar to the recent CT scan. Some of this may be due to radiation therapy however recurrent tumor may be present based on the CT.  The lungs are hyperinflated consistent with COPD. Negative for heart failure or effusion.  IMPRESSION: Right upper lobe density. This is the location of previous tumor. The density may be related to recurrent tumor. There may be an element of infection or post treatment effect.  COPD.   Electronically Signed   By: Franchot Gallo M.D.   On: 08/02/2014 08:03   Ct Chest W Contrast  07/17/2014   CLINICAL DATA:  Followup right non-small cell lung carcinoma. Cough and shortness of breath. Please chemotherapy and radiation therapy.  EXAM: CT CHEST WITH CONTRAST  TECHNIQUE: Multidetector CT imaging of the chest was performed during intravenous contrast administration.  CONTRAST:  124m OMNIPAQUE IOHEXOL 300 MG/ML  SOLN  COMPARISON:  03/12/2014  FINDINGS: Mediastinum/Lymph Nodes: Mediastinal lymphadenopathy and subcarinal regions increased, currently measuring 2.4 cm on image 25 compared to 1.8 cm previously. A new 9 mm right supraclavicular lymph node is also seen on image 17/series 2.  Lungs/Pleura: Increased size of ill-defined masslike opacity is seen in the posterior right upper lung field which has involvement of the right hilum. This measures 5.5 x 4.9 cm on image 18/series 2 compared to 2.4 x 4.8 cm previously. Fluid is also seen within  several subpleural blebs in the posterior right lung apex which is new. Severe emphysema again demonstrated.  Musculoskeletal/Soft Tissues: No suspicious bone lesions or other significant chest wall abnormality.  Upper Abdomen:  Unremarkable.  IMPRESSION: Increased size of ill-defined mass in the posterior right upper lung field and involvement of the right hilum.  Increased subcarinal mediastinal lymphadenopathy and new mild right supraclavicular lymphadenopathy.  Severe emphysema, with new fluid filled blebs noted in the posterior right lung apex.   Electronically Signed   By: JEarle GellM.D.   On: 07/17/2014 11:13   Mr BJeri CosWXFContrast  08/12/2014   CLINICAL DATA:  Non-small-cell lung cancer. Rule out metastatic disease.  EXAM: MRI HEAD WITHOUT AND WITH CONTRAST  TECHNIQUE: Multiplanar, multiecho pulse sequences of the brain and  surrounding structures were obtained without and with intravenous contrast.  CONTRAST:  33m MULTIHANCE GADOBENATE DIMEGLUMINE 529 MG/ML IV SOLN  COMPARISON:  MRI 09/15/2012  FINDINGS: Ventricle size is normal.  Cerebral volume is normal.  Negative for acute or chronic infarction.  Negative for intracranial hemorrhage  Negative for mass or edema.  No shift of the midline structures  Postcontrast imaging reveals normal enhancement. No enhancing metastatic deposits are identified.  Calvarium is intact.  Paranasal sinuses clear.  IMPRESSION: Normal MRI of the brain with contrast. Negative for metastatic disease and unchanged from the prior study.   Electronically Signed   By: CFranchot GalloM.D.   On: 08/12/2014 16:02   Nm Pet Image Restag (ps) Skull Base To Thigh  08/12/2014   CLINICAL DATA:  Subsequent treatment strategy for Lung cancer.  EXAM: NUCLEAR MEDICINE PET SKULL BASE TO THIGH  TECHNIQUE: 6.8 mCi F-18 FDG was injected intravenously. Full-ring PET imaging was performed from the skull base to thigh after the radiotracer. CT data was obtained and used for attenuation  correction and anatomic localization.  FASTING BLOOD GLUCOSE:  Value: 99 mg/dl  COMPARISON:  08/04/2012  FINDINGS: NECK  Enlarged right supraclavicular lymph node measures 1.3 cm and has an SUV max equal to 2.9.  CHEST  Enlarged anterior mediastinal lymph node measures 1.1 cm and has an SUV max equal to 1.7.  Advanced changes of centrilobular and paraseptal emphysema identified. Mass within the posterior and medial right lung is intensely hypermetabolic. This measures 5.7 x 3.3 cm and has an SUV max equal to 6.6 and is compatible with recurrent hypermetabolic tumor. Tumor encasing the right hilar region and extending into the posterior mediastinum measures 5.4 x 4.6 cm and has an SUV max equal to 7.7. There is loculated fluid which extends over the posterior right lung apex. Increased uptake along the posterior medial right lung apex is identified likely indicating pleural involvement by tumor.  ABDOMEN/PELVIS  No abnormal hypermetabolic activity within the liver, pancreas, adrenal glands, or spleen. Gallstones noted. No hypermetabolic lymph nodes in the abdomen or pelvis.  SKELETON  No focal hypermetabolic activity to suggest skeletal metastasis.  IMPRESSION: 1. Examination is positive for recurrent tumor within the right hemi thorax. There is a large mass within the right hilar region which extends into the posterior mediastinum. Within the posterior and medial right lower lobe there is hypermetabolic tumor. 2. Hypermetabolic mediastinal and right supraclavicular lymph node metastasis.   Electronically Signed   By: TKerby MoorsM.D.   On: 08/12/2014 15:26    ASSESSMENT AND PLAN: This is a very pleasant 59years old white female with stage IIIA non-small cell lung cancer, adenocarcinoma with negative EGFR mutation and negative ALK gene translocation is status post concurrent chemoradiation with weekly carboplatin and paclitaxel followed by consolidation chemotherapy with 3 cycles of carboplatin and  gemcitabine. The recent CT scan of the chest showed increase in the size of the right upper lobe lung mass as well as the subcarinal lymphadenopathy. A PET scan performed yesterday showed signs for recurrent tumor in the right hemithorax with a large mass within the right hilar region extending into the posterior mediastinum with hypermetabolic mediastinal and right supraclavicular lymph node metastasis. I discussed the scan results and showed the images to the patient and her husband.  I had a lengthy discussion with the patient and her husband about her treatment options. I discussed with the patient's systemic chemotherapy with carboplatin for AUC of 5 and Alimta 500 MG/M2  every 3 weeks. The patient is interested in proceeding with chemotherapy and she is expected to start the first cycle of this treatment next week. I will arrange for the patient to receive vitamin B 12 injection today and I will call her pharmacy with prescription to Compazine 10 mg by mouth every 6 hours as needed for nausea, Decadron 4 mg by mouth twice a day the day before, day of and day after the chemotherapy in addition to folic acid 1 mg by mouth daily. For the persistent shortness of breath, I started the patient on a course of doxycycline 100 mg by mouth twice a day. I also referred the patient to Dr. Sondra Come for consideration of short course of palliative radiotherapy to the enlarging right hilar mass. She will come back for follow-up visit in 4 weeks with the start of cycle #2 of her chemotherapy.  The patient was advised to call immediately if she has any concerning symptoms in the interval.  The patient voices understanding of current disease status and treatment options and is in agreement with the current care plan.  All questions were answered. The patient knows to call the clinic with any problems, questions or concerns. We can certainly see the patient much sooner if necessary.  Disclaimer: This note was dictated  with voice recognition software. Similar sounding words can inadvertently be transcribed and may not be corrected upon review.

## 2014-08-14 ENCOUNTER — Telehealth: Payer: Self-pay | Admitting: *Deleted

## 2014-08-14 NOTE — Telephone Encounter (Signed)
-----   Message from Ardeen Garland, RN sent at 08/13/2014  4:20 PM EST ----- XRT first available march 23rd. Is it okay to start chemo before xrt?   Stanton Kidney ,please call  back Jaylynn with answer

## 2014-08-14 NOTE — Telephone Encounter (Signed)
Per MD ok for pt to start chemo prior to radiation treatments, called and notified pt. No concerns at this time.

## 2014-08-19 ENCOUNTER — Telehealth: Payer: Self-pay | Admitting: Medical Oncology

## 2014-08-19 ENCOUNTER — Telehealth: Payer: Self-pay | Admitting: Oncology

## 2014-08-19 NOTE — Telephone Encounter (Signed)
-----   Message from Curt Bears, MD sent at 08/14/2014  8:14 AM EST ----- Yes ----- Message -----    From: Ardeen Garland, RN    Sent: 08/13/2014   4:20 PM      To: Lucile Crater, RN, Curt Bears, MD  XRT first available march 23rd. Is it okay to start chemo before xrt?   Stanton Kidney ,please call  back Tyashia with answer

## 2014-08-19 NOTE — Telephone Encounter (Signed)
Linda Donovan called and said that she would be coming back to see Dr. Sondra Come next week.  She said she is having more trouble breathing.  She reports she is using 2 l of oxygen and says her oxygen saturation is ranging from 91% - 83% - 95%.  Advised her that I would contact Dr. Worthy Flank office to let him know about her breathing issues.  Called Abelina Bachelor, RN.  Addilynne said she will call Ms Boling.

## 2014-08-19 NOTE — Telephone Encounter (Signed)
Done by Stanton Kidney garner

## 2014-08-19 NOTE — Telephone Encounter (Signed)
I called pt and she sounded dyspneic. She only took 1 nebulizer treatment today so I encouraged her to take it every 5-6 hours and if her symptoms persists she needs to go to ED . She said she will .

## 2014-08-21 NOTE — Progress Notes (Signed)
Thoracic Location of Tumor / Histology: Recurrent non-small cell lung cancer -enlarging right hilar mass   Patient presented with symptoms of: increasing shortness of breath in addition to cough with occasional hemoptysis  CT of chest from 08/02/14 shows "Right upper lobe density. This is the location of previous tumor.  The density may be related to recurrent tumor. There may be an element of infection or post treatment effect."  PET scan from 08/12/14 shows "1. Examination is positive for recurrent tumor within the right hemi thorax. There is a large mass within the right hilar region which extends into the posterior mediastinum. Within the posterior and medial right lower lobe there is hypermetabolic tumor. 2. Hypermetabolic mediastinal and right supraclavicular lymph node metastasis."  MR of brain from 08/12/14 is normal.  Tobacco/Marijuana/Snuff/ETOH use: former smoker. Smoked 1 ppd for 20 years. Quit 18 years ago.  Has 1 glass of wine per week.  Past/Anticipated interventions by cardiothoracic surgery, if any: no  Past/Anticipated interventions by medical oncology, if any: 1) Concurrent chemoradiation with weekly carboplatin for AUC of 2 and paclitaxel 45 mg/M2, status post 8 cycles, last dose was given on 11/13/2012 with partial response.  2) Consolidation chemotherapy with carboplatin for AUC of 5 on day 1 and gemcitabine 1000 mg/M2 on days 1 and 8 every 3 weeks, status post 3 cycles, last dose was given 02/20/2013 with mild improvement in her disease . First cycle was given on 01/02/2013. There are now plans for systemic chemotherapy with carboplatin for AUC of 5 and Alimta 500 MG/M2 every 3 weeks.  Had chemotherapy on Friday, 3/18 and felt better for a couple of days.  Signs/Symptoms  Weight changes, if any: yes - feels like food gets stuck in her chest  Respiratory complaints, if any: yes - frequent cough with green sputum - takes hycodan with some relief - has 2 L of  oxygen  Hemoptysis, if any: no  Pain issues, if any:  Yes - pain in both sides of ribs, right worse than left - usually takes 1-2 tablets of percocet per day.  SAFETY ISSUES:  Prior radiation? 09/28/12- 11/17/12 RU lung mass, mediastinum chest, 63 gray 35 fx  Pacemaker/ICD? no   Possible current pregnancy?no  Is the patient on methotrexate? no  Current Complaints / other details:  Patient is here with her husband.  She is coughing frequently and is leaning forward to help her breathe.  She reports she felt better after chemo on Friday and was even able to walk up stairs.  She started feeling bad again Sunday night.  BP 128/76 mmHg  Pulse 129  Temp(Src) 99 F (37.2 C) (Oral)  Resp 32  Ht 5\' 3"  (1.6 m)  Wt 130 lb 8 oz (59.194 kg)  BMI 23.12 kg/m2  SpO2 92%

## 2014-08-23 ENCOUNTER — Ambulatory Visit (HOSPITAL_BASED_OUTPATIENT_CLINIC_OR_DEPARTMENT_OTHER): Payer: BLUE CROSS/BLUE SHIELD

## 2014-08-23 ENCOUNTER — Other Ambulatory Visit (HOSPITAL_BASED_OUTPATIENT_CLINIC_OR_DEPARTMENT_OTHER): Payer: BLUE CROSS/BLUE SHIELD

## 2014-08-23 ENCOUNTER — Other Ambulatory Visit: Payer: Self-pay | Admitting: Internal Medicine

## 2014-08-23 DIAGNOSIS — C3411 Malignant neoplasm of upper lobe, right bronchus or lung: Secondary | ICD-10-CM

## 2014-08-23 DIAGNOSIS — C3491 Malignant neoplasm of unspecified part of right bronchus or lung: Secondary | ICD-10-CM

## 2014-08-23 DIAGNOSIS — C3490 Malignant neoplasm of unspecified part of unspecified bronchus or lung: Secondary | ICD-10-CM

## 2014-08-23 DIAGNOSIS — Z5111 Encounter for antineoplastic chemotherapy: Secondary | ICD-10-CM

## 2014-08-23 LAB — COMPREHENSIVE METABOLIC PANEL (CC13)
ALK PHOS: 102 U/L (ref 40–150)
ALT: 22 U/L (ref 0–55)
AST: 20 U/L (ref 5–34)
Albumin: 3.5 g/dL (ref 3.5–5.0)
Anion Gap: 11 mEq/L (ref 3–11)
BILIRUBIN TOTAL: 0.36 mg/dL (ref 0.20–1.20)
BUN: 9.9 mg/dL (ref 7.0–26.0)
CO2: 25 mEq/L (ref 22–29)
Calcium: 10 mg/dL (ref 8.4–10.4)
Chloride: 101 mEq/L (ref 98–109)
Creatinine: 0.6 mg/dL (ref 0.6–1.1)
Glucose: 117 mg/dl (ref 70–140)
Potassium: 4 mEq/L (ref 3.5–5.1)
SODIUM: 137 meq/L (ref 136–145)
Total Protein: 7.5 g/dL (ref 6.4–8.3)

## 2014-08-23 LAB — CBC WITH DIFFERENTIAL/PLATELET
BASO%: 0.2 % (ref 0.0–2.0)
Basophils Absolute: 0 10*3/uL (ref 0.0–0.1)
EOS%: 0.6 % (ref 0.0–7.0)
Eosinophils Absolute: 0.1 10*3/uL (ref 0.0–0.5)
HCT: 43.1 % (ref 34.8–46.6)
HGB: 13.9 g/dL (ref 11.6–15.9)
LYMPH%: 4.4 % — ABNORMAL LOW (ref 14.0–49.7)
MCH: 32.5 pg (ref 25.1–34.0)
MCHC: 32.4 g/dL (ref 31.5–36.0)
MCV: 100.5 fL (ref 79.5–101.0)
MONO#: 1 10*3/uL — ABNORMAL HIGH (ref 0.1–0.9)
MONO%: 6.6 % (ref 0.0–14.0)
NEUT#: 13.5 10*3/uL — ABNORMAL HIGH (ref 1.5–6.5)
NEUT%: 88.2 % — ABNORMAL HIGH (ref 38.4–76.8)
Platelets: 392 10*3/uL (ref 145–400)
RBC: 4.29 10*6/uL (ref 3.70–5.45)
RDW: 13 % (ref 11.2–14.5)
WBC: 15.3 10*3/uL — ABNORMAL HIGH (ref 3.9–10.3)
lymph#: 0.7 10*3/uL — ABNORMAL LOW (ref 0.9–3.3)

## 2014-08-23 MED ORDER — SODIUM CHLORIDE 0.9 % IV SOLN
Freq: Once | INTRAVENOUS | Status: AC
  Administered 2014-08-23: 13:00:00 via INTRAVENOUS
  Filled 2014-08-23: qty 8

## 2014-08-23 MED ORDER — CARBOPLATIN CHEMO INTRADERMAL TEST DOSE 100MCG/0.02ML
100.0000 ug | Freq: Once | INTRADERMAL | Status: AC
  Administered 2014-08-23: 100 ug via INTRADERMAL
  Filled 2014-08-23: qty 0.01

## 2014-08-23 MED ORDER — SODIUM CHLORIDE 0.9 % IV SOLN
488.0000 mg | Freq: Once | INTRAVENOUS | Status: AC
  Administered 2014-08-23: 490 mg via INTRAVENOUS
  Filled 2014-08-23: qty 49

## 2014-08-23 MED ORDER — SODIUM CHLORIDE 0.9 % IV SOLN
490.0000 mg/m2 | Freq: Once | INTRAVENOUS | Status: AC
  Administered 2014-08-23: 800 mg via INTRAVENOUS
  Filled 2014-08-23: qty 32

## 2014-08-23 MED ORDER — SODIUM CHLORIDE 0.9 % IV SOLN
Freq: Once | INTRAVENOUS | Status: AC
  Administered 2014-08-23: 12:00:00 via INTRAVENOUS

## 2014-08-23 NOTE — Progress Notes (Signed)
Per Dr Julien Nordmann it is okay to treat pt today with chemo and heart rate 115.

## 2014-08-23 NOTE — Patient Instructions (Addendum)
[facility] Discharge Instructions for Patients Receiving Chemotherapy  Today you received the following chemotherapy agents: Alimta and Carboplatin.   To help prevent nausea and vomiting after your treatment, we encourage you to take your nausea medication as directed.    If you develop nausea and vomiting that is not controlled by your nausea medication, call the clinic.   BELOW ARE SYMPTOMS THAT SHOULD BE REPORTED IMMEDIATELY:  *FEVER GREATER THAN 100.5 F  *CHILLS WITH OR WITHOUT FEVER  NAUSEA AND VOMITING THAT IS NOT CONTROLLED WITH YOUR NAUSEA MEDICATION  *UNUSUAL SHORTNESS OF BREATH  *UNUSUAL BRUISING OR BLEEDING  TENDERNESS IN MOUTH AND THROAT WITH OR WITHOUT PRESENCE OF ULCERS  *URINARY PROBLEMS  *BOWEL PROBLEMS  UNUSUAL RASH Items with * indicate a potential emergency and should be followed up as soon as possible.  Feel free to call the clinic you have any questions or concerns. The clinic phone number is [phone].  Please show the [facility] at check-in to the Emergency Department and triage nurse.  Pemetrexed injection What is this medicine? PEMETREXED (PEM e TREX ed) is a chemotherapy drug. This medicine affects cells that are rapidly growing, such as cancer cells and cells in your mouth and stomach. It is usually used to treat lung cancers like non-small cell lung cancer and mesothelioma. It may also be used to treat other cancers. This medicine may be used for other purposes; ask your health care provider or pharmacist if you have questions. COMMON BRAND NAME(S): Alimta What should I tell my health care provider before I take this medicine? They need to know if you have any of these conditions: -if you frequently drink alcohol containing beverages -infection (especially a virus infection such as chickenpox, cold sores, or herpes) -kidney disease -liver disease -low blood counts, like low platelets, red bloods, or white blood  cells -an unusual or allergic reaction to pemetrexed, mannitol, other medicines, foods, dyes, or preservatives -pregnant or trying to get pregnant -breast-feeding How should I use this medicine? This drug is given as an infusion into a vein. It is administered in a hospital or clinic by a specially trained health care professional. Talk to your pediatrician regarding the use of this medicine in children. Special care may be needed. Overdosage: If you think you have taken too much of this medicine contact a poison control center or emergency room at once. NOTE: This medicine is only for you. Do not share this medicine with others. What if I miss a dose? It is important not to miss your dose. Call your doctor or health care professional if you are unable to keep an appointment. What may interact with this medicine? -aspirin and aspirin-like medicines -medicines to increase blood counts like filgrastim, pegfilgrastim, sargramostim -methotrexate -NSAIDS, medicines for pain and inflammation, like ibuprofen or naproxen -probenecid -pyrimethamine -vaccines Talk to your doctor or health care professional before taking any of these medicines: -acetaminophen -aspirin -ibuprofen -ketoprofen -naproxen This list may not describe all possible interactions. Give your health care provider a list of all the medicines, herbs, non-prescription drugs, or dietary supplements you use. Also tell them if you smoke, drink alcohol, or use illegal drugs. Some items may interact with your medicine. What should I watch for while using this medicine? Visit your doctor for checks on your progress. This drug may make you feel generally unwell. This is not uncommon, as chemotherapy can affect healthy cells as well as cancer cells. Report any side effects. Continue your course  of treatment even though you feel ill unless your doctor tells you to stop. In some cases, you may be given additional medicines to help with side  effects. Follow all directions for their use. Call your doctor or health care professional for advice if you get a fever, chills or sore throat, or other symptoms of a cold or flu. Do not treat yourself. This drug decreases your body's ability to fight infections. Try to avoid being around people who are sick. This medicine may increase your risk to bruise or bleed. Call your doctor or health care professional if you notice any unusual bleeding. Be careful brushing and flossing your teeth or using a toothpick because you may get an infection or bleed more easily. If you have any dental work done, tell your dentist you are receiving this medicine. Avoid taking products that contain aspirin, acetaminophen, ibuprofen, naproxen, or ketoprofen unless instructed by your doctor. These medicines may hide a fever. Call your doctor or health care professional if you get diarrhea or mouth sores. Do not treat yourself. To protect your kidneys, drink water or other fluids as directed while you are taking this medicine. Men and women must use effective birth control while taking this medicine. You may also need to continue using effective birth control for a time after stopping this medicine. Do not become pregnant while taking this medicine. Tell your doctor right away if you think that you or your partner might be pregnant. There is a potential for serious side effects to an unborn child. Talk to your health care professional or pharmacist for more information. Do not breast-feed an infant while taking this medicine. This medicine may lower sperm counts. What side effects may I notice from receiving this medicine? Side effects that you should report to your doctor or health care professional as soon as possible: -allergic reactions like skin rash, itching or hives, swelling of the face, lips, or tongue -low blood counts - this medicine may decrease the number of white blood cells, red blood cells and platelets. You  may be at increased risk for infections and bleeding. -signs of infection - fever or chills, cough, sore throat, pain or difficulty passing urine -signs of decreased platelets or bleeding - bruising, pinpoint red spots on the skin, black, tarry stools, blood in the urine -signs of decreased red blood cells - unusually weak or tired, fainting spells, lightheadedness -breathing problems, like a dry cough -changes in emotions or moods -chest pain -confusion -diarrhea -high blood pressure -mouth or throat sores or ulcers -pain, swelling, warmth in the leg -pain on swallowing -swelling of the ankles, feet, hands -trouble passing urine or change in the amount of urine -vomiting -yellowing of the eyes or skin Side effects that usually do not require medical attention (report to your doctor or health care professional if they continue or are bothersome): -hair loss -loss of appetite -nausea -stomach upset This list may not describe all possible side effects. Call your doctor for medical advice about side effects. You may report side effects to FDA at 1-800-FDA-1088. Where should I keep my medicine? This drug is given in a hospital or clinic and will not be stored at home. NOTE: This sheet is a summary. It may not cover all possible information. If you have questions about this medicine, talk to your doctor, pharmacist, or health care provider.  2015, [employer]/Gold Standard. (2007-12-26 13:24:03)

## 2014-08-26 ENCOUNTER — Telehealth: Payer: Self-pay | Admitting: Medical Oncology

## 2014-08-26 DIAGNOSIS — C3491 Malignant neoplasm of unspecified part of right bronchus or lung: Secondary | ICD-10-CM

## 2014-08-26 DIAGNOSIS — C3490 Malignant neoplasm of unspecified part of unspecified bronchus or lung: Secondary | ICD-10-CM

## 2014-08-26 MED ORDER — HYDROCODONE-HOMATROPINE 5-1.5 MG/5ML PO SYRP: 5.0000 mL | ORAL_SOLUTION | Freq: Four times a day (QID) | ORAL | Status: DC | PRN

## 2014-08-26 NOTE — Telephone Encounter (Signed)
Doing well, no nausea,vomiting. Breathing was better for two days after chemo ( received steroids) and today seems to be going back to baseline SOB.Rx for hycodan locked in injection room.

## 2014-08-26 NOTE — Telephone Encounter (Signed)
Per [name] In instructed pt to f/u with Dr Lamonte Sakai or go to [facility].

## 2014-08-26 NOTE — Telephone Encounter (Signed)
-----   Message from Oliver Hum, RN sent at 08/23/2014  2:36 PM EDT ----- Regarding: Chemo Follow up Call First time Alimta. Pt has had Carboplatin previously. Dr. Julien Nordmann.

## 2014-08-28 ENCOUNTER — Encounter: Payer: Self-pay | Admitting: Radiation Oncology

## 2014-08-28 ENCOUNTER — Ambulatory Visit
Admission: RE | Admit: 2014-08-28 | Discharge: 2014-08-28 | Disposition: A | Discharge status: Home / Self Care | Payer: BLUE CROSS/BLUE SHIELD | Source: Ambulatory Visit | Attending: Radiation Oncology | Admitting: Radiation Oncology

## 2014-08-28 VITALS — BP 128/76 | HR 129 | Temp 99.0°F | Resp 32 | Ht 63.0 in | Wt 130.5 lb

## 2014-08-28 DIAGNOSIS — C3491 Malignant neoplasm of unspecified part of right bronchus or lung: Secondary | ICD-10-CM

## 2014-08-28 DIAGNOSIS — Z87891 Personal history of nicotine dependence: Secondary | ICD-10-CM | POA: Insufficient documentation

## 2014-08-28 MED ORDER — HYDROCOD POLST-CHLORPHEN POLST 10-8 MG/5ML PO LQCR: 5.0000 mL | Freq: Two times a day (BID) | ORAL | Status: DC | PRN

## 2014-08-28 NOTE — Progress Notes (Signed)
Radiation Oncology         [phone]) [phone] ________________________________  Name: Linda Donovan MRN: [id number]  Date: 08/28/2014  DOB: Oct 31, 1955  Re-evaluation Note  CC: Pcp Not In System  Curt Bears, MD    ICD-9-CM ICD-10-CM   1. Non-small cell lung cancer, right 162.9 C34.91 Ambulatory referral to Social Work    Diagnosis:  Stage IIIa (T3, N2, M0) non-small cell lung cancer, now with recurrence   Interval Since Last Radiation:  One year and 9 months, the patient completed 63 gray in 35 fractions directed at the right upper lung mass and mediastinal region  Narrative:  The patient returns today at the courtesy of Dr. Julien Nordmann for consideration for additional radiation therapy as part of management of the patient's progressive non-small cell lung cancer. Recently the patient has been noted to have increasing dyspnea, some chest tightness, mild hemoptysis and excessive coughing. Imaging shows progression in the right chest region as documented below. In light of the patient's breathing issues significant coughing and mild hemoptysis she is now seen in radiation oncology for consideration for a short course of palliative radiation therapy directed at the chest area.                            ALLERGIES:  is allergic to clinoril and morphine.  Meds: Current Outpatient Prescriptions  Medication Sig Dispense Refill  . albuterol (PROVENTIL HFA;VENTOLIN HFA) 108 (90 BASE) MCG/ACT inhaler Inhale 2 puffs into the lungs every 6 (six) hours as needed for wheezing. 8.5 g 5  . albuterol (PROVENTIL) (2.5 MG/3ML) 0.083% nebulizer solution Take 2.5 mg by nebulization every 6 (six) hours as needed for wheezing or shortness of breath.    . amphetamine-dextroamphetamine (ADDERALL) 20 MG tablet Take 20 mg by mouth daily before breakfast.     . aspirin 81 MG tablet Take 81 mg by mouth daily.    . budesonide-formoterol (SYMBICORT) 160-4.5 MCG/ACT inhaler Inhale 2 puffs into the lungs 2 (two) times  daily. 1 Inhaler 0  . Calcium Carb-Ergocalciferol 250-125 MG-UNIT TABS Take 1 tablet by mouth daily.     . calcium-vitamin D (OSCAL WITH D) 250-125 MG-UNIT per tablet Take 1 tablet by mouth daily.    Marland Kitchen dexamethasone (DECADRON) 4 MG tablet 4 mg by mouth twice a day the day before, day of and day after the chemotherapy every 3 weeks. 40 tablet 1  . folic acid (FOLVITE) 1 MG tablet Take 1 tablet (1 mg total) by mouth daily. 30 tablet 4  . HYDROcodone-homatropine (HYCODAN) 5-1.5 MG/5ML syrup Take 5 mLs by mouth every 6 (six) hours as needed for cough. 120 mL 0  . magnesium oxide (MAG-OX) 400 MG tablet Take 1 tablet (400 mg total) by mouth daily. 60 tablet 2  . metoprolol succinate (TOPROL-XL) 25 MG 24 hr tablet Take 1 tablet (25 mg total) by mouth daily. 60 tablet 5  . Multiple Vitamins-Minerals (MULTIVITAMIN WITH MINERALS) tablet Take 1 tablet by mouth daily.    Marland Kitchen oxyCODONE-acetaminophen (PERCOCET/ROXICET) 5-325 MG per tablet Take 1 tablet by mouth every 4 (four) hours as needed for severe pain. 30 tablet 0  . OXYGEN Inhale 2 L into the lungs continuous.    . Potassium 75 MG TABS Take 1 tablet by mouth daily.    . prochlorperazine (COMPAZINE) 10 MG tablet Take 1 tablet (10 mg total) by mouth every 6 (six) hours as needed for nausea or vomiting. 30 tablet 0  .  traZODone (DESYREL) 50 MG tablet Take 50 mg by mouth at bedtime.    Marland Kitchen zinc gluconate 50 MG tablet Take 50 mg by mouth daily.    . chlorpheniramine-HYDROcodone (TUSSIONEX) 10-8 MG/5ML LQCR Take 5 mLs by mouth every 12 (twelve) hours as needed for cough. 115 mL 0  . doxycycline (VIBRA-TABS) 100 MG tablet Take 1 tablet (100 mg total) by mouth 2 (two) times daily. (Patient not taking: Reported on 08/28/2014) 20 tablet 0   No current facility-administered medications for this encounter.    Physical Findings: The patient is in no acute distress. Patient is alert and oriented.  Accompanied by husband on evaluation today  height is 5\' 3"  (1.6 m) and  weight is 130 lb 8 oz (59.194 kg). Her oral temperature is 99 F (37.2 C). Her blood pressure is 128/76 and her pulse is 129. Her respiration is 32 and oxygen saturation is 92%. . Supplemental oxygen in place by nasal cannula. The patient coughs significantly throughout the evaluation today. She appears tired. The lung exam reveals some wheezing throughout with upper respiratory congestion. The heart has a regular rhythm with increased rate.  Lab Findings: Lab Results  Component Value Date   WBC 15.3* 08/23/2014   HGB 13.9 08/23/2014   HCT 43.1 08/23/2014   MCV 100.5 08/23/2014   PLT 392 08/23/2014    Radiographic Findings: Dg Chest 2 View  08/02/2014   CLINICAL DATA:  Short of breath. History of lung cancer. COPD exacerbation  EXAM: CHEST  2 VIEW  COMPARISON:  CT head 07/17/2014, chest x-ray 01/21/2014  FINDINGS: Right upper lobe density has progressed since 01/21/2014 and may be similar to the recent CT scan. Some of this may be due to radiation therapy however recurrent tumor may be present based on the CT.  The lungs are hyperinflated consistent with COPD. Negative for heart failure or effusion.  IMPRESSION: Right upper lobe density. This is the location of previous tumor. The density may be related to recurrent tumor. There may be an element of infection or post treatment effect.  COPD.   Electronically Signed   By: Franchot Gallo M.D.   On: 08/02/2014 08:03   Mr Jeri Cos VO Contrast  08/12/2014   CLINICAL DATA:  Non-small-cell lung cancer. Rule out metastatic disease.  EXAM: MRI HEAD WITHOUT AND WITH CONTRAST  TECHNIQUE: Multiplanar, multiecho pulse sequences of the brain and surrounding structures were obtained without and with intravenous contrast.  CONTRAST:  5mL MULTIHANCE GADOBENATE DIMEGLUMINE 529 MG/ML IV SOLN  COMPARISON:  MRI 09/15/2012  FINDINGS: Ventricle size is normal.  Cerebral volume is normal.  Negative for acute or chronic infarction.  Negative for intracranial hemorrhage   Negative for mass or edema.  No shift of the midline structures  Postcontrast imaging reveals normal enhancement. No enhancing metastatic deposits are identified.  Calvarium is intact.  Paranasal sinuses clear.  IMPRESSION: Normal MRI of the brain with contrast. Negative for metastatic disease and unchanged from the prior study.   Electronically Signed   By: Franchot Gallo M.D.   On: 08/12/2014 16:02   Nm Pet Image Restag (ps) Skull Base To Thigh  08/12/2014   CLINICAL DATA:  Subsequent treatment strategy for Lung cancer.  EXAM: NUCLEAR MEDICINE PET SKULL BASE TO THIGH  TECHNIQUE: 6.8 mCi F-18 FDG was injected intravenously. Full-ring PET imaging was performed from the skull base to thigh after the radiotracer. CT data was obtained and used for attenuation correction and anatomic localization.  FASTING BLOOD GLUCOSE:  Value: 99 mg/dl  COMPARISON:  08/04/2012  FINDINGS: NECK  Enlarged right supraclavicular lymph node measures 1.3 cm and has an SUV max equal to 2.9.  CHEST  Enlarged anterior mediastinal lymph node measures 1.1 cm and has an SUV max equal to 1.7.  Advanced changes of centrilobular and paraseptal emphysema identified. Mass within the posterior and medial right lung is intensely hypermetabolic. This measures 5.7 x 3.3 cm and has an SUV max equal to 6.6 and is compatible with recurrent hypermetabolic tumor. Tumor encasing the right hilar region and extending into the posterior mediastinum measures 5.4 x 4.6 cm and has an SUV max equal to 7.7. There is loculated fluid which extends over the posterior right lung apex. Increased uptake along the posterior medial right lung apex is identified likely indicating pleural involvement by tumor.  ABDOMEN/PELVIS  No abnormal hypermetabolic activity within the liver, pancreas, adrenal glands, or spleen. Gallstones noted. No hypermetabolic lymph nodes in the abdomen or pelvis.  SKELETON  No focal hypermetabolic activity to suggest skeletal metastasis.  IMPRESSION:  1. Examination is positive for recurrent tumor within the right hemi thorax. There is a large mass within the right hilar region which extends into the posterior mediastinum. Within the posterior and medial right lower lobe there is hypermetabolic tumor. 2. Hypermetabolic mediastinal and right supraclavicular lymph node metastasis.   Electronically Signed   By: Kerby Moors M.D.   On: 08/12/2014 15:26    Impression:  Recurrent non-small cell lung cancer. The patient has a significant recurrence along the right central chest. The bronchial airways are narrrowed in light of this recurrence. This is likely causing some of her issues with coughing and hemoptysis. The patient has received previous full dose radiation therapy to this area. Given the patient's symptoms and minimal options at this point, I feel she would be a candidate for a short course of palliative radiation therapy. Patient she is at increased risk for complications given her previous radiation therapy to this area. Given her symptoms she is willing to accept this increased risk.  Plan:  Simulation and planning March 24 with treatments to begin early next week. Anticipate approximately 10-15 radiation treatments.  ____________________________________ Blair Promise, MD

## 2014-08-28 NOTE — Progress Notes (Signed)
Please see the Nurse Progress Note in the MD Initial Consult Encounter for this patient. 

## 2014-08-29 ENCOUNTER — Ambulatory Visit
Admission: RE | Admit: 2014-08-29 | Discharge: 2014-08-29 | Disposition: A | Discharge status: Home / Self Care | Payer: BLUE CROSS/BLUE SHIELD | Source: Ambulatory Visit | Attending: Radiation Oncology | Admitting: Radiation Oncology

## 2014-08-29 DIAGNOSIS — C3491 Malignant neoplasm of unspecified part of right bronchus or lung: Secondary | ICD-10-CM

## 2014-08-30 ENCOUNTER — Other Ambulatory Visit (HOSPITAL_BASED_OUTPATIENT_CLINIC_OR_DEPARTMENT_OTHER): Payer: BLUE CROSS/BLUE SHIELD

## 2014-08-30 DIAGNOSIS — C3491 Malignant neoplasm of unspecified part of right bronchus or lung: Secondary | ICD-10-CM

## 2014-08-30 DIAGNOSIS — C3490 Malignant neoplasm of unspecified part of unspecified bronchus or lung: Secondary | ICD-10-CM

## 2014-08-30 LAB — CBC WITH DIFFERENTIAL/PLATELET
BASO%: 0.2 % (ref 0.0–2.0)
Basophils Absolute: 0 10*3/uL (ref 0.0–0.1)
EOS%: 4.7 % (ref 0.0–7.0)
Eosinophils Absolute: 0.4 10*3/uL (ref 0.0–0.5)
HCT: 40.5 % (ref 34.8–46.6)
HGB: 13.6 g/dL (ref 11.6–15.9)
LYMPH%: 13.3 % — AB (ref 14.0–49.7)
MCH: 33.6 pg (ref 25.1–34.0)
MCHC: 33.6 g/dL (ref 31.5–36.0)
MCV: 100 fL (ref 79.5–101.0)
MONO#: 0.6 10*3/uL (ref 0.1–0.9)
MONO%: 7.5 % (ref 0.0–14.0)
NEUT%: 74.3 % (ref 38.4–76.8)
NEUTROS ABS: 6.2 10*3/uL (ref 1.5–6.5)
Platelets: 224 10*3/uL (ref 145–400)
RBC: 4.05 10*6/uL (ref 3.70–5.45)
RDW: 12.1 % (ref 11.2–14.5)
WBC: 8.4 10*3/uL (ref 3.9–10.3)
lymph#: 1.1 10*3/uL (ref 0.9–3.3)

## 2014-08-30 LAB — COMPREHENSIVE METABOLIC PANEL (CC13)
ALK PHOS: 97 U/L (ref 40–150)
ALT: 31 U/L (ref 0–55)
ANION GAP: 11 meq/L (ref 3–11)
AST: 25 U/L (ref 5–34)
Albumin: 3.2 g/dL — ABNORMAL LOW (ref 3.5–5.0)
BUN: 12.3 mg/dL (ref 7.0–26.0)
CO2: 29 mEq/L (ref 22–29)
CREATININE: 0.7 mg/dL (ref 0.6–1.1)
Calcium: 9.4 mg/dL (ref 8.4–10.4)
Chloride: 98 mEq/L (ref 98–109)
EGFR: >90 mL/min/{1.73_m2} (ref >90)
Glucose: 135 mg/dl (ref 70–140)
Potassium: 4.2 mEq/L (ref 3.5–5.1)
Sodium: 138 mEq/L (ref 136–145)
TOTAL PROTEIN: 6.9 g/dL (ref 6.4–8.3)
Total Bilirubin: 0.56 mg/dL (ref 0.20–1.20)

## 2014-08-30 NOTE — Progress Notes (Signed)
  Radiation Oncology         (336) [phone] ________________________________  Name: Linda Donovan MRN: [id number]  Date: 08/29/2014  DOB: 10-Apr-1956  SIMULATION AND TREATMENT PLANNING NOTE    ICD-9-CM ICD-10-CM   1. Non-small cell lung cancer, right 162.9 C34.91     DIAGNOSIS:  Recurrent Non-small cell lung cancer  NARRATIVE:  The patient was brought to the [facility] suite.  Identity was confirmed.  All relevant records and images related to the planned course of therapy were reviewed.  The patient freely provided informed written consent to proceed with treatment after reviewing the details related to the planned course of therapy. The consent form was witnessed and verified by the simulation staff.  Then, the patient was set-up in a stable reproducible  supine position for radiation therapy.  CT images were obtained.  Surface markings were placed.  The CT images were loaded into the planning software.  Then the target and avoidance structures were contoured.  Treatment planning then occurred.  The radiation prescription was entered and confirmed.  Then, I designed and supervised the construction of a total of 1 medically necessary complex treatment devices.  I have requested : Intensity Modulated Radiotherapy (IMRT) is medically necessary for this case for the following reason:  Previous treatment to this area..  I have ordered:dose calc.  PLAN:  The patient will receive 25 Gy in 10 fractions.  ________________________________  Special treatment Procedure  Additional time was taken in reviewing the patients current setup as it relates to her previous tx. Given this additional time and increased risks for complications, this constitutes a special treatment procedure.    Blair Promise, PhD, MD

## 2014-09-03 ENCOUNTER — Ambulatory Visit: Payer: BC Managed Care – PPO | Admitting: Radiation Oncology

## 2014-09-03 ENCOUNTER — Encounter: Payer: Self-pay | Admitting: Radiation Oncology

## 2014-09-03 ENCOUNTER — Ambulatory Visit
Admission: RE | Admit: 2014-09-03 | Discharge: 2014-09-03 | Disposition: A | Discharge status: Home / Self Care | Payer: BLUE CROSS/BLUE SHIELD | Source: Ambulatory Visit | Attending: Radiation Oncology | Admitting: Radiation Oncology

## 2014-09-03 VITALS — BP 116/69 | HR 114 | Temp 98.3°F | Resp 20

## 2014-09-03 DIAGNOSIS — C3491 Malignant neoplasm of unspecified part of right bronchus or lung: Secondary | ICD-10-CM

## 2014-09-03 NOTE — Progress Notes (Signed)
Linda Donovan denies having any pain.  She reports her cough has improved with the Tussionex.  She reports coughing up green sputum occasionally.  Her oxygen saturation today on 2 L was 98%.  She had chemotherapy 2 weeks ago and will have it again 09/13/14.  She will start radiation tomorrow.  BP 116/69 mmHg  Pulse 114  Temp(Src) 98.3 F (36.8 C) (Oral)  Resp 20  SpO2 98%

## 2014-09-04 ENCOUNTER — Ambulatory Visit: Payer: BC Managed Care – PPO

## 2014-09-04 ENCOUNTER — Ambulatory Visit
Admission: RE | Admit: 2014-09-04 | Discharge: 2014-09-04 | Disposition: A | Discharge status: Home / Self Care | Payer: BLUE CROSS/BLUE SHIELD | Source: Ambulatory Visit | Attending: Radiation Oncology | Admitting: Radiation Oncology

## 2014-09-04 DIAGNOSIS — C3491 Malignant neoplasm of unspecified part of right bronchus or lung: Secondary | ICD-10-CM

## 2014-09-04 NOTE — Progress Notes (Signed)
  Radiation Oncology         [phone]) [phone] ________________________________  Name: Linda Donovan MRN: [id number]  Date: 09/04/2014  DOB: 26-Sep-1955  Simulation Verification Note    ICD-9-CM ICD-10-CM   1. Non-small cell lung cancer, right 162.9 C34.91     Status: outpatient  NARRATIVE: The patient was brought to the treatment unit and placed in the planned treatment position. The clinical setup was verified. Then port films were obtained and uploaded to the radiation oncology medical record software.  The treatment beams were carefully compared against the planned radiation fields. The position location and shape of the radiation fields was reviewed. They targeted volume of tissue appears to be appropriately covered by the radiation beams. Organs at risk appear to be excluded as planned.  Based on my personal review, I approved the simulation verification. The patient's treatment will proceed as planned.  -----------------------------------  Blair Promise, PhD, MD

## 2014-09-05 ENCOUNTER — Ambulatory Visit
Admission: RE | Admit: 2014-09-05 | Discharge: 2014-09-05 | Disposition: A | Discharge status: Home / Self Care | Payer: BLUE CROSS/BLUE SHIELD | Source: Ambulatory Visit | Attending: Radiation Oncology | Admitting: Radiation Oncology

## 2014-09-05 DIAGNOSIS — C3491 Malignant neoplasm of unspecified part of right bronchus or lung: Secondary | ICD-10-CM | POA: Diagnosis not present

## 2014-09-06 ENCOUNTER — Other Ambulatory Visit (HOSPITAL_BASED_OUTPATIENT_CLINIC_OR_DEPARTMENT_OTHER): Payer: BLUE CROSS/BLUE SHIELD

## 2014-09-06 ENCOUNTER — Ambulatory Visit
Admission: RE | Admit: 2014-09-06 | Discharge: 2014-09-06 | Disposition: A | Discharge status: Home / Self Care | Payer: BLUE CROSS/BLUE SHIELD | Source: Ambulatory Visit | Attending: Radiation Oncology | Admitting: Radiation Oncology

## 2014-09-06 DIAGNOSIS — C3490 Malignant neoplasm of unspecified part of unspecified bronchus or lung: Secondary | ICD-10-CM

## 2014-09-06 DIAGNOSIS — C3491 Malignant neoplasm of unspecified part of right bronchus or lung: Secondary | ICD-10-CM | POA: Diagnosis not present

## 2014-09-06 LAB — COMPREHENSIVE METABOLIC PANEL (CC13)
ALK PHOS: 112 U/L (ref 40–150)
ALT: 25 U/L (ref 0–55)
AST: 19 U/L (ref 5–34)
Albumin: 2.8 g/dL — ABNORMAL LOW (ref 3.5–5.0)
Anion Gap: 10 mEq/L (ref 3–11)
BUN: 5.6 mg/dL — ABNORMAL LOW (ref 7.0–26.0)
CO2: 31 mEq/L — ABNORMAL HIGH (ref 22–29)
CREATININE: 0.6 mg/dL (ref 0.6–1.1)
Calcium: 9.7 mg/dL (ref 8.4–10.4)
Chloride: 99 mEq/L (ref 98–109)
EGFR: >90 mL/min/{1.73_m2} (ref >90)
Glucose: 171 mg/dl — ABNORMAL HIGH (ref 70–140)
Potassium: 4 mEq/L (ref 3.5–5.1)
Sodium: 139 mEq/L (ref 136–145)
Total Bilirubin: 0.34 mg/dL (ref 0.20–1.20)
Total Protein: 6.8 g/dL (ref 6.4–8.3)

## 2014-09-06 LAB — CBC WITH DIFFERENTIAL/PLATELET
BASO%: 0.1 % (ref 0.0–2.0)
Basophils Absolute: 0 10*3/uL (ref 0.0–0.1)
EOS ABS: 0.1 10*3/uL (ref 0.0–0.5)
EOS%: 0.7 % (ref 0.0–7.0)
HCT: 37.1 % (ref 34.8–46.6)
HGB: 12.7 g/dL (ref 11.6–15.9)
LYMPH#: 0.7 10*3/uL — AB (ref 0.9–3.3)
LYMPH%: 7.7 % — AB (ref 14.0–49.7)
MCH: 33.8 pg (ref 25.1–34.0)
MCHC: 34.2 g/dL (ref 31.5–36.0)
MCV: 98.7 fL (ref 79.5–101.0)
MONO#: 0.6 10*3/uL (ref 0.1–0.9)
MONO%: 7.2 % (ref 0.0–14.0)
NEUT#: 7.3 10*3/uL — ABNORMAL HIGH (ref 1.5–6.5)
NEUT%: 84.3 % — AB (ref 38.4–76.8)
Platelets: 188 10*3/uL (ref 145–400)
RBC: 3.76 10*6/uL (ref 3.70–5.45)
RDW: 12.1 % (ref 11.2–14.5)
WBC: 8.7 10*3/uL (ref 3.9–10.3)

## 2014-09-09 ENCOUNTER — Ambulatory Visit
Admission: RE | Admit: 2014-09-09 | Discharge: 2014-09-09 | Disposition: A | Discharge status: Home / Self Care | Payer: BLUE CROSS/BLUE SHIELD | Source: Ambulatory Visit | Attending: Radiation Oncology | Admitting: Radiation Oncology

## 2014-09-09 DIAGNOSIS — C3491 Malignant neoplasm of unspecified part of right bronchus or lung: Secondary | ICD-10-CM | POA: Diagnosis not present

## 2014-09-10 ENCOUNTER — Ambulatory Visit
Admission: RE | Admit: 2014-09-10 | Discharge: 2014-09-10 | Disposition: A | Discharge status: Home / Self Care | Payer: BLUE CROSS/BLUE SHIELD | Source: Ambulatory Visit | Attending: Radiation Oncology | Admitting: Radiation Oncology

## 2014-09-10 ENCOUNTER — Encounter: Payer: Self-pay | Admitting: Radiation Oncology

## 2014-09-10 VITALS — BP 119/76 | HR 116 | Temp 98.6°F | Resp 20 | Wt 135.6 lb

## 2014-09-10 DIAGNOSIS — C3491 Malignant neoplasm of unspecified part of right bronchus or lung: Secondary | ICD-10-CM

## 2014-09-10 MED ORDER — LEVOFLOXACIN 500 MG PO TABS: 500.0000 mg | ORAL_TABLET | Freq: Every day | ORAL | Status: DC

## 2014-09-10 MED ORDER — HYDROCOD POLST-CHLORPHEN POLST 10-8 MG/5ML PO LQCR: 5.0000 mL | Freq: Two times a day (BID) | ORAL | Status: DC | PRN

## 2014-09-10 MED ORDER — SUCRALFATE 1 G PO TABS: 1.0000 g | ORAL_TABLET | Freq: Three times a day (TID) | ORAL | Status: AC

## 2014-09-10 NOTE — Progress Notes (Addendum)
Patient requesting refill of Tussinex. Also, patient wants "the antibiotic Dr. Sondra Come mentioned last week." reports fatigue. Oxygen therapy  2 liters via nasal cannula noted. Denies hemoptysis. Reports a frequent cough that is occasionally productive. Reports difficulty swallowing. Reports drinking two Ensure cans per day. Reports frequent headaches related to cough. Weight and vitals stable.   Patient treated before. Patient understands to use Radiaplex bid but, not within the four hour window prior to treatment. Verbalizes understanding of potential side effects and management such as, increased cough, difficulty swallowing, fatigue and skin changes. Patient confirms she has her RADIATION THERAPY AND YOU handbook at home from prior treatment. Patient understands to contact staff with future needs.

## 2014-09-10 NOTE — Progress Notes (Signed)
  Radiation Oncology         [phone]) [phone] ________________________________  Name: EBELYN BOHNET MRN: [id number]  Date: 09/10/2014  DOB: 1956-03-09  Weekly Radiation Therapy Management  Current Dose: 12.5 Gy     Planned Dose:  25 Gy  Narrative . . . . . . . . The patient presents for routine under treatment assessment.                                   She admits to feeling poorly. Patient is coughing a lot. No reports of chills or fever. She admits to Green sputum production.  [facility] beam imaging suggests possible consolidation in the right upper chest                                 Set-up films were reviewed.                                 The chart was checked. Physical Findings. . .  weight is 135 lb 9.6 oz (61.508 kg). Her oral temperature is 98.6 F (37 C). Her blood pressure is 119/76 and her pulse is 116. Her respiration is 20 and oxygen saturation is 94%. . The patient has a lot of upper airway noise. Pulse is elevated at 116 and regular.  Impression . . . . . . . The patient is tolerating radiation. Plan . . . . . . . . . . . . Continue treatment as planned. The patient is been given a prescription for Carafate, refill on her Tussionex and will be placed on Levaquin for antibiotic coverage.  ________________________________   Blair Promise, PhD, MD

## 2014-09-11 ENCOUNTER — Ambulatory Visit
Admission: RE | Admit: 2014-09-11 | Discharge: 2014-09-11 | Disposition: A | Discharge status: Home / Self Care | Payer: BLUE CROSS/BLUE SHIELD | Source: Ambulatory Visit | Attending: Radiation Oncology | Admitting: Radiation Oncology

## 2014-09-11 DIAGNOSIS — C3491 Malignant neoplasm of unspecified part of right bronchus or lung: Secondary | ICD-10-CM | POA: Diagnosis not present

## 2014-09-12 ENCOUNTER — Encounter: Payer: Self-pay | Admitting: Physician Assistant

## 2014-09-12 ENCOUNTER — Emergency Department (HOSPITAL_COMMUNITY): Payer: BLUE CROSS/BLUE SHIELD

## 2014-09-12 ENCOUNTER — Other Ambulatory Visit: Payer: Self-pay | Admitting: Internal Medicine

## 2014-09-12 ENCOUNTER — Ambulatory Visit (HOSPITAL_BASED_OUTPATIENT_CLINIC_OR_DEPARTMENT_OTHER): Payer: BLUE CROSS/BLUE SHIELD | Admitting: Physician Assistant

## 2014-09-12 ENCOUNTER — Telehealth: Payer: Self-pay | Admitting: Internal Medicine

## 2014-09-12 ENCOUNTER — Ambulatory Visit
Admission: RE | Admit: 2014-09-12 | Discharge: 2014-09-12 | Disposition: A | Discharge status: Home / Self Care | Payer: BLUE CROSS/BLUE SHIELD | Source: Ambulatory Visit | Attending: Radiation Oncology | Admitting: Radiation Oncology

## 2014-09-12 ENCOUNTER — Other Ambulatory Visit: Payer: Self-pay

## 2014-09-12 ENCOUNTER — Inpatient Hospital Stay (HOSPITAL_COMMUNITY)
Admission: EM | Admit: 2014-09-12 | Discharge: 2014-09-21 | DRG: 190 | Disposition: A | Discharge status: Hospice | Payer: BLUE CROSS/BLUE SHIELD | Attending: Internal Medicine | Admitting: Internal Medicine

## 2014-09-12 ENCOUNTER — Encounter (HOSPITAL_COMMUNITY): Payer: Self-pay | Admitting: Emergency Medicine

## 2014-09-12 ENCOUNTER — Other Ambulatory Visit (HOSPITAL_BASED_OUTPATIENT_CLINIC_OR_DEPARTMENT_OTHER): Payer: BLUE CROSS/BLUE SHIELD

## 2014-09-12 ENCOUNTER — Other Ambulatory Visit (HOSPITAL_COMMUNITY): Payer: BLUE CROSS/BLUE SHIELD

## 2014-09-12 VITALS — BP 135/57 | HR 109 | Temp 98.8°F | Resp 17 | Ht 63.0 in | Wt 135.5 lb

## 2014-09-12 DIAGNOSIS — F900 Attention-deficit hyperactivity disorder, predominantly inattentive type: Secondary | ICD-10-CM | POA: Diagnosis present

## 2014-09-12 DIAGNOSIS — Z85118 Personal history of other malignant neoplasm of bronchus and lung: Secondary | ICD-10-CM | POA: Diagnosis not present

## 2014-09-12 DIAGNOSIS — J45909 Unspecified asthma, uncomplicated: Secondary | ICD-10-CM | POA: Diagnosis present

## 2014-09-12 DIAGNOSIS — R778 Other specified abnormalities of plasma proteins: Secondary | ICD-10-CM | POA: Diagnosis present

## 2014-09-12 DIAGNOSIS — Z87891 Personal history of nicotine dependence: Secondary | ICD-10-CM

## 2014-09-12 DIAGNOSIS — I272 Other secondary pulmonary hypertension: Secondary | ICD-10-CM | POA: Diagnosis present

## 2014-09-12 DIAGNOSIS — Z7189 Other specified counseling: Secondary | ICD-10-CM

## 2014-09-12 DIAGNOSIS — I5023 Acute on chronic systolic (congestive) heart failure: Secondary | ICD-10-CM | POA: Diagnosis present

## 2014-09-12 DIAGNOSIS — Z7982 Long term (current) use of aspirin: Secondary | ICD-10-CM | POA: Diagnosis not present

## 2014-09-12 DIAGNOSIS — F4323 Adjustment disorder with mixed anxiety and depressed mood: Secondary | ICD-10-CM

## 2014-09-12 DIAGNOSIS — C3491 Malignant neoplasm of unspecified part of right bronchus or lung: Secondary | ICD-10-CM

## 2014-09-12 DIAGNOSIS — C3490 Malignant neoplasm of unspecified part of unspecified bronchus or lung: Secondary | ICD-10-CM

## 2014-09-12 DIAGNOSIS — C3411 Malignant neoplasm of upper lobe, right bronchus or lung: Secondary | ICD-10-CM

## 2014-09-12 DIAGNOSIS — J181 Lobar pneumonia, unspecified organism: Secondary | ICD-10-CM | POA: Diagnosis not present

## 2014-09-12 DIAGNOSIS — Z515 Encounter for palliative care: Secondary | ICD-10-CM | POA: Diagnosis not present

## 2014-09-12 DIAGNOSIS — Y842 Radiological procedure and radiotherapy as the cause of abnormal reaction of the patient, or of later complication, without mention of misadventure at the time of the procedure: Secondary | ICD-10-CM | POA: Diagnosis present

## 2014-09-12 DIAGNOSIS — I071 Rheumatic tricuspid insufficiency: Secondary | ICD-10-CM | POA: Diagnosis present

## 2014-09-12 DIAGNOSIS — I5021 Acute systolic (congestive) heart failure: Secondary | ICD-10-CM | POA: Diagnosis not present

## 2014-09-12 DIAGNOSIS — R0902 Hypoxemia: Secondary | ICD-10-CM

## 2014-09-12 DIAGNOSIS — J700 Acute pulmonary manifestations due to radiation: Secondary | ICD-10-CM | POA: Diagnosis present

## 2014-09-12 DIAGNOSIS — R0602 Shortness of breath: Secondary | ICD-10-CM | POA: Diagnosis present

## 2014-09-12 DIAGNOSIS — R000 Tachycardia, unspecified: Secondary | ICD-10-CM | POA: Diagnosis not present

## 2014-09-12 DIAGNOSIS — I420 Dilated cardiomyopathy: Secondary | ICD-10-CM | POA: Diagnosis not present

## 2014-09-12 DIAGNOSIS — J441 Chronic obstructive pulmonary disease with (acute) exacerbation: Secondary | ICD-10-CM | POA: Diagnosis present

## 2014-09-12 DIAGNOSIS — F419 Anxiety disorder, unspecified: Secondary | ICD-10-CM | POA: Diagnosis present

## 2014-09-12 DIAGNOSIS — C778 Secondary and unspecified malignant neoplasm of lymph nodes of multiple regions: Secondary | ICD-10-CM | POA: Diagnosis not present

## 2014-09-12 DIAGNOSIS — J9621 Acute and chronic respiratory failure with hypoxia: Secondary | ICD-10-CM | POA: Diagnosis present

## 2014-09-12 DIAGNOSIS — R7989 Other specified abnormal findings of blood chemistry: Secondary | ICD-10-CM | POA: Diagnosis present

## 2014-09-12 DIAGNOSIS — J449 Chronic obstructive pulmonary disease, unspecified: Secondary | ICD-10-CM | POA: Diagnosis not present

## 2014-09-12 DIAGNOSIS — Z66 Do not resuscitate: Secondary | ICD-10-CM | POA: Diagnosis present

## 2014-09-12 DIAGNOSIS — I519 Heart disease, unspecified: Secondary | ICD-10-CM | POA: Diagnosis not present

## 2014-09-12 DIAGNOSIS — Z9981 Dependence on supplemental oxygen: Secondary | ICD-10-CM | POA: Diagnosis not present

## 2014-09-12 DIAGNOSIS — R0600 Dyspnea, unspecified: Secondary | ICD-10-CM | POA: Diagnosis present

## 2014-09-12 DIAGNOSIS — J189 Pneumonia, unspecified organism: Secondary | ICD-10-CM

## 2014-09-12 DIAGNOSIS — Z923 Personal history of irradiation: Secondary | ICD-10-CM | POA: Diagnosis not present

## 2014-09-12 DIAGNOSIS — I5189 Other ill-defined heart diseases: Secondary | ICD-10-CM | POA: Insufficient documentation

## 2014-09-12 LAB — CBC WITH DIFFERENTIAL/PLATELET
BASO%: 0.1 % (ref 0.0–2.0)
BASOS ABS: 0 10*3/uL (ref 0.0–0.1)
BASOS PCT: 0 % (ref 0–1)
Basophils Absolute: 0 10*3/uL (ref 0.0–0.1)
EOS ABS: 0 10*3/uL (ref 0.0–0.7)
EOS PCT: 0 % (ref 0–5)
EOS%: 0.1 % (ref 0.0–7.0)
Eosinophils Absolute: 0 10*3/uL (ref 0.0–0.5)
HCT: 36.6 % (ref 34.8–46.6)
HCT: 42.9 % (ref 36.0–46.0)
HGB: 12.2 g/dL (ref 11.6–15.9)
Hemoglobin: 14.4 g/dL (ref 12.0–15.0)
LYMPH#: 0.1 10*3/uL — AB (ref 0.9–3.3)
LYMPH%: 3.2 % — ABNORMAL LOW (ref 14.0–49.7)
Lymphocytes Relative: 4 % — ABNORMAL LOW (ref 12–46)
Lymphs Abs: 0.2 10*3/uL — ABNORMAL LOW (ref 0.7–4.0)
MCH: 32.9 pg (ref 25.1–34.0)
MCH: 33.3 pg (ref 26.0–34.0)
MCHC: 33.2 g/dL (ref 31.5–36.0)
MCHC: 33.6 g/dL (ref 30.0–36.0)
MCV: 98.9 fL (ref 79.5–101.0)
MCV: 99.1 fL (ref 78.0–100.0)
MONO#: 0.1 10*3/uL (ref 0.1–0.9)
MONO%: 2.4 % (ref 0.0–14.0)
MONOS PCT: 1 % — AB (ref 3–12)
Monocytes Absolute: 0.1 10*3/uL (ref 0.1–1.0)
NEUT#: 4.1 10*3/uL (ref 1.5–6.5)
NEUT%: 94.2 % — ABNORMAL HIGH (ref 38.4–76.8)
NEUTROS ABS: 4.6 10*3/uL (ref 1.7–7.7)
Neutrophils Relative %: 95 % — ABNORMAL HIGH (ref 43–77)
Platelets: 586 10*3/uL — ABNORMAL HIGH (ref 145–400)
Platelets: 511 10*3/uL — ABNORMAL HIGH (ref 150–400)
RBC: 3.7 10*6/uL (ref 3.70–5.45)
RBC: 4.33 MIL/uL (ref 3.87–5.11)
RDW: 12.9 % (ref 11.2–14.5)
RDW: 12.8 % (ref 11.5–15.5)
WBC: 4.4 10*3/uL (ref 3.9–10.3)
WBC: 4.9 10*3/uL (ref 4.0–10.5)

## 2014-09-12 LAB — COMPREHENSIVE METABOLIC PANEL (CC13)
ALK PHOS: 110 U/L (ref 40–150)
ALT: 21 U/L (ref 0–55)
AST: 23 U/L (ref 5–34)
Albumin: 3 g/dL — ABNORMAL LOW (ref 3.5–5.0)
Anion Gap: 12 mEq/L — ABNORMAL HIGH (ref 3–11)
BUN: 6.6 mg/dL — ABNORMAL LOW (ref 7.0–26.0)
CO2: 26 meq/L (ref 22–29)
CREATININE: 0.6 mg/dL (ref 0.6–1.1)
Calcium: 9.3 mg/dL (ref 8.4–10.4)
Chloride: 99 mEq/L (ref 98–109)
GLUCOSE: 188 mg/dL — AB (ref 70–140)
Potassium: 4 mEq/L (ref 3.5–5.1)
Sodium: 138 mEq/L (ref 136–145)
TOTAL PROTEIN: 7.2 g/dL (ref 6.4–8.3)
Total Bilirubin: 0.29 mg/dL (ref 0.20–1.20)

## 2014-09-12 LAB — I-STAT CG4 LACTIC ACID, ED: Lactic Acid, Venous: 1.52 mmol/L (ref 0.5–2.0)

## 2014-09-12 LAB — I-STAT TROPONIN, ED: Troponin i, poc: 0.15 ng/mL (ref 0.00–0.08)

## 2014-09-12 LAB — TROPONIN I: TROPONIN I: 0.53 ng/mL — AB (ref <0.031)

## 2014-09-12 MED ORDER — SUCRALFATE 1 G PO TABS
1.0000 g | ORAL_TABLET | Freq: Three times a day (TID) | ORAL | Status: DC
  Administered 2014-09-12 – 2014-09-21 (×33): 1 g via ORAL
  Filled 2014-09-12 (×37): qty 1

## 2014-09-12 MED ORDER — DEXTROSE 5 % IV SOLN
1.0000 g | Freq: Once | INTRAVENOUS | Status: AC
  Administered 2014-09-12: 1 g via INTRAVENOUS
  Filled 2014-09-12: qty 10

## 2014-09-12 MED ORDER — TRAZODONE HCL 50 MG PO TABS
50.0000 mg | ORAL_TABLET | Freq: Every day | ORAL | Status: DC
  Administered 2014-09-12 – 2014-09-20 (×9): 50 mg via ORAL
  Filled 2014-09-12 (×11): qty 1

## 2014-09-12 MED ORDER — ALBUTEROL SULFATE HFA 108 (90 BASE) MCG/ACT IN AERS: 2.0000 | INHALATION_SPRAY | Freq: Four times a day (QID) | RESPIRATORY_TRACT | Status: DC | PRN

## 2014-09-12 MED ORDER — POTASSIUM 75 MG PO TABS: 1.0000 | ORAL_TABLET | Freq: Every day | ORAL | Status: DC

## 2014-09-12 MED ORDER — ALBUTEROL SULFATE (2.5 MG/3ML) 0.083% IN NEBU
2.5000 mg | INHALATION_SOLUTION | Freq: Four times a day (QID) | RESPIRATORY_TRACT | Status: DC | PRN
  Administered 2014-09-12 – 2014-09-16 (×3): 2.5 mg via RESPIRATORY_TRACT
  Filled 2014-09-12 (×4): qty 3

## 2014-09-12 MED ORDER — IPRATROPIUM-ALBUTEROL 0.5-2.5 (3) MG/3ML IN SOLN
3.0000 mL | RESPIRATORY_TRACT | Status: DC
  Administered 2014-09-13 – 2014-09-14 (×6): 3 mL via RESPIRATORY_TRACT
  Filled 2014-09-12 (×6): qty 3

## 2014-09-12 MED ORDER — MAGNESIUM OXIDE 400 (241.3 MG) MG PO TABS
400.0000 mg | ORAL_TABLET | Freq: Every day | ORAL | Status: DC
  Administered 2014-09-12 – 2014-09-14 (×3): 400 mg via ORAL
  Filled 2014-09-12 (×4): qty 1

## 2014-09-12 MED ORDER — OXYCODONE-ACETAMINOPHEN 5-325 MG PO TABS
1.0000 | ORAL_TABLET | ORAL | Status: DC | PRN
  Administered 2014-09-13 – 2014-09-17 (×19): 1 via ORAL
  Filled 2014-09-12 (×19): qty 1

## 2014-09-12 MED ORDER — DEXTROSE 5 % IV SOLN
1.0000 g | INTRAVENOUS | Status: DC
  Administered 2014-09-13 – 2014-09-17 (×5): 1 g via INTRAVENOUS
  Filled 2014-09-12 (×6): qty 10

## 2014-09-12 MED ORDER — ALBUTEROL (5 MG/ML) CONTINUOUS INHALATION SOLN
10.0000 mg/h | INHALATION_SOLUTION | Freq: Once | RESPIRATORY_TRACT | Status: AC
  Administered 2014-09-12: 10 mg/h via RESPIRATORY_TRACT
  Filled 2014-09-12: qty 20

## 2014-09-12 MED ORDER — ENOXAPARIN SODIUM 40 MG/0.4ML ~~LOC~~ SOLN
40.0000 mg | SUBCUTANEOUS | Status: DC
  Administered 2014-09-12 – 2014-09-20 (×9): 40 mg via SUBCUTANEOUS
  Filled 2014-09-12 (×9): qty 0.4

## 2014-09-12 MED ORDER — AZITHROMYCIN 500 MG PO TABS
500.0000 mg | ORAL_TABLET | ORAL | Status: DC
  Administered 2014-09-13 – 2014-09-14 (×2): 500 mg via ORAL
  Filled 2014-09-12 (×2): qty 1

## 2014-09-12 MED ORDER — FOLIC ACID 1 MG PO TABS
1.0000 mg | ORAL_TABLET | Freq: Every day | ORAL | Status: DC
  Administered 2014-09-13 – 2014-09-21 (×8): 1 mg via ORAL
  Filled 2014-09-12 (×9): qty 1

## 2014-09-12 MED ORDER — ASPIRIN 325 MG PO TABS
325.0000 mg | ORAL_TABLET | Freq: Once | ORAL | Status: AC
  Administered 2014-09-12: 325 mg via ORAL
  Filled 2014-09-12: qty 1

## 2014-09-12 MED ORDER — METOPROLOL SUCCINATE ER 25 MG PO TB24
25.0000 mg | ORAL_TABLET | Freq: Every day | ORAL | Status: DC
  Filled 2014-09-12: qty 1

## 2014-09-12 MED ORDER — BUDESONIDE-FORMOTEROL FUMARATE 160-4.5 MCG/ACT IN AERO
2.0000 | INHALATION_SPRAY | Freq: Two times a day (BID) | RESPIRATORY_TRACT | Status: DC
  Administered 2014-09-12 – 2014-09-18 (×12): 2 via RESPIRATORY_TRACT
  Filled 2014-09-12: qty 6

## 2014-09-12 MED ORDER — OSELTAMIVIR PHOSPHATE 75 MG PO CAPS
75.0000 mg | ORAL_CAPSULE | Freq: Two times a day (BID) | ORAL | Status: DC
  Administered 2014-09-12 – 2014-09-13 (×2): 75 mg via ORAL
  Filled 2014-09-12 (×2): qty 1

## 2014-09-12 MED ORDER — SODIUM CHLORIDE 0.9 % IV BOLUS (SEPSIS)
1000.0000 mL | Freq: Once | INTRAVENOUS | Status: AC
  Administered 2014-09-12: 1000 mL via INTRAVENOUS

## 2014-09-12 MED ORDER — IOHEXOL 350 MG/ML SOLN
100.0000 mL | Freq: Once | INTRAVENOUS | Status: AC | PRN
  Administered 2014-09-12: 80 mL via INTRAVENOUS

## 2014-09-12 MED ORDER — ASPIRIN 81 MG PO CHEW
81.0000 mg | CHEWABLE_TABLET | Freq: Every day | ORAL | Status: DC
  Administered 2014-09-13 – 2014-09-21 (×9): 81 mg via ORAL
  Filled 2014-09-12 (×9): qty 1

## 2014-09-12 MED ORDER — HYDROCOD POLST-CHLORPHEN POLST 10-8 MG/5ML PO LQCR
5.0000 mL | Freq: Two times a day (BID) | ORAL | Status: DC | PRN
  Administered 2014-09-17: 5 mL via ORAL
  Filled 2014-09-12: qty 5

## 2014-09-12 MED ORDER — DEXTROSE 5 % IV SOLN
500.0000 mg | Freq: Once | INTRAVENOUS | Status: AC
  Administered 2014-09-12: 500 mg via INTRAVENOUS
  Filled 2014-09-12 (×2): qty 500

## 2014-09-12 MED ORDER — SODIUM CHLORIDE 0.9 % IV SOLN
INTRAVENOUS | Status: DC
  Administered 2014-09-12: 23:00:00 via INTRAVENOUS

## 2014-09-12 NOTE — Progress Notes (Signed)
[place] [phone]   [phone]  OFFICE PROGRESS NOTE   DIAGNOSIS: Recurrent non-small cell lung cancer initially diagnosed as Stage IIIA non-small cell lung cancer, adenocarcinoma with negative EGFR mutation and negative ALK gene translocation diagnosed in March of 2014   PRIOR THERAPY:  1) Concurrent chemoradiation with weekly carboplatin for AUC of 2 and paclitaxel 45 mg/M2, status post 8 cycles, last dose was given on 11/13/2012 with partial response.  2) Consolidation chemotherapy with carboplatin for AUC of 5 on day 1 and gemcitabine 1000 mg/M2 on days 1 and 8 every 3 weeks, status post 3 cycles, last dose was given 02/20/2013 with mild improvement in her disease . First cycle was given on 01/02/2013.  3) status post subxiphoid pericardial window under the care of Dr. Roxan Hockey on 01/04/2014 and the final pathology showed no evidence for malignancy.  CURRENT THERAPY: Systemic chemotherapy with carboplatin for AUC of 5 and Alimta 500 MG/M2 every 3 weeks. First dose 08/20/2014. Status post 1 cycle  CHEMOTHERAPY INTENT: Control  CURRENT # OF CHEMOTHERAPY CYCLES: 1  CURRENT ANTIEMETICS: Zofran, dexamethasone and Compazine  CURRENT SMOKING STATUS: Former smoker, quit in [place]: None  CURRENT BISPHOSPHONATES USE: None  PAIN MANAGEMENT: 8/10, currently Percocet 5/325, 1-2 tabs q 4 hours  NARCOTICS INDUCED CONSTIPATION: None  LIVING WILL AND CODE STATUS: Full code  INTERVAL HISTORY: Linda Donovan 59 y.o. female returns to the clinic today for followup visit accompanied by her husband. She reports that she completed a course of antibiotics (doxycycline) and Tussionex for bronchitis versus pneumonia. She is currently on a 10 day course of Levaquin and only is been on for the past 3 days. She denied any fever. She presents for evaluation prior to the start of cycle #2 of her systemic chemotherapy with carboplatin and  Alimta. She is currently on home oxygen. She reports an inability to stand for long periods of time while in the shower due to weakness. T She denied having any nausea or vomiting, no fever or chills. She has no weight loss or night sweats.   MEDICAL HISTORY: Past Medical History  Diagnosis Date  . COPD (chronic obstructive pulmonary disease)   . ADD (attention deficit disorder)   . Hx of radiation therapy 09/28/12- 11/17/12    RU lung mass, mediastinum chest, 63 gray 35 fx  . Shortness of breath   . Asthma   . Pneumonia   . Arthritis   . Non-small cell lung cancer 09/18/2012    RUL  . Asthma     ALLERGIES:  is allergic to clinoril and morphine.  MEDICATIONS:  Current Outpatient Prescriptions  Medication Sig Dispense Refill  . albuterol (PROVENTIL HFA;VENTOLIN HFA) 108 (90 BASE) MCG/ACT inhaler Inhale 2 puffs into the lungs every 6 (six) hours as needed for wheezing. 8.5 g 5  . albuterol (PROVENTIL) (2.5 MG/3ML) 0.083% nebulizer solution Take 2.5 mg by nebulization every 6 (six) hours as needed for wheezing or shortness of breath.    . amphetamine-dextroamphetamine (ADDERALL) 20 MG tablet Take 20 mg by mouth daily before breakfast.     . aspirin 81 MG tablet Take 81 mg by mouth daily.    . budesonide-formoterol (SYMBICORT) 160-4.5 MCG/ACT inhaler Inhale 2 puffs into the lungs 2 (two) times daily. 1 Inhaler 0  . Calcium Carb-Ergocalciferol 250-125 MG-UNIT TABS Take 1 tablet by mouth daily.     . calcium-vitamin D (OSCAL WITH D) 250-125 MG-UNIT per  tablet Take 1 tablet by mouth daily.    . chlorpheniramine-HYDROcodone (TUSSIONEX) 10-8 MG/5ML LQCR Take 5 mLs by mouth every 12 (twelve) hours as needed for cough. 115 mL 0  . dexamethasone (DECADRON) 4 MG tablet 4 mg by mouth twice a day the day before, day of and day after the chemotherapy every 3 weeks. 40 tablet 1  . doxycycline (VIBRA-TABS) 100 MG tablet Take 1 tablet (100 mg total) by mouth 2 (two) times daily. 20 tablet 0  . folic acid  (FOLVITE) 1 MG tablet Take 1 tablet (1 mg total) by mouth daily. 30 tablet 4  . HYDROcodone-homatropine (HYCODAN) 5-1.5 MG/5ML syrup Take 5 mLs by mouth every 6 (six) hours as needed for cough. 120 mL 0  . levofloxacin (LEVAQUIN) 500 MG tablet Take 1 tablet (500 mg total) by mouth daily. 10 tablet 0  . magnesium oxide (MAG-OX) 400 MG tablet Take 1 tablet (400 mg total) by mouth daily. 60 tablet 2  . metoprolol succinate (TOPROL-XL) 25 MG 24 hr tablet Take 1 tablet (25 mg total) by mouth daily. 60 tablet 5  . Multiple Vitamins-Minerals (MULTIVITAMIN WITH MINERALS) tablet Take 1 tablet by mouth daily.    Marland Kitchen oxyCODONE-acetaminophen (PERCOCET/ROXICET) 5-325 MG per tablet Take 1 tablet by mouth every 4 (four) hours as needed for severe pain. 30 tablet 0  . OXYGEN Inhale 2 L into the lungs continuous.    . Potassium 75 MG TABS Take 1 tablet by mouth daily.    . prochlorperazine (COMPAZINE) 10 MG tablet Take 1 tablet (10 mg total) by mouth every 6 (six) hours as needed for nausea or vomiting. 30 tablet 0  . sucralfate (CARAFATE) 1 G tablet Take 1 tablet (1 g total) by mouth 4 (four) times daily -  with meals and at bedtime. Dissolve in 10 ml of water prior to taking 120 tablet 0  . traZODone (DESYREL) 50 MG tablet Take 50 mg by mouth at bedtime.    Marland Kitchen zinc gluconate 50 MG tablet Take 50 mg by mouth daily.     No current facility-administered medications for this visit.    SURGICAL HISTORY:  Past Surgical History  Procedure Laterality Date  . Total abdominal hysterectomy    . Shoulder arthroscopy    . Video bronchoscopy Bilateral 07/25/2012    Procedure: VIDEO BRONCHOSCOPY WITH FLUORO;  Surgeon: Tanda Rockers, MD;  Location: [facility];  Service: Endoscopy;  Laterality: Bilateral;  . Lump removed from breasr right  2003  . Endobronchial ultrasound Bilateral 09/04/2012    Procedure: ENDOBRONCHIAL ULTRASOUND;  Surgeon: Collene Gobble, MD;  Location: [facility];  Service: Cardiopulmonary;   Laterality: Bilateral;  . Breast surgery    . Subxyphoid pericardial window N/A 01/04/2014    Procedure: SUBXYPHOID PERICARDIAL WINDOW;  Surgeon: [name], MD;  Location: [facility];  Service: Thoracic;  Laterality: N/A;  . Intraoperative transesophageal echocardiogram N/A 01/04/2014    Procedure: INTRAOPERATIVE TRANSESOPHAGEAL ECHOCARDIOGRAM;  Surgeon: [name], MD;  Location: [facility];  Service: Open Heart Surgery;  Laterality: N/A;    REVIEW OF SYSTEMS:  Constitutional: positive for fatigue Eyes: negative Ears, nose, mouth, throat, and face: negative Respiratory: positive for cough, dyspnea on exertion, hemoptysis and wheezing Cardiovascular: negative Gastrointestinal: negative Genitourinary:negative Integument/breast: negative Hematologic/lymphatic: negative Musculoskeletal:negative Neurological: negative Behavioral/Psych: negative Endocrine: negative Allergic/Immunologic: negative   PHYSICAL EXAMINATION: General appearance: alert, cooperative, fatigued and no distress Head: Normocephalic, without obvious abnormality, atraumatic Neck: no adenopathy, no JVD, supple, symmetrical, trachea midline and  thyroid not enlarged, symmetric, no tenderness/mass/nodules Lymph nodes: Cervical, supraclavicular, and axillary nodes normal. Resp: clear to auscultation bilaterally and normal percussion bilaterally Back: symmetric, no curvature. ROM normal. No CVA tenderness. Cardio: regular rate and rhythm, S1, S2 normal, no murmur, click, rub or gallop and normal apical impulse GI: soft, non-tender; bowel sounds normal; no masses,  no organomegaly Extremities: extremities normal, atraumatic, no cyanosis or edema Neurologic: Alert and oriented X 3, normal strength and tone. Normal symmetric reflexes. Normal coordination and gait  ECOG PERFORMANCE STATUS: 1 - Symptomatic but completely ambulatory  Blood pressure 135/57, pulse 109, temperature 98.8 F (37.1 C), temperature source  Oral, resp. rate 17, height $RemoveBe'5\' 3"'BUwLSJCah$  (1.6 m), weight 135 lb 8 oz (61.462 kg), SpO2 90 %, peak flow 2 L/min.  LABORATORY DATA: Lab Results  Component Value Date   WBC 4.4 09/12/2014   HGB 12.2 09/12/2014   HCT 36.6 09/12/2014   MCV 98.9 09/12/2014   PLT 586* 09/12/2014      Chemistry      Component Value Date/Time   NA 138 09/12/2014 1444   NA 140 01/07/2014 0233   K 4.0 09/12/2014 1444   K 4.4 01/07/2014 0233   CL 100 01/07/2014 0233   CL 104 11/13/2012 1122   CO2 26 09/12/2014 1444   CO2 30 01/07/2014 0233   BUN 6.6* 09/12/2014 1444   BUN 5* 01/07/2014 0233   CREATININE 0.6 09/12/2014 1444   CREATININE 0.54 01/07/2014 0233      Component Value Date/Time   CALCIUM 9.3 09/12/2014 1444   CALCIUM 8.9 01/07/2014 0233   ALKPHOS 110 09/12/2014 1444   ALKPHOS 83 01/06/2014 0311   AST 23 09/12/2014 1444   AST 20 01/06/2014 0311   ALT 21 09/12/2014 1444   ALT 15 01/06/2014 0311   BILITOT 0.29 09/12/2014 1444   BILITOT 0.3 01/06/2014 0311       RADIOGRAPHIC STUDIES: No results found.  ASSESSMENT AND PLAN: This is a very pleasant 59 years old white female with stage IIIA non-small cell lung cancer, adenocarcinoma with negative EGFR mutation and negative ALK gene translocation is status post concurrent chemoradiation with weekly carboplatin and paclitaxel followed by consolidation chemotherapy with 3 cycles of carboplatin and gemcitabine. The recent CT scan of the chest showed increase in the size of the right upper lobe lung mass as well as the subcarinal lymphadenopathy. A PET scan performed yesterday showed signs for recurrent tumor in the right hemithorax with a large mass within the right hilar region extending into the posterior mediastinum with hypermetabolic mediastinal and right supraclavicular lymph node metastasis. She status post 1 cycle of systemic chemotherapy with carboplatin for an AUC of 5 and Alimta 500 mg/m given every 3 weeks. Patient was reviewed with Dr. Alen Blew  in Dr. Worthy Flank absence She is afebrile and currently on antibiotics. Her counts are within treatable range and she will may proceed with cycle #2 of her systemic chemotherapy with carboplatin and Alimta as scheduled. She will continue with weekly labs and return in 3 weeks prior to the start of cycle #3. She was given a prescription for a shower chair. She will continue with her course of palliative radiotherapy under the care of Dr. Sondra Come.    The patient was advised to call immediately if she has any concerning symptoms in the interval.  The patient voices understanding of current disease status and treatment options and is in agreement with the current care plan.  All questions were answered. The patient  knows to call the clinic with any problems, questions or concerns. We can certainly see the patient much sooner if necessary.  Carlton Adam, PA-C 09/12/2014   Disclaimer: This note was dictated with voice recognition software. Similar sounding words can inadvertently be transcribed and may not be corrected upon review.

## 2014-09-12 NOTE — Significant Event (Signed)
Second troponin is 0.53, but patient denies any chest pain.  Got ASA in ED, on tele monitor, will continue to observe and check serial troponins, but in absence of chest pain, not really any indication for NTG, morphine, or heparin gtt at this point.  If continues to elevate may need cards eval.

## 2014-09-12 NOTE — Progress Notes (Signed)
CRITICAL VALUE ALERT  Critical value received:  Troponin 0.53  Date of notification:  09/12/2014  Time of notification:  2300  Critical value read back:Yes.    Nurse who received alert:  Carnella Guadalajara I  MD notified (1st page):  Sheran Luz  Time of first page:  2315  MD notified (2nd page):  Time of second page:  Responding MD:  Sheran Luz  Time MD responded:  2317  MD aware. New orders given. Will continue to monitor pt closely and carry out POC.

## 2014-09-12 NOTE — H&P (Signed)
[facility] History and Physical  Linda Donovan YSA:[id number] DOB: [birth year] DOA: 09/12/2014  Referring physician: EDP PCP: Pcp Not In System   Chief Complaint: SOB   HPI: Linda Donovan is a 59 y.o. female with h/o NSCLC, COPD on 2L home O2 at baseline.  Patient presents to ED with c/o 1 day history of SOB.  She has been having productive cough for past couple of days as well, though she has had cough for a prolonged (months) time frame due to lung cancer.  Last week she began radiation therapy for her NSCLC in her RUL.  Today at therapy she was noted to have productive cough and was sent in to the ED.  Review of Systems: Systems reviewed.  As above, otherwise negative  Past Medical History  Diagnosis Date  . COPD (chronic obstructive pulmonary disease)   . ADD (attention deficit disorder)   . Hx of radiation therapy 09/28/12- 11/17/12    RU lung mass, mediastinum chest, 63 gray 35 fx  . Shortness of breath   . Asthma   . Pneumonia   . Arthritis   . Non-small cell lung cancer 09/18/2012    RUL  . Asthma    Past Surgical History  Procedure Laterality Date  . Total abdominal hysterectomy    . Shoulder arthroscopy    . Video bronchoscopy Bilateral 07/25/2012    Procedure: VIDEO BRONCHOSCOPY WITH FLUORO;  Surgeon: Tanda Rockers, MD;  Location: [facility];  Service: Endoscopy;  Laterality: Bilateral;  . Lump removed from breasr right  2003  . Endobronchial ultrasound Bilateral 09/04/2012    Procedure: ENDOBRONCHIAL ULTRASOUND;  Surgeon: Collene Gobble, MD;  Location: [facility];  Service: Cardiopulmonary;  Laterality: Bilateral;  . Breast surgery    . Subxyphoid pericardial window N/A 01/04/2014    Procedure: SUBXYPHOID PERICARDIAL WINDOW;  Surgeon: [name], MD;  Location: [facility];  Service: Thoracic;  Laterality: N/A;  . Intraoperative transesophageal echocardiogram N/A 01/04/2014    Procedure: INTRAOPERATIVE TRANSESOPHAGEAL ECHOCARDIOGRAM;  Surgeon: [name], MD;  Location: [facility];  Service: Open Heart Surgery;  Laterality: N/A;   Social History:  reports that she quit smoking about 18 years ago. Her smoking use included Cigarettes. She has a 20 pack-year smoking history. She has never used smokeless tobacco. She reports that she drinks about 0.6 oz of alcohol per week. She reports that she does not use illicit drugs.  Allergies  Allergen Reactions  . Clinoril [Sulindac] Hives  . Morphine Itching    Family History  Problem Relation Age of Onset  . COPD Paternal Uncle   . Heart disease Mother   . Heart disease Maternal Grandmother   . Cancer Paternal Grandmother     breast  . Prostate cancer Maternal Grandfather   . Cancer Maternal Grandfather     prostate     Prior to Admission medications   Medication Sig Start Date End Date Taking? Authorizing Provider  albuterol (PROVENTIL HFA;VENTOLIN HFA) 108 (90 BASE) MCG/ACT inhaler Inhale 2 puffs into the lungs every 6 (six) hours as needed for wheezing. 08/01/14  Yes Tammy S Parrett, NP  albuterol (PROVENTIL) (2.5 MG/3ML) 0.083% nebulizer solution Take 2.5 mg by nebulization every 6 (six) hours as needed for wheezing or shortness of breath.   Yes Historical Provider, MD  amphetamine-dextroamphetamine (ADDERALL) 20 MG tablet Take 20 mg by mouth daily before breakfast.    Yes Historical Provider, MD  aspirin 81 MG tablet Take 81 mg by  mouth daily.   Yes Historical Provider, MD  budesonide-formoterol (SYMBICORT) 160-4.5 MCG/ACT inhaler Inhale 2 puffs into the lungs 2 (two) times daily. 12/12/13  Yes Collene Gobble, MD  calcium-vitamin D (OSCAL WITH D) 250-125 MG-UNIT per tablet Take 1 tablet by mouth daily.   Yes Historical Provider, MD  chlorpheniramine-HYDROcodone (TUSSIONEX) 10-8 MG/5ML LQCR Take 5 mLs by mouth every 12 (twelve) hours as needed for cough. 09/10/14  Yes Gery Pray, MD  dexamethasone (DECADRON) 4 MG tablet 4 mg by mouth twice a day the day before, day of and day after the  chemotherapy every 3 weeks. 08/13/14  Yes Curt Bears, MD  folic acid (FOLVITE) 1 MG tablet Take 1 tablet (1 mg total) by mouth daily. 08/13/14  Yes Curt Bears, MD  magnesium oxide (MAG-OX) 400 MG tablet Take 1 tablet (400 mg total) by mouth daily. 03/12/13  Yes Brand Males, MD  metoprolol succinate (TOPROL-XL) 25 MG 24 hr tablet Take 1 tablet (25 mg total) by mouth daily. 01/09/14  Yes Brett Canales, PA-C  Multiple Vitamins-Minerals (MULTIVITAMIN WITH MINERALS) tablet Take 1 tablet by mouth daily.   Yes Historical Provider, MD  oxyCODONE-acetaminophen (PERCOCET/ROXICET) 5-325 MG per tablet Take 1 tablet by mouth every 4 (four) hours as needed for severe pain. 08/13/14  Yes Curt Bears, MD  Potassium 75 MG TABS Take 1 tablet by mouth daily.   Yes Historical Provider, MD  sucralfate (CARAFATE) 1 G tablet Take 1 tablet (1 g total) by mouth 4 (four) times daily -  with meals and at bedtime. Dissolve in 10 ml of water prior to taking 09/10/14  Yes Gery Pray, MD  traZODone (DESYREL) 50 MG tablet Take 50 mg by mouth at bedtime.   Yes Historical Provider, MD  zinc gluconate 50 MG tablet Take 50 mg by mouth daily.   Yes Historical Provider, MD  OXYGEN Inhale 2 L into the lungs continuous.    Historical Provider, MD  prochlorperazine (COMPAZINE) 10 MG tablet Take 1 tablet (10 mg total) by mouth every 6 (six) hours as needed for nausea or vomiting. 08/13/14   Curt Bears, MD   Physical Exam: Filed Vitals:   09/12/14 2010  BP: 99/73  Pulse: 106  Resp: 20    BP 99/73 mmHg  Pulse 106  Resp 20  SpO2 99%  General Appearance:    Alert, oriented, no distress, appears stated age  Head:    Normocephalic, atraumatic  Eyes:    PERRL, EOMI, sclera non-icteric        Nose:   Nares without drainage or epistaxis. Mucosa, turbinates normal  Throat:   Moist mucous membranes. Oropharynx without erythema or exudate.  Neck:   Supple. No carotid bruits.  No thyromegaly.  No lymphadenopathy.   Back:      No CVA tenderness, no spinal tenderness  Lungs:     Bilateral rales  Chest wall:    No tenderness to palpitation  Heart:    Regular rate and rhythm without murmurs, gallops, rubs  Abdomen:     Soft, non-tender, nondistended, normal bowel sounds, no organomegaly  Genitalia:    deferred  Rectal:    deferred  Extremities:   No clubbing, cyanosis or edema.  Pulses:   2+ and symmetric all extremities  Skin:   Skin color, texture, turgor normal, no rashes or lesions  Lymph nodes:   Cervical, supraclavicular, and axillary nodes normal  Neurologic:   CNII-XII intact. Normal strength, sensation and reflexes  throughout    Labs on Admission:  Basic Metabolic Panel:  Recent Labs Lab 09/06/14 1415 09/12/14 1444  NA 139 138  K 4.0 4.0  CO2 31* 26  GLUCOSE 171* 188*  BUN 5.6* 6.6*  CREATININE 0.6 0.6  CALCIUM 9.7 9.3   Liver Function Tests:  Recent Labs Lab 09/06/14 1415 09/12/14 1444  AST 19 23  ALT 25 21  ALKPHOS 112 110  BILITOT 0.34 0.29  PROT 6.8 7.2  ALBUMIN 2.8* 3.0*   No results for input(s): LIPASE, AMYLASE in the last 168 hours. No results for input(s): AMMONIA in the last 168 hours. CBC:  Recent Labs Lab 09/06/14 1413 09/12/14 1444 09/12/14 1815  WBC 8.7 4.4 4.9  NEUTROABS 7.3* 4.1 4.6  HGB 12.7 12.2 14.4  HCT 37.1 36.6 42.9  MCV 98.7 98.9 99.1  PLT 188 586* 511*   Cardiac Enzymes: No results for input(s): CKTOTAL, CKMB, CKMBINDEX, TROPONINI in the last 168 hours.  BNP (last 3 results)  Recent Labs  01/03/14 1338  PROBNP 149.8*   CBG: No results for input(s): GLUCAP in the last 168 hours.  Radiological Exams on Admission: Ct Angio Chest Pe W/cm &/or Wo Cm  09/12/2014   CLINICAL DATA:  History of recurrent lung cancer with increasing shortness of breath. Patient is currently being treated for potential pneumonia.  EXAM: CT ANGIOGRAPHY CHEST WITH CONTRAST  TECHNIQUE: Multidetector CT imaging of the chest was performed using the standard protocol  during bolus administration of intravenous contrast. Multiplanar CT image reconstructions and MIPs were obtained to evaluate the vascular anatomy.  CONTRAST:  34mL OMNIPAQUE IOHEXOL 350 MG/ML SOLN  COMPARISON:  July 17, 2014  FINDINGS: There is no pulmonary embolus. There is atherosclerosis of the aorta without aortic dissection or aneurysmal dilatation. The heart size is normal. There is no pericardial effusion. Mediastinal and subcarinal lymph nodes are unchanged.  Ill-defined masslike opacity is seen in the posterior right upper lung field with involvement of the right hilum unchanged compared to prior exam. There is interval developed patchy airspace opacity involving the right upper lobe, differential diagnosis includes interval developed pneumonia or radiation pneumonitis if there has been interval radiation since February 2016. There is interval increase of small to moderate right pleural effusion. The left lung is unchanged with emphysematous changes.  The visualized upper abdominal structures are unremarkable. There is a small hiatal hernia. No focal lytic or blastic lesion is identified within the visualized bones.  Review of the MIP images confirms the above findings.  IMPRESSION: Ill-defined masslike opacity identified in the right upper lung field with involvement of the right hilum consistent with patient's known recurrent lung cancer without changed compared to prior CT.  Interval developed patchy airspace opacity involving the right upper lobe, differential diagnosis includes interval foot bowel pneumonia or radiation pneumonitis if there has been interval radiation since February 2016.  Interval increase small to moderate right pleural effusion.   Electronically Signed   By: Abelardo Diesel M.D.   On: 09/12/2014 20:16   Dg Chest Port 1 View  09/12/2014   CLINICAL DATA:  Short of breath for 2 days. Chest congestion and cough for 1 month. Lung cancer with recurrence. COPD and asthma.  EXAM:  PORTABLE CHEST - 1 VIEW  COMPARISON:  08/01/2014 and PET of 08/12/2014.  FINDINGS: Numerous leads and wires project over the chest. Tracheal deviation to the right with mediastinal shift due to volume loss within the right hemithorax. Mild cardiomegaly. Trace right pleural fluid or  thickening, blunting the right costophrenic angle. No pneumothorax. Right apical pleural thickening. Right upper lobe masslike opacity is similar to slightly decreased. There is new right mid and lower lung interstitial and airspace disease, especially laterally. Clear left lung with interstitial thickening, lower lobe predominant.  IMPRESSION: Slight improvement in right upper lobe/ apical aeration. This was consistent with recurrent tumor on the 08/12/2014 PET. Development of interstitial and airspace disease in the right mid and lower lung. Considerations include radiation pneumonitis (if there is been interval radiation since 08/01/2014) or infection. Lymphangitic tumor spread is felt less likely, given time course.   Electronically Signed   By: Abigail Miyamoto M.D.   On: 09/12/2014 19:00    EKG: Independently reviewed.  Assessment/Plan Principal Problem:   Right upper lobe consolidation Active Problems:   COPD  GOLD III   Non-small cell lung cancer   Acute on chronic respiratory failure with hypoxia   1. RUL consolidation - CAP vs radiation pneumonitis, will treat as CAP empirically 1. Rocephin and azithromycin 2. Cultures pending 3. PNA pathway 4. Tele monitor for tachycardia 2. Acute on chronic respiratory failure with hypoxia - O2 requirements increased to 3L in ED to maintain sat of 90.  This occurs in setting of chronic COPD GOLD stage 3 and NSCLC 3. NSCLC - on radiotherapy, supposed to begin chemotherapy next week but this may have to be postponed in light of possible CAP.    Code Status: Full Code  Family Communication: Husband at bedside Disposition Plan: Admit to inpatient   Time spent: 70  min  Dondi Aime M. [facility] Pager [phone]  If 7AM-7PM, please contact the day team taking care of the patient [email or web address] Password [email or web address] 09/12/2014, 8:45 PM

## 2014-09-12 NOTE — ED Notes (Signed)
Bed: QQ59 Expected date:  Expected time:  Means of arrival:  Comments: Pt from [facility]- SOB

## 2014-09-12 NOTE — ED Notes (Addendum)
EDP made aware of patient i-stat troponin result.

## 2014-09-12 NOTE — ED Notes (Signed)
Delay lab draw, edp examing pt.

## 2014-09-12 NOTE — ED Provider Notes (Addendum)
CSN: [id number]     Arrival date & time 09/12/14  1654 History   First MD Initiated Contact with Patient 09/12/14 1739     Chief Complaint  Patient presents with  . Shortness of Breath     (Consider location/radiation/quality/duration/timing/severity/associated sxs/prior Treatment) The history is provided by the patient.  Linda Donovan is a 59 y.o. female hx of COPD, ADD, nonsmall cell lung cancer here presenting with shortness of breath. She has been chronically short of breath and has been oxygen. However she has worsening shortness of breath since yesterday. She has shortness of breath with minimal exertion and has been using more oxygen. Has been having nonproductive cough for the last several days as well. Went to radiation and was sent here for evaluation. No history of DVT or PEs in the past and not on blood thinners.   Past Medical History  Diagnosis Date  . COPD (chronic obstructive pulmonary disease)   . ADD (attention deficit disorder)   . Hx of radiation therapy 09/28/12- 11/17/12    RU lung mass, mediastinum chest, 63 gray 35 fx  . Shortness of breath   . Asthma   . Pneumonia   . Arthritis   . Non-small cell lung cancer 09/18/2012    RUL  . Asthma    Past Surgical History  Procedure Laterality Date  . Total abdominal hysterectomy    . Shoulder arthroscopy    . Video bronchoscopy Bilateral 07/25/2012    Procedure: VIDEO BRONCHOSCOPY WITH FLUORO;  Surgeon: Tanda Rockers, MD;  Location: [facility];  Service: Endoscopy;  Laterality: Bilateral;  . Lump removed from breasr right  2003  . Endobronchial ultrasound Bilateral 09/04/2012    Procedure: ENDOBRONCHIAL ULTRASOUND;  Surgeon: Collene Gobble, MD;  Location: [facility];  Service: Cardiopulmonary;  Laterality: Bilateral;  . Breast surgery    . Subxyphoid pericardial window N/A 01/04/2014    Procedure: SUBXYPHOID PERICARDIAL WINDOW;  Surgeon: [name], MD;  Location: [place];  Service: Thoracic;  Laterality:  N/A;  . Intraoperative transesophageal echocardiogram N/A 01/04/2014    Procedure: INTRAOPERATIVE TRANSESOPHAGEAL ECHOCARDIOGRAM;  Surgeon: [name], MD;  Location: [place];  Service: Open Heart Surgery;  Laterality: N/A;   Family History  Problem Relation Age of Onset  . COPD Paternal Uncle   . Heart disease Mother   . Heart disease Maternal Grandmother   . Cancer Paternal Grandmother     breast  . Prostate cancer Maternal Grandfather   . Cancer Maternal Grandfather     prostate   History  Substance Use Topics  . Smoking status: Former Smoker -- 1.00 packs/day for 20 years    Types: Cigarettes    Quit date: 06/07/1996  . Smokeless tobacco: Never Used  . Alcohol Use: 0.6 oz/week    1 Glasses of wine per week     Comment: one glass of wine "a few times per week"   OB History    No data available     Review of Systems  Respiratory: Positive for shortness of breath.   All other systems reviewed and are negative.     Allergies  Clinoril and Morphine  Home Medications   Prior to Admission medications   Medication Sig Start Date End Date Taking? Authorizing Provider  albuterol (PROVENTIL HFA;VENTOLIN HFA) 108 (90 BASE) MCG/ACT inhaler Inhale 2 puffs into the lungs every 6 (six) hours as needed for wheezing. 08/01/14  Yes Tammy S Parrett, NP  albuterol (PROVENTIL) (2.5 MG/3ML) 0.083%  nebulizer solution Take 2.5 mg by nebulization every 6 (six) hours as needed for wheezing or shortness of breath.   Yes Historical Provider, MD  amphetamine-dextroamphetamine (ADDERALL) 20 MG tablet Take 20 mg by mouth daily before breakfast.    Yes Historical Provider, MD  aspirin 81 MG tablet Take 81 mg by mouth daily.   Yes Historical Provider, MD  budesonide-formoterol (SYMBICORT) 160-4.5 MCG/ACT inhaler Inhale 2 puffs into the lungs 2 (two) times daily. 12/12/13  Yes Collene Gobble, MD  calcium-vitamin D (OSCAL WITH D) 250-125 MG-UNIT per tablet Take 1 tablet by mouth daily.   Yes  Historical Provider, MD  chlorpheniramine-HYDROcodone (TUSSIONEX) 10-8 MG/5ML LQCR Take 5 mLs by mouth every 12 (twelve) hours as needed for cough. 09/10/14  Yes Gery Pray, MD  dexamethasone (DECADRON) 4 MG tablet 4 mg by mouth twice a day the day before, day of and day after the chemotherapy every 3 weeks. 08/13/14  Yes Curt Bears, MD  folic acid (FOLVITE) 1 MG tablet Take 1 tablet (1 mg total) by mouth daily. 08/13/14  Yes Curt Bears, MD  levofloxacin (LEVAQUIN) 500 MG tablet Take 1 tablet (500 mg total) by mouth daily. 09/10/14  Yes Gery Pray, MD  magnesium oxide (MAG-OX) 400 MG tablet Take 1 tablet (400 mg total) by mouth daily. 03/12/13  Yes Brand Males, MD  metoprolol succinate (TOPROL-XL) 25 MG 24 hr tablet Take 1 tablet (25 mg total) by mouth daily. 01/09/14  Yes Brett Canales, PA-C  Multiple Vitamins-Minerals (MULTIVITAMIN WITH MINERALS) tablet Take 1 tablet by mouth daily.   Yes Historical Provider, MD  oxyCODONE-acetaminophen (PERCOCET/ROXICET) 5-325 MG per tablet Take 1 tablet by mouth every 4 (four) hours as needed for severe pain. 08/13/14  Yes Curt Bears, MD  Potassium 75 MG TABS Take 1 tablet by mouth daily.   Yes Historical Provider, MD  sucralfate (CARAFATE) 1 G tablet Take 1 tablet (1 g total) by mouth 4 (four) times daily -  with meals and at bedtime. Dissolve in 10 ml of water prior to taking 09/10/14  Yes Gery Pray, MD  traZODone (DESYREL) 50 MG tablet Take 50 mg by mouth at bedtime.   Yes Historical Provider, MD  zinc gluconate 50 MG tablet Take 50 mg by mouth daily.   Yes Historical Provider, MD  doxycycline (VIBRA-TABS) 100 MG tablet Take 1 tablet (100 mg total) by mouth 2 (two) times daily. Patient not taking: Reported on 09/12/2014 08/13/14   Curt Bears, MD  HYDROcodone-homatropine [facility]) 5-1.5 MG/5ML syrup Take 5 mLs by mouth every 6 (six) hours as needed for cough. Patient not taking: Reported on 09/12/2014 08/26/14   Curt Bears, MD  OXYGEN Inhale 2 L  into the lungs continuous.    Historical Provider, MD  prochlorperazine (COMPAZINE) 10 MG tablet Take 1 tablet (10 mg total) by mouth every 6 (six) hours as needed for nausea or vomiting. 08/13/14   Curt Bears, MD   BP 99/73 mmHg  Pulse 106  Resp 20  SpO2 99% Physical Exam  Constitutional: She is oriented to person, place, and time.  Tachypneic, short of breath   HENT:  Head: Normocephalic.  Mouth/Throat: Oropharynx is clear and moist.  Eyes: Conjunctivae are normal. Pupils are equal, round, and reactive to light.  Neck: Normal range of motion. Neck supple.  Cardiovascular: Regular rhythm and normal heart sounds.   Tachycardic   Pulmonary/Chest:  Tachypneic, + rhonchi R side, diffuse wheezing, mild retractions.   Abdominal: Soft. Bowel sounds are normal.  She exhibits no distension. There is no tenderness. There is no rebound.  Musculoskeletal: Normal range of motion. She exhibits no edema or tenderness.  Neurological: She is alert and oriented to person, place, and time. No cranial nerve deficit. Coordination normal.  Skin: Skin is warm and dry.  Psychiatric: She has a normal mood and affect. Her behavior is normal. Judgment and thought content normal.  Nursing note and vitals reviewed.   ED Course  Procedures (including critical care time) Labs Review Labs Reviewed  CBC WITH DIFFERENTIAL/PLATELET - Abnormal; Notable for the following:    Platelets 511 (*)    Neutrophils Relative % 95 (*)    Lymphocytes Relative 4 (*)    Lymphs Abs 0.2 (*)    Monocytes Relative 1 (*)    All other components within normal limits  I-STAT TROPOININ, ED - Abnormal; Notable for the following:    Troponin i, poc 0.15 (*)    All other components within normal limits  CULTURE, BLOOD (ROUTINE X 2)  CULTURE, BLOOD (ROUTINE X 2)  INFLUENZA PANEL BY PCR (TYPE A & B, H1N1)  I-STAT CG4 LACTIC ACID, ED    Imaging Review Ct Angio Chest Pe W/cm &/or Wo Cm  09/12/2014   CLINICAL DATA:  History of  recurrent lung cancer with increasing shortness of breath. Patient is currently being treated for potential pneumonia.  EXAM: CT ANGIOGRAPHY CHEST WITH CONTRAST  TECHNIQUE: Multidetector CT imaging of the chest was performed using the standard protocol during bolus administration of intravenous contrast. Multiplanar CT image reconstructions and MIPs were obtained to evaluate the vascular anatomy.  CONTRAST:  14mL OMNIPAQUE IOHEXOL 350 MG/ML SOLN  COMPARISON:  July 17, 2014  FINDINGS: There is no pulmonary embolus. There is atherosclerosis of the aorta without aortic dissection or aneurysmal dilatation. The heart size is normal. There is no pericardial effusion. Mediastinal and subcarinal lymph nodes are unchanged.  Ill-defined masslike opacity is seen in the posterior right upper lung field with involvement of the right hilum unchanged compared to prior exam. There is interval developed patchy airspace opacity involving the right upper lobe, differential diagnosis includes interval developed pneumonia or radiation pneumonitis if there has been interval radiation since February 2016. There is interval increase of small to moderate right pleural effusion. The left lung is unchanged with emphysematous changes.  The visualized upper abdominal structures are unremarkable. There is a small hiatal hernia. No focal lytic or blastic lesion is identified within the visualized bones.  Review of the MIP images confirms the above findings.  IMPRESSION: Ill-defined masslike opacity identified in the right upper lung field with involvement of the right hilum consistent with patient's known recurrent lung cancer without changed compared to prior CT.  Interval developed patchy airspace opacity involving the right upper lobe, differential diagnosis includes interval foot bowel pneumonia or radiation pneumonitis if there has been interval radiation since February 2016.  Interval increase small to moderate right pleural effusion.    Electronically Signed   By: Abelardo Diesel M.D.   On: 09/12/2014 20:16   Dg Chest Port 1 View  09/12/2014   CLINICAL DATA:  Short of breath for 2 days. Chest congestion and cough for 1 month. Lung cancer with recurrence. COPD and asthma.  EXAM: PORTABLE CHEST - 1 VIEW  COMPARISON:  08/01/2014 and PET of 08/12/2014.  FINDINGS: Numerous leads and wires project over the chest. Tracheal deviation to the right with mediastinal shift due to volume loss within the right hemithorax. Mild cardiomegaly. Trace  right pleural fluid or thickening, blunting the right costophrenic angle. No pneumothorax. Right apical pleural thickening. Right upper lobe masslike opacity is similar to slightly decreased. There is new right mid and lower lung interstitial and airspace disease, especially laterally. Clear left lung with interstitial thickening, lower lobe predominant.  IMPRESSION: Slight improvement in right upper lobe/ apical aeration. This was consistent with recurrent tumor on the 08/12/2014 PET. Development of interstitial and airspace disease in the right mid and lower lung. Considerations include radiation pneumonitis (if there is been interval radiation since 08/01/2014) or infection. Lymphangitic tumor spread is felt less likely, given time course.   Electronically Signed   By: Abigail Miyamoto M.D.   On: 09/12/2014 19:00     EKG Interpretation None       <ECG>  ED ECG REPORT   Date: 09/12/2014  Rate: 111  Rhythm: sinus tachycardia  QRS Axis: normal  Intervals: normal  ST/T Wave abnormalities: nonspecific ST changes  Conduction Disutrbances:none  Narrative Interpretation:   Old EKG Reviewed: none available  I have personally reviewed the EKG tracing and agree with the computerized printout as noted.   MDM   Final diagnoses:  Shortness of breath    Min NYOKA ALCOSER is a 59 y.o. female here with SOB, cough. Also hypoxic. Consider pneumonia vs PE vs flu. Will get labs, lactate, ct angio.   8:34 PM CT  showed possible pneumonitis vs pneumonia. Still tachy and BP borderline. Will give ceftriaxone/azithro empirically. Will send off flu. Will admit. Of note, has positive troponin. EKG unremarkable. Likely demand ischemia. Will give aspirin. Will hold heparin.      Wandra Arthurs, MD 09/12/14 2035  Wandra Arthurs, MD 09/12/14 2043

## 2014-09-12 NOTE — Progress Notes (Signed)
  Radiation Oncology         ([phone]) [phone] ________________________________  Name: Linda Donovan MRN: [id number]  Date: 09/12/2014  DOB: 1955-09-18  Weekly Radiation Therapy Management  Current Dose: 17.5 Gy     Planned Dose:  25 Gy  Narrative . . . . . . . . The patient presents for routine under treatment assessment.                                   The patient wished to be seen again today. She is having increasing problems with shortness of breath the past 24 hours. She denies any chills or fever at home.  She was started on Levaquin 2 days ago. Patient denies any significant hemoptysis this time or significant pain within the chest. She continues to have significant problems with constant coughing                                 Set-up films were reviewed.                                 The chart was checked. Physical Findings. . . The patient appears quite fatigued. O2 was increased from 2 L to 3 L which did help somewhat with her respiratory discomfort. Patient has a lot of upper airway noise. Her pulse is accelerated at approximately 110- 120 Impression . . . . . . . The patient is tolerating radiation. Plan . . . . . . . . . . . . Patient will be transported to the emergency room for further evaluation. She will likely need a chest CT scan to rule out pulmonary embolus or other issue.  ________________________________   Blair Promise, PhD, MD

## 2014-09-12 NOTE — ED Notes (Signed)
Pt, being sent by Radiational Oncology, c/o increasing SOB and productive cough.  Pt is normally on 2L [place] and increased to 3L [place] to get O2 Sat into 90s.  Hx of recurrent lung CA.  Pt is currently on Levaquin for potential PNA.

## 2014-09-12 NOTE — Telephone Encounter (Signed)
gave and printed appt sched and avs for pt for April and May....sed added tx.

## 2014-09-12 NOTE — ED Notes (Signed)
Patient A&O x4, respirations even and unlabored, reports minimal relief from breathing tx and medication, c/o non productive cough and congestion. Denies pain at this time.

## 2014-09-12 NOTE — Progress Notes (Signed)
Per Dr. Sondra Come, transport patient to ER.  Called report to the charge nurse - Altha Harm, RN.  Transported patient in a wheelchair accompanied by her husband to room 24.  Gave report to [place], RN in the ER.

## 2014-09-12 NOTE — ED Notes (Signed)
Per [facility], patient has had cough for a month-had radiation today-increased SOB and chest congestion-

## 2014-09-12 NOTE — Progress Notes (Addendum)
Patient reports feeling very short of breath.  She said it started last night.  She has a frequent, productive cough.  She is bent over in the tripod position.  She is on 3 L of oxygen with a suturation on 95%.  She normally uses 2 L.  BP elevated at 140/113, Hr 103.  Notified Dr. Sondra Come.

## 2014-09-13 ENCOUNTER — Encounter (HOSPITAL_COMMUNITY): Payer: Self-pay | Admitting: Cardiology

## 2014-09-13 ENCOUNTER — Telehealth: Payer: Self-pay | Admitting: Oncology

## 2014-09-13 ENCOUNTER — Ambulatory Visit: Payer: BLUE CROSS/BLUE SHIELD | Admitting: Emergency Medicine

## 2014-09-13 ENCOUNTER — Other Ambulatory Visit: Payer: BLUE CROSS/BLUE SHIELD

## 2014-09-13 ENCOUNTER — Ambulatory Visit: Payer: BLUE CROSS/BLUE SHIELD

## 2014-09-13 ENCOUNTER — Ambulatory Visit: Payer: BLUE CROSS/BLUE SHIELD | Admitting: Physician Assistant

## 2014-09-13 DIAGNOSIS — R0600 Dyspnea, unspecified: Secondary | ICD-10-CM | POA: Diagnosis not present

## 2014-09-13 DIAGNOSIS — R000 Tachycardia, unspecified: Secondary | ICD-10-CM

## 2014-09-13 DIAGNOSIS — R778 Other specified abnormalities of plasma proteins: Secondary | ICD-10-CM | POA: Diagnosis present

## 2014-09-13 DIAGNOSIS — J449 Chronic obstructive pulmonary disease, unspecified: Secondary | ICD-10-CM

## 2014-09-13 DIAGNOSIS — R7989 Other specified abnormal findings of blood chemistry: Secondary | ICD-10-CM

## 2014-09-13 LAB — TROPONIN I
Troponin I: 0.72 ng/mL (ref <0.031)
Troponin I: 0.56 ng/mL (ref <0.031)

## 2014-09-13 LAB — BASIC METABOLIC PANEL
ANION GAP: 8 (ref 5–15)
BUN: 6 mg/dL (ref 6–23)
CHLORIDE: 100 mmol/L (ref 96–112)
CO2: 29 mmol/L (ref 19–32)
Calcium: 8.5 mg/dL (ref 8.4–10.5)
Creatinine, Ser: 0.47 mg/dL — ABNORMAL LOW (ref 0.50–1.10)
GFR calc Af Amer: >90 mL/min (ref >90)
Glucose, Bld: 141 mg/dL — ABNORMAL HIGH (ref 70–99)
POTASSIUM: 3.9 mmol/L (ref 3.5–5.1)
Sodium: 137 mmol/L (ref 135–145)

## 2014-09-13 LAB — EXPECTORATED SPUTUM ASSESSMENT W REFEX TO RESP CULTURE

## 2014-09-13 LAB — LEGIONELLA ANTIGEN, URINE

## 2014-09-13 LAB — CBC
HCT: 31.2 % — ABNORMAL LOW (ref 36.0–46.0)
Hemoglobin: 10.5 g/dL — ABNORMAL LOW (ref 12.0–15.0)
MCH: 33.3 pg (ref 26.0–34.0)
MCHC: 33.7 g/dL (ref 30.0–36.0)
MCV: 99 fL (ref 78.0–100.0)
PLATELETS: 483 10*3/uL — AB (ref 150–400)
RBC: 3.15 MIL/uL — ABNORMAL LOW (ref 3.87–5.11)
RDW: 12.9 % (ref 11.5–15.5)
WBC: 7.6 10*3/uL (ref 4.0–10.5)

## 2014-09-13 LAB — INFLUENZA PANEL BY PCR (TYPE A & B)
H1N1FLUPCR: NOT DETECTED
Influenza A By PCR: NEGATIVE
Influenza B By PCR: NEGATIVE

## 2014-09-13 LAB — STREP PNEUMONIAE URINARY ANTIGEN: Strep Pneumo Urinary Antigen: NEGATIVE

## 2014-09-13 LAB — HIV ANTIBODY (ROUTINE TESTING W REFLEX): HIV Screen 4th Generation wRfx: NONREACTIVE

## 2014-09-13 MED ORDER — PERFLUTREN LIPID MICROSPHERE
1.0000 mL | INTRAVENOUS | Status: AC | PRN
  Administered 2014-09-13: 4 mL via INTRAVENOUS
  Filled 2014-09-13: qty 10

## 2014-09-13 MED ORDER — FUROSEMIDE 10 MG/ML IJ SOLN
20.0000 mg | Freq: Once | INTRAMUSCULAR | Status: DC
  Filled 2014-09-13: qty 2

## 2014-09-13 MED ORDER — CETYLPYRIDINIUM CHLORIDE 0.05 % MT LIQD
7.0000 mL | Freq: Two times a day (BID) | OROMUCOSAL | Status: DC
  Administered 2014-09-13 – 2014-09-20 (×15): 7 mL via OROMUCOSAL

## 2014-09-13 MED ORDER — METOPROLOL TARTRATE 25 MG PO TABS
12.5000 mg | ORAL_TABLET | Freq: Two times a day (BID) | ORAL | Status: DC
  Administered 2014-09-13 (×2): 12.5 mg via ORAL
  Filled 2014-09-13 (×3): qty 1

## 2014-09-13 MED ORDER — FUROSEMIDE 10 MG/ML IJ SOLN
20.0000 mg | Freq: Once | INTRAMUSCULAR | Status: AC
  Administered 2014-09-13: 20 mg via INTRAVENOUS
  Filled 2014-09-13: qty 2

## 2014-09-13 MED ORDER — SODIUM CHLORIDE 0.9 % IV BOLUS (SEPSIS)
1000.0000 mL | Freq: Once | INTRAVENOUS | Status: AC
  Administered 2014-09-13: 1000 mL via INTRAVENOUS

## 2014-09-13 MED FILL — Sucralfate Tab 1 GM: ORAL | Qty: 1 | Status: AC

## 2014-09-13 NOTE — Progress Notes (Signed)
Echo reviewed; technically difficult; probable severe LV dysfunction; suggest cardiac MRI or MUGA to further assess; ? chemotherapy related; ? Ischemia mediated; continue ASA and toprol; cannot add ACEI at this point due to borderline BP. Not clear she is candidate for aggressive cardiac eval given lung ca. Will need to follow to see how she improves from pneumonia. We will follow. Kirk Ruths

## 2014-09-13 NOTE — Telephone Encounter (Signed)
Cleon Dew, RN on [employer] for report on Adisyn.  Per Candi Leash is breathing and feeling better today.  Her heart rate has been elevated at 244 and her systolic bp is low in the 90's.    Per Dr. Isidore Moos, patient is OK to have treatment today.  Called Katie back.  Per Candi Leash is refusing treatment today because she does not feel well enough for radiation treatment today.  Notified Nicole, Rt on [place] [address].

## 2014-09-13 NOTE — Progress Notes (Signed)
[facility]                                                                              Patient Demographics  Linda Donovan, is a 59 y.o. female, DOB - Apr 09, 1956, PZW:[id number]  Admit date - 09/12/2014   Admitting Physician Etta Quill, DO  Outpatient Primary MD for the patient is Pcp Not In System  LOS - 1   Chief Complaint  Patient presents with  . Shortness of Breath       Brief HPI   Linda Donovan is a 59 y.o. female with h/o NSCLC, COPD on 2L home O2 at baseline. Patient presents to ED with c/o 1 day history of SOB. She has been having productive cough for past couple of days as well, though she has had cough for a prolonged (months) time frame due to lung cancer. Last week she began radiation therapy for her NSCLC in her RUL.On the day of admission, at therapy she was noted to have productive cough and was sent in to the ED.   Assessment & Plan    Principal Problem:   Right upper lobe consolidation with acute on chronic hypoxic respiratory failure; pneumonia versus radiation pneumonitis -  Currently on Zithromax and Rocephin, continue O2, DuoNeb's - Influenza panel negative, will discontinue Tamiflu - Urine strep antigen negative so far, blood cultures pending, sputum culture pending  Active Problems: COPD exacerbation, acute on chronic hypoxic respiratory failure - Continue duo nebs, no wheezing at present   Non-small cell lung cancer - On radiotherapy, supposed to begin chemotherapy next week  Elevated troponin: Likely due to #1 - 2-D echo ordered, no chest pain at the present, Troponin is not trending down, cardiology consulted, do not feel this is ACS and no further cardiac workup  Code Status: Full code   Family Communication: Discussed in detail with the patient, all imaging results, lab results explained to the patient and husband   Disposition Plan:   Time Spent in minutes   25  minutes  Procedures  none  Consults     Cardiology   DVT Prophylaxis  Lovenox   Medications  Scheduled Meds: . antiseptic oral rinse  7 mL Mouth Rinse BID  . aspirin  81 mg Oral Daily  . azithromycin  500 mg Oral Q24H  . budesonide-formoterol  2 puff Inhalation BID  . cefTRIAXone (ROCEPHIN)  IV  1 g Intravenous Q24H  . enoxaparin (LOVENOX) injection  40 mg Subcutaneous Q24H  . folic acid  1 mg Oral Daily  . ipratropium-albuterol  3 mL Nebulization Q4H WA  . magnesium oxide  400 mg Oral Daily  . metoprolol succinate  25 mg Oral Daily  . oseltamivir  75 mg Oral BID  . sucralfate  1 g Oral TID WC & HS  . traZODone  50 mg Oral QHS   Continuous Infusions: . sodium chloride 100 mL/hr at 09/12/14 2300   PRN Meds:.albuterol, chlorpheniramine-HYDROcodone, oxyCODONE-acetaminophen, perflutren lipid microspheres (DEFINITY) IV suspension   Antibiotics   Anti-infectives    Start     Dose/Rate Route Frequency Ordered Stop   09/13/14 2000  cefTRIAXone (  ROCEPHIN) 1 g in dextrose 5 % 50 mL IVPB     1 g 100 mL/hr over 30 Minutes Intravenous Every 24 hours 09/12/14 2045 09/20/14 1959   09/12/14 2200  oseltamivir (TAMIFLU) capsule 75 mg     75 mg Oral 2 times daily 09/12/14 2022 09/17/14 2159   09/12/14 2030  cefTRIAXone (ROCEPHIN) 1 g in dextrose 5 % 50 mL IVPB     1 g 100 mL/hr over 30 Minutes Intravenous  Once 09/12/14 2022 09/12/14 2153   09/12/14 2030  azithromycin (ZITHROMAX) 500 mg in dextrose 5 % 250 mL IVPB     500 mg 250 mL/hr over 60 Minutes Intravenous  Once 09/12/14 2022 09/13/14 0003   09/12/14 2000  azithromycin (ZITHROMAX) tablet 500 mg     500 mg Oral Every 24 hours 09/12/14 2045 09/19/14 1959        Subjective:   Linda Donovan was seen and examined today. Patient seen and examined, No current chest pain, has acute on chronic shortness of breath, coughing , no fevers or chills, nausea or vomiting  Objective:   Blood pressure 94/64, pulse 100, temperature 98.7 F (37.1 C), temperature source Oral, resp.  rate 16, height 5\' 3"  (1.6 m), weight 61 kg (134 lb 7.7 oz), SpO2 100 %.  Wt Readings from Last 3 Encounters:  09/12/14 61 kg (134 lb 7.7 oz)  09/12/14 61.462 kg (135 lb 8 oz)  08/13/14 61.508 kg (135 lb 9.6 oz)     Intake/Output Summary (Last 24 hours) at 09/13/14 1211 Last data filed at 09/13/14 1050  Gross per 24 hour  Intake   1170 ml  Output   1551 ml  Net   -381 ml    Exam  General: Alert and oriented x 3, NAD  HEENT:  PERRLA, EOMI, Anicteic Sclera, mucous membranes moist.   Neck: Supple, no JVD, no masses  CVS: S1 S2 auscultated, no rubs, murmurs or gallops. Regular rate and rhythm.  Respiratory: Coarse breath sounds throughout   Abdomen: Soft, nontender, nondistended, + bowel sounds  Ext: no cyanosis clubbing or edema  Neuro: AAOx3, Cr N's II- XII. Strength 5/5 upper and lower extremities bilaterally  Skin: No rashes  Psych: Normal affect and demeanor, alert and oriented x3    Data Review   Micro Results Recent Results (from the past 240 hour(s))  Culture, sputum-assessment     Status: None   Collection Time: 09/13/14  7:27 AM  Result Value Ref Range Status   Specimen Description SPUTUM  Final   Special Requests NONE  Final   Sputum evaluation   Final    THIS SPECIMEN IS ACCEPTABLE. RESPIRATORY CULTURE REPORT TO FOLLOW.   Report Status 09/13/2014 FINAL  Final    Radiology Reports Ct Angio Chest Pe W/cm &/or Wo Cm  09/12/2014   CLINICAL DATA:  History of recurrent lung cancer with increasing shortness of breath. Patient is currently being treated for potential pneumonia.  EXAM: CT ANGIOGRAPHY CHEST WITH CONTRAST  TECHNIQUE: Multidetector CT imaging of the chest was performed using the standard protocol during bolus administration of intravenous contrast. Multiplanar CT image reconstructions and MIPs were obtained to evaluate the vascular anatomy.  CONTRAST:  56mL OMNIPAQUE IOHEXOL 350 MG/ML SOLN  COMPARISON:  July 17, 2014  FINDINGS: There is no  pulmonary embolus. There is atherosclerosis of the aorta without aortic dissection or aneurysmal dilatation. The heart size is normal. There is no pericardial effusion. Mediastinal and subcarinal lymph nodes are unchanged.  Ill-defined  masslike opacity is seen in the posterior right upper lung field with involvement of the right hilum unchanged compared to prior exam. There is interval developed patchy airspace opacity involving the right upper lobe, differential diagnosis includes interval developed pneumonia or radiation pneumonitis if there has been interval radiation since February 2016. There is interval increase of small to moderate right pleural effusion. The left lung is unchanged with emphysematous changes.  The visualized upper abdominal structures are unremarkable. There is a small hiatal hernia. No focal lytic or blastic lesion is identified within the visualized bones.  Review of the MIP images confirms the above findings.  IMPRESSION: Ill-defined masslike opacity identified in the right upper lung field with involvement of the right hilum consistent with patient's known recurrent lung cancer without changed compared to prior CT.  Interval developed patchy airspace opacity involving the right upper lobe, differential diagnosis includes interval foot bowel pneumonia or radiation pneumonitis if there has been interval radiation since February 2016.  Interval increase small to moderate right pleural effusion.   Electronically Signed   By: Abelardo Diesel M.D.   On: 09/12/2014 20:16   Dg Chest Port 1 View  09/12/2014   CLINICAL DATA:  Short of breath for 2 days. Chest congestion and cough for 1 month. Lung cancer with recurrence. COPD and asthma.  EXAM: PORTABLE CHEST - 1 VIEW  COMPARISON:  08/01/2014 and PET of 08/12/2014.  FINDINGS: Numerous leads and wires project over the chest. Tracheal deviation to the right with mediastinal shift due to volume loss within the right hemithorax. Mild cardiomegaly.  Trace right pleural fluid or thickening, blunting the right costophrenic angle. No pneumothorax. Right apical pleural thickening. Right upper lobe masslike opacity is similar to slightly decreased. There is new right mid and lower lung interstitial and airspace disease, especially laterally. Clear left lung with interstitial thickening, lower lobe predominant.  IMPRESSION: Slight improvement in right upper lobe/ apical aeration. This was consistent with recurrent tumor on the 08/12/2014 PET. Development of interstitial and airspace disease in the right mid and lower lung. Considerations include radiation pneumonitis (if there is been interval radiation since 08/01/2014) or infection. Lymphangitic tumor spread is felt less likely, given time course.   Electronically Signed   By: Abigail Miyamoto M.D.   On: 09/12/2014 19:00    CBC  Recent Labs Lab 09/06/14 1413 09/12/14 1444 09/12/14 1815 09/13/14 0253  WBC 8.7 4.4 4.9 7.6  HGB 12.7 12.2 14.4 10.5*  HCT 37.1 36.6 42.9 31.2*  PLT 188 586* 511* 483*  MCV 98.7 98.9 99.1 99.0  MCH 33.8 32.9 33.3 33.3  MCHC 34.2 33.2 33.6 33.7  RDW 12.1 12.9 12.8 12.9  LYMPHSABS 0.7* 0.1* 0.2*  --   MONOABS 0.6 0.1 0.1  --   EOSABS 0.1 0.0 0.0  --   BASOSABS 0.0 0.0 0.0  --     Chemistries   Recent Labs Lab 09/06/14 1415 09/12/14 1444 09/13/14 0253  NA 139 138 137  K 4.0 4.0 3.9  CL  --   --  100  CO2 31* 26 29  GLUCOSE 171* 188* 141*  BUN 5.6* 6.6* 6  CREATININE 0.6 0.6 0.47*  CALCIUM 9.7 9.3 8.5  AST 19 23  --   ALT 25 21  --   ALKPHOS 112 110  --   BILITOT 0.34 0.29  --    ------------------------------------------------------------------------------------------------------------------ estimated creatinine clearance is 61.9 mL/min (by C-G formula based on Cr of 0.47). ------------------------------------------------------------------------------------------------------------------ No results for input(s): HGBA1C  in the last 72  hours. ------------------------------------------------------------------------------------------------------------------ No results for input(s): CHOL, HDL, LDLCALC, TRIG, CHOLHDL, LDLDIRECT in the last 72 hours. ------------------------------------------------------------------------------------------------------------------ No results for input(s): TSH, T4TOTAL, T3FREE, THYROIDAB in the last 72 hours.  Invalid input(s): FREET3 ------------------------------------------------------------------------------------------------------------------ No results for input(s): VITAMINB12, FOLATE, FERRITIN, TIBC, IRON, RETICCTPCT in the last 72 hours.  Coagulation profile No results for input(s): INR, PROTIME in the last 168 hours.  No results for input(s): DDIMER in the last 72 hours.  Cardiac Enzymes  Recent Labs Lab 09/12/14 2108 09/13/14 0233 09/13/14 0845  TROPONINI 0.53* 0.72* 0.56*   ------------------------------------------------------------------------------------------------------------------ Invalid input(s): POCBNP  No results for input(s): GLUCAP in the last 72 hours.   RAI,RIPUDEEP M.D. [facility] 09/13/2014, 12:11 PM  Pager: [phone]   Between 7am to 7pm - call Pager - [phone]  After 7pm go to [email or web address] - password [email or web address]  Call night coverage person covering after 7pm

## 2014-09-13 NOTE — Progress Notes (Signed)
MD made aware that pt HR is sustaining in the 120's, BP 90/62.  Per MD hold Metoprolol XL. And give I liter NS bolus.  Will continue to monitor closely.

## 2014-09-13 NOTE — Progress Notes (Addendum)
On auscultation new onset coarse crackles were heard bilaterally.  Pt also having increased work of breathing.  VSS.  MD made aware.  Will continue to monitor closely.   Respiratory paged for PRN breathing treatment.

## 2014-09-13 NOTE — Progress Notes (Addendum)
Echocardiogram 2D Echocardiogram with definity has been performed.  Linda Donovan 09/13/2014, 11:05 AM

## 2014-09-13 NOTE — Progress Notes (Signed)
Flu panel negative.  Droplet precautions discontinued.

## 2014-09-13 NOTE — Progress Notes (Signed)
CRITICAL VALUE ALERT  Critical value received:  Troponin 0.72  Date of notification:  09/13/2014  Time of notification:  0337  Critical value read back:Yes.    Nurse who received alert:  Carnella Guadalajara I  MD notified (1st page):  Sheran Luz  Time of first page:  [phone]  MD aware of elevated troponins

## 2014-09-13 NOTE — Progress Notes (Signed)
CRITICAL VALUE ALERT  Critical value received:  Troponin  Date of notification:  4/8  Time of notification:  0950  Critical value read back:Yes.    Nurse who received alert:  Wess Botts  MD notified (1st page):  Raid  Time of first page:  [phone]  MD notified (2nd page):  Time of second page:  Responding MD:  Tana Coast  Time MD responded:

## 2014-09-13 NOTE — Progress Notes (Signed)
Pt HR sustaining 120's-130's after oral lopressor given.   BP 97/62. MD aware.  Will continue to monitor.

## 2014-09-13 NOTE — Progress Notes (Signed)
RT gave pt flutter valve. Pt knows and understands how to use.

## 2014-09-13 NOTE — Consult Note (Signed)
Primary cardiologist: Dr Radford Pax  HPI: 59 yo female with PMH lung cancer, prior pericardial window for effusion for evaluation of dyspnea and elevated troponin. Patient is undergoing radiation therapy and chemotherapy for recurrent lung cancer. Following radiation yesterday she developed worsening dyspnea. There was no chest pain, fevers, chills but there was a nonproductive cough. Chest CT shows a mass in the right upper lobe with patchy airspace opacity in the right upper lobe as well. She is felt to have pneumonia. Note there was no pericardial effusion. Troponins were checked for unclear reasons. Minimally elevated. Cardiology asked to evaluate.  Medications Prior to Admission  Medication Sig Dispense Refill  . albuterol (PROVENTIL HFA;VENTOLIN HFA) 108 (90 BASE) MCG/ACT inhaler Inhale 2 puffs into the lungs every 6 (six) hours as needed for wheezing. 8.5 g 5  . albuterol (PROVENTIL) (2.5 MG/3ML) 0.083% nebulizer solution Take 2.5 mg by nebulization every 6 (six) hours as needed for wheezing or shortness of breath.    . amphetamine-dextroamphetamine (ADDERALL) 20 MG tablet Take 20 mg by mouth daily before breakfast.     . aspirin 81 MG tablet Take 81 mg by mouth daily.    . budesonide-formoterol (SYMBICORT) 160-4.5 MCG/ACT inhaler Inhale 2 puffs into the lungs 2 (two) times daily. 1 Inhaler 0  . calcium-vitamin D (OSCAL WITH D) 250-125 MG-UNIT per tablet Take 1 tablet by mouth daily.    . chlorpheniramine-HYDROcodone (TUSSIONEX) 10-8 MG/5ML LQCR Take 5 mLs by mouth every 12 (twelve) hours as needed for cough. 115 mL 0  . dexamethasone (DECADRON) 4 MG tablet 4 mg by mouth twice a day the day before, day of and day after the chemotherapy every 3 weeks. 40 tablet 1  . folic acid (FOLVITE) 1 MG tablet Take 1 tablet (1 mg total) by mouth daily. 30 tablet 4  . magnesium oxide (MAG-OX) 400 MG tablet Take 1 tablet (400 mg total) by mouth daily. 60 tablet 2  . metoprolol succinate (TOPROL-XL) 25 MG  24 hr tablet Take 1 tablet (25 mg total) by mouth daily. 60 tablet 5  . Multiple Vitamins-Minerals (MULTIVITAMIN WITH MINERALS) tablet Take 1 tablet by mouth daily.    Marland Kitchen oxyCODONE-acetaminophen (PERCOCET/ROXICET) 5-325 MG per tablet Take 1 tablet by mouth every 4 (four) hours as needed for severe pain. 30 tablet 0  . Potassium 75 MG TABS Take 1 tablet by mouth daily.    . sucralfate (CARAFATE) 1 G tablet Take 1 tablet (1 g total) by mouth 4 (four) times daily -  with meals and at bedtime. Dissolve in 10 ml of water prior to taking 120 tablet 0  . traZODone (DESYREL) 50 MG tablet Take 50 mg by mouth at bedtime.    Marland Kitchen zinc gluconate 50 MG tablet Take 50 mg by mouth daily.    . OXYGEN Inhale 2 L into the lungs continuous.    . prochlorperazine (COMPAZINE) 10 MG tablet Take 1 tablet (10 mg total) by mouth every 6 (six) hours as needed for nausea or vomiting. 30 tablet 0    Allergies  Allergen Reactions  . Clinoril [Sulindac] Hives  . Morphine Itching     Past Medical History  Diagnosis Date  . COPD (chronic obstructive pulmonary disease)   . ADD (attention deficit disorder)   . Hx of radiation therapy 09/28/12- 11/17/12    RU lung mass, mediastinum chest, 63 gray 35 fx  . Pneumonia   . Arthritis   . Non-small cell lung cancer 09/18/2012    RUL  .  Asthma     Past Surgical History  Procedure Laterality Date  . Total abdominal hysterectomy    . Shoulder arthroscopy    . Video bronchoscopy Bilateral 07/25/2012    Procedure: VIDEO BRONCHOSCOPY WITH FLUORO;  Surgeon: Tanda Rockers, MD;  Location: [facility];  Service: Endoscopy;  Laterality: Bilateral;  . Lump removed from breasr right  2003  . Endobronchial ultrasound Bilateral 09/04/2012    Procedure: ENDOBRONCHIAL ULTRASOUND;  Surgeon: Collene Gobble, MD;  Location: [facility];  Service: Cardiopulmonary;  Laterality: Bilateral;  . Breast surgery    . Subxyphoid pericardial window N/A 01/04/2014    Procedure: SUBXYPHOID PERICARDIAL  WINDOW;  Surgeon: [name], MD;  Location: [facility];  Service: Thoracic;  Laterality: N/A;  . Intraoperative transesophageal echocardiogram N/A 01/04/2014    Procedure: INTRAOPERATIVE TRANSESOPHAGEAL ECHOCARDIOGRAM;  Surgeon: [name], MD;  Location: [facility];  Service: Open Heart Surgery;  Laterality: N/A;    History   Social History  . Marital Status: Married    Spouse Name: N/A  . Number of Children: N/A  . Years of Education: N/A   Occupational History  . Not on file.   Social History Main Topics  . Smoking status: Former Smoker -- 1.00 packs/day for 20 years    Types: Cigarettes    Quit date: 06/07/1996  . Smokeless tobacco: Never Used  . Alcohol Use: 0.6 oz/week    1 Glasses of wine per week     Comment: one glass of wine "a few times per week"  . Drug Use: No  . Sexual Activity: Not on file   Other Topics Concern  . Not on file   Social History Narrative    Family History  Problem Relation Age of Onset  . COPD Paternal Uncle   . Heart disease Mother   . Heart disease Maternal Grandmother   . Cancer Paternal Grandmother     breast  . Prostate cancer Maternal Grandfather   . Cancer Maternal Grandfather     prostate    ROS:  Dyspnea and nonproductive cough but no fevers or chills, hemoptysis, dysphasia, odynophagia, melena, hematochezia, dysuria, hematuria, rash, seizure activity, orthopnea, PND, pedal edema, claudication. Remaining systems are negative.  Physical Exam:   Blood pressure 90/62, pulse 120, temperature 98.7 F (37.1 C), temperature source Oral, resp. rate 16, height $RemoveBe'5\' 3"'KIodTgszm$  (1.6 m), weight 134 lb 7.7 oz (61 kg), SpO2 100 %.  General:  Well developed/well nourished in mild respiratory distess Skin warm/dry Patient not depressed No peripheral clubbing Back-normal HEENT-normal/normal eyelids Neck supple/normal carotid upstroke bilaterally; no bruits; no JVD; no thyromegaly chest - diffuse rhonchi CV - tachycardic/normal S1 and  S2; no murmurs, rubs or gallops;  PMI nondisplaced Abdomen -NT/ND, no HSM, no mass, + bowel sounds, no bruit 2+ femoral pulses, no bruits Ext-no edema, chords, 2+ DP Neuro-grossly nonfocal  ECG Sinus tach, prolonged QT  Results for orders placed or performed during the hospital encounter of 09/12/14 (from the past 48 hour(s))  CBC with Differential     Status: Abnormal   Collection Time: 09/12/14  6:15 PM  Result Value Ref Range   WBC 4.9 4.0 - 10.5 K/uL   RBC 4.33 3.87 - 5.11 MIL/uL   Hemoglobin 14.4 12.0 - 15.0 g/dL   HCT 42.9 36.0 - 46.0 %   MCV 99.1 78.0 - 100.0 fL   MCH 33.3 26.0 - 34.0 pg   MCHC 33.6 30.0 - 36.0 g/dL   RDW 12.8 11.5 -  15.5 %   Platelets 511 (H) 150 - 400 K/uL   Neutrophils Relative % 95 (H) 43 - 77 %   Neutro Abs 4.6 1.7 - 7.7 K/uL   Lymphocytes Relative 4 (L) 12 - 46 %   Lymphs Abs 0.2 (L) 0.7 - 4.0 K/uL   Monocytes Relative 1 (L) 3 - 12 %   Monocytes Absolute 0.1 0.1 - 1.0 K/uL   Eosinophils Relative 0 0 - 5 %   Eosinophils Absolute 0.0 0.0 - 0.7 K/uL   Basophils Relative 0 0 - 1 %   Basophils Absolute 0.0 0.0 - 0.1 K/uL  I-stat troponin, ED     Status: Abnormal   Collection Time: 09/12/14  6:26 PM  Result Value Ref Range   Troponin i, poc 0.15 (HH) 0.00 - 0.08 ng/mL   Comment NOTIFIED PHYSICIAN    Comment 3            Comment: Due to the release kinetics of cTnI, a negative result within the first hours of the onset of symptoms does not rule out myocardial infarction with certainty. If myocardial infarction is still suspected, repeat the test at appropriate intervals.   I-Stat CG4 Lactic Acid, ED     Status: None   Collection Time: 09/12/14  6:28 PM  Result Value Ref Range   Lactic Acid, Venous 1.52 0.5 - 2.0 mmol/L  Influenza panel by PCR (type A & B, H1N1)     Status: None   Collection Time: 09/12/14  8:23 PM  Result Value Ref Range   Influenza A By PCR NEGATIVE NEGATIVE   Influenza B By PCR NEGATIVE NEGATIVE   H1N1 flu by pcr NOT  DETECTED NOT DETECTED    Comment:        The Xpert Flu assay (FDA approved for nasal aspirates or washes and nasopharyngeal swab specimens), is intended as an aid in the diagnosis of influenza and should not be used as a sole basis for treatment. Performed at [facility]   Troponin I (q 6hr x 3)     Status: Abnormal   Collection Time: 09/12/14  9:08 PM  Result Value Ref Range   Troponin I 0.53 (HH) <0.031 ng/mL    Comment:        POSSIBLE MYOCARDIAL ISCHEMIA. SERIAL TESTING RECOMMENDED. CRITICAL RESULT CALLED TO, READ BACK BY AND VERIFIED WITH: J.ASARO,RN AT 2239 ON 09/12/14 BY W.SHEA   Strep pneumoniae urinary antigen     Status: None   Collection Time: 09/12/14 10:15 PM  Result Value Ref Range   Strep Pneumo Urinary Antigen NEGATIVE NEGATIVE    Comment:        Infection due to S. pneumoniae cannot be absolutely ruled out since the antigen present may be below the detection limit of the test. Performed at [facility]   Troponin I (q 6hr x 3)     Status: Abnormal   Collection Time: 09/13/14  2:33 AM  Result Value Ref Range   Troponin I 0.72 (HH) <0.031 ng/mL    Comment:        POSSIBLE MYOCARDIAL ISCHEMIA. SERIAL TESTING RECOMMENDED. CRITICAL RESULT CALLED TO, READ BACK BY AND VERIFIED WITH: J.ASARO,RN AT 0335 ON 09/13/14 BY W.SHEA   CBC     Status: Abnormal   Collection Time: 09/13/14  2:53 AM  Result Value Ref Range   WBC 7.6 4.0 - 10.5 K/uL   RBC 3.15 (L) 3.87 - 5.11 MIL/uL   Hemoglobin 10.5 (L) 12.0 -  15.0 g/dL    Comment: DELTA CHECK NOTED REPEATED TO VERIFY    HCT 31.2 (L) 36.0 - 46.0 %   MCV 99.0 78.0 - 100.0 fL   MCH 33.3 26.0 - 34.0 pg   MCHC 33.7 30.0 - 36.0 g/dL   RDW 12.9 11.5 - 15.5 %   Platelets 483 (H) 150 - 400 K/uL  Basic metabolic panel     Status: Abnormal   Collection Time: 09/13/14  2:53 AM  Result Value Ref Range   Sodium 137 135 - 145 mmol/L   Potassium 3.9 3.5 - 5.1 mmol/L   Chloride 100 96 - 112 mmol/L   CO2 29 19 -  32 mmol/L   Glucose, Bld 141 (H) 70 - 99 mg/dL   BUN 6 6 - 23 mg/dL   Creatinine, Ser 0.47 (L) 0.50 - 1.10 mg/dL   Calcium 8.5 8.4 - 10.5 mg/dL   GFR calc non Af Amer >90 >90 mL/min   GFR calc Af Amer >90 >90 mL/min    Comment: (NOTE) The eGFR has been calculated using the CKD EPI equation. This calculation has not been validated in all clinical situations. eGFR's persistently <90 mL/min signify possible Chronic Kidney Disease.    Anion gap 8 5 - 15  Culture, sputum-assessment     Status: None   Collection Time: 09/13/14  7:27 AM  Result Value Ref Range   Specimen Description SPUTUM    Special Requests NONE    Sputum evaluation      THIS SPECIMEN IS ACCEPTABLE. RESPIRATORY CULTURE REPORT TO FOLLOW.   Report Status 09/13/2014 FINAL   Troponin I (q 6hr x 3)     Status: Abnormal   Collection Time: 09/13/14  8:45 AM  Result Value Ref Range   Troponin I 0.56 (HH) <0.031 ng/mL    Comment:        POSSIBLE MYOCARDIAL ISCHEMIA. SERIAL TESTING RECOMMENDED. CRITICAL RESULT CALLED TO, READ BACK BY AND VERIFIED WITH: Jonni Sanger RN AT [phone] ON 04.08.16 BY G KONTOS     Ct Angio Chest Pe W/cm &/or Wo Cm  09/12/2014   CLINICAL DATA:  History of recurrent lung cancer with increasing shortness of breath. Patient is currently being treated for potential pneumonia.  EXAM: CT ANGIOGRAPHY CHEST WITH CONTRAST  TECHNIQUE: Multidetector CT imaging of the chest was performed using the standard protocol during bolus administration of intravenous contrast. Multiplanar CT image reconstructions and MIPs were obtained to evaluate the vascular anatomy.  CONTRAST:  33mL OMNIPAQUE IOHEXOL 350 MG/ML SOLN  COMPARISON:  July 17, 2014  FINDINGS: There is no pulmonary embolus. There is atherosclerosis of the aorta without aortic dissection or aneurysmal dilatation. The heart size is normal. There is no pericardial effusion. Mediastinal and subcarinal lymph nodes are unchanged.  Ill-defined masslike opacity is seen  in the posterior right upper lung field with involvement of the right hilum unchanged compared to prior exam. There is interval developed patchy airspace opacity involving the right upper lobe, differential diagnosis includes interval developed pneumonia or radiation pneumonitis if there has been interval radiation since February 2016. There is interval increase of small to moderate right pleural effusion. The left lung is unchanged with emphysematous changes.  The visualized upper abdominal structures are unremarkable. There is a small hiatal hernia. No focal lytic or blastic lesion is identified within the visualized bones.  Review of the MIP images confirms the above findings.  IMPRESSION: Ill-defined masslike opacity identified in the right upper lung field with involvement of  the right hilum consistent with patient's known recurrent lung cancer without changed compared to prior CT.  Interval developed patchy airspace opacity involving the right upper lobe, differential diagnosis includes interval foot bowel pneumonia or radiation pneumonitis if there has been interval radiation since February 2016.  Interval increase small to moderate right pleural effusion.   Electronically Signed   By: Abelardo Diesel M.D.   On: 09/12/2014 20:16   Dg Chest Port 1 View  09/12/2014   CLINICAL DATA:  Short of breath for 2 days. Chest congestion and cough for 1 month. Lung cancer with recurrence. COPD and asthma.  EXAM: PORTABLE CHEST - 1 VIEW  COMPARISON:  08/01/2014 and PET of 08/12/2014.  FINDINGS: Numerous leads and wires project over the chest. Tracheal deviation to the right with mediastinal shift due to volume loss within the right hemithorax. Mild cardiomegaly. Trace right pleural fluid or thickening, blunting the right costophrenic angle. No pneumothorax. Right apical pleural thickening. Right upper lobe masslike opacity is similar to slightly decreased. There is new right mid and lower lung interstitial and airspace  disease, especially laterally. Clear left lung with interstitial thickening, lower lobe predominant.  IMPRESSION: Slight improvement in right upper lobe/ apical aeration. This was consistent with recurrent tumor on the 08/12/2014 PET. Development of interstitial and airspace disease in the right mid and lower lung. Considerations include radiation pneumonitis (if there is been interval radiation since 08/01/2014) or infection. Lymphangitic tumor spread is felt less likely, given time course.   Electronically Signed   By: Abigail Miyamoto M.D.   On: 09/12/2014 19:00    Assessment/Plan 1 dyspnea-this is secondary to the patient's lung cancer and pneumonia. Her troponin is minimally elevated but there is no clear trend up and she is not having chest pain. No ST changes on electrocardiogram. I do not think further ischemia evaluation is indicated. Some of elevation may be related to ongoing pulmonary process. 2 elevated troponin-see #1 above. 3 pneumonia-agree with antibiotics. 4 lung cancer-ongoing chemotherapy and radiation. We will sign off. Please call with questions. Kirk Ruths MD 09/13/2014, 10:28 AM

## 2014-09-14 ENCOUNTER — Other Ambulatory Visit: Payer: Self-pay

## 2014-09-14 DIAGNOSIS — R0602 Shortness of breath: Secondary | ICD-10-CM

## 2014-09-14 LAB — BASIC METABOLIC PANEL
Anion gap: 7 (ref 5–15)
BUN: 6 mg/dL (ref 6–23)
CO2: 29 mmol/L (ref 19–32)
CREATININE: 0.47 mg/dL — AB (ref 0.50–1.10)
Calcium: 8.3 mg/dL — ABNORMAL LOW (ref 8.4–10.5)
Chloride: 98 mmol/L (ref 96–112)
GFR calc Af Amer: >90 mL/min (ref >90)
GFR calc non Af Amer: >90 mL/min (ref >90)
GLUCOSE: 92 mg/dL (ref 70–99)
POTASSIUM: 3.7 mmol/L (ref 3.5–5.1)
SODIUM: 134 mmol/L — AB (ref 135–145)

## 2014-09-14 LAB — CBC
HCT: 32 % — ABNORMAL LOW (ref 36.0–46.0)
HEMOGLOBIN: 10.6 g/dL — AB (ref 12.0–15.0)
MCH: 33.7 pg (ref 26.0–34.0)
MCHC: 33.1 g/dL (ref 30.0–36.0)
MCV: 101.6 fL — AB (ref 78.0–100.0)
Platelets: 560 10*3/uL — ABNORMAL HIGH (ref 150–400)
RBC: 3.15 MIL/uL — AB (ref 3.87–5.11)
RDW: 13.6 % (ref 11.5–15.5)
WBC: 6.7 10*3/uL (ref 4.0–10.5)

## 2014-09-14 LAB — MAGNESIUM: Magnesium: 1.5 mg/dL (ref 1.5–2.5)

## 2014-09-14 MED ORDER — METHYLPREDNISOLONE SODIUM SUCC 125 MG IJ SOLR: 60.0000 mg | Freq: Two times a day (BID) | INTRAMUSCULAR | Status: DC

## 2014-09-14 MED ORDER — METOPROLOL TARTRATE 25 MG PO TABS
12.5000 mg | ORAL_TABLET | Freq: Three times a day (TID) | ORAL | Status: DC
  Administered 2014-09-14 – 2014-09-15 (×3): 12.5 mg via ORAL
  Filled 2014-09-14 (×2): qty 1

## 2014-09-14 MED ORDER — LEVALBUTEROL HCL 0.63 MG/3ML IN NEBU: 0.6300 mg | INHALATION_SOLUTION | Freq: Four times a day (QID) | RESPIRATORY_TRACT | Status: DC

## 2014-09-14 MED ORDER — METHYLPREDNISOLONE SODIUM SUCC 125 MG IJ SOLR
60.0000 mg | Freq: Three times a day (TID) | INTRAMUSCULAR | Status: DC
Start: 2014-09-14 — End: 2014-09-14
  Administered 2014-09-14: 60 mg via INTRAVENOUS
  Filled 2014-09-14 (×2): qty 0.96

## 2014-09-14 MED ORDER — LEVALBUTEROL HCL 0.63 MG/3ML IN NEBU
0.6300 mg | INHALATION_SOLUTION | RESPIRATORY_TRACT | Status: DC
  Administered 2014-09-14 – 2014-09-17 (×21): 0.63 mg via RESPIRATORY_TRACT
  Filled 2014-09-14 (×23): qty 3

## 2014-09-14 MED ORDER — METHYLPREDNISOLONE SODIUM SUCC 125 MG IJ SOLR
60.0000 mg | Freq: Four times a day (QID) | INTRAMUSCULAR | Status: DC
  Administered 2014-09-14 – 2014-09-16 (×7): 60 mg via INTRAVENOUS
  Filled 2014-09-14 (×10): qty 0.96

## 2014-09-14 NOTE — Progress Notes (Signed)
DIAGNOSIS: Recurrent non-small cell lung cancer initially diagnosed as Stage IIIA non-small cell lung cancer, adenocarcinoma with negative EGFR mutation and negative ALK gene translocation diagnosed in March of 2014   PRIOR THERAPY:  1) Concurrent chemoradiation with weekly carboplatin for AUC of 2 and paclitaxel 45 mg/M2, status post 8 cycles, last dose was given on 11/13/2012 with partial response.  2) Consolidation chemotherapy with carboplatin for AUC of 5 on day 1 and gemcitabine 1000 mg/M2 on days 1 and 8 every 3 weeks, status post 3 cycles, last dose was given 02/20/2013 with mild improvement in her disease . First cycle was given on 01/02/2013.  3) status post subxiphoid pericardial window under the care of Dr. Roxan Hockey on 01/04/2014 and the final pathology showed no evidence for malignancy.  CURRENT THERAPY:  1) Systemic chemotherapy with carboplatin for AUC of 5 and Alimta 500 MG/M2 every 3 weeks. First dose 08/20/2014. 2) palliative radiotherapy to the enlarging right hilar mass under the care of Dr. Sondra Come  CHEMOTHERAPY INTENT: Control  CURRENT # OF CHEMOTHERAPY CYCLES: 1  CURRENT ANTIEMETICS: Zofran, dexamethasone and Compazine  CURRENT SMOKING STATUS: Former smoker, quit in [place]: None  CURRENT BISPHOSPHONATES USE: None  PAIN MANAGEMENT: 8/10, currently Percocet 5/325, 1-2 tabs q 4 hours  NARCOTICS INDUCED CONSTIPATION: None  LIVING WILL AND CODE STATUS: Full code Subjective: The patient is seen and examined today. Her husband was at the bedside. She was undergoing palliative radiotherapy to the right hilar mass and 2 days ago the patient complained of worsening dyspnea. She was admitted to [facility] and CT angiogram of the chest on 09/12/2014 showed no evidence for pulmonary embolism but there was ill-defined masslike opacity identified in the right upper lobe lung field with involvement of the right hilum consistent with the  patient is known recurrent lung cancer gout significant change from the prior CT scan of the chest. There was development of patchy airspace opacity involving the right upper lobe questionable for pneumonia or radiation pneumonitis and there was increase in the small to moderate right pleural effusion. The patient was started on treatment with antibiotics in the form of azithromycin and ceftriaxone. She is feeling a little bit better today. She continues to have shortness breath with exertion. She denied having any significant fever or chills. She has no nausea or vomiting.  Objective: Vital signs in last 24 hours: Temp:  [97.9 F (36.6 C)-98.1 F (36.7 C)] 98.1 F (36.7 C) (04/09 0617) Pulse Rate:  [100-131] 131 (04/09 0617) Resp:  [20] 20 (04/09 0617) BP: (94-135)/(62-86) 109/65 mmHg (04/09 0617) SpO2:  [95 %-100 %] 99 % (04/09 0736)  Intake/Output from previous day: 04/08 0701 - 04/09 0700 In: 120 [P.O.:120] Out: 1000 [Urine:1000] Intake/Output this shift:    General appearance: alert, cooperative, fatigued and no distress Resp: rales RUL and wheezes RUL Cardio: regular rate and rhythm, S1, S2 normal, no murmur, click, rub or gallop GI: soft, non-tender; bowel sounds normal; no masses,  no organomegaly Extremities: extremities normal, atraumatic, no cyanosis or edema  Lab Results:   Recent Labs  09/13/14 0253 09/14/14 0607  WBC 7.6 6.7  HGB 10.5* 10.6*  HCT 31.2* 32.0*  PLT 483* 560*   BMET  Recent Labs  09/13/14 0253 09/14/14 0607  NA 137 134*  K 3.9 3.7  CL 100 98  CO2 29 29  GLUCOSE 141* 92  BUN 6 6  CREATININE 0.47* 0.47*  CALCIUM 8.5 8.3*    Studies/Results:  Ct Angio Chest Pe W/cm &/or Wo Cm  09/12/2014   CLINICAL DATA:  History of recurrent lung cancer with increasing shortness of breath. Patient is currently being treated for potential pneumonia.  EXAM: CT ANGIOGRAPHY CHEST WITH CONTRAST  TECHNIQUE: Multidetector CT imaging of the chest was performed  using the standard protocol during bolus administration of intravenous contrast. Multiplanar CT image reconstructions and MIPs were obtained to evaluate the vascular anatomy.  CONTRAST:  29mL OMNIPAQUE IOHEXOL 350 MG/ML SOLN  COMPARISON:  July 17, 2014  FINDINGS: There is no pulmonary embolus. There is atherosclerosis of the aorta without aortic dissection or aneurysmal dilatation. The heart size is normal. There is no pericardial effusion. Mediastinal and subcarinal lymph nodes are unchanged.  Ill-defined masslike opacity is seen in the posterior right upper lung field with involvement of the right hilum unchanged compared to prior exam. There is interval developed patchy airspace opacity involving the right upper lobe, differential diagnosis includes interval developed pneumonia or radiation pneumonitis if there has been interval radiation since February 2016. There is interval increase of small to moderate right pleural effusion. The left lung is unchanged with emphysematous changes.  The visualized upper abdominal structures are unremarkable. There is a small hiatal hernia. No focal lytic or blastic lesion is identified within the visualized bones.  Review of the MIP images confirms the above findings.  IMPRESSION: Ill-defined masslike opacity identified in the right upper lung field with involvement of the right hilum consistent with patient's known recurrent lung cancer without changed compared to prior CT.  Interval developed patchy airspace opacity involving the right upper lobe, differential diagnosis includes interval foot bowel pneumonia or radiation pneumonitis if there has been interval radiation since February 2016.  Interval increase small to moderate right pleural effusion.   Electronically Signed   By: Abelardo Diesel M.D.   On: 09/12/2014 20:16   Dg Chest Port 1 View  09/12/2014   CLINICAL DATA:  Short of breath for 2 days. Chest congestion and cough for 1 month. Lung cancer with recurrence.  COPD and asthma.  EXAM: PORTABLE CHEST - 1 VIEW  COMPARISON:  08/01/2014 and PET of 08/12/2014.  FINDINGS: Numerous leads and wires project over the chest. Tracheal deviation to the right with mediastinal shift due to volume loss within the right hemithorax. Mild cardiomegaly. Trace right pleural fluid or thickening, blunting the right costophrenic angle. No pneumothorax. Right apical pleural thickening. Right upper lobe masslike opacity is similar to slightly decreased. There is new right mid and lower lung interstitial and airspace disease, especially laterally. Clear left lung with interstitial thickening, lower lobe predominant.  IMPRESSION: Slight improvement in right upper lobe/ apical aeration. This was consistent with recurrent tumor on the 08/12/2014 PET. Development of interstitial and airspace disease in the right mid and lower lung. Considerations include radiation pneumonitis (if there is been interval radiation since 08/01/2014) or infection. Lymphangitic tumor spread is felt less likely, given time course.   Electronically Signed   By: Abigail Miyamoto M.D.   On: 09/12/2014 19:00    Medications: I have reviewed the patient's current medications.   Assessment/Plan: 1) recurrent non-small cell lung cancer, adenocarcinoma: She is currently undergoing systemic chemotherapy with carboplatin and Alimta status post 1 cycle. The patient missed the start of cycle #2 last week secondary to her recent admission. We will reschedule her treatment after discharge. She is also undergoing palliative radiotherapy to the enlarging right hilar mass. She has 3 more fractions to complete this course. This  is currently on hold. 2) shortness of breath: Multifactorial including progressive lung cancer as well as questionable radiation induced pneumonitis and questionable pneumonia. She is currently covered with antibiotics in the form of azithromycin and ceftriaxone. I also recommended for the patient to start treatment  with high-dose steroids for the questionable radiation induced pneumonitis. Thank you for taking good care of Ms. Mcgaughy. I will continue to follow up the patient with you and assist in her management an as-needed basis.   LOS: 2 days    Odelle Kosier K. 09/14/2014

## 2014-09-14 NOTE — Progress Notes (Signed)
Patient Name: Linda Donovan Date of Encounter: 09/14/2014     Principal Problem:   Right upper lobe consolidation Active Problems:   COPD  GOLD III   Non-small cell lung cancer   Acute on chronic respiratory failure with hypoxia   Dyspnea   Elevated troponin    SUBJECTIVE  Patient denies any chest discomfort.  She is dyspneic at rest.  Her two-dimensional echocardiogram yesterday was a technically difficult study but suggested significant reduction in left ventricular systolic function since the prior study.  Ejection fraction is now in the 25-30% range, down from 55-60% in August 2015.  she has had lung cancer for 2 years.  She states that she started on her third round of chemotherapy 3 weeks ago   CURRENT MEDS . antiseptic oral rinse  7 mL Mouth Rinse BID  . aspirin  81 mg Oral Daily  . azithromycin  500 mg Oral Q24H  . budesonide-formoterol  2 puff Inhalation BID  . cefTRIAXone (ROCEPHIN)  IV  1 g Intravenous Q24H  . enoxaparin (LOVENOX) injection  40 mg Subcutaneous Q24H  . folic acid  1 mg Oral Daily  . furosemide  20 mg Intravenous Once  . ipratropium-albuterol  3 mL Nebulization Q4H WA  . magnesium oxide  400 mg Oral Daily  . metoprolol tartrate  12.5 mg Oral BID  . sucralfate  1 g Oral TID WC & HS  . traZODone  50 mg Oral QHS    OBJECTIVE  Filed Vitals:   09/13/14 1931 09/13/14 2130 09/14/14 0617 09/14/14 0736  BP:  121/76 109/65   Pulse:  130 131   Temp:  97.9 F (36.6 C) 98.1 F (36.7 C)   TempSrc:  Oral Oral   Resp:  20 20   Height:      Weight:      SpO2: 99% 99% 99% 99%    Intake/Output Summary (Last 24 hours) at 09/14/14 0841 Last data filed at 09/14/14 0618  Gross per 24 hour  Intake    120 ml  Output   1000 ml  Net   -880 ml   Filed Weights   09/12/14 2145  Weight: 134 lb 7.7 oz (61 kg)    PHYSICAL EXAM  General: Pleasant, NAD.  Appears chronically ill  Psych: Normal affect. HEENT:  Normal  Neck: Supple without bruits or  JVD. Lungs:  Resp regular and unlabored,  rhonchi and wheezes right chest  Heart: RRR no s3, s4, or murmurs. Abdomen: Soft, non-tender, non-distended, BS + x 4.  Extremities: No clubbing, cyanosis or edema. DP/PT/Radials 2+ and equal bilaterally.  Accessory Clinical Findings  CBC  Recent Labs  09/12/14 1444  09/12/14 1815 09/13/14 0253 09/14/14 0607  WBC 4.4  < > 4.9 7.6 6.7  NEUTROABS 4.1  --  4.6  --   --   HGB 12.2  < > 14.4 10.5* 10.6*  HCT 36.6  < > 42.9 31.2* 32.0*  MCV 98.9  < > 99.1 99.0 101.6*  PLT 586*  < > 511* 483* 560*  < > = values in this interval not displayed. Basic Metabolic Panel  Recent Labs  09/13/14 0253 09/14/14 0607  NA 137 134*  K 3.9 3.7  CL 100 98  CO2 29 29  GLUCOSE 141* 92  BUN 6 6  CREATININE 0.47* 0.47*  CALCIUM 8.5 8.3*   Liver Function Tests  Recent Labs  09/12/14 1444  AST 23  ALT 21  ALKPHOS 110  BILITOT  0.29  PROT 7.2  ALBUMIN 3.0*   No results for input(s): LIPASE, AMYLASE in the last 72 hours. Cardiac Enzymes  Recent Labs  09/12/14 2108 09/13/14 0233 09/13/14 0845  TROPONINI 0.53* 0.72* 0.56*   BNP Invalid input(s): POCBNP D-Dimer No results for input(s): DDIMER in the last 72 hours. Hemoglobin A1C No results for input(s): HGBA1C in the last 72 hours. Fasting Lipid Panel No results for input(s): CHOL, HDL, LDLCALC, TRIG, CHOLHDL, LDLDIRECT in the last 72 hours. Thyroid Function Tests No results for input(s): TSH, T4TOTAL, T3FREE, THYROIDAB in the last 72 hours.  Invalid input(s): FREET3  TELE  Sinus tachycardia  We will repeat EKG today Radiology/Studies  Ct Angio Chest Pe W/cm &/or Wo Cm  09/12/2014   CLINICAL DATA:  History of recurrent lung cancer with increasing shortness of breath. Patient is currently being treated for potential pneumonia.  EXAM: CT ANGIOGRAPHY CHEST WITH CONTRAST  TECHNIQUE: Multidetector CT imaging of the chest was performed using the standard protocol during bolus  administration of intravenous contrast. Multiplanar CT image reconstructions and MIPs were obtained to evaluate the vascular anatomy.  CONTRAST:  62mL OMNIPAQUE IOHEXOL 350 MG/ML SOLN  COMPARISON:  July 17, 2014  FINDINGS: There is no pulmonary embolus. There is atherosclerosis of the aorta without aortic dissection or aneurysmal dilatation. The heart size is normal. There is no pericardial effusion. Mediastinal and subcarinal lymph nodes are unchanged.  Ill-defined masslike opacity is seen in the posterior right upper lung field with involvement of the right hilum unchanged compared to prior exam. There is interval developed patchy airspace opacity involving the right upper lobe, differential diagnosis includes interval developed pneumonia or radiation pneumonitis if there has been interval radiation since February 2016. There is interval increase of small to moderate right pleural effusion. The left lung is unchanged with emphysematous changes.  The visualized upper abdominal structures are unremarkable. There is a small hiatal hernia. No focal lytic or blastic lesion is identified within the visualized bones.  Review of the MIP images confirms the above findings.  IMPRESSION: Ill-defined masslike opacity identified in the right upper lung field with involvement of the right hilum consistent with patient's known recurrent lung cancer without changed compared to prior CT.  Interval developed patchy airspace opacity involving the right upper lobe, differential diagnosis includes interval foot bowel pneumonia or radiation pneumonitis if there has been interval radiation since February 2016.  Interval increase small to moderate right pleural effusion.   Electronically Signed   By: Abelardo Diesel M.D.   On: 09/12/2014 20:16   Dg Chest Port 1 View  09/12/2014   CLINICAL DATA:  Short of breath for 2 days. Chest congestion and cough for 1 month. Lung cancer with recurrence. COPD and asthma.  EXAM: PORTABLE CHEST - 1  VIEW  COMPARISON:  08/01/2014 and PET of 08/12/2014.  FINDINGS: Numerous leads and wires project over the chest. Tracheal deviation to the right with mediastinal shift due to volume loss within the right hemithorax. Mild cardiomegaly. Trace right pleural fluid or thickening, blunting the right costophrenic angle. No pneumothorax. Right apical pleural thickening. Right upper lobe masslike opacity is similar to slightly decreased. There is new right mid and lower lung interstitial and airspace disease, especially laterally. Clear left lung with interstitial thickening, lower lobe predominant.  IMPRESSION: Slight improvement in right upper lobe/ apical aeration. This was consistent with recurrent tumor on the 08/12/2014 PET. Development of interstitial and airspace disease in the right mid and lower lung. Considerations include  radiation pneumonitis (if there is been interval radiation since 08/01/2014) or infection. Lymphangitic tumor spread is felt less likely, given time course.   Electronically Signed   By: Abigail Miyamoto M.D.   On: 09/12/2014 19:00    ASSESSMENT AND PLAN  1.  Left ventricular systolic dysfunction, new since August 2015 . possibly secondary to chemotherapy  2.  Sinus tachycardia  3.  COPD and non-small cell lung cancer  4.  Mildly elevated troponin . doubt acute coronary syndrome   Plan: Continue beta blocker and aspirin.  We will discuss further noninvasive testing with colleagues.  Possible MUGA versus cardiac MRI to evaluate left ventricular systolic function better.  Signed, Darlin Coco MD

## 2014-09-14 NOTE — Progress Notes (Addendum)
[facility]                                                                              Patient Demographics  Linda Donovan, is a 59 y.o. female, DOB - 12/23/1955, KPT:[id number]  Admit date - 09/12/2014   Admitting Physician Etta Quill, DO  Outpatient Primary MD for the patient is Pcp Not In System  LOS - 2   Chief Complaint  Patient presents with  . Shortness of Breath       Brief HPI   Linda Donovan is a 59 y.o. female with h/o NSCLC, COPD on 2L home O2 at baseline. Patient presents to ED with c/o 1 day history of SOB. She has been having productive cough for past couple of days as well, though she has had cough for a prolonged (months) time frame due to lung cancer. Last week she began radiation therapy for her NSCLC in her RUL.On the day of admission, at therapy she was noted to have productive cough and was sent in to the ED.   Assessment & Plan    Principal Problem:   Right upper lobe consolidation with acute on chronic hypoxic respiratory failure; pneumonia versus radiation pneumonitis -  Currently on Zithromax and Rocephin, day #2, continue O2,  - DC albuterol labs due to tachycardia, placed on Xopenex nebs, continue Symbicort, added Solu-Medrol - Influenza panel negative, discontinued Tamiflu - Urine strep antigen negative so far, HIV negative, urine legionella antigen negative, blood cultures negative so far , sputum culture in process  Active Problems: COPD exacerbation, acute on chronic hypoxic respiratory failure - . Lungs quite wheezy and rhonchorous, placed on Xopenex nebs, Symbicort, added Solu-Medrol, continue IV Rocephin and a Zithromax   Non-small cell lung cancer - On radiotherapy, supposed to begin chemotherapy next week  Elevated troponin: Likely due to #1 with sinus tachycardia, new left ventricular systolic dysfunction/systolic CHF - 2-D echo showed EF 25-30%, pulmonary hypertension with PA pressure of 60 - Continue  aspirin, placed on a beta blocker by cardiology, planning further noninvasive testing, will follow  Prolonged QTC on EKG - Avoid QTC prolonging medicines, check magnesium level, keep potassium around 4, EKG in a.m.  Code Status: Full code   Family Communication: Discussed in detail with the patient, all imaging results, lab results explained to the patient and husband at the bedside   Disposition Plan: Not medically ready  Time Spent in minutes   25  minutes  Procedures  none  Consults   Cardiology   DVT Prophylaxis  Lovenox   Medications  Scheduled Meds: . antiseptic oral rinse  7 mL Mouth Rinse BID  . aspirin  81 mg Oral Daily  . azithromycin  500 mg Oral Q24H  . budesonide-formoterol  2 puff Inhalation BID  . cefTRIAXone (ROCEPHIN)  IV  1 g Intravenous Q24H  . enoxaparin (LOVENOX) injection  40 mg Subcutaneous Q24H  . folic acid  1 mg Oral Daily  . furosemide  20 mg Intravenous Once  . levalbuterol  0.63 mg Nebulization Q6H  . magnesium oxide  400 mg Oral Daily  . metoprolol tartrate  12.5  mg Oral 3 times per day  . sucralfate  1 g Oral TID WC & HS  . traZODone  50 mg Oral QHS   Continuous Infusions:   PRN Meds:.albuterol, chlorpheniramine-HYDROcodone, oxyCODONE-acetaminophen   Antibiotics   Anti-infectives    Start     Dose/Rate Route Frequency Ordered Stop   09/13/14 2000  cefTRIAXone (ROCEPHIN) 1 g in dextrose 5 % 50 mL IVPB     1 g 100 mL/hr over 30 Minutes Intravenous Every 24 hours 09/12/14 2045 09/20/14 1959   09/12/14 2200  oseltamivir (TAMIFLU) capsule 75 mg  Status:  Discontinued     75 mg Oral 2 times daily 09/12/14 2022 09/13/14 1214   09/12/14 2030  cefTRIAXone (ROCEPHIN) 1 g in dextrose 5 % 50 mL IVPB     1 g 100 mL/hr over 30 Minutes Intravenous  Once 09/12/14 2022 09/12/14 2153   09/12/14 2030  azithromycin (ZITHROMAX) 500 mg in dextrose 5 % 250 mL IVPB     500 mg 250 mL/hr over 60 Minutes Intravenous  Once 09/12/14 2022 09/13/14 0003    09/12/14 2000  azithromycin (ZITHROMAX) tablet 500 mg     500 mg Oral Every 24 hours 09/12/14 2045 09/19/14 1959        Subjective:   Linda Donovan was seen and examined today. Patient seen and examined, feeling somewhat better from yesterday however lungs are still rhonchorous and wheezy. No fevers or chills. No chest pain, occasional coughing. No nausea, vomiting, abdominal pain, diarrhea or constipation, fevers or chills.   Objective:   Blood pressure 109/65, pulse 141, temperature 98.1 F (36.7 C), temperature source Oral, resp. rate 20, height 5\' 3"  (1.6 m), weight 61 kg (134 lb 7.7 oz), SpO2 99 %.  Wt Readings from Last 3 Encounters:  09/12/14 61 kg (134 lb 7.7 oz)  09/12/14 61.462 kg (135 lb 8 oz)  08/13/14 61.508 kg (135 lb 9.6 oz)     Intake/Output Summary (Last 24 hours) at 09/14/14 1018 Last data filed at 09/14/14 1007  Gross per 24 hour  Intake    120 ml  Output   1350 ml  Net  -1230 ml    Exam  General: Alert and oriented x 3, NAD, overall better from yesterday  HEENT:  PERRLA, EOMI, Anicteic Sclera, mucous membranes moist.   Neck: Supple, no JVD, no masses  CVS: Sinus tachycardia, regular rate and rhythm  Respiratory: Coarse breath sounds with expiratory wheezing throughout   Abdomen: Soft, NT, ND, NBS  Ext: no cyanosis clubbing or edema  Neuro: AAOx3, Cr N's II- XII. Strength 5/5 upper and lower extremities bilaterally  Skin: No rashes  Psych: Normal affect and demeanor, alert and oriented x3    Data Review   Micro Results Recent Results (from the past 240 hour(s))  Blood culture (routine x 2)     Status: None (Preliminary result)   Collection Time: 09/12/14  9:08 PM  Result Value Ref Range Status   Specimen Description BLOOD R FOREARM  Final   Special Requests BOTTLES DRAWN AEROBIC AND ANAEROBIC 5CC  Final   Culture   Final           BLOOD CULTURE RECEIVED NO GROWTH TO DATE CULTURE WILL BE HELD FOR 5 DAYS BEFORE ISSUING A FINAL NEGATIVE  REPORT Performed at [facility]    Report Status PENDING  Incomplete  Blood culture (routine x 2)     Status: None (Preliminary result)   Collection Time: 09/12/14  9:15  PM  Result Value Ref Range Status   Specimen Description BLOOD RIGHT ARM  Final   Special Requests BOTTLES DRAWN AEROBIC AND ANAEROBIC 4CC  Final   Culture   Final           BLOOD CULTURE RECEIVED NO GROWTH TO DATE CULTURE WILL BE HELD FOR 5 DAYS BEFORE ISSUING A FINAL NEGATIVE REPORT Performed at [facility]    Report Status PENDING  Incomplete  Culture, sputum-assessment     Status: None   Collection Time: 09/13/14  7:27 AM  Result Value Ref Range Status   Specimen Description SPUTUM  Final   Special Requests NONE  Final   Sputum evaluation   Final    THIS SPECIMEN IS ACCEPTABLE. RESPIRATORY CULTURE REPORT TO FOLLOW.   Report Status 09/13/2014 FINAL  Final  Culture, respiratory (NON-Expectorated)     Status: None (Preliminary result)   Collection Time: 09/13/14  7:27 AM  Result Value Ref Range Status   Specimen Description SPUTUM  Final   Special Requests NONE  Final   Gram Stain   Final    RARE WBC PRESENT, PREDOMINANTLY PMN RARE SQUAMOUS EPITHELIAL CELLS PRESENT FEW GRAM POSITIVE COCCI IN PAIRS IN CHAINS IN CLUSTERS RARE GRAM POSITIVE RODS RARE GRAM NEGATIVE RODS    Culture PENDING  Incomplete   Report Status PENDING  Incomplete    Radiology Reports Ct Angio Chest Pe W/cm &/or Wo Cm  09/12/2014   CLINICAL DATA:  History of recurrent lung cancer with increasing shortness of breath. Patient is currently being treated for potential pneumonia.  EXAM: CT ANGIOGRAPHY CHEST WITH CONTRAST  TECHNIQUE: Multidetector CT imaging of the chest was performed using the standard protocol during bolus administration of intravenous contrast. Multiplanar CT image reconstructions and MIPs were obtained to evaluate the vascular anatomy.  CONTRAST:  24mL OMNIPAQUE IOHEXOL 350 MG/ML SOLN  COMPARISON:  July 17, 2014  FINDINGS: There is no pulmonary embolus. There is atherosclerosis of the aorta without aortic dissection or aneurysmal dilatation. The heart size is normal. There is no pericardial effusion. Mediastinal and subcarinal lymph nodes are unchanged.  Ill-defined masslike opacity is seen in the posterior right upper lung field with involvement of the right hilum unchanged compared to prior exam. There is interval developed patchy airspace opacity involving the right upper lobe, differential diagnosis includes interval developed pneumonia or radiation pneumonitis if there has been interval radiation since February 2016. There is interval increase of small to moderate right pleural effusion. The left lung is unchanged with emphysematous changes.  The visualized upper abdominal structures are unremarkable. There is a small hiatal hernia. No focal lytic or blastic lesion is identified within the visualized bones.  Review of the MIP images confirms the above findings.  IMPRESSION: Ill-defined masslike opacity identified in the right upper lung field with involvement of the right hilum consistent with patient's known recurrent lung cancer without changed compared to prior CT.  Interval developed patchy airspace opacity involving the right upper lobe, differential diagnosis includes interval foot bowel pneumonia or radiation pneumonitis if there has been interval radiation since February 2016.  Interval increase small to moderate right pleural effusion.   Electronically Signed   By: Abelardo Diesel M.D.   On: 09/12/2014 20:16   Dg Chest Port 1 View  09/12/2014   CLINICAL DATA:  Short of breath for 2 days. Chest congestion and cough for 1 month. Lung cancer with recurrence. COPD and asthma.  EXAM: PORTABLE CHEST - 1 VIEW  COMPARISON:  08/01/2014 and PET of 08/12/2014.  FINDINGS: Numerous leads and wires project over the chest. Tracheal deviation to the right with mediastinal shift due to volume loss within the right  hemithorax. Mild cardiomegaly. Trace right pleural fluid or thickening, blunting the right costophrenic angle. No pneumothorax. Right apical pleural thickening. Right upper lobe masslike opacity is similar to slightly decreased. There is new right mid and lower lung interstitial and airspace disease, especially laterally. Clear left lung with interstitial thickening, lower lobe predominant.  IMPRESSION: Slight improvement in right upper lobe/ apical aeration. This was consistent with recurrent tumor on the 08/12/2014 PET. Development of interstitial and airspace disease in the right mid and lower lung. Considerations include radiation pneumonitis (if there is been interval radiation since 08/01/2014) or infection. Lymphangitic tumor spread is felt less likely, given time course.   Electronically Signed   By: Abigail Miyamoto M.D.   On: 09/12/2014 19:00    CBC  Recent Labs Lab 09/12/14 1444 09/12/14 1815 09/13/14 0253 09/14/14 0607  WBC 4.4 4.9 7.6 6.7  HGB 12.2 14.4 10.5* 10.6*  HCT 36.6 42.9 31.2* 32.0*  PLT 586* 511* 483* 560*  MCV 98.9 99.1 99.0 101.6*  MCH 32.9 33.3 33.3 33.7  MCHC 33.2 33.6 33.7 33.1  RDW 12.9 12.8 12.9 13.6  LYMPHSABS 0.1* 0.2*  --   --   MONOABS 0.1 0.1  --   --   EOSABS 0.0 0.0  --   --   BASOSABS 0.0 0.0  --   --     Chemistries   Recent Labs Lab 09/12/14 1444 09/13/14 0253 09/14/14 0607  NA 138 137 134*  K 4.0 3.9 3.7  CL  --  100 98  CO2 26 29 29   GLUCOSE 188* 141* 92  BUN 6.6* 6 6  CREATININE 0.6 0.47* 0.47*  CALCIUM 9.3 8.5 8.3*  AST 23  --   --   ALT 21  --   --   ALKPHOS 110  --   --   BILITOT 0.29  --   --    ------------------------------------------------------------------------------------------------------------------ estimated creatinine clearance is 61.9 mL/min (by C-G formula based on Cr of 0.47). ------------------------------------------------------------------------------------------------------------------ No results for  input(s): HGBA1C in the last 72 hours. ------------------------------------------------------------------------------------------------------------------ No results for input(s): CHOL, HDL, LDLCALC, TRIG, CHOLHDL, LDLDIRECT in the last 72 hours. ------------------------------------------------------------------------------------------------------------------ No results for input(s): TSH, T4TOTAL, T3FREE, THYROIDAB in the last 72 hours.  Invalid input(s): FREET3 ------------------------------------------------------------------------------------------------------------------ No results for input(s): VITAMINB12, FOLATE, FERRITIN, TIBC, IRON, RETICCTPCT in the last 72 hours.  Coagulation profile No results for input(s): INR, PROTIME in the last 168 hours.  No results for input(s): DDIMER in the last 72 hours.  Cardiac Enzymes  Recent Labs Lab 09/12/14 2108 09/13/14 0233 09/13/14 0845  TROPONINI 0.53* 0.72* 0.56*   ------------------------------------------------------------------------------------------------------------------ Invalid input(s): POCBNP  No results for input(s): GLUCAP in the last 72 hours.   Linda Donovan M.D. [facility] 09/14/2014, 10:18 AM  Pager: [phone]   Between 7am to 7pm - call Pager - [phone]  After 7pm go to [email or web address] - password [email or web address]  Call night coverage person covering after 7pm

## 2014-09-14 NOTE — Progress Notes (Signed)
MD notified of EKG results. Will continue to monitor.  

## 2014-09-15 LAB — CBC
HCT: 35.3 % — ABNORMAL LOW (ref 36.0–46.0)
HEMOGLOBIN: 11.5 g/dL — AB (ref 12.0–15.0)
MCH: 33 pg (ref 26.0–34.0)
MCHC: 32.6 g/dL (ref 30.0–36.0)
MCV: 101.1 fL — ABNORMAL HIGH (ref 78.0–100.0)
Platelets: 595 10*3/uL — ABNORMAL HIGH (ref 150–400)
RBC: 3.49 MIL/uL — AB (ref 3.87–5.11)
RDW: 13.3 % (ref 11.5–15.5)
WBC: 6.1 10*3/uL (ref 4.0–10.5)

## 2014-09-15 LAB — BASIC METABOLIC PANEL
ANION GAP: 8 (ref 5–15)
BUN: 6 mg/dL (ref 6–23)
CALCIUM: 8.9 mg/dL (ref 8.4–10.5)
CO2: 33 mmol/L — ABNORMAL HIGH (ref 19–32)
CREATININE: 0.44 mg/dL — AB (ref 0.50–1.10)
Chloride: 98 mmol/L (ref 96–112)
GFR calc Af Amer: >90 mL/min (ref >90)
GFR calc non Af Amer: >90 mL/min (ref >90)
GLUCOSE: 128 mg/dL — AB (ref 70–99)
POTASSIUM: 4.2 mmol/L (ref 3.5–5.1)
SODIUM: 139 mmol/L (ref 135–145)

## 2014-09-15 LAB — CULTURE, RESPIRATORY: CULTURE: NORMAL

## 2014-09-15 MED ORDER — MAGNESIUM OXIDE 400 (241.3 MG) MG PO TABS
400.0000 mg | ORAL_TABLET | Freq: Two times a day (BID) | ORAL | Status: DC
  Administered 2014-09-15 – 2014-09-21 (×11): 400 mg via ORAL
  Filled 2014-09-15 (×13): qty 1

## 2014-09-15 MED ORDER — METOPROLOL TARTRATE 25 MG PO TABS
25.0000 mg | ORAL_TABLET | Freq: Two times a day (BID) | ORAL | Status: DC
  Administered 2014-09-15 – 2014-09-17 (×6): 25 mg via ORAL
  Filled 2014-09-15 (×8): qty 1

## 2014-09-15 NOTE — Progress Notes (Signed)
[facility]                                                                              Patient Demographics  Linda Donovan, is a 59 y.o. female, DOB - 1955/06/12, VXB:[id number]  Admit date - 09/12/2014   Admitting Physician Etta Quill, DO  Outpatient Primary MD for the patient is Pcp Not In System  LOS - 3   Chief Complaint  Patient presents with  . Shortness of Breath       Brief HPI   Linda Donovan is a 59 y.o. female with h/o NSCLC, COPD on 2L home O2 at baseline. Patient presents to ED with c/o 1 day history of SOB. She has been having productive cough for past couple of days as well, though she has had cough for a prolonged (months) time frame due to lung cancer. Last week she began radiation therapy for her NSCLC in her RUL.On the day of admission, at therapy she was noted to have productive cough and was sent in to the ED.   Assessment & Plan    Principal Problem:   Right upper lobe consolidation with acute on chronic hypoxic respiratory failure; pneumonia versus radiation pneumonitis -  Currently on Zithromax and Rocephin, day #3, continue O2,  - Continue Xopenex nebs, continue Symbicort, IV Solu-Medrol - Influenza panel negative, discontinued Tamiflu - Urine strep antigen negative so far, HIV negative, urine legionella antigen negative, blood cultures negative so far , sputum culture showed normal oropharyngeal flora   Active Problems: COPD exacerbation, acute on chronic hypoxic respiratory failure - . Lungs still quite rhonchorous, continue Xopenex nebs, Symbicort, Solu-Medrol, continue IV Rocephin, Zithromax   Non-small cell lung cancer - On radiotherapy, supposed to begin chemotherapy next week  Elevated troponin: Likely due to #1 with sinus tachycardia, new left ventricular systolic dysfunction/systolic CHF - 2-D echo showed EF 25-30%, pulmonary hypertension with PA pressure of 60 - Continue aspirin, placed on a beta blocker by  cardiology, discussed with Dr. Mare Ferrari this morning, recommending cardiac MRI tomorrow  Prolonged QTC on EKG - Avoid QTC prolonging medicines, magnesium level 1.5, increased to 400 mg twice a day  Code Status: Full code   Family Communication: Discussed in detail with the patient, all imaging results, lab results explained to the patient and husband at the bedside   Disposition Plan: Not medically ready  Time Spent in minutes   25  minutes  Procedures  none  Consults   Cardiology   DVT Prophylaxis  Lovenox   Medications  Scheduled Meds: . antiseptic oral rinse  7 mL Mouth Rinse BID  . aspirin  81 mg Oral Daily  . azithromycin  500 mg Oral Q24H  . budesonide-formoterol  2 puff Inhalation BID  . cefTRIAXone (ROCEPHIN)  IV  1 g Intravenous Q24H  . enoxaparin (LOVENOX) injection  40 mg Subcutaneous Q24H  . folic acid  1 mg Oral Daily  . furosemide  20 mg Intravenous Once  . levalbuterol  0.63 mg Nebulization Q4H  . magnesium oxide  400 mg Oral Daily  . methylPREDNISolone (SOLU-MEDROL) injection  60 mg Intravenous Q6H  .  metoprolol tartrate  25 mg Oral BID  . sucralfate  1 g Oral TID WC & HS  . traZODone  50 mg Oral QHS   Continuous Infusions:   PRN Meds:.albuterol, chlorpheniramine-HYDROcodone, oxyCODONE-acetaminophen   Antibiotics   Anti-infectives    Start     Dose/Rate Route Frequency Ordered Stop   09/13/14 2000  cefTRIAXone (ROCEPHIN) 1 g in dextrose 5 % 50 mL IVPB     1 g 100 mL/hr over 30 Minutes Intravenous Every 24 hours 09/12/14 2045 09/20/14 1959   09/12/14 2200  oseltamivir (TAMIFLU) capsule 75 mg  Status:  Discontinued     75 mg Oral 2 times daily 09/12/14 2022 09/13/14 1214   09/12/14 2030  cefTRIAXone (ROCEPHIN) 1 g in dextrose 5 % 50 mL IVPB     1 g 100 mL/hr over 30 Minutes Intravenous  Once 09/12/14 2022 09/12/14 2153   09/12/14 2030  azithromycin (ZITHROMAX) 500 mg in dextrose 5 % 250 mL IVPB     500 mg 250 mL/hr over 60 Minutes Intravenous   Once 09/12/14 2022 09/13/14 0003   09/12/14 2000  azithromycin (ZITHROMAX) tablet 500 mg     500 mg Oral Every 24 hours 09/12/14 2045 09/19/14 1959        Subjective:   Linda Donovan was seen and examined today. Feeling somewhat better today, hoping to get up and ambulate some. No fevers or chills, still coughing. No nausea or vomiting, abdominal pain diarrhea or constipation. No fevers or chills.   Objective:   Blood pressure 112/77, pulse 131, temperature 97.9 F (36.6 C), temperature source Oral, resp. rate 20, height 5\' 3"  (1.6 m), weight 61 kg (134 lb 7.7 oz), SpO2 96 %.  Wt Readings from Last 3 Encounters:  09/12/14 61 kg (134 lb 7.7 oz)  09/12/14 61.462 kg (135 lb 8 oz)  08/13/14 61.508 kg (135 lb 9.6 oz)     Intake/Output Summary (Last 24 hours) at 09/15/14 1009 Last data filed at 09/14/14 2016  Gross per 24 hour  Intake    240 ml  Output    600 ml  Net   -360 ml    Exam  General: Alert and oriented x 3, NAD  HEENT:  PERRLA, EOMI, Anicteic Sclera, mucous membranes moist.   Neck: Supple, no JVD, no masses  CVS: S1 and S2. Regular rate and rhythm, sinus tachycardia  Respiratory: Coarse breath sounds, upper lobes somewhat better from yesterday  Abdomen: Soft, NT, ND, NBS  Ext: no cyanosis clubbing or edema  Neuro: AAOx3, Cr N's II- XII. Strength 5/5 upper and lower extremities bilaterally  Skin: No rashes  Psych: Alert and oriented 3   Data Review   Micro Results Recent Results (from the past 240 hour(s))  Blood culture (routine x 2)     Status: None (Preliminary result)   Collection Time: 09/12/14  9:08 PM  Result Value Ref Range Status   Specimen Description BLOOD R FOREARM  Final   Special Requests BOTTLES DRAWN AEROBIC AND ANAEROBIC 5CC  Final   Culture   Final           BLOOD CULTURE RECEIVED NO GROWTH TO DATE CULTURE WILL BE HELD FOR 5 DAYS BEFORE ISSUING A FINAL NEGATIVE REPORT Performed at [facility]    Report Status PENDING   Incomplete  Blood culture (routine x 2)     Status: None (Preliminary result)   Collection Time: 09/12/14  9:15 PM  Result Value Ref Range Status  Specimen Description BLOOD RIGHT ARM  Final   Special Requests BOTTLES DRAWN AEROBIC AND ANAEROBIC 4CC  Final   Culture   Final           BLOOD CULTURE RECEIVED NO GROWTH TO DATE CULTURE WILL BE HELD FOR 5 DAYS BEFORE ISSUING A FINAL NEGATIVE REPORT Performed at [facility]    Report Status PENDING  Incomplete  Culture, sputum-assessment     Status: None   Collection Time: 09/13/14  7:27 AM  Result Value Ref Range Status   Specimen Description SPUTUM  Final   Special Requests NONE  Final   Sputum evaluation   Final    THIS SPECIMEN IS ACCEPTABLE. RESPIRATORY CULTURE REPORT TO FOLLOW.   Report Status 09/13/2014 FINAL  Final  Culture, respiratory (NON-Expectorated)     Status: None (Preliminary result)   Collection Time: 09/13/14  7:27 AM  Result Value Ref Range Status   Specimen Description SPUTUM  Final   Special Requests NONE  Final   Gram Stain   Final    RARE WBC PRESENT, PREDOMINANTLY PMN RARE SQUAMOUS EPITHELIAL CELLS PRESENT FEW GRAM POSITIVE COCCI IN PAIRS IN CHAINS IN CLUSTERS RARE GRAM POSITIVE RODS RARE GRAM NEGATIVE RODS    Culture   Final    NORMAL OROPHARYNGEAL FLORA Performed at [facility]    Report Status PENDING  Incomplete    Radiology Reports Ct Angio Chest Pe W/cm &/or Wo Cm  09/12/2014   CLINICAL DATA:  History of recurrent lung cancer with increasing shortness of breath. Patient is currently being treated for potential pneumonia.  EXAM: CT ANGIOGRAPHY CHEST WITH CONTRAST  TECHNIQUE: Multidetector CT imaging of the chest was performed using the standard protocol during bolus administration of intravenous contrast. Multiplanar CT image reconstructions and MIPs were obtained to evaluate the vascular anatomy.  CONTRAST:  82mL OMNIPAQUE IOHEXOL 350 MG/ML SOLN  COMPARISON:  July 17, 2014   FINDINGS: There is no pulmonary embolus. There is atherosclerosis of the aorta without aortic dissection or aneurysmal dilatation. The heart size is normal. There is no pericardial effusion. Mediastinal and subcarinal lymph nodes are unchanged.  Ill-defined masslike opacity is seen in the posterior right upper lung field with involvement of the right hilum unchanged compared to prior exam. There is interval developed patchy airspace opacity involving the right upper lobe, differential diagnosis includes interval developed pneumonia or radiation pneumonitis if there has been interval radiation since February 2016. There is interval increase of small to moderate right pleural effusion. The left lung is unchanged with emphysematous changes.  The visualized upper abdominal structures are unremarkable. There is a small hiatal hernia. No focal lytic or blastic lesion is identified within the visualized bones.  Review of the MIP images confirms the above findings.  IMPRESSION: Ill-defined masslike opacity identified in the right upper lung field with involvement of the right hilum consistent with patient's known recurrent lung cancer without changed compared to prior CT.  Interval developed patchy airspace opacity involving the right upper lobe, differential diagnosis includes interval foot bowel pneumonia or radiation pneumonitis if there has been interval radiation since February 2016.  Interval increase small to moderate right pleural effusion.   Electronically Signed   By: Abelardo Diesel M.D.   On: 09/12/2014 20:16   Dg Chest Port 1 View  09/12/2014   CLINICAL DATA:  Short of breath for 2 days. Chest congestion and cough for 1 month. Lung cancer with recurrence. COPD and asthma.  EXAM: PORTABLE CHEST -  1 VIEW  COMPARISON:  08/01/2014 and PET of 08/12/2014.  FINDINGS: Numerous leads and wires project over the chest. Tracheal deviation to the right with mediastinal shift due to volume loss within the right hemithorax.  Mild cardiomegaly. Trace right pleural fluid or thickening, blunting the right costophrenic angle. No pneumothorax. Right apical pleural thickening. Right upper lobe masslike opacity is similar to slightly decreased. There is new right mid and lower lung interstitial and airspace disease, especially laterally. Clear left lung with interstitial thickening, lower lobe predominant.  IMPRESSION: Slight improvement in right upper lobe/ apical aeration. This was consistent with recurrent tumor on the 08/12/2014 PET. Development of interstitial and airspace disease in the right mid and lower lung. Considerations include radiation pneumonitis (if there is been interval radiation since 08/01/2014) or infection. Lymphangitic tumor spread is felt less likely, given time course.   Electronically Signed   By: Abigail Miyamoto M.D.   On: 09/12/2014 19:00    CBC  Recent Labs Lab 09/12/14 1444 09/12/14 1815 09/13/14 0253 09/14/14 0607 09/15/14 0430  WBC 4.4 4.9 7.6 6.7 6.1  HGB 12.2 14.4 10.5* 10.6* 11.5*  HCT 36.6 42.9 31.2* 32.0* 35.3*  PLT 586* 511* 483* 560* 595*  MCV 98.9 99.1 99.0 101.6* 101.1*  MCH 32.9 33.3 33.3 33.7 33.0  MCHC 33.2 33.6 33.7 33.1 32.6  RDW 12.9 12.8 12.9 13.6 13.3  LYMPHSABS 0.1* 0.2*  --   --   --   MONOABS 0.1 0.1  --   --   --   EOSABS 0.0 0.0  --   --   --   BASOSABS 0.0 0.0  --   --   --     Chemistries   Recent Labs Lab 09/12/14 1444 09/13/14 0253 09/14/14 0540 09/14/14 0607 09/15/14 0430  NA 138 137  --  134* 139  K 4.0 3.9  --  3.7 4.2  CL  --  100  --  98 98  CO2 26 29  --  29 33*  GLUCOSE 188* 141*  --  92 128*  BUN 6.6* 6  --  6 6  CREATININE 0.6 0.47*  --  0.47* 0.44*  CALCIUM 9.3 8.5  --  8.3* 8.9  MG  --   --  1.5  --   --   AST 23  --   --   --   --   ALT 21  --   --   --   --   ALKPHOS 110  --   --   --   --   BILITOT 0.29  --   --   --   --     ------------------------------------------------------------------------------------------------------------------ estimated creatinine clearance is 61.9 mL/min (by C-G formula based on Cr of 0.44). ------------------------------------------------------------------------------------------------------------------ No results for input(s): HGBA1C in the last 72 hours. ------------------------------------------------------------------------------------------------------------------ No results for input(s): CHOL, HDL, LDLCALC, TRIG, CHOLHDL, LDLDIRECT in the last 72 hours. ------------------------------------------------------------------------------------------------------------------ No results for input(s): TSH, T4TOTAL, T3FREE, THYROIDAB in the last 72 hours.  Invalid input(s): FREET3 ------------------------------------------------------------------------------------------------------------------ No results for input(s): VITAMINB12, FOLATE, FERRITIN, TIBC, IRON, RETICCTPCT in the last 72 hours.  Coagulation profile No results for input(s): INR, PROTIME in the last 168 hours.  No results for input(s): DDIMER in the last 72 hours.  Cardiac Enzymes  Recent Labs Lab 09/12/14 2108 09/13/14 0233 09/13/14 0845  TROPONINI 0.53* 0.72* 0.56*   ------------------------------------------------------------------------------------------------------------------ Invalid input(s): POCBNP  No results for input(s): GLUCAP in the last 72 hours.   Kaytie Ratcliffe M.D. [facility] 09/15/2014, 10:09 AM  Pager: [phone]   Between 7am to 7pm - call Pager - [phone]  After 7pm go to [email or web address] - password [email or web address]  Call night coverage person covering after 7pm

## 2014-09-15 NOTE — Progress Notes (Signed)
Patient Name: Linda Donovan Date of Encounter: 09/15/2014     Principal Problem:   Right upper lobe consolidation Active Problems:   COPD  GOLD III   Non-small cell lung cancer   Acute on chronic respiratory failure with hypoxia   Dyspnea   Elevated troponin    SUBJECTIVE  Less dyspneic today.  She still has a cough.  She is starting to cough up some yellow sputum.  She is on antibiotics. Heart rate remains sinus tachycardia 128 bpm.  No chest pain  CURRENT MEDS . antiseptic oral rinse  7 mL Mouth Rinse BID  . aspirin  81 mg Oral Daily  . azithromycin  500 mg Oral Q24H  . budesonide-formoterol  2 puff Inhalation BID  . cefTRIAXone (ROCEPHIN)  IV  1 g Intravenous Q24H  . enoxaparin (LOVENOX) injection  40 mg Subcutaneous Q24H  . folic acid  1 mg Oral Daily  . furosemide  20 mg Intravenous Once  . levalbuterol  0.63 mg Nebulization Q4H  . magnesium oxide  400 mg Oral Daily  . methylPREDNISolone (SOLU-MEDROL) injection  60 mg Intravenous Q6H  . metoprolol tartrate  25 mg Oral BID  . sucralfate  1 g Oral TID WC & HS  . traZODone  50 mg Oral QHS    OBJECTIVE  Filed Vitals:   09/14/14 2352 09/15/14 0413 09/15/14 0519 09/15/14 0741  BP:   112/77   Pulse:   131   Temp:   97.9 F (36.6 C)   TempSrc:   Oral   Resp:   20   Height:      Weight:      SpO2: 91% 95% 97% 96%    Intake/Output Summary (Last 24 hours) at 09/15/14 0837 Last data filed at 09/14/14 2016  Gross per 24 hour  Intake    480 ml  Output    950 ml  Net   -470 ml   Filed Weights   09/12/14 2145  Weight: 134 lb 7.7 oz (61 kg)    PHYSICAL EXAM  General: Pleasant, NAD. Appears chronically ill  Psych: Normal affect. HEENT: Normal Neck: Supple without bruits or JVD. Lungs: Resp regular and unlabored, rhonchi and wheezes right chest, improved from yesterday  Heart: RRR no s3, s4, or murmurs. Abdomen: Soft, non-tender, non-distended, BS + x 4.  Extremities: No clubbing,  cyanosis or edema. DP/PT/Radials 2+ and equal bilaterally. Accessory Clinical Findings  CBC  Recent Labs  09/12/14 1444 09/12/14 1815  09/14/14 0607 09/15/14 0430  WBC 4.4 4.9  < > 6.7 6.1  NEUTROABS 4.1 4.6  --   --   --   HGB 12.2 14.4  < > 10.6* 11.5*  HCT 36.6 42.9  < > 32.0* 35.3*  MCV 98.9 99.1  < > 101.6* 101.1*  PLT 586* 511*  < > 560* 595*  < > = values in this interval not displayed. Basic Metabolic Panel  Recent Labs  09/14/14 0540 09/14/14 0607 09/15/14 0430  NA  --  134* 139  K  --  3.7 4.2  CL  --  98 98  CO2  --  29 33*  GLUCOSE  --  92 128*  BUN  --  6 6  CREATININE  --  0.47* 0.44*  CALCIUM  --  8.3* 8.9  MG 1.5  --   --    Liver Function Tests  Recent Labs  09/12/14 1444  AST 23  ALT 21  ALKPHOS 110  BILITOT  0.29  PROT 7.2  ALBUMIN 3.0*   No results for input(s): LIPASE, AMYLASE in the last 72 hours. Cardiac Enzymes  Recent Labs  09/12/14 2108 09/13/14 0233 09/13/14 0845  TROPONINI 0.53* 0.72* 0.56*   BNP Invalid input(s): POCBNP D-Dimer No results for input(s): DDIMER in the last 72 hours. Hemoglobin A1C No results for input(s): HGBA1C in the last 72 hours. Fasting Lipid Panel No results for input(s): CHOL, HDL, LDLCALC, TRIG, CHOLHDL, LDLDIRECT in the last 72 hours. Thyroid Function Tests No results for input(s): TSH, T4TOTAL, T3FREE, THYROIDAB in the last 72 hours.  Invalid input(s): FREET3  TELE  Sinus tachycardia 128 bpm  ECG  14-Sep-2014 09:21:17 [facility] ROUTINE RECORD  Critical Test Result: Long QTc Sinus tachycardia T wave abnormality, consider anterior ischemia Abnormal ECG Since last tracing rate faster Confirmed by KELLY MD, Marcello Moores ([id number]) on 09/14/2014 10:31:26 AM  Radiology/Studies  Ct Angio Chest Pe W/cm &/or Wo Cm  09/12/2014   CLINICAL DATA:  History of recurrent lung cancer with increasing shortness of breath. Patient is currently being treated for potential pneumonia.  EXAM:  CT ANGIOGRAPHY CHEST WITH CONTRAST  TECHNIQUE: Multidetector CT imaging of the chest was performed using the standard protocol during bolus administration of intravenous contrast. Multiplanar CT image reconstructions and MIPs were obtained to evaluate the vascular anatomy.  CONTRAST:  2mL OMNIPAQUE IOHEXOL 350 MG/ML SOLN  COMPARISON:  July 17, 2014  FINDINGS: There is no pulmonary embolus. There is atherosclerosis of the aorta without aortic dissection or aneurysmal dilatation. The heart size is normal. There is no pericardial effusion. Mediastinal and subcarinal lymph nodes are unchanged.  Ill-defined masslike opacity is seen in the posterior right upper lung field with involvement of the right hilum unchanged compared to prior exam. There is interval developed patchy airspace opacity involving the right upper lobe, differential diagnosis includes interval developed pneumonia or radiation pneumonitis if there has been interval radiation since February 2016. There is interval increase of small to moderate right pleural effusion. The left lung is unchanged with emphysematous changes.  The visualized upper abdominal structures are unremarkable. There is a small hiatal hernia. No focal lytic or blastic lesion is identified within the visualized bones.  Review of the MIP images confirms the above findings.  IMPRESSION: Ill-defined masslike opacity identified in the right upper lung field with involvement of the right hilum consistent with patient's known recurrent lung cancer without changed compared to prior CT.  Interval developed patchy airspace opacity involving the right upper lobe, differential diagnosis includes interval foot bowel pneumonia or radiation pneumonitis if there has been interval radiation since February 2016.  Interval increase small to moderate right pleural effusion.   Electronically Signed   By: Abelardo Diesel M.D.   On: 09/12/2014 20:16   Dg Chest Port 1 View  09/12/2014   CLINICAL DATA:   Short of breath for 2 days. Chest congestion and cough for 1 month. Lung cancer with recurrence. COPD and asthma.  EXAM: PORTABLE CHEST - 1 VIEW  COMPARISON:  08/01/2014 and PET of 08/12/2014.  FINDINGS: Numerous leads and wires project over the chest. Tracheal deviation to the right with mediastinal shift due to volume loss within the right hemithorax. Mild cardiomegaly. Trace right pleural fluid or thickening, blunting the right costophrenic angle. No pneumothorax. Right apical pleural thickening. Right upper lobe masslike opacity is similar to slightly decreased. There is new right mid and lower lung interstitial and airspace disease, especially laterally. Clear left lung with interstitial  thickening, lower lobe predominant.  IMPRESSION: Slight improvement in right upper lobe/ apical aeration. This was consistent with recurrent tumor on the 08/12/2014 PET. Development of interstitial and airspace disease in the right mid and lower lung. Considerations include radiation pneumonitis (if there is been interval radiation since 08/01/2014) or infection. Lymphangitic tumor spread is felt less likely, given time course.   Electronically Signed   By: Abigail Miyamoto M.D.   On: 09/12/2014 19:00    ASSESSMENT AND PLAN 1. Left ventricular systolic dysfunction, new since August 2015 . possibly secondary to chemotherapy  2. Sinus tachycardia  3. COPD and non-small cell lung cancer  4. Mildly elevated troponin . doubt acute coronary syndrome  5.  QT prolongation possibly secondary to Zithromax  Plan: Discussed with Dr. Ena Dawley.  We will arrange for a noncontrast cardiac MRI at [facility] tomorrow to evaluate left ventricular function further. We'll increase beta blocker to 25 mg twice a day.  Signed, Darlin Coco MD

## 2014-09-16 ENCOUNTER — Telehealth: Payer: Self-pay | Admitting: Oncology

## 2014-09-16 ENCOUNTER — Ambulatory Visit: Payer: BC Managed Care – PPO

## 2014-09-16 ENCOUNTER — Inpatient Hospital Stay (HOSPITAL_COMMUNITY): Payer: BLUE CROSS/BLUE SHIELD

## 2014-09-16 ENCOUNTER — Ambulatory Visit (HOSPITAL_COMMUNITY)
Admit: 2014-09-16 | Discharge: 2014-09-16 | Disposition: A | Discharge status: Home / Self Care | Payer: BLUE CROSS/BLUE SHIELD | Attending: Cardiology | Admitting: Cardiology

## 2014-09-16 ENCOUNTER — Ambulatory Visit: Payer: BLUE CROSS/BLUE SHIELD

## 2014-09-16 DIAGNOSIS — R0682 Tachypnea, not elsewhere classified: Secondary | ICD-10-CM | POA: Insufficient documentation

## 2014-09-16 DIAGNOSIS — I081 Rheumatic disorders of both mitral and tricuspid valves: Secondary | ICD-10-CM

## 2014-09-16 DIAGNOSIS — R000 Tachycardia, unspecified: Secondary | ICD-10-CM | POA: Insufficient documentation

## 2014-09-16 DIAGNOSIS — I288 Other diseases of pulmonary vessels: Secondary | ICD-10-CM

## 2014-09-16 DIAGNOSIS — I519 Heart disease, unspecified: Secondary | ICD-10-CM | POA: Diagnosis not present

## 2014-09-16 LAB — BASIC METABOLIC PANEL
Anion gap: 7 (ref 5–15)
BUN: 13 mg/dL (ref 6–23)
CALCIUM: 8.9 mg/dL (ref 8.4–10.5)
CO2: 36 mmol/L — ABNORMAL HIGH (ref 19–32)
CREATININE: 0.56 mg/dL (ref 0.50–1.10)
Chloride: 96 mmol/L (ref 96–112)
GLUCOSE: 139 mg/dL — AB (ref 70–99)
POTASSIUM: 4.1 mmol/L (ref 3.5–5.1)
Sodium: 139 mmol/L (ref 135–145)

## 2014-09-16 LAB — CBC
HCT: 37.2 % (ref 36.0–46.0)
HEMOGLOBIN: 12 g/dL (ref 12.0–15.0)
MCH: 32.9 pg (ref 26.0–34.0)
MCHC: 32.3 g/dL (ref 30.0–36.0)
MCV: 101.9 fL — ABNORMAL HIGH (ref 78.0–100.0)
PLATELETS: 651 10*3/uL — AB (ref 150–400)
RBC: 3.65 MIL/uL — ABNORMAL LOW (ref 3.87–5.11)
RDW: 13.5 % (ref 11.5–15.5)
WBC: 14.2 10*3/uL — ABNORMAL HIGH (ref 4.0–10.5)

## 2014-09-16 LAB — BRAIN NATRIURETIC PEPTIDE: B NATRIURETIC PEPTIDE 5: 824.7 pg/mL — AB (ref 0.0–100.0)

## 2014-09-16 MED ORDER — FUROSEMIDE 10 MG/ML IJ SOLN
40.0000 mg | Freq: Once | INTRAMUSCULAR | Status: AC
  Administered 2014-09-16: 40 mg via INTRAVENOUS
  Filled 2014-09-16: qty 4

## 2014-09-16 MED ORDER — METHYLPREDNISOLONE SODIUM SUCC 125 MG IJ SOLR
60.0000 mg | Freq: Three times a day (TID) | INTRAMUSCULAR | Status: DC
  Administered 2014-09-16 – 2014-09-17 (×3): 60 mg via INTRAVENOUS
  Filled 2014-09-16 (×4): qty 0.96

## 2014-09-16 MED ORDER — FUROSEMIDE 40 MG PO TABS
40.0000 mg | ORAL_TABLET | Freq: Every day | ORAL | Status: DC
  Administered 2014-09-17: 40 mg via ORAL
  Filled 2014-09-16 (×2): qty 1

## 2014-09-16 MED ORDER — DOCUSATE SODIUM 100 MG PO CAPS
100.0000 mg | ORAL_CAPSULE | Freq: Two times a day (BID) | ORAL | Status: DC
  Administered 2014-09-16 – 2014-09-21 (×6): 100 mg via ORAL
  Filled 2014-09-16 (×11): qty 1

## 2014-09-16 MED ORDER — POLYETHYLENE GLYCOL 3350 17 G PO PACK: 17.0000 g | PACK | Freq: Every day | ORAL | Status: DC | PRN

## 2014-09-16 NOTE — Telephone Encounter (Signed)
Lolita Lenz, RN on [place].  She asked Olivette if she would like Radiation Treatment today and Mariesa states that she does not want it today.  She is very tired from traveling to [facility] for an MRI this morning.  Notified Faith, RT on [place].

## 2014-09-16 NOTE — Care Management Note (Addendum)
    Page 1 of 2   09/20/2014     11:10:57 AM CARE MANAGEMENT NOTE 09/20/2014  Patient:  Linda Donovan, Linda Donovan   Account Number:  [id number]  Date Initiated:  09/13/2014  Documentation initiated by:  Sunday Spillers  Subjective/Objective Assessment:   59 yo female admitted with PNA, history of lung cancer. PTA lived at home with spouse.     Action/Plan:   Home when stable. Has home 02.   Anticipated DC Date:  09/22/2014   Anticipated DC Plan:  [place]  CM consult      PAC Choice  HOSPICE   Choice offered to / List presented to:  C-1 Patient        [place] arranged  HH-1 RN  [place] agency  [facility] [place]   Status of service:  Completed, signed off Medicare Important Message given?   (If response is "NO", the following Medicare IM given date fields will be blank) Date Medicare IM given:   Medicare IM given by:   Date Additional Medicare IM given:   Additional Medicare IM given by:    Discharge Disposition:  [place]  Per UR Regulation:  Reviewed for med. necessity/level of care/duration of stay  If discussed at [employer] of Stay Meetings, dates discussed:   09/19/2014    Comments:  4/15  1106 debbie Laylani Pudwill rn,bsn spoke w pt and gave her list for hospcie agencies. pt chose hospice and [facility] [place]. ref faxed and lisa [place] w hosp an [facility] [place] will see pt today.  09/17/14 Dessa Phi RN BSN NCM [phone] Transfer to [facility] for the Advanced Heart failure program-acute to acute.No anticipated d/c needs.  09/16/14 Dessa Phi RN BSN NCM [phone] PNA, COPD, CHF,sinus tach.iv abx x2, iv solumedrol, 02,nebs.D/C plan home.

## 2014-09-16 NOTE — Progress Notes (Signed)
[facility]                                                                              Patient Demographics  Linda Donovan, is a 59 y.o. female, DOB - 05-08-1956, GYJ:[id number]  Admit date - 09/12/2014   Admitting Physician Etta Quill, DO  Outpatient Primary MD for the patient is Pcp Not In System  LOS - 4   Chief Complaint  Patient presents with  . Shortness of Breath       Brief HPI   Linda Donovan is a 59 y.o. female with h/o NSCLC, COPD on 2L home O2 at baseline. Patient presents to ED with c/o 1 day history of SOB. She has been having productive cough for past couple of days as well, though she has had cough for a prolonged (months) time frame due to lung cancer. Last week she began radiation therapy for her NSCLC in her RUL.On the day of admission, at therapy she was noted to have productive cough and was sent in to the ED.   Assessment & Plan    Principal Problem:   Right upper lobe consolidation with acute on chronic hypoxic respiratory failure; pneumonia versus radiation pneumonitis- improving slowly -  Currently on Zithromax and Rocephin, day #3, continue O2,  Xopenex nebs, Symbicort, Solu-Medrol.  - Tapered Solu-Medrol to 60 mg every 8 hours.  - Influenza panel negative, discontinued Tamiflu - Urine strep antigen negative so far, HIV negative, urine legionella antigen negative, blood cultures negative so far , sputum culture in process  Active Problems: COPD exacerbation, acute on chronic hypoxic respiratory failure - Slowly improving, continue xopenex nebs, Symbicort, Solu-Medrol, continue IV Rocephin and Zithromax   Non-small cell lung cancer - On radiotherapy, supposed to begin chemotherapy next week  Elevated troponin: Likely due to #1 with sinus tachycardia, new left ventricular systolic dysfunction/systolic CHF - 2-D echo showed EF 25-30%, pulmonary hypertension with PA pressure of 60 - Continue aspirin, placed on a beta blocker  by cardiology - cardiac MRI planned today  Prolonged QTC on EKG - Avoid QTC prolonging medicines, check magnesium level, keep potassium around 4, EKG in a.m.  Code Status: Full code   Family Communication: Discussed in detail with the patient, all imaging results, lab results explained to the patient and husband at the bedside   Disposition Plan: Not medically ready  Time Spent in minutes   25  minutes  Procedures  none  Consults   Cardiology   DVT Prophylaxis  Lovenox   Medications  Scheduled Meds: . antiseptic oral rinse  7 mL Mouth Rinse BID  . aspirin  81 mg Oral Daily  . budesonide-formoterol  2 puff Inhalation BID  . cefTRIAXone (ROCEPHIN)  IV  1 g Intravenous Q24H  . enoxaparin (LOVENOX) injection  40 mg Subcutaneous Q24H  . folic acid  1 mg Oral Daily  . furosemide  20 mg Intravenous Once  . levalbuterol  0.63 mg Nebulization Q4H  . magnesium oxide  400 mg Oral BID  . methylPREDNISolone (SOLU-MEDROL) injection  60 mg Intravenous Q8H  . metoprolol tartrate  25 mg Oral BID  .  sucralfate  1 g Oral TID WC & HS  . traZODone  50 mg Oral QHS   Continuous Infusions:   PRN Meds:.albuterol, chlorpheniramine-HYDROcodone, oxyCODONE-acetaminophen   Antibiotics   Anti-infectives    Start     Dose/Rate Route Frequency Ordered Stop   09/13/14 2000  cefTRIAXone (ROCEPHIN) 1 g in dextrose 5 % 50 mL IVPB     1 g 100 mL/hr over 30 Minutes Intravenous Every 24 hours 09/12/14 2045 09/20/14 1959   09/12/14 2200  oseltamivir (TAMIFLU) capsule 75 mg  Status:  Discontinued     75 mg Oral 2 times daily 09/12/14 2022 09/13/14 1214   09/12/14 2030  cefTRIAXone (ROCEPHIN) 1 g in dextrose 5 % 50 mL IVPB     1 g 100 mL/hr over 30 Minutes Intravenous  Once 09/12/14 2022 09/12/14 2153   09/12/14 2030  azithromycin (ZITHROMAX) 500 mg in dextrose 5 % 250 mL IVPB     500 mg 250 mL/hr over 60 Minutes Intravenous  Once 09/12/14 2022 09/13/14 0003   09/12/14 2000  azithromycin (ZITHROMAX)  tablet 500 mg  Status:  Discontinued     500 mg Oral Every 24 hours 09/12/14 2045 09/15/14 1233        Subjective:   Linda Donovan was seen and examined today. No fevers or chills, no chest pain, no nausea or vomiting, abdominal pain, diarrhea or constipation. Feels the shortness of breath is improving, cough is improving    Objective:   Blood pressure 134/78, pulse 135, temperature 97.6 F (36.4 C), temperature source Oral, resp. rate 16, height 5\' 3"  (1.6 m), weight 61 kg (134 lb 7.7 oz), SpO2 100 %.  Wt Readings from Last 3 Encounters:  09/12/14 61 kg (134 lb 7.7 oz)  09/16/14 60.782 kg (134 lb)  09/12/14 61.462 kg (135 lb 8 oz)     Intake/Output Summary (Last 24 hours) at 09/16/14 1038 Last data filed at 09/15/14 1520  Gross per 24 hour  Intake    360 ml  Output      0 ml  Net    360 ml    Exam  General: Alert and oriented x 3, NAD, speaking in full sentences  HEENT:  PERRLA, EOMI, Anicteic Sclera, mucous membranes moist.   Neck: Supple, no JVD, no masses  CVS: S1 and S2 clear, regular rate and rhythm, Sinus tachycardia  Respiratory: Coarse breath sounds on the right lung, left decreased breath sounds at the bases otherwise much improved  Abdomen: Soft, NT, ND, NBS  Ext: no cyanosis clubbing or edema  Neuro: AAOx3, Cr N's II- XII. Strength 5/5 upper and lower extremities bilaterally  Skin: No rashes  Psych: Normal affect and demeanor, alert and oriented x3    Data Review   Micro Results Recent Results (from the past 240 hour(s))  Blood culture (routine x 2)     Status: None (Preliminary result)   Collection Time: 09/12/14  9:08 PM  Result Value Ref Range Status   Specimen Description BLOOD R FOREARM  Final   Special Requests BOTTLES DRAWN AEROBIC AND ANAEROBIC 5CC  Final   Culture   Final           BLOOD CULTURE RECEIVED NO GROWTH TO DATE CULTURE WILL BE HELD FOR 5 DAYS BEFORE ISSUING A FINAL NEGATIVE REPORT Performed at [facility]     Report Status PENDING  Incomplete  Blood culture (routine x 2)     Status: None (Preliminary result)   Collection Time:  09/12/14  9:15 PM  Result Value Ref Range Status   Specimen Description BLOOD RIGHT ARM  Final   Special Requests BOTTLES DRAWN AEROBIC AND ANAEROBIC 4CC  Final   Culture   Final           BLOOD CULTURE RECEIVED NO GROWTH TO DATE CULTURE WILL BE HELD FOR 5 DAYS BEFORE ISSUING A FINAL NEGATIVE REPORT Performed at [facility]    Report Status PENDING  Incomplete  Culture, sputum-assessment     Status: None   Collection Time: 09/13/14  7:27 AM  Result Value Ref Range Status   Specimen Description SPUTUM  Final   Special Requests NONE  Final   Sputum evaluation   Final    THIS SPECIMEN IS ACCEPTABLE. RESPIRATORY CULTURE REPORT TO FOLLOW.   Report Status 09/13/2014 FINAL  Final  Culture, respiratory (NON-Expectorated)     Status: None   Collection Time: 09/13/14  7:27 AM  Result Value Ref Range Status   Specimen Description SPUTUM  Final   Special Requests NONE  Final   Gram Stain   Final    RARE WBC PRESENT, PREDOMINANTLY PMN RARE SQUAMOUS EPITHELIAL CELLS PRESENT FEW GRAM POSITIVE COCCI IN PAIRS IN CHAINS IN CLUSTERS RARE GRAM POSITIVE RODS RARE GRAM NEGATIVE RODS    Culture   Final    NORMAL OROPHARYNGEAL FLORA Performed at [facility]    Report Status 09/15/2014 FINAL  Final    Radiology Reports Ct Angio Chest Pe W/cm &/or Wo Cm  09/12/2014   CLINICAL DATA:  History of recurrent lung cancer with increasing shortness of breath. Patient is currently being treated for potential pneumonia.  EXAM: CT ANGIOGRAPHY CHEST WITH CONTRAST  TECHNIQUE: Multidetector CT imaging of the chest was performed using the standard protocol during bolus administration of intravenous contrast. Multiplanar CT image reconstructions and MIPs were obtained to evaluate the vascular anatomy.  CONTRAST:  18mL OMNIPAQUE IOHEXOL 350 MG/ML SOLN  COMPARISON:  July 17, 2014   FINDINGS: There is no pulmonary embolus. There is atherosclerosis of the aorta without aortic dissection or aneurysmal dilatation. The heart size is normal. There is no pericardial effusion. Mediastinal and subcarinal lymph nodes are unchanged.  Ill-defined masslike opacity is seen in the posterior right upper lung field with involvement of the right hilum unchanged compared to prior exam. There is interval developed patchy airspace opacity involving the right upper lobe, differential diagnosis includes interval developed pneumonia or radiation pneumonitis if there has been interval radiation since February 2016. There is interval increase of small to moderate right pleural effusion. The left lung is unchanged with emphysematous changes.  The visualized upper abdominal structures are unremarkable. There is a small hiatal hernia. No focal lytic or blastic lesion is identified within the visualized bones.  Review of the MIP images confirms the above findings.  IMPRESSION: Ill-defined masslike opacity identified in the right upper lung field with involvement of the right hilum consistent with patient's known recurrent lung cancer without changed compared to prior CT.  Interval developed patchy airspace opacity involving the right upper lobe, differential diagnosis includes interval foot bowel pneumonia or radiation pneumonitis if there has been interval radiation since February 2016.  Interval increase small to moderate right pleural effusion.   Electronically Signed   By: Abelardo Diesel M.D.   On: 09/12/2014 20:16   Dg Chest Port 1 View  09/12/2014   CLINICAL DATA:  Short of breath for 2 days. Chest congestion and cough for 1 month. Lung  cancer with recurrence. COPD and asthma.  EXAM: PORTABLE CHEST - 1 VIEW  COMPARISON:  08/01/2014 and PET of 08/12/2014.  FINDINGS: Numerous leads and wires project over the chest. Tracheal deviation to the right with mediastinal shift due to volume loss within the right hemithorax.  Mild cardiomegaly. Trace right pleural fluid or thickening, blunting the right costophrenic angle. No pneumothorax. Right apical pleural thickening. Right upper lobe masslike opacity is similar to slightly decreased. There is new right mid and lower lung interstitial and airspace disease, especially laterally. Clear left lung with interstitial thickening, lower lobe predominant.  IMPRESSION: Slight improvement in right upper lobe/ apical aeration. This was consistent with recurrent tumor on the 08/12/2014 PET. Development of interstitial and airspace disease in the right mid and lower lung. Considerations include radiation pneumonitis (if there is been interval radiation since 08/01/2014) or infection. Lymphangitic tumor spread is felt less likely, given time course.   Electronically Signed   By: Abigail Miyamoto M.D.   On: 09/12/2014 19:00    CBC  Recent Labs Lab 09/12/14 1444 09/12/14 1815 09/13/14 0253 09/14/14 0607 09/15/14 0430 09/16/14 0520  WBC 4.4 4.9 7.6 6.7 6.1 14.2*  HGB 12.2 14.4 10.5* 10.6* 11.5* 12.0  HCT 36.6 42.9 31.2* 32.0* 35.3* 37.2  PLT 586* 511* 483* 560* 595* 651*  MCV 98.9 99.1 99.0 101.6* 101.1* 101.9*  MCH 32.9 33.3 33.3 33.7 33.0 32.9  MCHC 33.2 33.6 33.7 33.1 32.6 32.3  RDW 12.9 12.8 12.9 13.6 13.3 13.5  LYMPHSABS 0.1* 0.2*  --   --   --   --   MONOABS 0.1 0.1  --   --   --   --   EOSABS 0.0 0.0  --   --   --   --   BASOSABS 0.0 0.0  --   --   --   --     Chemistries   Recent Labs Lab 09/12/14 1444 09/13/14 0253 09/14/14 0540 09/14/14 0607 09/15/14 0430 09/16/14 0520  NA 138 137  --  134* 139 139  K 4.0 3.9  --  3.7 4.2 4.1  CL  --  100  --  98 98 96  CO2 26 29  --  29 33* 36*  GLUCOSE 188* 141*  --  92 128* 139*  BUN 6.6* 6  --  6 6 13   CREATININE 0.6 0.47*  --  0.47* 0.44* 0.56  CALCIUM 9.3 8.5  --  8.3* 8.9 8.9  MG  --   --  1.5  --   --   --   AST 23  --   --   --   --   --   ALT 21  --   --   --   --   --   ALKPHOS 110  --   --   --   --    --   BILITOT 0.29  --   --   --   --   --    ------------------------------------------------------------------------------------------------------------------ estimated creatinine clearance is 61.9 mL/min (by C-G formula based on Cr of 0.56). ------------------------------------------------------------------------------------------------------------------ No results for input(s): HGBA1C in the last 72 hours. ------------------------------------------------------------------------------------------------------------------ No results for input(s): CHOL, HDL, LDLCALC, TRIG, CHOLHDL, LDLDIRECT in the last 72 hours. ------------------------------------------------------------------------------------------------------------------ No results for input(s): TSH, T4TOTAL, T3FREE, THYROIDAB in the last 72 hours.  Invalid input(s): FREET3 ------------------------------------------------------------------------------------------------------------------ No results for input(s): VITAMINB12, FOLATE, FERRITIN, TIBC, IRON, RETICCTPCT in the last 72 hours.  Coagulation profile No results for input(s):  INR, PROTIME in the last 168 hours.  No results for input(s): DDIMER in the last 72 hours.  Cardiac Enzymes  Recent Labs Lab 09/12/14 2108 09/13/14 0233 09/13/14 0845  TROPONINI 0.53* 0.72* 0.56*   ------------------------------------------------------------------------------------------------------------------ Invalid input(s): POCBNP  No results for input(s): GLUCAP in the last 72 hours.   Oland Arquette M.D. [facility] 09/16/2014, 10:38 AM  Pager: [phone]   Between 7am to 7pm - call Pager - [phone]  After 7pm go to [email or web address] - password [email or web address]  Call night coverage person covering after 7pm

## 2014-09-17 ENCOUNTER — Telehealth: Payer: Self-pay | Admitting: Oncology

## 2014-09-17 ENCOUNTER — Ambulatory Visit: Payer: BLUE CROSS/BLUE SHIELD | Admitting: Radiation Oncology

## 2014-09-17 ENCOUNTER — Ambulatory Visit: Payer: BLUE CROSS/BLUE SHIELD

## 2014-09-17 DIAGNOSIS — R7989 Other specified abnormal findings of blood chemistry: Secondary | ICD-10-CM

## 2014-09-17 DIAGNOSIS — I5189 Other ill-defined heart diseases: Secondary | ICD-10-CM | POA: Insufficient documentation

## 2014-09-17 DIAGNOSIS — I420 Dilated cardiomyopathy: Secondary | ICD-10-CM

## 2014-09-17 DIAGNOSIS — I519 Heart disease, unspecified: Secondary | ICD-10-CM

## 2014-09-17 DIAGNOSIS — I5021 Acute systolic (congestive) heart failure: Secondary | ICD-10-CM | POA: Diagnosis present

## 2014-09-17 LAB — BASIC METABOLIC PANEL
ANION GAP: 8 (ref 5–15)
BUN: 19 mg/dL (ref 6–23)
CALCIUM: 8.9 mg/dL (ref 8.4–10.5)
CHLORIDE: 94 mmol/L — AB (ref 96–112)
CO2: 37 mmol/L — ABNORMAL HIGH (ref 19–32)
CREATININE: 0.57 mg/dL (ref 0.50–1.10)
Glucose, Bld: 120 mg/dL — ABNORMAL HIGH (ref 70–99)
POTASSIUM: 4.2 mmol/L (ref 3.5–5.1)
SODIUM: 139 mmol/L (ref 135–145)

## 2014-09-17 LAB — MRSA PCR SCREENING: MRSA by PCR: NEGATIVE

## 2014-09-17 MED ORDER — LORAZEPAM 1 MG PO TABS
1.0000 mg | ORAL_TABLET | Freq: Three times a day (TID) | ORAL | Status: DC | PRN
  Administered 2014-09-17 – 2014-09-18 (×2): 1 mg via ORAL
  Filled 2014-09-17 (×2): qty 1

## 2014-09-17 MED ORDER — LORAZEPAM 1 MG PO TABS
1.0000 mg | ORAL_TABLET | Freq: Once | ORAL | Status: AC
  Administered 2014-09-17: 1 mg via ORAL
  Filled 2014-09-17: qty 1

## 2014-09-17 MED ORDER — METHYLPREDNISOLONE SODIUM SUCC 125 MG IJ SOLR
60.0000 mg | Freq: Two times a day (BID) | INTRAMUSCULAR | Status: DC
  Administered 2014-09-17 – 2014-09-19 (×5): 60 mg via INTRAVENOUS
  Filled 2014-09-17: qty 0.96
  Filled 2014-09-17: qty 2
  Filled 2014-09-17 (×5): qty 0.96

## 2014-09-17 MED ORDER — LEVALBUTEROL HCL 0.63 MG/3ML IN NEBU
0.6300 mg | INHALATION_SOLUTION | Freq: Four times a day (QID) | RESPIRATORY_TRACT | Status: DC
  Administered 2014-09-18 – 2014-09-21 (×13): 0.63 mg via RESPIRATORY_TRACT
  Filled 2014-09-17 (×24): qty 3

## 2014-09-17 NOTE — Patient Instructions (Signed)
Continue weekly labs as scheduled Complete your course of antibiotics as prescribed Follow-up in 3 weeks prior to the start of your next scheduled cycle of chemotherapy

## 2014-09-17 NOTE — Progress Notes (Signed)
SUBJECTIVE:  Coughing and SOB  OBJECTIVE:   Vitals:   Filed Vitals:   09/16/14 1348 09/16/14 1920 09/16/14 2150 09/17/14 0512  BP: 119/73  111/68 102/68  Pulse: 130  126 103  Temp: 97.4 F (36.3 C)  97.9 F (36.6 C) 98.4 F (36.9 C)  TempSrc: Oral  Oral Oral  Resp:      Height:      Weight:      SpO2: 99% 98% 100% 100%   I&O's:   Intake/Output Summary (Last 24 hours) at 09/17/14 0725 Last data filed at 09/17/14 0500  Gross per 24 hour  Intake    290 ml  Output   1900 ml  Net  -1610 ml   TELEMETRY: Reviewed telemetry pt in sinus tachycardia from 103-130bpm     PHYSICAL EXAM General: Well developed, well nourished, in no acute distress Head: Eyes PERRLA, No xanthomas.   Normal cephalic and atramatic  Lungs:  Diffuse bilateralscattered rhonchi Heart:   HRRR S1 S2 Pulses are 2+ & equal. Abdomen: Bowel sounds are positive, abdomen soft and non-tender without masses  Extremities:   No clubbing, cyanosis or edema.  DP +1 Neuro: Alert and oriented X 3. Psych:  Good affect, responds appropriately   LABS: Basic Metabolic Panel:  Recent Labs  09/16/14 0520 09/17/14 0528  NA 139 139  K 4.1 4.2  CL 96 94*  CO2 36* 37*  GLUCOSE 139* 120*  BUN 13 19  CREATININE 0.56 0.57  CALCIUM 8.9 8.9   Liver Function Tests: No results for input(s): AST, ALT, ALKPHOS, BILITOT, PROT, ALBUMIN in the last 72 hours. No results for input(s): LIPASE, AMYLASE in the last 72 hours. CBC:  Recent Labs  09/15/14 0430 09/16/14 0520  WBC 6.1 14.2*  HGB 11.5* 12.0  HCT 35.3* 37.2  MCV 101.1* 101.9*  PLT 595* 651*   Cardiac Enzymes: No results for input(s): CKTOTAL, CKMB, CKMBINDEX, TROPONINI in the last 72 hours. BNP: Invalid input(s): POCBNP D-Dimer: No results for input(s): DDIMER in the last 72 hours. Hemoglobin A1C: No results for input(s): HGBA1C in the last 72 hours. Fasting Lipid Panel: No results for input(s): CHOL, HDL, LDLCALC, TRIG, CHOLHDL, LDLDIRECT in the last  72 hours. Thyroid Function Tests: No results for input(s): TSH, T4TOTAL, T3FREE, THYROIDAB in the last 72 hours.  Invalid input(s): FREET3 Anemia Panel: No results for input(s): VITAMINB12, FOLATE, FERRITIN, TIBC, IRON, RETICCTPCT in the last 72 hours. Coag Panel:   Lab Results  Component Value Date   INR 1.01 01/03/2014   INR 0.89 09/04/2012    RADIOLOGY: Ct Angio Chest Pe W/cm &/or Wo Cm  09/12/2014   CLINICAL DATA:  History of recurrent lung cancer with increasing shortness of breath. Patient is currently being treated for potential pneumonia.  EXAM: CT ANGIOGRAPHY CHEST WITH CONTRAST  TECHNIQUE: Multidetector CT imaging of the chest was performed using the standard protocol during bolus administration of intravenous contrast. Multiplanar CT image reconstructions and MIPs were obtained to evaluate the vascular anatomy.  CONTRAST:  56mL OMNIPAQUE IOHEXOL 350 MG/ML SOLN  COMPARISON:  July 17, 2014  FINDINGS: There is no pulmonary embolus. There is atherosclerosis of the aorta without aortic dissection or aneurysmal dilatation. The heart size is normal. There is no pericardial effusion. Mediastinal and subcarinal lymph nodes are unchanged.  Ill-defined masslike opacity is seen in the posterior right upper lung field with involvement of the right hilum unchanged compared to prior exam. There is interval developed patchy airspace opacity involving the  right upper lobe, differential diagnosis includes interval developed pneumonia or radiation pneumonitis if there has been interval radiation since February 2016. There is interval increase of small to moderate right pleural effusion. The left lung is unchanged with emphysematous changes.  The visualized upper abdominal structures are unremarkable. There is a small hiatal hernia. No focal lytic or blastic lesion is identified within the visualized bones.  Review of the MIP images confirms the above findings.  IMPRESSION: Ill-defined masslike opacity  identified in the right upper lung field with involvement of the right hilum consistent with patient's known recurrent lung cancer without changed compared to prior CT.  Interval developed patchy airspace opacity involving the right upper lobe, differential diagnosis includes interval foot bowel pneumonia or radiation pneumonitis if there has been interval radiation since February 2016.  Interval increase small to moderate right pleural effusion.   Electronically Signed   By: Abelardo Diesel M.D.   On: 09/12/2014 20:16   Dg Chest Port 1 View  09/16/2014   CLINICAL DATA:  59 year old female with a history of pneumonia and shortness of breath. Known lung carcinoma.  EXAM: PORTABLE CHEST - 1 VIEW  COMPARISON:  CT chest 09/12/2014, chest x-ray 09/12/2014, PET-CT 08/12/2014, chest x-ray 08/01/2014  FINDINGS: Cardiac size unchanged.  Contour of the mediastinum is similar, with obscuration of the right mediastinum secondary to right upper lobe/hilar consolidation/mass, characterized previously on the CT and PET-CT.  Elevation of the minor fissure.  Improved appearance of airspace opacities and interlobular septal thickening of the right lower lobe compared to the prior chest x-ray.  Right pleural parenchymal opacity at the apex persists as well as airspace disease of the right upper lobe.  No left-sided consolidative airspace disease. Similar appearance of the interstitial opacities at the base.  Persistent blunting of the right costophrenic angle. No large with left-sided pleural effusion.  No displaced fracture.  IMPRESSION: Improved airspace and interstitial opacities of the right lower lung, suggesting improved pneumonia/ pneumonitis.  Persistent right hilar and right upper lobe consolidative changes compatible with the patient's known invasive carcinoma. These changes were better characterized on recent CT chest.  Trace right-sided pleural effusion.  Signed,  Dulcy Fanny. Earleen Newport, DO  [facility]  [facility]   Electronically Signed   By: Corrie Mckusick D.O.   On: 09/16/2014 14:05   Dg Chest Port 1 View  09/12/2014   CLINICAL DATA:  Short of breath for 2 days. Chest congestion and cough for 1 month. Lung cancer with recurrence. COPD and asthma.  EXAM: PORTABLE CHEST - 1 VIEW  COMPARISON:  08/01/2014 and PET of 08/12/2014.  FINDINGS: Numerous leads and wires project over the chest. Tracheal deviation to the right with mediastinal shift due to volume loss within the right hemithorax. Mild cardiomegaly. Trace right pleural fluid or thickening, blunting the right costophrenic angle. No pneumothorax. Right apical pleural thickening. Right upper lobe masslike opacity is similar to slightly decreased. There is new right mid and lower lung interstitial and airspace disease, especially laterally. Clear left lung with interstitial thickening, lower lobe predominant.  IMPRESSION: Slight improvement in right upper lobe/ apical aeration. This was consistent with recurrent tumor on the 08/12/2014 PET. Development of interstitial and airspace disease in the right mid and lower lung. Considerations include radiation pneumonitis (if there is been interval radiation since 08/01/2014) or infection. Lymphangitic tumor spread is felt less likely, given time course.   Electronically Signed   By: Abigail Miyamoto M.D.   On:  09/12/2014 19:00   Mr Card Morphology W/o Cm  09/16/2014   CLINICAL DATA:  59 year old female  EXAM: CARDIAC MRI  TECHNIQUE: The patient was scanned on a 1.5 Tesla GE magnet. A dedicated cardiac coil was used. Functional imaging was done using Fiesta sequences. 2,3, and 4 chamber views were done to assess for RWMA's. Modified Simpson's rule using a short axis stack was used to calculate an ejection fraction on a dedicated work [employer] officer, nature. The patient received no contrast. After 10 minutes inversion recovery sequences were used to assess for infiltration and scar tissue.   CONTRAST:  None.  FINDINGS: This study is of limited quality secondary to tachycardia, tachypnea and patient's inability to hold breath.  1. Normal left ventricular size, thickness and severely decreased systolic function (LVEF = 26%). Systolic function might be underestimated secondary to significant tachycardia during acquisition.  There is diffuse hypokinesis.  LVEDV:  152 ml  LVESV:  112 ml  SV:  40 ml  CO:  3.6 L/minute  2. Normal ventricular size, thickness and mildly to moderately reduced systolic function (RVEF = 34%).  3.  Normal biatrial size.  4.  Mild mitral and mild to moderate tricuspid regurgitation.  5.  Normal size of the aortic root and ascending aorta.  6. Moderately dilated pulmonary artery (38 mm) suggestive of significant pulmonary hypertension.  IMPRESSION: 1. Normal left ventricular size, thickness and severely decreased systolic function (LVEF = 26%). Systolic function might be underestimated secondary to significant tachycardia during acquisition.  There is diffuse hypokinesis.  2. Normal ventricular size, thickness and mildly to moderately reduced systolic function (RVEF = 34%).  3.  Normal biatrial size.  4.  Mild mitral and mild to moderate tricuspid regurgitation.  5.  Normal size of the aortic root and ascending aorta.  6. Moderately dilated pulmonary artery (38 mm) suggestive of significant pulmonary hypertension.  Ena Dawley   Electronically Signed   By: Ena Dawley   On: 09/16/2014 15:37   ASSESSMENT AND PLAN 1. Left ventricular systolic dysfunction, new since August 2015 . possibly secondary to chemotherapy>  EF by MRI was 26% and RVF EF 36%.   Mild to moderate TR with mild Pulmonary HTN also noted. ACE I/ARB on hold due to soft BP. Continue BB.   2. Sinus tachycardia - persists despite BB therapy.  BP is soft limiting ability to increase BB further.  Unclear if low CO from LV dysfunction is driving tachycardia or underlying pulmonary issues - most likely a  combination of both.  I have recommended that she be transferred to [facility] under Hospitalist care and get Advanced Heart Failure team involved.   3. COPD and non-small cell lung cancer and hypoxic respiratory failure secondary to COPD exacerbation and acute systolic CHF 4. Mildly elevated troponin . doubt acute coronary syndrome - most likely secondary to COPD exacerbation with acute CHF and tachycardia. 5. QT prolongation possibly secondary to Zithromax 6.  Acute systolic CHF - BNP elevated.  Chest xray with no CHF (verny minimal pleural effusion) - continue PO Lasix due to soft BP and Advanced Heart failure to see to guide further therapy.     Sueanne Margarita, MD  09/17/2014  7:25 AM

## 2014-09-17 NOTE — Progress Notes (Signed)
Notified Md about pt's hr 130's.  Pt little anxious.  Gave 2200 lopressor 25 mg early and gave ativan 1mg  po..  Will continue to monitor. Saunders Revel T

## 2014-09-17 NOTE — Progress Notes (Signed)
[facility]                                                                              Patient Demographics  Linda Donovan, is a 59 y.o. female, DOB - Sep 23, 1955, EQA:[id number]  Admit date - 09/12/2014   Admitting Physician Etta Quill, DO  Outpatient Primary MD for the patient is Pcp Not In System  LOS - 5   Chief Complaint  Patient presents with  . Shortness of Breath       Brief HPI   Linda Donovan is a 59 y.o. female with h/o NSCLC, COPD on 2L home O2 at baseline. Patient presents to ED with c/o 1 day history of SOB. She has been having productive cough for past couple of days as well, though she has had cough for a prolonged (months) time frame due to lung cancer. Last week she began radiation therapy for her NSCLC in her RUL.On the day of admission, at therapy she was noted to have productive cough and was sent in to the ED. Patient was found to have right upper lobe consolidation with acute on chronic hypoxic respiratory failure, radiation pneumonitis. Patient was placed on IV antibiotics, IV Solu-Medrol which has been slowly tapered. Oncology has been following. Troponins were positive at the time of admission, and it trended up 0.72, cardiology was consulted. 2-D echo showed EF of 25-30% with severe pulmonary hypertension, PA pressure 60 mmHg. 2-D echo in 8/15 had shown EF of 55-60%. Cardiac MRI was done per cardiology recommendations which showed severely decreased left ventricular systolic function EF 97%, diffuse hypokinesis, moderate moderately reduced right-sided systolic function, RVEF 98% with mild-to-moderate tricuspid regurgitation, significant pulmonary hypertension. Patient remains tachypneic, borderline hypotensive with tachycardia, unable to increase diuresis or beta blockers.  On 4/12, cardiology, Dr. Radford Pax recommended patient be transferred to [facility] for advanced heart failure team to follow.  Assessment & Plan     Principal Problem:   Right upper lobe consolidation with acute on chronic hypoxic respiratory failure; pneumonia versus radiation pneumonitis- improving slowly -  Currently on Zithromax and Rocephin, day #4, continue O2,  Xopenex nebs, Symbicort, Solu-Medrol.  - Tapered Solu-Medrol to 60 mg every 12 hours.  - Influenza panel negative, discontinued Tamiflu - Urine strep antigen negative so far, HIV negative, urine legionella antigen negative, blood cultures negative so far , sputum culture showed normal oropharyngeal flora.  - Repeat chest x-ray was obtained 4/11 showed improved airspace and interstitial opacities of the right lower lung, improved pneumonia/pneumonitis. Persistent right hilar and right upper lobe consolidative changes compatible with known invasive carcinoma, trace right-sided pleural effusion.  Active Problems: COPD exacerbation, acute on chronic hypoxic respiratory failure - Slowly improving, continue xopenex nebs, Symbicort, Solu-Medrol, continue IV Rocephin and Zithromax  Elevated troponin, left ventricular systolic dysfunction, new, combined right and left ventricular/biventricular failure: Likely due to #1 with sinus tachycardia, possibly due to chemotherapy - 2-D echo showed EF 25-30%, pulmonary hypertension with PA pressure of 60 - Patient was placed on diuresis, aspirin, beta blocker by cardiology. - Cardiac MRI showed severely decreased left ventricular systolic function EF 92%, diffuse hypokinesis, moderate  moderately reduced right-sided systolic function, RVEF 47% with mild-to-moderate tricuspid regurgitation, significant pulmonary hypertension. Patient remains tachypneic, borderline hypotensive with tachycardia, unable to increase diuresis or beta blockers. Dr. Radford Pax recommended patient be transferred to [facility] for advanced heart failure team to follow.   Non-small cell lung cancer - On radiotherapy, supposed to begin chemotherapy next week,  oncology following, Dr. Julien Nordmann  Prolonged QTC on EKG - Avoid QTC prolonging medicines, potassium 4.2  Code Status: Full code   Family Communication: Discussed in detail with the patient, all imaging results, lab results explained to the patient and husband, patient's mother at the bedside   Disposition Plan: Transfer to [facility] step down today, signout given to Dr. Dia Crawford. Advanced heart failure team called by Dr. Radford Pax.  Time spent   35  minutes  Procedures  none  Consults   Cardiology Dr Radford Pax  oncology, Dr. Julien Nordmann  DVT Prophylaxis  Lovenox   Medications  Scheduled Meds: . antiseptic oral rinse  7 mL Mouth Rinse BID  . aspirin  81 mg Oral Daily  . budesonide-formoterol  2 puff Inhalation BID  . cefTRIAXone (ROCEPHIN)  IV  1 g Intravenous Q24H  . docusate sodium  100 mg Oral BID  . enoxaparin (LOVENOX) injection  40 mg Subcutaneous Q24H  . folic acid  1 mg Oral Daily  . furosemide  40 mg Oral Daily  . levalbuterol  0.63 mg Nebulization Q4H  . LORazepam  1 mg Oral Once  . magnesium oxide  400 mg Oral BID  . methylPREDNISolone (SOLU-MEDROL) injection  60 mg Intravenous Q12H  . metoprolol tartrate  25 mg Oral BID  . sucralfate  1 g Oral TID WC & HS  . traZODone  50 mg Oral QHS   Continuous Infusions:   PRN Meds:.albuterol, chlorpheniramine-HYDROcodone, oxyCODONE-acetaminophen, polyethylene glycol   Antibiotics   Anti-infectives    Start     Dose/Rate Route Frequency Ordered Stop   09/13/14 2000  cefTRIAXone (ROCEPHIN) 1 g in dextrose 5 % 50 mL IVPB     1 g 100 mL/hr over 30 Minutes Intravenous Every 24 hours 09/12/14 2045 09/20/14 1959   09/12/14 2200  oseltamivir (TAMIFLU) capsule 75 mg  Status:  Discontinued     75 mg Oral 2 times daily 09/12/14 2022 09/13/14 1214   09/12/14 2030  cefTRIAXone (ROCEPHIN) 1 g in dextrose 5 % 50 mL IVPB     1 g 100 mL/hr over 30 Minutes Intravenous  Once 09/12/14 2022 09/12/14 2153   09/12/14 2030  azithromycin  (ZITHROMAX) 500 mg in dextrose 5 % 250 mL IVPB     500 mg 250 mL/hr over 60 Minutes Intravenous  Once 09/12/14 2022 09/13/14 0003   09/12/14 2000  azithromycin (ZITHROMAX) tablet 500 mg  Status:  Discontinued     500 mg Oral Every 24 hours 09/12/14 2045 09/15/14 1233        Subjective:   Linda Donovan was seen and examined today. Short of breath today, somewhat anxious after recommended transfer to [facility] by cardiology. Still having tachycardia. No fevers or chills, cough is improving. No nausea, vomiting, abdominal pain diarrhea or constipation.    Objective:   Blood pressure 102/68, pulse 103, temperature 98.4 F (36.9 C), temperature source Oral, resp. rate 16, height 5\' 3"  (1.6 m), weight 61 kg (134 lb 7.7 oz), SpO2 100 %.  Wt Readings from Last 3 Encounters:  09/12/14 61 kg (134 lb 7.7 oz)  09/16/14 60.782 kg (134  lb)  09/12/14 61.462 kg (135 lb 8 oz)     Intake/Output Summary (Last 24 hours) at 09/17/14 0900 Last data filed at 09/17/14 0500  Gross per 24 hour  Intake    290 ml  Output   1900 ml  Net  -1610 ml    Exam  General: Alert and oriented x 3, anxious and depressed   HEENT:  PERRLA, EOMI, Anicteic Sclera, mucous membranes moist.   Neck: Supple, no JVD, no masses  CVS: S1 and S2 clear, regular rate and rhythm, Sinus tachycardia  Respiratory:  diffuse coarse breath sounds with bibasilar crackles   Abdomen: Soft, NT, ND, NBS  Ext: no cyanosis clubbing or edema  Neuro: AAOx3, Cr N's II- XII. Strength 5/5 upper and lower extremities bilaterally  Skin: No rashes  Psych:  appears anxious and depressed today    Data Review   Micro Results Recent Results (from the past 240 hour(s))  Blood culture (routine x 2)     Status: None (Preliminary result)   Collection Time: 09/12/14  9:08 PM  Result Value Ref Range Status   Specimen Description BLOOD R FOREARM  Final   Special Requests BOTTLES DRAWN AEROBIC AND ANAEROBIC 5CC  Final   Culture   Final            BLOOD CULTURE RECEIVED NO GROWTH TO DATE CULTURE WILL BE HELD FOR 5 DAYS BEFORE ISSUING A FINAL NEGATIVE REPORT Performed at [facility]    Report Status PENDING  Incomplete  Blood culture (routine x 2)     Status: None (Preliminary result)   Collection Time: 09/12/14  9:15 PM  Result Value Ref Range Status   Specimen Description BLOOD RIGHT ARM  Final   Special Requests BOTTLES DRAWN AEROBIC AND ANAEROBIC 4CC  Final   Culture   Final           BLOOD CULTURE RECEIVED NO GROWTH TO DATE CULTURE WILL BE HELD FOR 5 DAYS BEFORE ISSUING A FINAL NEGATIVE REPORT Performed at [facility]    Report Status PENDING  Incomplete  Culture, sputum-assessment     Status: None   Collection Time: 09/13/14  7:27 AM  Result Value Ref Range Status   Specimen Description SPUTUM  Final   Special Requests NONE  Final   Sputum evaluation   Final    THIS SPECIMEN IS ACCEPTABLE. RESPIRATORY CULTURE REPORT TO FOLLOW.   Report Status 09/13/2014 FINAL  Final  Culture, respiratory (NON-Expectorated)     Status: None   Collection Time: 09/13/14  7:27 AM  Result Value Ref Range Status   Specimen Description SPUTUM  Final   Special Requests NONE  Final   Gram Stain   Final    RARE WBC PRESENT, PREDOMINANTLY PMN RARE SQUAMOUS EPITHELIAL CELLS PRESENT FEW GRAM POSITIVE COCCI IN PAIRS IN CHAINS IN CLUSTERS RARE GRAM POSITIVE RODS RARE GRAM NEGATIVE RODS    Culture   Final    NORMAL OROPHARYNGEAL FLORA Performed at [facility]    Report Status 09/15/2014 FINAL  Final    Radiology Reports Ct Angio Chest Pe W/cm &/or Wo Cm  09/12/2014   CLINICAL DATA:  History of recurrent lung cancer with increasing shortness of breath. Patient is currently being treated for potential pneumonia.  EXAM: CT ANGIOGRAPHY CHEST WITH CONTRAST  TECHNIQUE: Multidetector CT imaging of the chest was performed using the standard protocol during bolus administration of intravenous contrast. Multiplanar CT  image reconstructions and MIPs were obtained to  evaluate the vascular anatomy.  CONTRAST:  66mL OMNIPAQUE IOHEXOL 350 MG/ML SOLN  COMPARISON:  July 17, 2014  FINDINGS: There is no pulmonary embolus. There is atherosclerosis of the aorta without aortic dissection or aneurysmal dilatation. The heart size is normal. There is no pericardial effusion. Mediastinal and subcarinal lymph nodes are unchanged.  Ill-defined masslike opacity is seen in the posterior right upper lung field with involvement of the right hilum unchanged compared to prior exam. There is interval developed patchy airspace opacity involving the right upper lobe, differential diagnosis includes interval developed pneumonia or radiation pneumonitis if there has been interval radiation since February 2016. There is interval increase of small to moderate right pleural effusion. The left lung is unchanged with emphysematous changes.  The visualized upper abdominal structures are unremarkable. There is a small hiatal hernia. No focal lytic or blastic lesion is identified within the visualized bones.  Review of the MIP images confirms the above findings.  IMPRESSION: Ill-defined masslike opacity identified in the right upper lung field with involvement of the right hilum consistent with patient's known recurrent lung cancer without changed compared to prior CT.  Interval developed patchy airspace opacity involving the right upper lobe, differential diagnosis includes interval foot bowel pneumonia or radiation pneumonitis if there has been interval radiation since February 2016.  Interval increase small to moderate right pleural effusion.   Electronically Signed   By: Abelardo Diesel M.D.   On: 09/12/2014 20:16   Dg Chest Port 1 View  09/16/2014   CLINICAL DATA:  59 year old female with a history of pneumonia and shortness of breath. Known lung carcinoma.  EXAM: PORTABLE CHEST - 1 VIEW  COMPARISON:  CT chest 09/12/2014, chest x-ray 09/12/2014, PET-CT  08/12/2014, chest x-ray 08/01/2014  FINDINGS: Cardiac size unchanged.  Contour of the mediastinum is similar, with obscuration of the right mediastinum secondary to right upper lobe/hilar consolidation/mass, characterized previously on the CT and PET-CT.  Elevation of the minor fissure.  Improved appearance of airspace opacities and interlobular septal thickening of the right lower lobe compared to the prior chest x-ray.  Right pleural parenchymal opacity at the apex persists as well as airspace disease of the right upper lobe.  No left-sided consolidative airspace disease. Similar appearance of the interstitial opacities at the base.  Persistent blunting of the right costophrenic angle. No large with left-sided pleural effusion.  No displaced fracture.  IMPRESSION: Improved airspace and interstitial opacities of the right lower lung, suggesting improved pneumonia/ pneumonitis.  Persistent right hilar and right upper lobe consolidative changes compatible with the patient's known invasive carcinoma. These changes were better characterized on recent CT chest.  Trace right-sided pleural effusion.  Signed,  Dulcy Fanny. Earleen Newport, DO  [facility]  [facility]   Electronically Signed   By: Corrie Mckusick D.O.   On: 09/16/2014 14:05   Dg Chest Port 1 View  09/12/2014   CLINICAL DATA:  Short of breath for 2 days. Chest congestion and cough for 1 month. Lung cancer with recurrence. COPD and asthma.  EXAM: PORTABLE CHEST - 1 VIEW  COMPARISON:  08/01/2014 and PET of 08/12/2014.  FINDINGS: Numerous leads and wires project over the chest. Tracheal deviation to the right with mediastinal shift due to volume loss within the right hemithorax. Mild cardiomegaly. Trace right pleural fluid or thickening, blunting the right costophrenic angle. No pneumothorax. Right apical pleural thickening. Right upper lobe masslike opacity is similar to slightly decreased. There is new right mid and lower  lung interstitial and airspace disease, especially laterally. Clear left lung with interstitial thickening, lower lobe predominant.  IMPRESSION: Slight improvement in right upper lobe/ apical aeration. This was consistent with recurrent tumor on the 08/12/2014 PET. Development of interstitial and airspace disease in the right mid and lower lung. Considerations include radiation pneumonitis (if there is been interval radiation since 08/01/2014) or infection. Lymphangitic tumor spread is felt less likely, given time course.   Electronically Signed   By: Abigail Miyamoto M.D.   On: 09/12/2014 19:00   Mr Card Morphology W/o Cm  09/16/2014   CLINICAL DATA:  59 year old female  EXAM: CARDIAC MRI  TECHNIQUE: The patient was scanned on a 1.5 Tesla GE magnet. A dedicated cardiac coil was used. Functional imaging was done using Fiesta sequences. 2,3, and 4 chamber views were done to assess for RWMA's. Modified Simpson's rule using a short axis stack was used to calculate an ejection fraction on a dedicated work Conservation officer, nature. The patient received no contrast. After 10 minutes inversion recovery sequences were used to assess for infiltration and scar tissue.  CONTRAST:  None.  FINDINGS: This study is of limited quality secondary to tachycardia, tachypnea and patient's inability to hold breath.  1. Normal left ventricular size, thickness and severely decreased systolic function (LVEF = 26%). Systolic function might be underestimated secondary to significant tachycardia during acquisition.  There is diffuse hypokinesis.  LVEDV:  152 ml  LVESV:  112 ml  SV:  40 ml  CO:  3.6 L/minute  2. Normal ventricular size, thickness and mildly to moderately reduced systolic function (RVEF = 34%).  3.  Normal biatrial size.  4.  Mild mitral and mild to moderate tricuspid regurgitation.  5.  Normal size of the aortic root and ascending aorta.  6. Moderately dilated pulmonary artery (38 mm) suggestive of significant pulmonary  hypertension.  IMPRESSION: 1. Normal left ventricular size, thickness and severely decreased systolic function (LVEF = 26%). Systolic function might be underestimated secondary to significant tachycardia during acquisition.  There is diffuse hypokinesis.  2. Normal ventricular size, thickness and mildly to moderately reduced systolic function (RVEF = 34%).  3.  Normal biatrial size.  4.  Mild mitral and mild to moderate tricuspid regurgitation.  5.  Normal size of the aortic root and ascending aorta.  6. Moderately dilated pulmonary artery (38 mm) suggestive of significant pulmonary hypertension.  Ena Dawley   Electronically Signed   By: Ena Dawley   On: 09/16/2014 15:37    CBC  Recent Labs Lab 09/12/14 1444 09/12/14 1815 09/13/14 0253 09/14/14 0607 09/15/14 0430 09/16/14 0520  WBC 4.4 4.9 7.6 6.7 6.1 14.2*  HGB 12.2 14.4 10.5* 10.6* 11.5* 12.0  HCT 36.6 42.9 31.2* 32.0* 35.3* 37.2  PLT 586* 511* 483* 560* 595* 651*  MCV 98.9 99.1 99.0 101.6* 101.1* 101.9*  MCH 32.9 33.3 33.3 33.7 33.0 32.9  MCHC 33.2 33.6 33.7 33.1 32.6 32.3  RDW 12.9 12.8 12.9 13.6 13.3 13.5  LYMPHSABS 0.1* 0.2*  --   --   --   --   MONOABS 0.1 0.1  --   --   --   --   EOSABS 0.0 0.0  --   --   --   --   BASOSABS 0.0 0.0  --   --   --   --     Chemistries   Recent Labs Lab 09/12/14 1444 09/13/14 0253 09/14/14 0540 09/14/14 1962 09/15/14 0430 09/16/14 0520 09/17/14  0528  NA 138 137  --  134* 139 139 139  K 4.0 3.9  --  3.7 4.2 4.1 4.2  CL  --  100  --  98 98 96 94*  CO2 26 29  --  29 33* 36* 37*  GLUCOSE 188* 141*  --  92 128* 139* 120*  BUN 6.6* 6  --  6 6 13 19   CREATININE 0.6 0.47*  --  0.47* 0.44* 0.56 0.57  CALCIUM 9.3 8.5  --  8.3* 8.9 8.9 8.9  MG  --   --  1.5  --   --   --   --   AST 23  --   --   --   --   --   --   ALT 21  --   --   --   --   --   --   ALKPHOS 110  --   --   --   --   --   --   BILITOT 0.29  --   --   --   --   --   --     ------------------------------------------------------------------------------------------------------------------ estimated creatinine clearance is 61.9 mL/min (by C-G formula based on Cr of 0.57). ------------------------------------------------------------------------------------------------------------------ No results for input(s): HGBA1C in the last 72 hours. ------------------------------------------------------------------------------------------------------------------ No results for input(s): CHOL, HDL, LDLCALC, TRIG, CHOLHDL, LDLDIRECT in the last 72 hours. ------------------------------------------------------------------------------------------------------------------ No results for input(s): TSH, T4TOTAL, T3FREE, THYROIDAB in the last 72 hours.  Invalid input(s): FREET3 ------------------------------------------------------------------------------------------------------------------ No results for input(s): VITAMINB12, FOLATE, FERRITIN, TIBC, IRON, RETICCTPCT in the last 72 hours.  Coagulation profile No results for input(s): INR, PROTIME in the last 168 hours.  No results for input(s): DDIMER in the last 72 hours.  Cardiac Enzymes  Recent Labs Lab 09/12/14 2108 09/13/14 0233 09/13/14 0845  TROPONINI 0.53* 0.72* 0.56*   ------------------------------------------------------------------------------------------------------------------ Invalid input(s): POCBNP  No results for input(s): GLUCAP in the last 72 hours.   Linda Donovan M.D. [facility] 09/17/2014, 9:00 AM  Pager: [phone]   Between 7am to 7pm - call Pager - [phone]  After 7pm go to [email or web address] - password [email or web address]  Call night coverage person covering after 7pm

## 2014-09-17 NOTE — Telephone Encounter (Signed)
Called and talked to [place], RN on [place] is going to be transferred to [facility] today as soon as they have a bed.  She said that Linda Donovan is very weak and would not be able to lay flat during radiation treatment.  Called Linda Donovan and she said that she is being transferred in a few minutes.  She does not want treatment today since she is being transferred.  Notified Dr. Sondra Come and [place].

## 2014-09-18 ENCOUNTER — Inpatient Hospital Stay (HOSPITAL_COMMUNITY): Payer: BLUE CROSS/BLUE SHIELD

## 2014-09-18 ENCOUNTER — Ambulatory Visit: Payer: BLUE CROSS/BLUE SHIELD

## 2014-09-18 ENCOUNTER — Ambulatory Visit
Admit: 2014-09-18 | Discharge: 2014-09-18 | Disposition: A | Discharge status: Home / Self Care | Payer: BLUE CROSS/BLUE SHIELD | Attending: Radiation Oncology | Admitting: Radiation Oncology

## 2014-09-18 LAB — BASIC METABOLIC PANEL
Anion gap: 8 (ref 5–15)
BUN: 16 mg/dL (ref 6–23)
CHLORIDE: 93 mmol/L — AB (ref 96–112)
CO2: 38 mmol/L — ABNORMAL HIGH (ref 19–32)
Calcium: 8.8 mg/dL (ref 8.4–10.5)
Creatinine, Ser: 0.51 mg/dL (ref 0.50–1.10)
GFR calc Af Amer: >90 mL/min (ref >90)
GLUCOSE: 116 mg/dL — AB (ref 70–99)
Potassium: 4.3 mmol/L (ref 3.5–5.1)
SODIUM: 139 mmol/L (ref 135–145)

## 2014-09-18 MED ORDER — SPIRONOLACTONE 25 MG PO TABS
12.5000 mg | ORAL_TABLET | Freq: Every day | ORAL | Status: DC
  Administered 2014-09-18 – 2014-09-21 (×4): 12.5 mg via ORAL
  Filled 2014-09-18 (×4): qty 1

## 2014-09-18 MED ORDER — LEVALBUTEROL HCL 0.63 MG/3ML IN NEBU: 0.6300 mg | INHALATION_SOLUTION | RESPIRATORY_TRACT | Status: DC | PRN

## 2014-09-18 MED ORDER — FUROSEMIDE 10 MG/ML IJ SOLN
40.0000 mg | Freq: Once | INTRAMUSCULAR | Status: AC
  Administered 2014-09-18: 40 mg via INTRAVENOUS
  Filled 2014-09-18: qty 4

## 2014-09-18 MED ORDER — METOPROLOL SUCCINATE ER 25 MG PO TB24
25.0000 mg | ORAL_TABLET | Freq: Two times a day (BID) | ORAL | Status: DC
  Filled 2014-09-18 (×2): qty 1

## 2014-09-18 MED ORDER — ALPRAZOLAM 0.5 MG PO TABS
0.5000 mg | ORAL_TABLET | Freq: Three times a day (TID) | ORAL | Status: DC | PRN
  Administered 2014-09-18 – 2014-09-19 (×3): 1 mg via ORAL
  Filled 2014-09-18 (×3): qty 2

## 2014-09-18 MED ORDER — IPRATROPIUM BROMIDE 0.02 % IN SOLN
0.5000 mg | Freq: Four times a day (QID) | RESPIRATORY_TRACT | Status: DC
  Administered 2014-09-19 – 2014-09-21 (×10): 0.5 mg via RESPIRATORY_TRACT
  Filled 2014-09-18 (×10): qty 2.5

## 2014-09-18 MED ORDER — LORAZEPAM 2 MG/ML IJ SOLN
1.0000 mg | Freq: Once | INTRAMUSCULAR | Status: AC
  Administered 2014-09-18: 1 mg via INTRAVENOUS
  Filled 2014-09-18: qty 1

## 2014-09-18 MED ORDER — DEXTROSE 5 % IV SOLN
1.0000 g | Freq: Three times a day (TID) | INTRAVENOUS | Status: DC
  Administered 2014-09-18 – 2014-09-21 (×9): 1 g via INTRAVENOUS
  Filled 2014-09-18 (×12): qty 1

## 2014-09-18 MED ORDER — VANCOMYCIN HCL IN DEXTROSE 1-5 GM/200ML-% IV SOLN
1000.0000 mg | Freq: Once | INTRAVENOUS | Status: AC
  Administered 2014-09-18: 1000 mg via INTRAVENOUS
  Filled 2014-09-18: qty 200

## 2014-09-18 MED ORDER — DIGOXIN 250 MCG PO TABS
0.2500 mg | ORAL_TABLET | Freq: Once | ORAL | Status: AC
  Administered 2014-09-18: 0.25 mg via ORAL
  Filled 2014-09-18: qty 1

## 2014-09-18 MED ORDER — ALPRAZOLAM 0.5 MG PO TABS: 0.5000 mg | ORAL_TABLET | Freq: Three times a day (TID) | ORAL | Status: DC | PRN

## 2014-09-18 MED ORDER — DIGOXIN 125 MCG PO TABS
0.1250 mg | ORAL_TABLET | Freq: Every day | ORAL | Status: DC
  Administered 2014-09-19 – 2014-09-21 (×3): 0.125 mg via ORAL
  Filled 2014-09-18 (×3): qty 1

## 2014-09-18 MED ORDER — IPRATROPIUM BROMIDE 0.02 % IN SOLN
0.5000 mg | Freq: Four times a day (QID) | RESPIRATORY_TRACT | Status: DC
  Administered 2014-09-18 (×2): 0.5 mg via RESPIRATORY_TRACT
  Filled 2014-09-18 (×2): qty 2.5

## 2014-09-18 MED ORDER — SODIUM CHLORIDE 0.9 % IJ SOLN
10.0000 mL | Freq: Two times a day (BID) | INTRAMUSCULAR | Status: DC
  Administered 2014-09-19: 10 mL
  Administered 2014-09-20: 20 mL
  Administered 2014-09-20: 10 mL

## 2014-09-18 MED ORDER — SODIUM CHLORIDE 0.9 % IJ SOLN
10.0000 mL | INTRAMUSCULAR | Status: DC | PRN
  Administered 2014-09-21: 20 mL
  Filled 2014-09-18: qty 40

## 2014-09-18 MED ORDER — VANCOMYCIN HCL IN DEXTROSE 750-5 MG/150ML-% IV SOLN
750.0000 mg | Freq: Two times a day (BID) | INTRAVENOUS | Status: DC
  Administered 2014-09-19 – 2014-09-21 (×5): 750 mg via INTRAVENOUS
  Filled 2014-09-18 (×7): qty 150

## 2014-09-18 NOTE — Progress Notes (Signed)
[place] TEAM 1 - Stepdown/ICU TEAM Progress Note  Linda Donovan UXN:[id number] DOB: 04-27-1956 DOA: 09/12/2014 PCP: Pcp Not In System  Admit HPI / Brief Narrative: 59 y.o. female with h/o NSCLC, and COPD on 2L home O2 at baseline who presented to the ED 4/7 c/o 1 day of SOB. The prior week she began radiation therapy for her NSCLC (RUL).  In the ED the patient was found to have right upper lobe consolidation with acute on chronic hypoxic respiratory failure, and findings c/w radiation pneumonitis. Patient was placed on IV antibiotics and Solu-Medrol.  Events Since Admission: Troponins were positive at the time of admission, and trended up to 0.72.  Cardiology was consulted. TTE showed EF of 25-30% with severe pulmonary hypertension, PA pressure 60 mmHg. 2-D echo in 8/15 had shown EF of 55-60%. Cardiac MRI showed severely decreased left ventricular systolic function (EF 25%), diffuse hypokinesis, moderately reduced right-sided systolic function, RVEF 42% with mild-to-moderate tricuspid regurgitation, and significant pulmonary hypertension.  The patient was persistnetly tachypneic, and borderline hypotensive with tachycardia.  On 4/12 the pt was transferred to [facility] for consult w the Advanced Heart Failure Team.  HPI/Subjective: The patient was resting comfortably at the time of my visit and I chose to not wake her up.  The patient's family was at the bedside and we spoke at length.  They expressed significant frustration that "nothing was being done."  I explained to them the medications were being titrated but that short of this there were no other procedural interventions at this time that would stand to benefit the patient and her current situation.  I reiterated the grave nature of her significant medical illnesses but the family appears to have not yet come to terms with her almost certain terminal state.  Assessment/Plan:  Acute on chronic hypoxic respiratory  failure Multifactorial - Pulmonary now following with little else to be offered beyond current therapies  RLL HCAPNA v/s radiation pneumonitis  As per Pulmonary  Acute on chronic severe systolic CHF ?due to chemotx, v/s ischemic - ongoing care per CHF Team  Severe COPD exacerbation, acute on chronic hypoxic respiratory failure No evidence of active wheeze on exam today - as per Pulmonary  Sinus Tachycardia Due to above as well as anxiety - dose with anxiolytic when necessary  RUL Non-small cell lung cancer On XRT, supposed to begin chemotherapy next week, oncology following, Dr. Julien Nordmann - prior chemo and XRT 2014 -   Code Status: FULL Family Communication: [place] with husband at bedside at length Disposition Plan: SDU  Consultants: Heart Failure Team Oncology PCCM  Antibiotics: Rocephin 4/7 > 4/12 Cefepime 4/13 > Vancomycin 4/13 >  DVT prophylaxis: lovenox  Objective: Blood pressure 113/73, pulse 113, temperature 97.9 F (36.6 C), temperature source Oral, resp. rate 19, height 5\' 3"  (1.6 m), weight 59.9 kg (132 lb 0.9 oz), SpO2 99 %.  Intake/Output Summary (Last 24 hours) at 09/18/14 1301 Last data filed at 09/18/14 1100  Gross per 24 hour  Intake    440 ml  Output   1326 ml  Net   -886 ml   Exam: General: No acute respiratory distress at present while sleeping Lungs: Poor air movement diffusely but with no active wheeze Cardiovascular: Tachycardic but regular without appreciable murmur gallop or rub Abdomen: Nontender, nondistended, soft, bowel sounds positive, no rebound, no ascites, no appreciable mass Extremities: No significant cyanosis, clubbing, or edema bilateral lower extremities  Data Reviewed: Basic Metabolic Panel:  Recent Labs Lab  09/14/14 0540 09/14/14 0938 09/15/14 0430 09/16/14 0520 09/17/14 0528 09/18/14 0219  NA  --  134* 139 139 139 139  K  --  3.7 4.2 4.1 4.2 4.3  CL  --  98 98 96 94* 93*  CO2  --  29 33* 36* 37* 38*  GLUCOSE  --   92 128* 139* 120* 116*  BUN  --  6 6 13 19 16   CREATININE  --  0.47* 0.44* 0.56 0.57 0.51  CALCIUM  --  8.3* 8.9 8.9 8.9 8.8  MG 1.5  --   --   --   --   --     Liver Function Tests:  Recent Labs Lab 09/12/14 1444  AST 23  ALT 21  ALKPHOS 110  BILITOT 0.29  PROT 7.2  ALBUMIN 3.0*   CBC:  Recent Labs Lab 09/12/14 1444 09/12/14 1815 09/13/14 0253 09/14/14 0607 09/15/14 0430 09/16/14 0520  WBC 4.4 4.9 7.6 6.7 6.1 14.2*  NEUTROABS 4.1 4.6  --   --   --   --   HGB 12.2 14.4 10.5* 10.6* 11.5* 12.0  HCT 36.6 42.9 31.2* 32.0* 35.3* 37.2  MCV 98.9 99.1 99.0 101.6* 101.1* 101.9*  PLT 586* 511* 483* 560* 595* 651*    Cardiac Enzymes:  Recent Labs Lab 09/12/14 2108 09/13/14 0233 09/13/14 0845  TROPONINI 0.53* 0.72* 0.56*     Recent Results (from the past 240 hour(s))  Blood culture (routine x 2)     Status: None (Preliminary result)   Collection Time: 09/12/14  9:08 PM  Result Value Ref Range Status   Specimen Description BLOOD R FOREARM  Final   Special Requests BOTTLES DRAWN AEROBIC AND ANAEROBIC 5CC  Final   Culture   Final           BLOOD CULTURE RECEIVED NO GROWTH TO DATE CULTURE WILL BE HELD FOR 5 DAYS BEFORE ISSUING A FINAL NEGATIVE REPORT Performed at [facility]    Report Status PENDING  Incomplete  Blood culture (routine x 2)     Status: None (Preliminary result)   Collection Time: 09/12/14  9:15 PM  Result Value Ref Range Status   Specimen Description BLOOD RIGHT ARM  Final   Special Requests BOTTLES DRAWN AEROBIC AND ANAEROBIC 4CC  Final   Culture   Final           BLOOD CULTURE RECEIVED NO GROWTH TO DATE CULTURE WILL BE HELD FOR 5 DAYS BEFORE ISSUING A FINAL NEGATIVE REPORT Performed at [facility]    Report Status PENDING  Incomplete  Culture, sputum-assessment     Status: None   Collection Time: 09/13/14  7:27 AM  Result Value Ref Range Status   Specimen Description SPUTUM  Final   Special Requests NONE  Final   Sputum  evaluation   Final    THIS SPECIMEN IS ACCEPTABLE. RESPIRATORY CULTURE REPORT TO FOLLOW.   Report Status 09/13/2014 FINAL  Final  Culture, respiratory (NON-Expectorated)     Status: None   Collection Time: 09/13/14  7:27 AM  Result Value Ref Range Status   Specimen Description SPUTUM  Final   Special Requests NONE  Final   Gram Stain   Final    RARE WBC PRESENT, PREDOMINANTLY PMN RARE SQUAMOUS EPITHELIAL CELLS PRESENT FEW GRAM POSITIVE COCCI IN PAIRS IN CHAINS IN CLUSTERS RARE GRAM POSITIVE RODS RARE GRAM NEGATIVE RODS    Culture   Final    NORMAL OROPHARYNGEAL FLORA Performed at [facility]    Report Status 09/15/2014 FINAL  Final  MRSA PCR Screening     Status: None   Collection Time: 09/17/14  1:06 PM  Result Value Ref Range Status   MRSA by PCR NEGATIVE NEGATIVE Final    Comment:        The GeneXpert MRSA Assay (FDA approved for NASAL specimens only), is one component of a comprehensive MRSA colonization surveillance program. It is not intended to diagnose MRSA infection nor to guide or monitor treatment for MRSA infections.      Studies:   Recent x-ray studies have been reviewed in detail by the Attending Physician  Scheduled Meds:  Scheduled Meds: . antiseptic oral rinse  7 mL Mouth Rinse BID  . aspirin  81 mg Oral Daily  . cefTRIAXone (ROCEPHIN)  IV  1 g Intravenous Q24H  . [START ON 09/19/2014] digoxin  0.125 mg Oral Daily  . docusate sodium  100 mg Oral BID  . enoxaparin (LOVENOX) injection  40 mg Subcutaneous Q24H  . folic acid  1 mg Oral Daily  . ipratropium  0.5 mg Nebulization Q6H  . levalbuterol  0.63 mg Nebulization QID  . LORazepam  1 mg Intravenous Once  . magnesium oxide  400 mg Oral BID  . methylPREDNISolone (SOLU-MEDROL) injection  60 mg Intravenous Q12H  . spironolactone  12.5 mg Oral Daily  . sucralfate  1 g Oral TID WC & HS  . traZODone  50 mg Oral QHS    Time spent on care of this patient: 35 mins   MCCLUNG,JEFFREY T , MD    [facility] Office  [phone] Pager - Text Page per Shea Evans as per below:  On-Call/Text Page:      [email or web address]      password [email or web address]  If 7PM-7AM, please contact night-coverage [email or web address] Password [email or web address] 09/18/2014, 1:01 PM   LOS: 6 days

## 2014-09-18 NOTE — Progress Notes (Signed)
SUBJECTIVE:   [name] 59 yo female with PMH lung cancer, prior pericardial window for effusion for evaluation of dyspnea and elevated troponin. Patient is undergoing radiation therapy and chemotherapy for recurrent lung cancer. Following radiation yesterday she developed worsening dyspnea. CXR suggestive of RLL pneumonia and RUL with invasive carcinoma. Started on antibiotics.    Left ventricular systolic dysfunction, new since August 2015 possibly secondary to chemotherapy>  EF by MRI was 26% and RVF EF 36%. Mild to moderate TR with mild Pulmonary HTN also noted.  09/13/2014  ECHO EF 25-30% Mild RV HK. RVSP 60  SOB at rest. Denies CP.    OBJECTIVE:   Vitals:   Filed Vitals:   09/17/14 2313 09/17/14 2315 09/18/14 0320 09/18/14 0343  BP:  109/57 118/80   Pulse:  103 124   Temp: 98.1 F (36.7 C)  98.5 F (36.9 C)   TempSrc: Oral  Oral   Resp:  19 20   Height:      Weight:    132 lb 0.9 oz (59.9 kg)  SpO2:  100% 98%    I&O's:    Intake/Output Summary (Last 24 hours) at 09/18/14 0732 Last data filed at 09/18/14 0600  Gross per 24 hour  Intake    290 ml  Output   1326 ml  Net  -1036 ml   TELEMETRY: 130-140s.    PHYSICAL EXAM General: Ill appearing. Sitting up in bed to breathe. Tachypneic  Head: Eyes PERRLA, No xanthomas.   Normal cephalic and atramatic  Lungs:  Poor air movement. Rhonchi on RUL. Upper airway gurgling Heart:   Distant. Tachy regular. No obvious s3  Abdomen: Bowel sounds are positive, abdomen soft and non-tender without masses  Extremities:   No clubbing, cyanosis or edema.  DP +1 Neuro: Alert and oriented X 3. Psych:  Good affect, responds appropriately   LABS: Basic Metabolic Panel:  Recent Labs  09/17/14 0528 09/18/14 0219  NA 139 139  K 4.2 4.3  CL 94* 93*  CO2 37* 38*  GLUCOSE 120* 116*  BUN 19 16  CREATININE 0.57 0.51  CALCIUM 8.9 8.8   Liver Function Tests: No results for input(s): AST, ALT, ALKPHOS, BILITOT, PROT, ALBUMIN in  the last 72 hours. No results for input(s): LIPASE, AMYLASE in the last 72 hours. CBC:  Recent Labs  09/16/14 0520  WBC 14.2*  HGB 12.0  HCT 37.2  MCV 101.9*  PLT 651*   Cardiac Enzymes: No results for input(s): CKTOTAL, CKMB, CKMBINDEX, TROPONINI in the last 72 hours. BNP: Invalid input(s): POCBNP D-Dimer: No results for input(s): DDIMER in the last 72 hours. Hemoglobin A1C: No results for input(s): HGBA1C in the last 72 hours. Fasting Lipid Panel: No results for input(s): CHOL, HDL, LDLCALC, TRIG, CHOLHDL, LDLDIRECT in the last 72 hours. Thyroid Function Tests: No results for input(s): TSH, T4TOTAL, T3FREE, THYROIDAB in the last 72 hours.  Invalid input(s): FREET3 Anemia Panel: No results for input(s): VITAMINB12, FOLATE, FERRITIN, TIBC, IRON, RETICCTPCT in the last 72 hours. Coag Panel:   Lab Results  Component Value Date   INR 1.01 01/03/2014   INR 0.89 09/04/2012    RADIOLOGY: Ct Angio Chest Pe W/cm &/or Wo Cm  09/12/2014   CLINICAL DATA:  History of recurrent lung cancer with increasing shortness of breath. Patient is currently being treated for potential pneumonia.  EXAM: CT ANGIOGRAPHY CHEST WITH CONTRAST  TECHNIQUE: Multidetector CT imaging of the chest was performed using the standard protocol during bolus administration of  intravenous contrast. Multiplanar CT image reconstructions and MIPs were obtained to evaluate the vascular anatomy.  CONTRAST:  55mL OMNIPAQUE IOHEXOL 350 MG/ML SOLN  COMPARISON:  July 17, 2014  FINDINGS: There is no pulmonary embolus. There is atherosclerosis of the aorta without aortic dissection or aneurysmal dilatation. The heart size is normal. There is no pericardial effusion. Mediastinal and subcarinal lymph nodes are unchanged.  Ill-defined masslike opacity is seen in the posterior right upper lung field with involvement of the right hilum unchanged compared to prior exam. There is interval developed patchy airspace opacity involving the  right upper lobe, differential diagnosis includes interval developed pneumonia or radiation pneumonitis if there has been interval radiation since February 2016. There is interval increase of small to moderate right pleural effusion. The left lung is unchanged with emphysematous changes.  The visualized upper abdominal structures are unremarkable. There is a small hiatal hernia. No focal lytic or blastic lesion is identified within the visualized bones.  Review of the MIP images confirms the above findings.  IMPRESSION: Ill-defined masslike opacity identified in the right upper lung field with involvement of the right hilum consistent with patient's known recurrent lung cancer without changed compared to prior CT.  Interval developed patchy airspace opacity involving the right upper lobe, differential diagnosis includes interval foot bowel pneumonia or radiation pneumonitis if there has been interval radiation since February 2016.  Interval increase small to moderate right pleural effusion.   Electronically Signed   By: Abelardo Diesel M.D.   On: 09/12/2014 20:16   Dg Chest Port 1 View  09/16/2014   CLINICAL DATA:  59 year old female with a history of pneumonia and shortness of breath. Known lung carcinoma.  EXAM: PORTABLE CHEST - 1 VIEW  COMPARISON:  CT chest 09/12/2014, chest x-ray 09/12/2014, PET-CT 08/12/2014, chest x-ray 08/01/2014  FINDINGS: Cardiac size unchanged.  Contour of the mediastinum is similar, with obscuration of the right mediastinum secondary to right upper lobe/hilar consolidation/mass, characterized previously on the CT and PET-CT.  Elevation of the minor fissure.  Improved appearance of airspace opacities and interlobular septal thickening of the right lower lobe compared to the prior chest x-ray.  Right pleural parenchymal opacity at the apex persists as well as airspace disease of the right upper lobe.  No left-sided consolidative airspace disease. Similar appearance of the interstitial  opacities at the base.  Persistent blunting of the right costophrenic angle. No large with left-sided pleural effusion.  No displaced fracture.  IMPRESSION: Improved airspace and interstitial opacities of the right lower lung, suggesting improved pneumonia/ pneumonitis.  Persistent right hilar and right upper lobe consolidative changes compatible with the patient's known invasive carcinoma. These changes were better characterized on recent CT chest.  Trace right-sided pleural effusion.  Signed,  Dulcy Fanny. Earleen Newport, DO  [facility]  [facility]   Electronically Signed   By: Corrie Mckusick D.O.   On: 09/16/2014 14:05   Dg Chest Port 1 View  09/12/2014   CLINICAL DATA:  Short of breath for 2 days. Chest congestion and cough for 1 month. Lung cancer with recurrence. COPD and asthma.  EXAM: PORTABLE CHEST - 1 VIEW  COMPARISON:  08/01/2014 and PET of 08/12/2014.  FINDINGS: Numerous leads and wires project over the chest. Tracheal deviation to the right with mediastinal shift due to volume loss within the right hemithorax. Mild cardiomegaly. Trace right pleural fluid or thickening, blunting the right costophrenic angle. No pneumothorax. Right apical pleural thickening. Right upper lobe masslike opacity  is similar to slightly decreased. There is new right mid and lower lung interstitial and airspace disease, especially laterally. Clear left lung with interstitial thickening, lower lobe predominant.  IMPRESSION: Slight improvement in right upper lobe/ apical aeration. This was consistent with recurrent tumor on the 08/12/2014 PET. Development of interstitial and airspace disease in the right mid and lower lung. Considerations include radiation pneumonitis (if there is been interval radiation since 08/01/2014) or infection. Lymphangitic tumor spread is felt less likely, given time course.   Electronically Signed   By: Abigail Miyamoto M.D.   On: 09/12/2014 19:00   Mr Card Morphology  W/o Cm  09/16/2014   CLINICAL DATA:  59 year old female  EXAM: CARDIAC MRI  TECHNIQUE: The patient was scanned on a 1.5 Tesla GE magnet. A dedicated cardiac coil was used. Functional imaging was done using Fiesta sequences. 2,3, and 4 chamber views were done to assess for RWMA's. Modified Simpson's rule using a short axis stack was used to calculate an ejection fraction on a dedicated work Conservation officer, nature. The patient received no contrast. After 10 minutes inversion recovery sequences were used to assess for infiltration and scar tissue.  CONTRAST:  None.  FINDINGS: This study is of limited quality secondary to tachycardia, tachypnea and patient's inability to hold breath.  1. Normal left ventricular size, thickness and severely decreased systolic function (LVEF = 26%). Systolic function might be underestimated secondary to significant tachycardia during acquisition.  There is diffuse hypokinesis.  LVEDV:  152 ml  LVESV:  112 ml  SV:  40 ml  CO:  3.6 L/minute  2. Normal ventricular size, thickness and mildly to moderately reduced systolic function (RVEF = 34%).  3.  Normal biatrial size.  4.  Mild mitral and mild to moderate tricuspid regurgitation.  5.  Normal size of the aortic root and ascending aorta.  6. Moderately dilated pulmonary artery (38 mm) suggestive of significant pulmonary hypertension.  IMPRESSION: 1. Normal left ventricular size, thickness and severely decreased systolic function (LVEF = 26%). Systolic function might be underestimated secondary to significant tachycardia during acquisition.  There is diffuse hypokinesis.  2. Normal ventricular size, thickness and mildly to moderately reduced systolic function (RVEF = 34%).  3.  Normal biatrial size.  4.  Mild mitral and mild to moderate tricuspid regurgitation.  5.  Normal size of the aortic root and ascending aorta.  6. Moderately dilated pulmonary artery (38 mm) suggestive of significant pulmonary hypertension.  Ena Dawley    Electronically Signed   By: Ena Dawley   On: 09/16/2014 15:37   ASSESSMENT AND PLAN 1. A/C Systolic HF-  Left ventricular systolic dysfunction, new since August 2015 . possibly secondary to chemotherapy>  EF by MRI was 26% and RVF EF 36%.   Mild to moderate TR with mild Pulmonary HTN also noted.  Remains SOB at rest but not related to volume overload.  - Stop b-blocker for now. Start digoxin -Does not appear volume overloaded.  Was not on lasix prior to admit. Stop po lasix. Start 12.5 mg spiro daily.  - Hold off on ace  - Renal function ok.  2. Sinus tachycardia - 130s . EKG now. Appears sinus tach. Does not break with carotid massage.  3. COPD and non-small cell lung cancer and hypoxic respiratory failure secondary to COPD exacerbation and acute systolic CHF 4. Mildly elevated troponin . most likely secondary to COPD exacerbation with acute CHF and tachycardia. 5. QT prolongation possibly secondary to Zithromax  6. Lung Cancer, recurrent - non small cell carcinoma.  7. Acute on chronic respiratory failure 8. Radiation pneumonitis  CLEGG,AMY, NP  09/18/2014  7:32 AM  Patient seen and examined with Darrick Grinder, NP. We discussed all aspects of the encounter. I agree with the assessment and plan as stated above.   Very difficult case. 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 Now complicated by severe LV dysfunction and marked tachycardia.  Unclear to me how much of her current decompensation is related to her cardiac issues vs her pulmonary status. I agree that LV dysfunction likely due to chemo but ischemic CM also a possibility. I reviewed chest CT and don't see significant CAD so most likely non-ischemic. Volume stats seems ok. We will stop b-blocker.  Start digoxin and spiro. Place PICC line to measure CVP and co-ox. Will ask CCM to see as well.   I discussed case with her oncologist, Dr. Earlie Server who felt her lifespan would be in  terms of months if she recovers from this. I discussed Hospice Care with her and her family and for now that want to proceed with active Rx.   The patient is critically ill with multiple organ systems failure and requires high complexity decision making for assessment and support, frequent evaluation and titration of therapies, application of advanced monitoring technologies and extensive interpretation of multiple databases.   Critical Care Time personally devoted to patient care services described in this note is 45 Minutes.  Daniel Bensimhon,MD 8:41 AM

## 2014-09-18 NOTE — Progress Notes (Signed)
ANTIBIOTIC CONSULT NOTE - INITIAL  Pharmacy Consult for Vancomycin and Cefepime Indication: HCAP  Allergies  Allergen Reactions  . Clinoril [Sulindac] Hives  . Morphine Itching    Patient Measurements: Height: 5\' 3"  (160 cm) Weight: 132 lb 0.9 oz (59.9 kg) IBW/kg (Calculated) : 52.4  Vital Signs: Temp: 97.9 F (36.6 C) (04/13 1242) Temp Source: Oral (04/13 1242) BP: 113/73 mmHg (04/13 1242) Pulse Rate: 113 (04/13 1100) Intake/Output from previous day: 04/12 0701 - 04/13 0700 In: 290 [P.O.:240; IV Piggyback:50] Out: 1326 [Urine:1325; Stool:1] Intake/Output from this shift: Total I/O In: 150 [P.O.:150] Out: -   Labs:  Recent Labs  09/16/14 0520 09/17/14 0528 09/18/14 0219  WBC 14.2*  --   --   HGB 12.0  --   --   PLT 651*  --   --   CREATININE 0.56 0.57 0.51   Estimated Creatinine Clearance: 61.9 mL/min (by C-G formula based on Cr of 0.51).  Medical History: Past Medical History  Diagnosis Date  . COPD (chronic obstructive pulmonary disease)   . ADD (attention deficit disorder)   . Hx of radiation therapy 09/28/12- 11/17/12    RU lung mass, mediastinum chest, 63 gray 35 fx  . Pneumonia   . Arthritis   . Non-small cell lung cancer 09/18/2012    RUL  . Asthma    Assessment: 55yof currently being treated for CAP versus radiation pneumonitis with ongoing dyspnea. CCM consulted and wants to broaden antibiotics to vancomycin and cefepime to cover for HCAP given active cancer undergoing treatment. Planning for 5 days of therapy. Renal function stable.  4/13>>vancomycin>> 4/13>>cefepime>> 4/8 >>ceftriaxone >>4/13 4/8 >>azithromycin >> 4/10 4/7 >> tamiflu >> 4/8  4/7 strep Ag: neg 4/7 Legionella: neg 4/8 blood: ngtd 4/8 sputum: normal flora 4/8 flu panel: neg 4/7 HIV Ab: NR  Goal of Therapy:  Vancomycin trough level 15-20 mcg/ml  Plan:  1) Vancomycin 1g IV x 1 then 750mg  IV q12 2) Cefepime 1g IV q8 3) Follow renal function, cultures, LOT, level if  needed  Deboraha Sprang 09/18/2014,1:34 PM

## 2014-09-18 NOTE — Consult Note (Signed)
Name: Linda Donovan MRN: [id number] DOB: 1955/06/19    ADMISSION DATE:  09/12/2014 CONSULTATION DATE:  4/13   REFERRING MD :  Bensimhon   CHIEF COMPLAINT:  Lung ca, COPD   BRIEF PATIENT DESCRIPTION:  59yo female former smoker with hx COPD on 2L home O2, recurrent stage IIIa NSCLC with previous chemo/XRT 2014, currently undergoing additional chemotherapy/XRT (only recent dose chemo on 08/20/14).  Presented 4/7 with 1 day hx worsening SOB and cough and was admitted by [facility] with PNA v radiation pneumonitis and started on abx.  Also found to have severe LV dysfunction r/t chemo v ischemic cardiomyopathy.  PCCM consulted 4/13 r/t ongoing dyspnea.   SIGNIFICANT EVENTS      STUDIES:  2D echo 4/8>>> EF 25-30%, severely reduced LV function, PA pressure 40mmHg, mild to mod TR   HISTORY OF PRESENT ILLNESS:  59yo female former smoker with hx COPD on 2L home O2, recurrent stage IIIa NSCLC with previous chemo/XRT 2014, currently undergoing additional chemotherapy/XRT (only recent dose chemo on 08/20/14).  Presented 4/7 with 1 day hx worsening SOB and cough and was admitted by [facility] with PNA v radiation pneumonitis and started on abx.  Also found to have severe LV dysfunction r/t chemo v ischemic cardiomyopathy.  PCCM consulted 4/13 r/t ongoing dyspnea.   PAST MEDICAL HISTORY :   has a past medical history of COPD (chronic obstructive pulmonary disease); ADD (attention deficit disorder); radiation therapy (09/28/12- 11/17/12); Pneumonia; Arthritis; Non-small cell lung cancer (09/18/2012); and Asthma.  has past surgical history that includes Total abdominal hysterectomy; Shoulder arthroscopy; Video bronchoscopy (Bilateral, 07/25/2012); Lump Removed from Breasr Right (2003); Endobronchial ultrasound (Bilateral, 09/04/2012); Breast surgery; Subxyphoid pericardial window (N/A, 01/04/2014); and Intraoprative transesophageal echocardiogram (N/A, 01/04/2014). Prior to Admission medications   Medication Sig Start Date  End Date Taking? Authorizing Provider  albuterol (PROVENTIL HFA;VENTOLIN HFA) 108 (90 BASE) MCG/ACT inhaler Inhale 2 puffs into the lungs every 6 (six) hours as needed for wheezing. 08/01/14  Yes Tammy S Parrett, NP  albuterol (PROVENTIL) (2.5 MG/3ML) 0.083% nebulizer solution Take 2.5 mg by nebulization every 6 (six) hours as needed for wheezing or shortness of breath.   Yes Historical Provider, MD  amphetamine-dextroamphetamine (ADDERALL) 20 MG tablet Take 20 mg by mouth daily before breakfast.    Yes Historical Provider, MD  aspirin 81 MG tablet Take 81 mg by mouth daily.   Yes Historical Provider, MD  budesonide-formoterol (SYMBICORT) 160-4.5 MCG/ACT inhaler Inhale 2 puffs into the lungs 2 (two) times daily. 12/12/13  Yes Collene Gobble, MD  calcium-vitamin D (OSCAL WITH D) 250-125 MG-UNIT per tablet Take 1 tablet by mouth daily.   Yes Historical Provider, MD  chlorpheniramine-HYDROcodone (TUSSIONEX) 10-8 MG/5ML LQCR Take 5 mLs by mouth every 12 (twelve) hours as needed for cough. 09/10/14  Yes Gery Pray, MD  dexamethasone (DECADRON) 4 MG tablet 4 mg by mouth twice a day the day before, day of and day after the chemotherapy every 3 weeks. 08/13/14  Yes Curt Bears, MD  folic acid (FOLVITE) 1 MG tablet Take 1 tablet (1 mg total) by mouth daily. 08/13/14  Yes Curt Bears, MD  magnesium oxide (MAG-OX) 400 MG tablet Take 1 tablet (400 mg total) by mouth daily. 03/12/13  Yes Brand Males, MD  metoprolol succinate (TOPROL-XL) 25 MG 24 hr tablet Take 1 tablet (25 mg total) by mouth daily. 01/09/14  Yes Brett Canales, PA-C  Multiple Vitamins-Minerals (MULTIVITAMIN WITH MINERALS) tablet Take 1 tablet by mouth daily.  Yes Historical Provider, MD  oxyCODONE-acetaminophen (PERCOCET/ROXICET) 5-325 MG per tablet Take 1 tablet by mouth every 4 (four) hours as needed for severe pain. 08/13/14  Yes Curt Bears, MD  Potassium 75 MG TABS Take 1 tablet by mouth daily.   Yes Historical Provider, MD  sucralfate  (CARAFATE) 1 G tablet Take 1 tablet (1 g total) by mouth 4 (four) times daily -  with meals and at bedtime. Dissolve in 10 ml of water prior to taking 09/10/14  Yes Gery Pray, MD  traZODone (DESYREL) 50 MG tablet Take 50 mg by mouth at bedtime.   Yes Historical Provider, MD  zinc gluconate 50 MG tablet Take 50 mg by mouth daily.   Yes Historical Provider, MD  OXYGEN Inhale 2 L into the lungs continuous.    Historical Provider, MD  prochlorperazine (COMPAZINE) 10 MG tablet Take 1 tablet (10 mg total) by mouth every 6 (six) hours as needed for nausea or vomiting. 08/13/14   Curt Bears, MD   Allergies  Allergen Reactions  . Clinoril [Sulindac] Hives  . Morphine Itching    FAMILY HISTORY:  family history includes COPD in her paternal uncle; Cancer in her maternal grandfather and paternal grandmother; Heart disease in her maternal grandmother and mother; Prostate cancer in her maternal grandfather. SOCIAL HISTORY:  reports that she quit smoking about 18 years ago. Her smoking use included Cigarettes. She has a 20 pack-year smoking history. She has never used smokeless tobacco. She reports that she drinks about 0.6 oz of alcohol per week. She reports that she does not use illicit drugs.  REVIEW OF SYSTEMS:   As per HPI - All other systems reviewed and were neg.    SUBJECTIVE:  C/o SOB, trouble catching her breath, anxiety.   VITAL SIGNS: Temp:  [97 F (36.1 C)-98.5 F (36.9 C)] 97.6 F (36.4 C) (04/13 0833) Pulse Rate:  [103-145] 113 (04/13 1100) Resp:  [17-42] 42 (04/13 1100) BP: (108-151)/(57-97) 151/97 mmHg (04/13 0833) SpO2:  [97 %-100 %] 99 % (04/13 1100) Weight:  [132 lb 0.9 oz (59.9 kg)-132 lb 7.9 oz (60.1 kg)] 132 lb 0.9 oz (59.9 kg) (04/13 0343)  PHYSICAL EXAMINATION: General:  Chronically ill appearing female, mild distress  Neuro:  Awake, alert, appropriate, non focal, MAE  HEENT:  Mm moist, no JVD  Cardiovascular:  s1s2 tachycardic, NSR 140's  Lungs:  resps even,  mildly labored, diffuse crackles, coarse rhonchi  Abdomen:  Round, soft, +bs, non tender,  Musculoskeletal:  Warm and dry, no edema    Recent Labs Lab 09/16/14 0520 09/17/14 0528 09/18/14 0219  NA 139 139 139  K 4.1 4.2 4.3  CL 96 94* 93*  CO2 36* 37* 38*  BUN 13 19 16   CREATININE 0.56 0.57 0.51  GLUCOSE 139* 120* 116*    Recent Labs Lab 09/14/14 0607 09/15/14 0430 09/16/14 0520  HGB 10.6* 11.5* 12.0  HCT 32.0* 35.3* 37.2  WBC 6.7 6.1 14.2*  PLT 560* 595* 651*   Dg Chest Port 1 View  09/16/2014   CLINICAL DATA:  59 year old female with a history of pneumonia and shortness of breath. Known lung carcinoma.  EXAM: PORTABLE CHEST - 1 VIEW  COMPARISON:  CT chest 09/12/2014, chest x-ray 09/12/2014, PET-CT 08/12/2014, chest x-ray 08/01/2014  FINDINGS: Cardiac size unchanged.  Contour of the mediastinum is similar, with obscuration of the right mediastinum secondary to right upper lobe/hilar consolidation/mass, characterized previously on the CT and PET-CT.  Elevation of the minor fissure.  Improved appearance  of airspace opacities and interlobular septal thickening of the right lower lobe compared to the prior chest x-ray.  Right pleural parenchymal opacity at the apex persists as well as airspace disease of the right upper lobe.  No left-sided consolidative airspace disease. Similar appearance of the interstitial opacities at the base.  Persistent blunting of the right costophrenic angle. No large with left-sided pleural effusion.  No displaced fracture.  IMPRESSION: Improved airspace and interstitial opacities of the right lower lung, suggesting improved pneumonia/ pneumonitis.  Persistent right hilar and right upper lobe consolidative changes compatible with the patient's known invasive carcinoma. These changes were better characterized on recent CT chest.  Trace right-sided pleural effusion.  Signed,  Dulcy Fanny. Earleen Newport, DO  [facility]  [facility]  Radiology   Electronically Signed   By: Corrie Mckusick D.O.   On: 09/16/2014 14:05    ASSESSMENT / PLAN:  Acute hypoxic respiratory failure - multifactorial r/t severe COPD, recurrent lung ca now with RLL PNA v radiation pneumonitis (known RUL tumor) now all c/b severe LV dysfunction and marked tachycardia.   O2 dependent +/- AECOPD  Recurrent NSCLC - RUL Radiation pneumonitis  Acute on chronic sCHF - L ventricular systolic dysfunction ?r/t chemo  Sinus tachycardia  PLAN -  Supplemental O2 as needed  Cont solumedrol  BD's - change to duonebs, prn xopenex for now  Cough suppression  Check CVP  Cont digoxin, aldactone per cards  Continue empiric abx for ?CAP  F/u CXR now  Likely need to begin discussions regarding goals of care, EOL, code status given multiple serious issues, consider palliative care     Nickolas Madrid, NP 09/18/2014  11:37 AM Pager: [phone] or [phone]   Attending:  I have seen and examined the patient with nurse practitioner/resident and agree with the note above.   Chart reviewed, patient examined independently and history taken.  Admitted on 4/7 for worsening dyspnea, found to have new left heart failure on background of severe COPD and non-small cell lung cancer.  Ms. She is a bit anxious right now.   On exam there are crackles in the Right base primarily, no wheezing, good respiratory effort.  She is quit tachycardic, no clear JVD, very anxious.  Images from the most recent CT chest (April 2016) and 07/17/2014 reviewed> The right upper lobe mass seems stable on the April film compared to 07/2014 but the effusion is a bit bigger and there is a large component of airspace disease in the right lower lobe. VERY SEVERE EMPHYSEMA  Impression/Plan: 1) Acute on chronic hypoxemic respiratory failure> multiple possible etiologies here given new CHF, Pneumonia, and VERY SEVERE Emphysema.  Her prognosis from this acute illness alone is quite poor considering  her severe comorbid illnesses.  Agree with diuresis, will change Antibiotics (see below) and will obtain CXR today to see if there is a new infiltrate or pulm edema. Would continue steroids in case this is radiation pneumonitis. 2) Pneumonia> really HCAP given active cancer with treatment, will change to cefepime and vanc, plan 5 days total 3) Severe COPD > Previously seen by Dr. 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   5) Acute CHF> per cardiology 6) Pleural effusion > given the size I don't think it is contributing significantly to her dyspnea; this may be malignant and as of yet we  don't know because it has not been sampled, however I'm not sure it would change the current management strategy (XRT/chemo) 7) Anxiety> add increased dose ativan  Overall prognosis poor.  Family discussing goals of care.  She currently is Full Code and wants "intubation short term".  I think they are processing the severity of all this.  Will treat as outlined above.  Roselie Awkward, MD [place] [facility] Pager: [phone] Cell: [phone] If no response, call [phone]

## 2014-09-18 NOTE — Progress Notes (Signed)
Pt states physician said foley would stay in one more day. Consulted Amy Clegg NP. Was advised to keep foley in until coox is able to be obtained and reassess afterwards. Will continue to monitor need for foley. Also made Physician aware pt HR is remaining in 140s. Will continue to monitor closely.

## 2014-09-18 NOTE — Progress Notes (Signed)
eLink Physician-Brief Progress Note Patient Name: Linda Donovan DOB: 1955-10-23 MRN: [id number]   Date of Service  09/18/2014  HPI/Events of Note  Hx of heart failure. Patient c/o increased SOB s/p Abx IV. Nurse states patient sounds "gurgly".  eICU Interventions  Will order: 1. Lasix 40 mg IV now.      Intervention Category Intermediate Interventions: Respiratory distress - evaluation and management  Bertrand Vowels Eugene 09/18/2014, 5:02 PM

## 2014-09-18 NOTE — Progress Notes (Signed)
Peripherally Inserted Central Catheter/Midline Placement  The IV Nurse has discussed with the patient and/or persons authorized to consent for the patient, the purpose of this procedure and the potential benefits and risks involved with this procedure.  The benefits include less needle sticks, lab draws from the catheter and patient may be discharged home with the catheter.  Risks include, but not limited to, infection, bleeding, blood clot (thrombus formation), and puncture of an artery; nerve damage and irregular heat beat.  Alternatives to this procedure were also discussed.  PICC/Midline Placement Documentation        Darlyn Read 09/18/2014, 6:59 PM

## 2014-09-19 ENCOUNTER — Ambulatory Visit: Payer: BLUE CROSS/BLUE SHIELD

## 2014-09-19 DIAGNOSIS — Z7189 Other specified counseling: Secondary | ICD-10-CM

## 2014-09-19 DIAGNOSIS — Z515 Encounter for palliative care: Secondary | ICD-10-CM | POA: Insufficient documentation

## 2014-09-19 DIAGNOSIS — F4323 Adjustment disorder with mixed anxiety and depressed mood: Secondary | ICD-10-CM

## 2014-09-19 LAB — CULTURE, BLOOD (ROUTINE X 2)
CULTURE: NO GROWTH
Culture: NO GROWTH

## 2014-09-19 LAB — BASIC METABOLIC PANEL
Anion gap: 4 — ABNORMAL LOW (ref 5–15)
BUN: 14 mg/dL (ref 6–23)
CHLORIDE: 89 mmol/L — AB (ref 96–112)
CO2: 46 mmol/L — AB (ref 19–32)
Calcium: 8.3 mg/dL — ABNORMAL LOW (ref 8.4–10.5)
Creatinine, Ser: 0.44 mg/dL — ABNORMAL LOW (ref 0.50–1.10)
GFR calc Af Amer: >90 mL/min (ref >90)
GFR calc non Af Amer: >90 mL/min (ref >90)
Glucose, Bld: 133 mg/dL — ABNORMAL HIGH (ref 70–99)
POTASSIUM: 3.3 mmol/L — AB (ref 3.5–5.1)
SODIUM: 139 mmol/L (ref 135–145)

## 2014-09-19 LAB — CARBOXYHEMOGLOBIN
Carboxyhemoglobin: 1.4 % (ref 0.5–1.5)
Methemoglobin: 1 % (ref 0.0–1.5)
O2 Saturation: 69.6 %
Total hemoglobin: 13.6 g/dL (ref 12.0–16.0)

## 2014-09-19 LAB — CBC
HCT: 31.3 % — ABNORMAL LOW (ref 36.0–46.0)
Hemoglobin: 10 g/dL — ABNORMAL LOW (ref 12.0–15.0)
MCH: 33.1 pg (ref 26.0–34.0)
MCHC: 31.9 g/dL (ref 30.0–36.0)
MCV: 103.6 fL — ABNORMAL HIGH (ref 78.0–100.0)
Platelets: 412 10*3/uL — ABNORMAL HIGH (ref 150–400)
RBC: 3.02 MIL/uL — ABNORMAL LOW (ref 3.87–5.11)
RDW: 13.5 % (ref 11.5–15.5)
WBC: 11 10*3/uL — ABNORMAL HIGH (ref 4.0–10.5)

## 2014-09-19 LAB — MAGNESIUM: MAGNESIUM: 1.8 mg/dL (ref 1.5–2.5)

## 2014-09-19 MED ORDER — ALPRAZOLAM 0.5 MG PO TABS: 0.5000 mg | ORAL_TABLET | Freq: Three times a day (TID) | ORAL | Status: DC | PRN

## 2014-09-19 MED ORDER — BUDESONIDE-FORMOTEROL FUMARATE 160-4.5 MCG/ACT IN AERO
2.0000 | INHALATION_SPRAY | Freq: Two times a day (BID) | RESPIRATORY_TRACT | Status: DC
  Administered 2014-09-19 – 2014-09-21 (×4): 2 via RESPIRATORY_TRACT
  Filled 2014-09-19: qty 6

## 2014-09-19 MED ORDER — POTASSIUM CHLORIDE CRYS ER 20 MEQ PO TBCR
40.0000 meq | EXTENDED_RELEASE_TABLET | Freq: Once | ORAL | Status: AC
  Administered 2014-09-19: 40 meq via ORAL
  Filled 2014-09-19: qty 2

## 2014-09-19 MED ORDER — MAGNESIUM SULFATE 2 GM/50ML IV SOLN
2.0000 g | Freq: Once | INTRAVENOUS | Status: AC
  Administered 2014-09-19: 2 g via INTRAVENOUS
  Filled 2014-09-19: qty 50

## 2014-09-19 MED ORDER — ALPRAZOLAM 0.5 MG PO TABS
0.5000 mg | ORAL_TABLET | Freq: Three times a day (TID) | ORAL | Status: DC
  Administered 2014-09-19 – 2014-09-20 (×3): 0.5 mg via ORAL
  Filled 2014-09-19 (×4): qty 1

## 2014-09-19 MED ORDER — ALPRAZOLAM 0.5 MG PO TABS
0.5000 mg | ORAL_TABLET | ORAL | Status: AC
  Administered 2014-09-19: 0.5 mg via ORAL

## 2014-09-19 MED ORDER — HYDROCOD POLST-CPM POLST ER 10-8 MG/5ML PO SUER: 5.0000 mL | Freq: Two times a day (BID) | ORAL | Status: DC | PRN

## 2014-09-19 MED ORDER — PREDNISONE 20 MG PO TABS
40.0000 mg | ORAL_TABLET | Freq: Every day | ORAL | Status: DC
  Administered 2014-09-20 – 2014-09-21 (×2): 40 mg via ORAL
  Filled 2014-09-19 (×3): qty 2

## 2014-09-19 NOTE — Progress Notes (Addendum)
Name: Linda Donovan MRN: [id number] DOB: 12-31-1955    ADMISSION DATE:  09/12/2014 CONSULTATION DATE:  4/13   REFERRING MD :  Bensimhon   CHIEF COMPLAINT:  Lung ca, COPD   BRIEF PATIENT DESCRIPTION:  59yo female former smoker with hx COPD on 2L home O2, recurrent stage IIIa NSCLC with previous chemo/XRT 2014, currently undergoing additional chemotherapy/XRT (only recent dose chemo on 08/20/14).  Presented 4/7 with 1 day hx worsening SOB and cough and was admitted by [facility] with PNA v radiation pneumonitis and started on abx.  Also found to have severe LV dysfunction r/t chemo v ischemic cardiomyopathy.  PCCM consulted 4/13 r/t ongoing dyspnea.   SIGNIFICANT EVENTS      STUDIES:  2D echo 4/8>>> EF 25-30%, severely reduced LV function, PA pressure 63mmHg, mild to mod TR   HISTORY OF PRESENT ILLNESS:  59yo female former smoker with hx COPD on 2L home O2, recurrent stage IIIa NSCLC with previous chemo/XRT 2014, currently undergoing additional chemotherapy/XRT (only recent dose chemo on 08/20/14).  Presented 4/7 with 1 day hx worsening SOB and cough and was admitted by [facility] with PNA v radiation pneumonitis and started on abx.  Also found to have severe LV dysfunction r/t chemo v ischemic cardiomyopathy.  PCCM consulted 4/13 r/t ongoing dyspnea.    SUBJECTIVE:  Pt feeling much better today, breathing better.    VITAL SIGNS: Temp:  [97.3 F (36.3 C)-98.1 F (36.7 C)] 98.1 F (36.7 C) (04/14 0759) Pulse Rate:  [111-145] 121 (04/14 0759) Resp:  [13-42] 26 (04/14 0759) BP: (88-136)/(53-112) 119/75 mmHg (04/14 0759) SpO2:  [93 %-100 %] 96 % (04/14 0759) Weight:  [126 lb 5.2 oz (57.3 kg)] 126 lb 5.2 oz (57.3 kg) (04/14 0500)  PHYSICAL EXAMINATION: General:  Chronically ill appearing female, in NAD, sitting in chair  HEENT:  EOMI, no JVD  Cardiovascular:  Tachycardic, NSR 120's  Lungs:  Resps even, unlabored, coarse rhonchi, no appreciable crackles Abdomen:  Soft,  nondistended Musculoskeletal:  Warm and dry Neuro:  Awake, alert, appropriate, non focal    Recent Labs Lab 09/17/14 0528 09/18/14 0219 09/19/14 0500  NA 139 139 139  K 4.2 4.3 3.3*  CL 94* 93* 89*  CO2 37* 38* 46*  BUN 19 16 14   CREATININE 0.57 0.51 0.44*  GLUCOSE 120* 116* 133*    Recent Labs Lab 09/15/14 0430 09/16/14 0520 09/19/14 0500  HGB 11.5* 12.0 10.0*  HCT 35.3* 37.2 31.3*  WBC 6.1 14.2* 11.0*  PLT 595* 651* 412*   Dg Chest Portable 1 View  09/18/2014   CLINICAL DATA:  Shortness of breath, COPD.  Radiation pneumonitis.  EXAM: PORTABLE CHEST - 1 VIEW  COMPARISON:  09/16/2014  FINDINGS: Further improvement in right mid and lower lung airspace opacities. Continued dense opacification of the right upper lobe. Scarring noted in the lingula, stable. Underlying COPD. Heart is normal size. No acute bony abnormality.  IMPRESSION: Further improvement in right mid and lower lung airspace opacities, likely improving pneumonia/pneumonitis. Stable dense right apical opacity.  COPD.   Electronically Signed   By: Rolm Baptise M.D.   On: 09/18/2014 12:19    ASSESSMENT / PLAN:  PULMONARY A: Acute hypoxic respiratory failure - multifactorial r/t severe COPD, recurrent lung ca now with RLL PNA v radiation pneumonitis (known RUL tumor) now all c/b severe LV dysfunction and marked tachycardia.   ?HCAP, CXR with improvement O2 dependent +/- AECOPD  Severe COPD  Stage IIIA adenocarcinoma of RUL with recent recurrence,  on XRT Radiation pneumonitis  P:  Supplemental O2 as needed  Cont solumedrol  Duonebs scheduled, prn xopenex for now  Cough suppression  Continue empiric abx for ?CAP  Patient and family processing disease progress at this point, palliative care consulted Ativan PRN for anxiety  CARDIOVASCULAR A: Acute on chronic sCHF - L ventricular systolic dysfunction ?r/t chemo  Sinus tachycardia  P:  Cardiology following Cont digoxin, aldactone per Cards  CODE STATUS:  After discussion with Dr, Lake Bells today, the patient has agreed to DNR/DNI.  Otho Bellows, MD Internal Medicine Resident, [facility] 09/19/2014 9:20 AM   Attending:  I have seen and examined the patient with nurse practitioner/resident and agree with the note above.   On my exam [name] has improved work of breathing, still diminished breath sounds, no wheezing, CV exam slight systolic murmur  EKG reviewed> sinus tach, no ST wave changes  Emphysema> stable, add back home symbicort, continue xopenex CHF> improved today, per cardiology Advanced, recurrent lung cancer> per oncology HCAP? > continue Abx for 5 days Radiation pneumonitis> I will wean solumedrol today, plan change to prednisone tomorrow and would treat with slow taper over 14 days  Code status:  I discussed her case with Dr. Haroldine Laws at length today.  I feel her dyspnea is primarily due to her severe lung illnesses (radiation pneumonitis, advanced lung cancer and severe emphysema), and the CHF just made things worse.  She has a very poor prognosis and advanced life support would not be medically beneficial.  Her goal is to survive until next month but I explained to her that should she decompensate between now and then and required mechanical ventilation the chances of survival would be very poor. Further I explained that this would cause unneccessary pain and suffering.  She voiced understanding and agreed to a DNR status.    > 45 minutes spend discussing case with patient, her family, cardiology and palliative medicine  Roselie Awkward, MD [place] [facility] Pager: [phone] Cell: [phone] If no response, call [phone]

## 2014-09-19 NOTE — Progress Notes (Signed)
   09/19/14 1600  Clinical Encounter Type  Visited With Patient and family together  Visit Type Initial;Spiritual support;Social support;Critical Care   Chaplain was referred to patient via spiritual care consult consult indicated that patient would like to create or update an advanced directive. Chaplain was being visited by several family members and a pastor while chaplain was present. Patient said she was feeling much better today. Patient was interested in completing advanced directive but did not have the forms. Chaplain provided patient with advanced directive forms and went through the contents briefly. Patient will consult with her family and complete advanced directive when she is ready. Call spiritual care department when patient has completed Advanced Directive.  Gar Ponto, Chaplain  4:52 PM

## 2014-09-19 NOTE — Consult Note (Signed)
Patient Linda Donovan      DOB: 08-03-1955      IHK:[id number]     Consult Note from the Palliative Medicine Team at [facility] Requested by: Dr Thereasa Solo    PCP: Pcp Not In System Reason for Consultation: Clarification of [place] and options    Phone Number:None  Assessment of patients Current state:  Continued physical and functional decline 2/2 to multiple co-morbidities; CHF, NSCLC, cardiomyopathy, anxiety.  Patient and family are faced with treatment option decisions, advanced directive and anticipatory care needs.  Consult is for review of medical treatment options, clarification of goals of care and , disposition and options, and symptom recommendations as indicated.  This NP Wadie Lessen reviewed medical records, received report from team, assessed the patient and then meet at the patient's bedside along with her husband and mother  to discuss diagnosis, prognosis, GOC, EOL wishes disposition and options.  A detailed discussion was had today regarding advanced directives.  Concepts specific to code status, artifical feeding and hydration, continued IV antibiotics and rehospitalization was had.  The difference between a aggressive medical intervention path  and a palliative comfort care path for this patient at this time was had.  Values and goals of care important to patient and family were attempted to be elicited.  Concept of Hospice and Palliative Care were discussed  Natural trajectory and expectations at EOL were discussed.  Questions and concerns addressed.  Hard Choices booklet left for review. Family encouraged to call with questions or concerns.  PMT will continue to support holistically.   Goals of Care: 1.  Code Status:  Full code-encouraged to consider DNR/DNI status knowing its poor outcomes in similar patients.    2. Scope of Treatment:  1. Patient's goal is to "be alive and able " to attend her daughter's graduation from [employer] in May.  She is open to all offered and available medical interventions to prolong life to achieve this goal. She verbalizes understanding that further chemo is unlikley   3. Disposition: Pending outcomes, hopefull home with home health vs hospice if open to services    4. Symptom Management:   1. Anxiety/Agitation: Xanax 0.5 mg po TID             Discussed use of prayer, meditation and deep breathing to aide in relaxation.  Patient is open to working with this NP on relaxation techniques in the morning  2. Pain:  Percocet 5-325 mg PO one every 4 hrs prn    5. Psychosocial:  Emotional support offered to patient and family at bedsdie  6. Spiritual:  Chaplain consulted   Brief HPI:  59 yo female with PMH lung cancer, prior pericardial window for effusion for evaluation of dyspnea and elevated troponin. Patient is undergoing radiation therapy and chemotherapy for recurrent lung cancer. Following radiation yesterday she developed worsening dyspnea. There was no chest pain, fevers, chills but there was a nonproductive cough. Chest CT shows a mass in the right upper lobe with patchy airspace opacity in the right upper lobe as well. She is felt to have pneumonia. 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 Now complicated by severe LV dysfunction and marked tachycardia, poor long term prognosis.  ROS:  Weakness, anxiety, dyspnea on exertion   PMH:  Past Medical History  Diagnosis Date  . COPD (chronic obstructive pulmonary disease)   . ADD (attention deficit disorder)   . Hx of radiation therapy 09/28/12-  11/17/12    RU lung mass, mediastinum chest, 63 gray 35 fx  . Pneumonia   . Arthritis   . Non-small cell lung cancer 09/18/2012    RUL  . Asthma      PSH: Past Surgical History  Procedure Laterality Date  . Total abdominal hysterectomy    . Shoulder arthroscopy    . Video bronchoscopy Bilateral 07/25/2012    Procedure: VIDEO BRONCHOSCOPY WITH  FLUORO;  Surgeon: Tanda Rockers, MD;  Location: [facility];  Service: Endoscopy;  Laterality: Bilateral;  . Lump removed from breasr right  2003  . Endobronchial ultrasound Bilateral 09/04/2012    Procedure: ENDOBRONCHIAL ULTRASOUND;  Surgeon: Collene Gobble, MD;  Location: [facility];  Service: Cardiopulmonary;  Laterality: Bilateral;  . Breast surgery    . Subxyphoid pericardial window N/A 01/04/2014    Procedure: SUBXYPHOID PERICARDIAL WINDOW;  Surgeon: [name], MD;  Location: [facility];  Service: Thoracic;  Laterality: N/A;  . Intraoperative transesophageal echocardiogram N/A 01/04/2014    Procedure: INTRAOPERATIVE TRANSESOPHAGEAL ECHOCARDIOGRAM;  Surgeon: [name], MD;  Location: [facility];  Service: Open Heart Surgery;  Laterality: N/A;   I have reviewed the [place] and SH and  If appropriate update it with new information. Allergies  Allergen Reactions  . Clinoril [Sulindac] Hives  . Morphine Itching   Scheduled Meds: . antiseptic oral rinse  7 mL Mouth Rinse BID  . aspirin  81 mg Oral Daily  . ceFEPime (MAXIPIME) IV  1 g Intravenous 3 times per day  . digoxin  0.125 mg Oral Daily  . docusate sodium  100 mg Oral BID  . enoxaparin (LOVENOX) injection  40 mg Subcutaneous Q24H  . folic acid  1 mg Oral Daily  . ipratropium  0.5 mg Nebulization QID  . levalbuterol  0.63 mg Nebulization QID  . magnesium oxide  400 mg Oral BID  . methylPREDNISolone (SOLU-MEDROL) injection  60 mg Intravenous Q12H  . sodium chloride  10-40 mL Intracatheter Q12H  . spironolactone  12.5 mg Oral Daily  . sucralfate  1 g Oral TID WC & HS  . traZODone  50 mg Oral QHS  . vancomycin  750 mg Intravenous Q12H   Continuous Infusions:  PRN Meds:.ALPRAZolam, chlorpheniramine-HYDROcodone, levalbuterol, oxyCODONE-acetaminophen, polyethylene glycol, sodium chloride    BP 119/75 mmHg  Pulse 121  Temp(Src) 98.1 F (36.7 C) (Oral)  Resp 26  Ht 5\' 3"  (1.6 m)  Wt 57.3 kg (126 lb 5.2 oz)  BMI  22.38 kg/m2  SpO2 96%   PPS:40 % at best   Intake/Output Summary (Last 24 hours) at 09/19/14 0945 Last data filed at 09/19/14 0505  Gross per 24 hour  Intake    870 ml  Output   3550 ml  Net  -2680 ml     Physical Exam:  General: chronically ill appearing, OOB in chair HEENT:  Moist buccal membranes  Chest:  Decreased in bases CVS: tachycardic Abdomen: soft NT +BS   Labs: CBC    Component Value Date/Time   WBC 11.0* 09/19/2014 0500   WBC 4.4 09/12/2014 1444   RBC 3.02* 09/19/2014 0500   RBC 3.70 09/12/2014 1444   HGB 10.0* 09/19/2014 0500   HGB 12.2 09/12/2014 1444   HCT 31.3* 09/19/2014 0500   HCT 36.6 09/12/2014 1444   PLT 412* 09/19/2014 0500   PLT 586* 09/12/2014 1444   MCV 103.6* 09/19/2014 0500   MCV 98.9 09/12/2014 1444   MCH 33.1 09/19/2014 0500   MCH  32.9 09/12/2014 1444   MCHC 31.9 09/19/2014 0500   MCHC 33.2 09/12/2014 1444   RDW 13.5 09/19/2014 0500   RDW 12.9 09/12/2014 1444   LYMPHSABS 0.2* 09/12/2014 1815   LYMPHSABS 0.1* 09/12/2014 1444   MONOABS 0.1 09/12/2014 1815   MONOABS 0.1 09/12/2014 1444   EOSABS 0.0 09/12/2014 1815   EOSABS 0.0 09/12/2014 1444   BASOSABS 0.0 09/12/2014 1815   BASOSABS 0.0 09/12/2014 1444    BMET    Component Value Date/Time   NA 139 09/19/2014 0500   NA 138 09/12/2014 1444   K 3.3* 09/19/2014 0500   K 4.0 09/12/2014 1444   CL 89* 09/19/2014 0500   CL 104 11/13/2012 1122   CO2 46* 09/19/2014 0500   CO2 26 09/12/2014 1444   GLUCOSE 133* 09/19/2014 0500   GLUCOSE 188* 09/12/2014 1444   GLUCOSE 121* 11/13/2012 1122   BUN 14 09/19/2014 0500   BUN 6.6* 09/12/2014 1444   CREATININE 0.44* 09/19/2014 0500   CREATININE 0.6 09/12/2014 1444   CALCIUM 8.3* 09/19/2014 0500   CALCIUM 9.3 09/12/2014 1444   GFRNONAA >90 09/19/2014 0500   GFRAA >90 09/19/2014 0500    CMP     Component Value Date/Time   NA 139 09/19/2014 0500   NA 138 09/12/2014 1444   K 3.3* 09/19/2014 0500   K 4.0 09/12/2014 1444   CL 89*  09/19/2014 0500   CL 104 11/13/2012 1122   CO2 46* 09/19/2014 0500   CO2 26 09/12/2014 1444   GLUCOSE 133* 09/19/2014 0500   GLUCOSE 188* 09/12/2014 1444   GLUCOSE 121* 11/13/2012 1122   BUN 14 09/19/2014 0500   BUN 6.6* 09/12/2014 1444   CREATININE 0.44* 09/19/2014 0500   CREATININE 0.6 09/12/2014 1444   CALCIUM 8.3* 09/19/2014 0500   CALCIUM 9.3 09/12/2014 1444   PROT 7.2 09/12/2014 1444   PROT 6.3 01/06/2014 0311   ALBUMIN 3.0* 09/12/2014 1444   ALBUMIN 2.7* 01/06/2014 0311   AST 23 09/12/2014 1444   AST 20 01/06/2014 0311   ALT 21 09/12/2014 1444   ALT 15 01/06/2014 0311   ALKPHOS 110 09/12/2014 1444   ALKPHOS 83 01/06/2014 0311   BILITOT 0.29 09/12/2014 1444   BILITOT 0.3 01/06/2014 0311   GFRNONAA >90 09/19/2014 0500   GFRAA >90 09/19/2014 0500    Time In Time Out Total Time Spent with Patient Total Overall Time  0915 1030 70 min 75 min    Greater than 50%  of this time was spent counseling and coordinating care related to the above assessment and plan.   Wadie Lessen NP  Palliative Medicine Team Team Phone # [phone] Pager [phone]  Discussed with Dr Lake Bells and nursing staff

## 2014-09-19 NOTE — Progress Notes (Signed)
SUBJECTIVE:   Linda Donovan 59 yo female with PMH lung cancer, severe COPD, prior pericardial window for effusion for evaluation of dyspnea and elevated troponin. Patient is undergoing radiation therapy and chemotherapy for recurrent lung cancer. Following radiation yesterday she developed worsening dyspnea. CXR suggestive of RLL pneumonia/pneumonitis and RUL with invasive carcinoma.   Left ventricular systolic dysfunction, new since August 2015 possibly secondary to chemotherapy.  EF by MRI was 26% and RVF EF 36%. Mild to moderate TR with mild Pulmonary HTN also noted. 09/13/2014  ECHO EF 25-30% Mild RV HK. RVSP 60  Yesterday she had PICC placed with adequate CO-OX--> 69%. CVP 5-7. Seen by critical care who expanded abx and also adjusted COPD regimen.   SOB at rest. Denies CP.  Remains in sinus tach 120s   OBJECTIVE:   Vitals:   Filed Vitals:   09/19/14 0759 09/19/14 0800 09/19/14 0900 09/19/14 0949  BP: 119/75 107/58 97/50   Pulse: 121 122 121 128  Temp: 98.1 F (36.7 C)     TempSrc: Oral     Resp: 26 22    Height:      Weight:      SpO2: 96% 97% 97%    I&O's:    Intake/Output Summary (Last 24 hours) at 09/19/14 1029 Last data filed at 09/19/14 0953  Gross per 24 hour  Intake   1180 ml  Output   3550 ml  Net  -2370 ml   TELEMETRY: 130-140s.    PHYSICAL EXAM General: Ill appearing. Sitting up in bed to breathe. Tachypneic  Head: Eyes PERRLA, No xanthomas.   Normal cephalic and atramatic  Lungs:  Poor air movement. Rhonchi on RUL. Upper airway gurgling Heart:   Distant. Tachy regular. No obvious s3  Abdomen: Bowel sounds are positive, abdomen soft and non-tender without masses  Extremities:   No clubbing, cyanosis or edema.  DP +1 Neuro: Alert and oriented X 3. Psych:  Good affect, responds appropriately   LABS: Basic Metabolic Panel:  Recent Labs  09/18/14 0219 09/19/14 0500  NA 139 139  K 4.3 3.3*  CL 93* 89*  CO2 38* 46*  GLUCOSE 116* 133*  BUN 16 14    CREATININE 0.51 0.44*  CALCIUM 8.8 8.3*  MG  --  1.8   Liver Function Tests: No results for input(s): AST, ALT, ALKPHOS, BILITOT, PROT, ALBUMIN in the last 72 hours. No results for input(s): LIPASE, AMYLASE in the last 72 hours. CBC:  Recent Labs  09/19/14 0500  WBC 11.0*  HGB 10.0*  HCT 31.3*  MCV 103.6*  PLT 412*   Cardiac Enzymes: No results for input(s): CKTOTAL, CKMB, CKMBINDEX, TROPONINI in the last 72 hours. BNP: Invalid input(s): POCBNP D-Dimer: No results for input(s): DDIMER in the last 72 hours. Hemoglobin A1C: No results for input(s): HGBA1C in the last 72 hours. Fasting Lipid Panel: No results for input(s): CHOL, HDL, LDLCALC, TRIG, CHOLHDL, LDLDIRECT in the last 72 hours. Thyroid Function Tests: No results for input(s): TSH, T4TOTAL, T3FREE, THYROIDAB in the last 72 hours.  Invalid input(s): FREET3 Anemia Panel: No results for input(s): VITAMINB12, FOLATE, FERRITIN, TIBC, IRON, RETICCTPCT in the last 72 hours. Coag Panel:   Lab Results  Component Value Date   INR 1.01 01/03/2014   INR 0.89 09/04/2012    RADIOLOGY: Ct Angio Chest Pe W/cm &/or Wo Cm  09/12/2014   CLINICAL DATA:  History of recurrent lung cancer with increasing shortness of breath. Patient is currently being treated for potential pneumonia.  EXAM: CT ANGIOGRAPHY CHEST WITH CONTRAST  TECHNIQUE: Multidetector CT imaging of the chest was performed using the standard protocol during bolus administration of intravenous contrast. Multiplanar CT image reconstructions and MIPs were obtained to evaluate the vascular anatomy.  CONTRAST:  90mL OMNIPAQUE IOHEXOL 350 MG/ML SOLN  COMPARISON:  July 17, 2014  FINDINGS: There is no pulmonary embolus. There is atherosclerosis of the aorta without aortic dissection or aneurysmal dilatation. The heart size is normal. There is no pericardial effusion. Mediastinal and subcarinal lymph nodes are unchanged.  Ill-defined masslike opacity is seen in the posterior  right upper lung field with involvement of the right hilum unchanged compared to prior exam. There is interval developed patchy airspace opacity involving the right upper lobe, differential diagnosis includes interval developed pneumonia or radiation pneumonitis if there has been interval radiation since February 2016. There is interval increase of small to moderate right pleural effusion. The left lung is unchanged with emphysematous changes.  The visualized upper abdominal structures are unremarkable. There is a small hiatal hernia. No focal lytic or blastic lesion is identified within the visualized bones.  Review of the MIP images confirms the above findings.  IMPRESSION: Ill-defined masslike opacity identified in the right upper lung field with involvement of the right hilum consistent with patient's known recurrent lung cancer without changed compared to prior CT.  Interval developed patchy airspace opacity involving the right upper lobe, differential diagnosis includes interval foot bowel pneumonia or radiation pneumonitis if there has been interval radiation since February 2016.  Interval increase small to moderate right pleural effusion.   Electronically Signed   By: Abelardo Diesel M.D.   On: 09/12/2014 20:16   Dg Chest Portable 1 View  09/18/2014   CLINICAL DATA:  Shortness of breath, COPD.  Radiation pneumonitis.  EXAM: PORTABLE CHEST - 1 VIEW  COMPARISON:  09/16/2014  FINDINGS: Further improvement in right mid and lower lung airspace opacities. Continued dense opacification of the right upper lobe. Scarring noted in the lingula, stable. Underlying COPD. Heart is normal size. No acute bony abnormality.  IMPRESSION: Further improvement in right mid and lower lung airspace opacities, likely improving pneumonia/pneumonitis. Stable dense right apical opacity.  COPD.   Electronically Signed   By: Rolm Baptise M.D.   On: 09/18/2014 12:19   Dg Chest Port 1 View  09/16/2014   CLINICAL DATA:  59 year old  female with a history of pneumonia and shortness of breath. Known lung carcinoma.  EXAM: PORTABLE CHEST - 1 VIEW  COMPARISON:  CT chest 09/12/2014, chest x-ray 09/12/2014, PET-CT 08/12/2014, chest x-ray 08/01/2014  FINDINGS: Cardiac size unchanged.  Contour of the mediastinum is similar, with obscuration of the right mediastinum secondary to right upper lobe/hilar consolidation/mass, characterized previously on the CT and PET-CT.  Elevation of the minor fissure.  Improved appearance of airspace opacities and interlobular septal thickening of the right lower lobe compared to the prior chest x-ray.  Right pleural parenchymal opacity at the apex persists as well as airspace disease of the right upper lobe.  No left-sided consolidative airspace disease. Similar appearance of the interstitial opacities at the base.  Persistent blunting of the right costophrenic angle. No large with left-sided pleural effusion.  No displaced fracture.  IMPRESSION: Improved airspace and interstitial opacities of the right lower lung, suggesting improved pneumonia/ pneumonitis.  Persistent right hilar and right upper lobe consolidative changes compatible with the patient's known invasive carcinoma. These changes were better characterized on recent CT chest.  Trace right-sided pleural effusion.  Signed,  Dulcy Fanny. Earleen Newport, DO  [facility]  [facility]   Electronically Signed   By: Corrie Mckusick D.O.   On: 09/16/2014 14:05   Dg Chest Port 1 View  09/12/2014   CLINICAL DATA:  Short of breath for 2 days. Chest congestion and cough for 1 month. Lung cancer with recurrence. COPD and asthma.  EXAM: PORTABLE CHEST - 1 VIEW  COMPARISON:  08/01/2014 and PET of 08/12/2014.  FINDINGS: Numerous leads and wires project over the chest. Tracheal deviation to the right with mediastinal shift due to volume loss within the right hemithorax. Mild cardiomegaly. Trace right pleural fluid or thickening, blunting the  right costophrenic angle. No pneumothorax. Right apical pleural thickening. Right upper lobe masslike opacity is similar to slightly decreased. There is new right mid and lower lung interstitial and airspace disease, especially laterally. Clear left lung with interstitial thickening, lower lobe predominant.  IMPRESSION: Slight improvement in right upper lobe/ apical aeration. This was consistent with recurrent tumor on the 08/12/2014 PET. Development of interstitial and airspace disease in the right mid and lower lung. Considerations include radiation pneumonitis (if there is been interval radiation since 08/01/2014) or infection. Lymphangitic tumor spread is felt less likely, given time course.   Electronically Signed   By: Abigail Miyamoto M.D.   On: 09/12/2014 19:00   Mr Card Morphology W/o Cm  09/16/2014   CLINICAL DATA:  59 year old female  EXAM: CARDIAC MRI  TECHNIQUE: The patient was scanned on a 1.5 Tesla GE magnet. A dedicated cardiac coil was used. Functional imaging was done using Fiesta sequences. 2,3, and 4 chamber views were done to assess for RWMA's. Modified Simpson's rule using a short axis stack was used to calculate an ejection fraction on a dedicated work Conservation officer, nature. The patient received no contrast. After 10 minutes inversion recovery sequences were used to assess for infiltration and scar tissue.  CONTRAST:  None.  FINDINGS: This study is of limited quality secondary to tachycardia, tachypnea and patient's inability to hold breath.  1. Normal left ventricular size, thickness and severely decreased systolic function (LVEF = 26%). Systolic function might be underestimated secondary to significant tachycardia during acquisition.  There is diffuse hypokinesis.  LVEDV:  152 ml  LVESV:  112 ml  SV:  40 ml  CO:  3.6 L/minute  2. Normal ventricular size, thickness and mildly to moderately reduced systolic function (RVEF = 34%).  3.  Normal biatrial size.  4.  Mild mitral and mild to  moderate tricuspid regurgitation.  5.  Normal size of the aortic root and ascending aorta.  6. Moderately dilated pulmonary artery (38 mm) suggestive of significant pulmonary hypertension.  IMPRESSION: 1. Normal left ventricular size, thickness and severely decreased systolic function (LVEF = 26%). Systolic function might be underestimated secondary to significant tachycardia during acquisition.  There is diffuse hypokinesis.  2. Normal ventricular size, thickness and mildly to moderately reduced systolic function (RVEF = 34%).  3.  Normal biatrial size.  4.  Mild mitral and mild to moderate tricuspid regurgitation.  5.  Normal size of the aortic root and ascending aorta.  6. Moderately dilated pulmonary artery (38 mm) suggestive of significant pulmonary hypertension.  Ena Dawley   Electronically Signed   By: Ena Dawley   On: 09/16/2014 15:37   ASSESSMENT AND PLAN 1. A/C Systolic HF-  Left ventricular systolic dysfunction, new since August 2015 . possibly secondary to chemotherapy>  EF by MRI  was 26% and RVF EF 36%.   Mild to moderate TR with mild Pulmonary HTN also noted.  Remains SOB at rest but not related to volume overload.  2. Acute on chronic respiratory failure 3. Advanced COPD  4. Recurrent non-small cell lung cancer 5. Radiation pnuemonitis 6. Mildly elevated troponin . most likely secondary to COPD exacerbation with acute CHF and tachycardia.  CLEGG,AMY, NP  09/19/2014  10:29 AM  Patient seen and examined with Darrick Grinder, NP. We discussed all aspects of the encounter. I agree with the assessment and plan as stated above.   Very difficult case. 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 Now complicated by severe LV dysfunction and marked tachycardia.  Co-ox suggests adequate cardiac output and thus sinus tachycardia likely not due to low output more reactive to underlying illness. Given severe COPD and respiratory distress, I am  reluctant to b-block at this time.Volume status looks ok.   Will await response to steroids and abx but suspect Palliative Care will remain only viable option.   Randel Hargens,MD 11:25 AM

## 2014-09-19 NOTE — Progress Notes (Signed)
  [place] TEAM 1 - Stepdown/ICU TEAM  The patient is visiting with multiple family members in her room.  I have nothing to add beyond the exceptional care of the CHF team and the pulmonary team, both of which have seen the patient today.  I have chosen not to visit the patient today/to interrupt her time with her family.  I will follow-up tomorrow.   Cherene Altes, MD [facility] For Consults/Admissions - Flow Manager [phone] Office  [phone]  Contact MD directly via text page:      [email or web address]      password [facility]

## 2014-09-20 ENCOUNTER — Telehealth: Payer: Self-pay | Admitting: *Deleted

## 2014-09-20 ENCOUNTER — Other Ambulatory Visit: Payer: BLUE CROSS/BLUE SHIELD

## 2014-09-20 ENCOUNTER — Ambulatory Visit: Payer: BLUE CROSS/BLUE SHIELD

## 2014-09-20 DIAGNOSIS — F4323 Adjustment disorder with mixed anxiety and depressed mood: Secondary | ICD-10-CM

## 2014-09-20 MED ORDER — LOSARTAN POTASSIUM 25 MG PO TABS
12.5000 mg | ORAL_TABLET | Freq: Every day | ORAL | Status: DC
  Administered 2014-09-20 – 2014-09-21 (×2): 12.5 mg via ORAL
  Filled 2014-09-20 (×2): qty 0.5

## 2014-09-20 MED ORDER — ALPRAZOLAM 0.5 MG PO TABS
1.0000 mg | ORAL_TABLET | Freq: Three times a day (TID) | ORAL | Status: DC
  Administered 2014-09-20 – 2014-09-21 (×4): 1 mg via ORAL
  Filled 2014-09-20 (×4): qty 2

## 2014-09-20 NOTE — Progress Notes (Signed)
Daily Progress Note   Patient Name: Linda Donovan  DOB: Oct 15, 1955          Age: 59 y.o.             Attending Physician: Cherene Altes, MD MRN#: [id number]                                            Primary Care Physician: Pcp Not In System Admit Date: 09/12/2014  Date: 09/20/2014  Reason for Consultation/Follow-up: Establishing goals of care, Non pain symptom management and Psychosocial/spiritual support  Subjective: -Patient "feels better today", her anxiety is better with added medications and she feels more "at ease" with previous [place]  conversation  Interval Events:  -pateint is establishing and preparing for discharge home, open to hospice services, will write for choice  8 days  Current Medications: Scheduled Meds:  . ALPRAZolam  1 mg Oral TID  . antiseptic oral rinse  7 mL Mouth Rinse BID  . aspirin  81 mg Oral Daily  . budesonide-formoterol  2 puff Inhalation BID  . ceFEPime (MAXIPIME) IV  1 g Intravenous 3 times per day  . digoxin  0.125 mg Oral Daily  . docusate sodium  100 mg Oral BID  . enoxaparin (LOVENOX) injection  40 mg Subcutaneous Q24H  . folic acid  1 mg Oral Daily  . ipratropium  0.5 mg Nebulization QID  . levalbuterol  0.63 mg Nebulization QID  . magnesium oxide  400 mg Oral BID  . predniSONE  40 mg Oral Q breakfast  . sodium chloride  10-40 mL Intracatheter Q12H  . spironolactone  12.5 mg Oral Daily  . sucralfate  1 g Oral TID WC & HS  . traZODone  50 mg Oral QHS  . vancomycin  750 mg Intravenous Q12H    Continuous Infusions:    PRN Meds: chlorpheniramine-HYDROcodone, levalbuterol, oxyCODONE-acetaminophen, polyethylene glycol, sodium chloride    Palliative Performance Scale: 40 % at best   Vital Signs: BP 124/77 mmHg  Pulse 120  Temp(Src) 98.2 F (36.8 C) (Oral)  Resp 30  Ht 5\' 3"  (1.6 m)  Wt 58.559 kg (129 lb 1.6 oz)  BMI 22.87 kg/m2  SpO2 98% SpO2: SpO2: 98 % O2 Device: O2 Device: Nasal Cannula O2 Flow Rate: O2 Flow Rate  (L/min): 4 L/min  Intake/output summary:  Intake/Output Summary (Last 24 hours) at 09/20/14 1047 Last data filed at 09/20/14 0900  Gross per 24 hour  Intake    720 ml  Output   2500 ml  Net  -1780 ml    LBM:    Baseline Weight: Weight: 61 kg (134 lb 7.7 oz) Most recent weight: Weight: 58.559 kg (129 lb 1.6 oz)  Physical Exam:             General Appearance:alert, cooperative, fatigued and mild distress             Heart:tachycardic             Respiratory: diminished breath sounds bilaterally             Abdomen: soft NT +BS             Extremities:negative             Neurologic: 5/5 strength bilateral upper and lower extremities  Additional Data Reviewed: Recent Labs     09/18/14  0219  09/19/14  0500  WBC   --   11.0*  HGB   --   10.0*  PLT   --   412*  NA  139  139  BUN  16  14  CREATININE  0.51  0.44*     Problem List:  Patient Active Problem List   Diagnosis Date Noted  . DNR (do not resuscitate) discussion 09/19/2014  . Adjustment disorder with mixed anxiety and depressed mood 09/19/2014  . Palliative care encounter   . DCM (dilated cardiomyopathy) 09/17/2014  . Acute systolic CHF (congestive heart failure) 09/17/2014  . Left ventricular dysfunction with reduced left ventricular function   . Dyspnea 09/13/2014  . Elevated troponin 09/13/2014  . Right upper lobe consolidation 09/12/2014  . Acute on chronic respiratory failure with hypoxia 09/12/2014  . Pericardial effusion without cardiac tamponade 01/04/2014  . Pericardial effusion with cardiac tamponade 01/03/2014  . SOB (shortness of breath) 01/03/2014  . Sinus tachycardia 01/03/2014  . On antineoplastic chemotherapy 03/08/2013  . Non-small cell lung cancer 09/18/2012  . Rhinitis 08/25/2012  . COPD  GOLD III 07/19/2012     Palliative Care Assessment & Plan    1. Code Status: DNR  2. Goals of Care:  Medical management of treatable illness, hopeful for prolonged quality of life  to enable her to get to her daughter's graduation in May  Desire for further Chaplaincy support:no  3. Symptom Management:  Anxiety: Xanax as written  Discussed medication, deep breathing and prayer   5. Prognosis: < 6 months  5. Disposition Recommendations: Home with [facility] Death: no  Impression and plan was discussed with Patient and her husband and her mother   Thank you for allowing the Palliative Medicine Team to assist in the care of this patient.   Time In: 1100 Time Out: 1125 Total Time 25 min Prolonged Time Billed  no     Greater than 50%  of this time was spent counseling and coordinating care related to the above assessment and plan.   Knox Royalty, NP  09/20/2014, 10:47 AM  Please contact Palliative Medicine Team phone at [phone] for questions and concerns.    Discussed with Dr Thereasa Solo

## 2014-09-20 NOTE — Telephone Encounter (Signed)
I Spoke to [place] and told her Julien Nordmann will be attending  And ok for Hospice MDs to do symptom management

## 2014-09-20 NOTE — Telephone Encounter (Signed)
TC from Stacy @ [facility] [phone]). Pt. Is discharging from hospital today with hospice. Does Dr. Julien Nordmann agree to be the attending MD and ok for hospice MD's to do symptom management. Please advise.

## 2014-09-20 NOTE — Progress Notes (Signed)
[facility] PCCM  S: rested comfortably overnight, feeling better  O:  Filed Vitals:   09/20/14 0100 09/20/14 0200 09/20/14 0400 09/20/14 0500  BP: 93/35 67/49 99/71    Pulse: 114 113 125   Temp:   98.4 F (36.9 C)   TempSrc:   Oral   Resp: 23 21 23    Height:      Weight:    58.559 kg (129 lb 1.6 oz)  SpO2: 97% 98% 98%    4L [place]  Gen: resting comfortably HENT: NCAT, OP clear Eyes: EOMi, sclera anicteric CV: Tachy, regular, no JVD PULM: Crackles R base, otherwise clear GI: BS+, soft Neuro: Awake and alert  Labs reviewed, no major changes in kidney function or anemia, notes from consultants reviewed including palliative medicine and CHF service  Impression/Plan: 1) Severe COPD> would use Symbicort and Spiriva at discharge 2) Radiation pneumonitis, start prednisone 40mg  daily today, titrate off over 2 weeks 3) Acute on chronic respiratory failure with hypoxemia> ambulate today, would be OK with d/c home when she is able to ambulate with 6L O2 or less 4) Lung cancer> would arrange home hospice 5) CHF > per cardiology service.  PCCM to sign off, call if questions  Roselie Awkward, MD [facility] Pager: [phone] Cell: [phone] If no response, call [phone]

## 2014-09-20 NOTE — Progress Notes (Signed)
[place] TEAM 1 - Stepdown/ICU TEAM Progress Note  LORITA FORINASH SAY:[id number] DOB: December 27, 1955 DOA: 09/12/2014 PCP: Pcp Not In System  Admit HPI / Brief Narrative: 59 y.o. female with h/o NSCLC, and COPD on 2L home O2 at baseline who presented to the ED 4/7 c/o 1 day of SOB. The prior week she began radiation therapy for her NSCLC (RUL).  In the ED the patient was found to have right upper lobe consolidation with acute on chronic hypoxic respiratory failure, and findings c/w radiation pneumonitis. Patient was placed on IV antibiotics and Solu-Medrol.  Events Since Admission: Troponins were positive at the time of admission, and trended up to 0.72.  Cardiology was consulted. TTE showed EF of 25-30% with severe pulmonary hypertension, PA pressure 60 mmHg. 2-D echo in 8/15 had shown EF of 55-60%. Cardiac MRI showed severely decreased left ventricular systolic function (EF 23%), diffuse hypokinesis, moderately reduced right-sided systolic function, RVEF 55% with mild-to-moderate tricuspid regurgitation, and significant pulmonary hypertension.  The patient was persistnetly tachypneic, and borderline hypotensive with tachycardia.  On 4/12 the pt was transferred to [facility] for consult w the Advanced Heart Failure Team.  HPI/Subjective: The pt is alert and conversant stating she feels much better.  She denies current cp, n/v, abdom pain, or sob.    Assessment/Plan:  Acute on chronic hypoxic respiratory failure - dyspnea  Multifactorial w/ primary contributor seemingly her lung disease (severe emphysema + advanced lung CA + radiaton pneumonitis) w/ secondary contribution from new CHF - cont current therapies  RLL HCAP v/s radiation pneumonitis  As per Pulmonary "wean solumedrol today, plan change to prednisone tomorrow and would treat with slow taper over 14 days" - plan to complete 5 day course of empiric abx   Severe COPD exacerbation, acute on chronic hypoxic respiratory  failure No evidence of active wheeze on exam today - as per Pulmonary "add back home symbicort, continue xopenex"  Advanced RUL Non-small cell lung cancer On XRT prior to admit - followed by Dr. Julien Nordmann, who had reportedly previously informed pt her 5 year survival rate was <10% - pt no longer wishes to pursue XRT or chemotherarpy - Palliative Care following and consulting Hospice - prior chemo and XRT 2014   Acute on chronic severe systolic CHF ?due to chemo tx v/s ischemic - ongoing care per CHF Team  Sinus Tachycardia Due to above as well as anxiety - dose with anxiolytic when necessary  Code Status: FULL Family Communication: Spoke with mother and patient at bedside  Disposition Plan: transfer to medical bed for conservative medical care w/ discussion for home hospice   Consultants: Heart Failure Team Oncology PCCM Palliative Care / Hospice   Antibiotics: Rocephin 4/7 > 4/12 Cefepime 4/13 > Vancomycin 4/13 >  DVT prophylaxis: lovenox  Objective: Blood pressure 116/64, pulse 121, temperature 98 F (36.7 C), temperature source Oral, resp. rate 23, height 5\' 3"  (1.6 m), weight 58.559 kg (129 lb 1.6 oz), SpO2 97 %.  Intake/Output Summary (Last 24 hours) at 09/20/14 1343 Last data filed at 09/20/14 1200  Gross per 24 hour  Intake    490 ml  Output   2250 ml  Net  -1760 ml   Exam: General: No acute respiratory distress sitting up in bed  Lungs: respirations are comfortable and calm Ext:  No appreciable edema   Data Reviewed: Basic Metabolic Panel:  Recent Labs Lab 09/14/14 0540  09/15/14 0430 09/16/14 0520 09/17/14 0528 09/18/14 0219 09/19/14 0500  NA  --   < >  139 139 139 139 139  K  --   < > 4.2 4.1 4.2 4.3 3.3*  CL  --   < > 98 96 94* 93* 89*  CO2  --   < > 33* 36* 37* 38* 46*  GLUCOSE  --   < > 128* 139* 120* 116* 133*  BUN  --   < > 6 13 19 16 14   CREATININE  --   < > 0.44* 0.56 0.57 0.51 0.44*  CALCIUM  --   < > 8.9 8.9 8.9 8.8 8.3*  MG 1.5  --    --   --   --   --  1.8  < > = values in this interval not displayed.  CBC:  Recent Labs Lab 09/14/14 0607 09/15/14 0430 09/16/14 0520 09/19/14 0500  WBC 6.7 6.1 14.2* 11.0*  HGB 10.6* 11.5* 12.0 10.0*  HCT 32.0* 35.3* 37.2 31.3*  MCV 101.6* 101.1* 101.9* 103.6*  PLT 560* 595* 651* 412*    Recent Results (from the past 240 hour(s))  Blood culture (routine x 2)     Status: None   Collection Time: 09/12/14  9:08 PM  Result Value Ref Range Status   Specimen Description BLOOD R FOREARM  Final   Special Requests BOTTLES DRAWN AEROBIC AND ANAEROBIC 5CC  Final   Culture   Final    NO GROWTH 5 DAYS Performed at [facility]    Report Status 09/19/2014 FINAL  Final  Blood culture (routine x 2)     Status: None   Collection Time: 09/12/14  9:15 PM  Result Value Ref Range Status   Specimen Description BLOOD RIGHT ARM  Final   Special Requests BOTTLES DRAWN AEROBIC AND ANAEROBIC 4CC  Final   Culture   Final    NO GROWTH 5 DAYS Performed at [facility]    Report Status 09/19/2014 FINAL  Final  Culture, sputum-assessment     Status: None   Collection Time: 09/13/14  7:27 AM  Result Value Ref Range Status   Specimen Description SPUTUM  Final   Special Requests NONE  Final   Sputum evaluation   Final    THIS SPECIMEN IS ACCEPTABLE. RESPIRATORY CULTURE REPORT TO FOLLOW.   Report Status 09/13/2014 FINAL  Final  Culture, respiratory (NON-Expectorated)     Status: None   Collection Time: 09/13/14  7:27 AM  Result Value Ref Range Status   Specimen Description SPUTUM  Final   Special Requests NONE  Final   Gram Stain   Final    RARE WBC PRESENT, PREDOMINANTLY PMN RARE SQUAMOUS EPITHELIAL CELLS PRESENT FEW GRAM POSITIVE COCCI IN PAIRS IN CHAINS IN CLUSTERS RARE GRAM POSITIVE RODS RARE GRAM NEGATIVE RODS    Culture   Final    NORMAL OROPHARYNGEAL FLORA Performed at [facility]    Report Status 09/15/2014 FINAL  Final  MRSA PCR Screening     Status:  None   Collection Time: 09/17/14  1:06 PM  Result Value Ref Range Status   MRSA by PCR NEGATIVE NEGATIVE Final    Comment:        The GeneXpert MRSA Assay (FDA approved for NASAL specimens only), is one component of a comprehensive MRSA colonization surveillance program. It is not intended to diagnose MRSA infection nor to guide or monitor treatment for MRSA infections.     Studies:   Recent x-ray studies have been reviewed in detail by the Attending Physician  Scheduled Meds:  Scheduled  Meds: . ALPRAZolam  1 mg Oral TID  . antiseptic oral rinse  7 mL Mouth Rinse BID  . aspirin  81 mg Oral Daily  . budesonide-formoterol  2 puff Inhalation BID  . ceFEPime (MAXIPIME) IV  1 g Intravenous 3 times per day  . digoxin  0.125 mg Oral Daily  . docusate sodium  100 mg Oral BID  . enoxaparin (LOVENOX) injection  40 mg Subcutaneous Q24H  . folic acid  1 mg Oral Daily  . ipratropium  0.5 mg Nebulization QID  . levalbuterol  0.63 mg Nebulization QID  . losartan  12.5 mg Oral Daily  . magnesium oxide  400 mg Oral BID  . predniSONE  40 mg Oral Q breakfast  . sodium chloride  10-40 mL Intracatheter Q12H  . spironolactone  12.5 mg Oral Daily  . sucralfate  1 g Oral TID WC & HS  . traZODone  50 mg Oral QHS  . vancomycin  750 mg Intravenous Q12H    Time spent on care of this patient: 15 mins   Dontavious Emily T , MD   [facility] Office  [phone] Pager - Text Page per Shea Evans as per below:  On-Call/Text Page:      [email or web address]      password [email or web address]  If 7PM-7AM, please contact night-coverage [email or web address] Password [email or web address] 09/20/2014, 1:43 PM   LOS: 8 days

## 2014-09-20 NOTE — Progress Notes (Signed)
SUBJECTIVE:   Linda Donovan 59 yo female with PMH lung cancer, severe COPD, prior pericardial window for effusion for evaluation of dyspnea and elevated troponin. Patient is undergoing radiation therapy and chemotherapy for recurrent lung cancer. Following radiation yesterday she developed worsening dyspnea. CXR suggestive of RLL pneumonia/pneumonitis and RUL with invasive carcinoma.   Left ventricular systolic dysfunction, new since August 2015 possibly secondary to chemotherapy.  EF by MRI was 26% and RVF EF 36%. Mild to moderate TR with mild Pulmonary HTN also noted. 09/13/2014  ECHO EF 25-30% Mild RV HK. RVSP 60  Had PICC placed with adequate CO-OX--> 69%. CVP 5-7.   Feeling a bit better. HR now 115-120. Seen by Palliative Care. Now DNR. Wants to see her daughter graduate from [employer] in May.   . ALPRAZolam  0.5 mg Oral TID  . antiseptic oral rinse  7 mL Mouth Rinse BID  . aspirin  81 mg Oral Daily  . budesonide-formoterol  2 puff Inhalation BID  . ceFEPime (MAXIPIME) IV  1 g Intravenous 3 times per day  . digoxin  0.125 mg Oral Daily  . docusate sodium  100 mg Oral BID  . enoxaparin (LOVENOX) injection  40 mg Subcutaneous Q24H  . folic acid  1 mg Oral Daily  . ipratropium  0.5 mg Nebulization QID  . levalbuterol  0.63 mg Nebulization QID  . magnesium oxide  400 mg Oral BID  . predniSONE  40 mg Oral Q breakfast  . sodium chloride  10-40 mL Intracatheter Q12H  . spironolactone  12.5 mg Oral Daily  . sucralfate  1 g Oral TID WC & HS  . traZODone  50 mg Oral QHS  . vancomycin  750 mg Intravenous Q12H   OBJECTIVE:   Vitals:   Filed Vitals:   09/20/14 0723 09/20/14 0800 09/20/14 0854 09/20/14 0900  BP: 101/67 124/77    Pulse: 117 114  120  Temp: 98.2 F (36.8 C)     TempSrc: Oral     Resp: 24 23  30   Height:      Weight:      SpO2: 97% 100% 96% 98%   I&O's:    Intake/Output Summary (Last 24 hours) at 09/20/14 1037 Last data filed at 09/20/14 0900  Gross per 24 hour    Intake    720 ml  Output   2500 ml  Net  -1780 ml   TELEMETRY: 130-140s.    PHYSICAL EXAM General: IChronically ill appearing. NAD Head: Eyes PERRLA, No xanthomas.   Normal cephalic and atramatic  Lungs:  Poor air movement. Rhonchi on RUL. Upper airway gurgling Heart:   Distant. Tachy regular. No obvious s3  Abdomen: Bowel sounds are positive, abdomen soft and non-tender without masses  Extremities:   No clubbing, cyanosis or edema.  DP +1 Neuro: Alert and oriented X 3. Psych:  Good affect, responds appropriately   LABS: Basic Metabolic Panel:  Recent Labs  09/18/14 0219 09/19/14 0500  NA 139 139  K 4.3 3.3*  CL 93* 89*  CO2 38* 46*  GLUCOSE 116* 133*  BUN 16 14  CREATININE 0.51 0.44*  CALCIUM 8.8 8.3*  MG  --  1.8   Liver Function Tests: No results for input(s): AST, ALT, ALKPHOS, BILITOT, PROT, ALBUMIN in the last 72 hours. No results for input(s): LIPASE, AMYLASE in the last 72 hours. CBC:  Recent Labs  09/19/14 0500  WBC 11.0*  HGB 10.0*  HCT 31.3*  MCV 103.6*  PLT 412*  Cardiac Enzymes: No results for input(s): CKTOTAL, CKMB, CKMBINDEX, TROPONINI in the last 72 hours. BNP: Invalid input(s): POCBNP D-Dimer: No results for input(s): DDIMER in the last 72 hours. Hemoglobin A1C: No results for input(s): HGBA1C in the last 72 hours. Fasting Lipid Panel: No results for input(s): CHOL, HDL, LDLCALC, TRIG, CHOLHDL, LDLDIRECT in the last 72 hours. Thyroid Function Tests: No results for input(s): TSH, T4TOTAL, T3FREE, THYROIDAB in the last 72 hours.  Invalid input(s): FREET3 Anemia Panel: No results for input(s): VITAMINB12, FOLATE, FERRITIN, TIBC, IRON, RETICCTPCT in the last 72 hours. Coag Panel:   Lab Results  Component Value Date   INR 1.01 01/03/2014   INR 0.89 09/04/2012    RADIOLOGY: Ct Angio Chest Pe W/cm &/or Wo Cm  09/12/2014   CLINICAL DATA:  History of recurrent lung cancer with increasing shortness of breath. Patient is currently  being treated for potential pneumonia.  EXAM: CT ANGIOGRAPHY CHEST WITH CONTRAST  TECHNIQUE: Multidetector CT imaging of the chest was performed using the standard protocol during bolus administration of intravenous contrast. Multiplanar CT image reconstructions and MIPs were obtained to evaluate the vascular anatomy.  CONTRAST:  67mL OMNIPAQUE IOHEXOL 350 MG/ML SOLN  COMPARISON:  July 17, 2014  FINDINGS: There is no pulmonary embolus. There is atherosclerosis of the aorta without aortic dissection or aneurysmal dilatation. The heart size is normal. There is no pericardial effusion. Mediastinal and subcarinal lymph nodes are unchanged.  Ill-defined masslike opacity is seen in the posterior right upper lung field with involvement of the right hilum unchanged compared to prior exam. There is interval developed patchy airspace opacity involving the right upper lobe, differential diagnosis includes interval developed pneumonia or radiation pneumonitis if there has been interval radiation since February 2016. There is interval increase of small to moderate right pleural effusion. The left lung is unchanged with emphysematous changes.  The visualized upper abdominal structures are unremarkable. There is a small hiatal hernia. No focal lytic or blastic lesion is identified within the visualized bones.  Review of the MIP images confirms the above findings.  IMPRESSION: Ill-defined masslike opacity identified in the right upper lung field with involvement of the right hilum consistent with patient's known recurrent lung cancer without changed compared to prior CT.  Interval developed patchy airspace opacity involving the right upper lobe, differential diagnosis includes interval foot bowel pneumonia or radiation pneumonitis if there has been interval radiation since February 2016.  Interval increase small to moderate right pleural effusion.   Electronically Signed   By: Abelardo Diesel M.D.   On: 09/12/2014 20:16   Dg  Chest Portable 1 View  09/18/2014   CLINICAL DATA:  Shortness of breath, COPD.  Radiation pneumonitis.  EXAM: PORTABLE CHEST - 1 VIEW  COMPARISON:  09/16/2014  FINDINGS: Further improvement in right mid and lower lung airspace opacities. Continued dense opacification of the right upper lobe. Scarring noted in the lingula, stable. Underlying COPD. Heart is normal size. No acute bony abnormality.  IMPRESSION: Further improvement in right mid and lower lung airspace opacities, likely improving pneumonia/pneumonitis. Stable dense right apical opacity.  COPD.   Electronically Signed   By: Rolm Baptise M.D.   On: 09/18/2014 12:19   Dg Chest Port 1 View  09/16/2014   CLINICAL DATA:  59 year old female with a history of pneumonia and shortness of breath. Known lung carcinoma.  EXAM: PORTABLE CHEST - 1 VIEW  COMPARISON:  CT chest 09/12/2014, chest x-ray 09/12/2014, PET-CT 08/12/2014, chest x-ray 08/01/2014  FINDINGS: Cardiac  size unchanged.  Contour of the mediastinum is similar, with obscuration of the right mediastinum secondary to right upper lobe/hilar consolidation/mass, characterized previously on the CT and PET-CT.  Elevation of the minor fissure.  Improved appearance of airspace opacities and interlobular septal thickening of the right lower lobe compared to the prior chest x-ray.  Right pleural parenchymal opacity at the apex persists as well as airspace disease of the right upper lobe.  No left-sided consolidative airspace disease. Similar appearance of the interstitial opacities at the base.  Persistent blunting of the right costophrenic angle. No large with left-sided pleural effusion.  No displaced fracture.  IMPRESSION: Improved airspace and interstitial opacities of the right lower lung, suggesting improved pneumonia/ pneumonitis.  Persistent right hilar and right upper lobe consolidative changes compatible with the patient's known invasive carcinoma. These changes were better characterized on recent CT  chest.  Trace right-sided pleural effusion.  Signed,  Dulcy Fanny. Earleen Newport, DO  [facility]  [facility]   Electronically Signed   By: Corrie Mckusick D.O.   On: 09/16/2014 14:05   Dg Chest Port 1 View  09/12/2014   CLINICAL DATA:  Short of breath for 2 days. Chest congestion and cough for 1 month. Lung cancer with recurrence. COPD and asthma.  EXAM: PORTABLE CHEST - 1 VIEW  COMPARISON:  08/01/2014 and PET of 08/12/2014.  FINDINGS: Numerous leads and wires project over the chest. Tracheal deviation to the right with mediastinal shift due to volume loss within the right hemithorax. Mild cardiomegaly. Trace right pleural fluid or thickening, blunting the right costophrenic angle. No pneumothorax. Right apical pleural thickening. Right upper lobe masslike opacity is similar to slightly decreased. There is new right mid and lower lung interstitial and airspace disease, especially laterally. Clear left lung with interstitial thickening, lower lobe predominant.  IMPRESSION: Slight improvement in right upper lobe/ apical aeration. This was consistent with recurrent tumor on the 08/12/2014 PET. Development of interstitial and airspace disease in the right mid and lower lung. Considerations include radiation pneumonitis (if there is been interval radiation since 08/01/2014) or infection. Lymphangitic tumor spread is felt less likely, given time course.   Electronically Signed   By: Abigail Miyamoto M.D.   On: 09/12/2014 19:00   Mr Card Morphology W/o Cm  09/16/2014   CLINICAL DATA:  59 year old female  EXAM: CARDIAC MRI  TECHNIQUE: The patient was scanned on a 1.5 Tesla GE magnet. A dedicated cardiac coil was used. Functional imaging was done using Fiesta sequences. 2,3, and 4 chamber views were done to assess for RWMA's. Modified Simpson's rule using a short axis stack was used to calculate an ejection fraction on a dedicated work Conservation officer, nature. The patient received  no contrast. After 10 minutes inversion recovery sequences were used to assess for infiltration and scar tissue.  CONTRAST:  None.  FINDINGS: This study is of limited quality secondary to tachycardia, tachypnea and patient's inability to hold breath.  1. Normal left ventricular size, thickness and severely decreased systolic function (LVEF = 26%). Systolic function might be underestimated secondary to significant tachycardia during acquisition.  There is diffuse hypokinesis.  LVEDV:  152 ml  LVESV:  112 ml  SV:  40 ml  CO:  3.6 L/minute  2. Normal ventricular size, thickness and mildly to moderately reduced systolic function (RVEF = 34%).  3.  Normal biatrial size.  4.  Mild mitral and mild to moderate tricuspid regurgitation.  5.  Normal size  of the aortic root and ascending aorta.  6. Moderately dilated pulmonary artery (38 mm) suggestive of significant pulmonary hypertension.  IMPRESSION: 1. Normal left ventricular size, thickness and severely decreased systolic function (LVEF = 26%). Systolic function might be underestimated secondary to significant tachycardia during acquisition.  There is diffuse hypokinesis.  2. Normal ventricular size, thickness and mildly to moderately reduced systolic function (RVEF = 34%).  3.  Normal biatrial size.  4.  Mild mitral and mild to moderate tricuspid regurgitation.  5.  Normal size of the aortic root and ascending aorta.  6. Moderately dilated pulmonary artery (38 mm) suggestive of significant pulmonary hypertension.  Ena Dawley   Electronically Signed   By: Ena Dawley   On: 09/16/2014 15:37   ASSESSMENT AND PLAN 1. A/C Systolic HF-  Left ventricular systolic dysfunction, new since August 2015 . possibly secondary to chemotherapy>  EF by MRI was 26% and RVF EF 36%.   Mild to moderate TR with mild Pulmonary HTN also noted.  Remains SOB at rest but not related to volume overload.  2. Acute on chronic respiratory failure 3. Advanced COPD  4. Recurrent  non-small cell lung cancer 5. Radiation pnuemonitis 6. Mildly elevated troponin . most likely secondary to COPD exacerbation with acute CHF and tachycardia.   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 Now complicated by severe LV dysfunction and marked tachycardia. Co-ox suggests adequate cardiac output and thus sinus tachycardia likely not due to low output more reactive to underlying illness. Given severe COPD and respiratory distress, I am reluctant to b-block at this time. Volume status looks ok. Continue spiro and digoxin. Start low dose losartan 12.5 as BP tolerates.     Daniel Bensimhon,MD 10:37 AM

## 2014-09-20 NOTE — Progress Notes (Signed)
Notified by [facility] Elissa Hefty of pt./family request for [facility] services at home after discharge. Chart and patient information being reviewed.  Writer spoke wih pt. and her mother at the bedside to initiate education related to hospice philosophy, services and team approach to care. Family voiced  understanding of information provided. Per discussion plan is for discharge to home by personal vehicle with her husband when medically stable. Patient will have her husband bring her portable O2 tank in for use at discharge.  Please send signed and completed DNR form home with pt/family. Pt. will need prescriptions for discharge comfort medications.  DME needs discussed and pt. requests an electric bed, table, APP, BSC and w/c. Pt. Has a nebulizer and O2 at home. Request placed with [place] equipment manager for delivery tomorrow. She will contact [place] with the request. The  home address has been verified. Pt's mother and husband are the contact persons.  [facility] aware of above. [facility] information and contact numbers have been given to the patient during the visit. Above information shared with [facility]. Please call with any questions.  [facility] Liaison [phone]  244 pm Hospice eligibility confirmed with Dr. Clifton James [facility] Medical Director.

## 2014-09-21 MED ORDER — LEVALBUTEROL HCL 0.63 MG/3ML IN NEBU: 0.6300 mg | INHALATION_SOLUTION | Freq: Four times a day (QID) | RESPIRATORY_TRACT | Status: DC

## 2014-09-21 MED ORDER — POTASSIUM CHLORIDE CRYS ER 20 MEQ PO TBCR
40.0000 meq | EXTENDED_RELEASE_TABLET | Freq: Once | ORAL | Status: AC
  Administered 2014-09-21: 40 meq via ORAL
  Filled 2014-09-21: qty 2

## 2014-09-21 MED ORDER — IPRATROPIUM BROMIDE 0.02 % IN SOLN: 0.5000 mg | Freq: Four times a day (QID) | RESPIRATORY_TRACT | Status: DC

## 2014-09-21 MED ORDER — SPIRONOLACTONE 25 MG PO TABS: 12.5000 mg | ORAL_TABLET | Freq: Every day | ORAL | Status: DC

## 2014-09-21 MED ORDER — LOSARTAN POTASSIUM 25 MG PO TABS: 12.5000 mg | ORAL_TABLET | Freq: Every day | ORAL | Status: DC

## 2014-09-21 MED ORDER — DIPHENHYDRAMINE HCL 25 MG PO CAPS
25.0000 mg | ORAL_CAPSULE | Freq: Once | ORAL | Status: AC
  Administered 2014-09-21: 25 mg via ORAL
  Filled 2014-09-21: qty 1

## 2014-09-21 MED ORDER — DOCUSATE SODIUM 100 MG PO CAPS: 100.0000 mg | ORAL_CAPSULE | Freq: Two times a day (BID) | ORAL | Status: AC

## 2014-09-21 MED ORDER — METHOCARBAMOL 500 MG PO TABS: 500.0000 mg | ORAL_TABLET | Freq: Four times a day (QID) | ORAL | Status: DC | PRN

## 2014-09-21 MED ORDER — AZITHROMYCIN 500 MG PO TABS: 500.0000 mg | ORAL_TABLET | Freq: Every day | ORAL | Status: DC

## 2014-09-21 MED ORDER — POLYETHYLENE GLYCOL 3350 17 G PO PACK: 17.0000 g | PACK | Freq: Every day | ORAL | Status: AC | PRN

## 2014-09-21 MED ORDER — DIPHENHYDRAMINE HCL 25 MG PO CAPS
50.0000 mg | ORAL_CAPSULE | Freq: Four times a day (QID) | ORAL | Status: DC | PRN
  Administered 2014-09-21: 50 mg via ORAL
  Filled 2014-09-21: qty 2

## 2014-09-21 MED ORDER — MORPHINE SULFATE (CONCENTRATE) 10 MG /0.5 ML PO SOLN: 5.0000 mg | ORAL | Status: DC | PRN

## 2014-09-21 MED ORDER — CEPHALEXIN 500 MG PO CAPS: 500.0000 mg | ORAL_CAPSULE | Freq: Three times a day (TID) | ORAL | Status: AC

## 2014-09-21 MED ORDER — DIGOXIN 125 MCG PO TABS: 0.1250 mg | ORAL_TABLET | Freq: Every day | ORAL | Status: DC

## 2014-09-21 MED ORDER — ALPRAZOLAM 1 MG PO TABS: 1.0000 mg | ORAL_TABLET | Freq: Three times a day (TID) | ORAL | Status: AC

## 2014-09-21 MED ORDER — HYDROCODONE-HOMATROPINE 5-1.5 MG/5ML PO SYRP: 5.0000 mL | ORAL_SOLUTION | Freq: Four times a day (QID) | ORAL | Status: DC | PRN

## 2014-09-21 MED ORDER — PREDNISONE 5 MG PO TABS: ORAL_TABLET | ORAL | Status: DC

## 2014-09-21 NOTE — Progress Notes (Signed)
0619 Patient c/o itching all over. No rash noted. Kathline Magic NP notified and order received for benadryl. 0631 Benadryl 25mg  po given. Will continue to monitor.

## 2014-09-21 NOTE — Discharge Summary (Addendum)
Physician Discharge Summary  Linda Donovan MRN: [id number] DOB/AGE: 60/22/1957 60 y.o.  PCP: Pcp Not In System   Admit date: 09/12/2014 Discharge date: 09/21/2014  Discharge Diagnoses:     Principal Problem:   Right upper lobe consolidation Active Problems:   COPD  GOLD III   Non-small cell lung cancer   Acute on chronic respiratory failure with hypoxia   Dyspnea   Elevated troponin   DCM (dilated cardiomyopathy)   Acute systolic CHF (congestive heart failure)   Left ventricular dysfunction with reduced left ventricular function   DNR (do not resuscitate) discussion   Adjustment disorder with mixed anxiety and depressed mood   Palliative care encounter    Follow-up recommendations Patient discharged home with home [place] [phone]).  Julien Nordmann will be her hospice attending     Medication List    STOP taking these medications        albuterol (2.5 MG/3ML) 0.083% nebulizer solution  Commonly known as:  PROVENTIL     albuterol 108 (90 BASE) MCG/ACT inhaler  Commonly known as:  PROVENTIL HFA;VENTOLIN HFA     amphetamine-dextroamphetamine 20 MG tablet  Commonly known as:  ADDERALL     chlorpheniramine-HYDROcodone 10-8 MG/5ML Lqcr  Commonly known as:  TUSSIONEX     dexamethasone 4 MG tablet  Commonly known as:  DECADRON     metoprolol succinate 25 MG 24 hr tablet  Commonly known as:  TOPROL-XL      TAKE these medications        ALPRAZolam 1 MG tablet  Commonly known as:  XANAX  Take 1 tablet (1 mg total) by mouth 3 (three) times daily.     aspirin 81 MG tablet  Take 81 mg by mouth daily.     azithromycin 500 MG tablet  Commonly known as:  ZITHROMAX  Take 1 tablet (500 mg total) by mouth daily.     budesonide-formoterol 160-4.5 MCG/ACT inhaler  Commonly known as:  SYMBICORT  Inhale 2 puffs into the lungs 2 (two) times daily.     calcium-vitamin D 250-125 MG-UNIT per tablet  Commonly known as:  OSCAL WITH D   Take 1 tablet by mouth daily.     cephALEXin 500 MG capsule  Commonly known as:  KEFLEX  Take 1 capsule (500 mg total) by mouth 3 (three) times daily.     digoxin 0.125 MG tablet  Commonly known as:  LANOXIN  Take 1 tablet (0.125 mg total) by mouth daily.     docusate sodium 100 MG capsule  Commonly known as:  COLACE  Take 1 capsule (100 mg total) by mouth 2 (two) times daily.     folic acid 1 MG tablet  Commonly known as:  FOLVITE  Take 1 tablet (1 mg total) by mouth daily.     HYDROcodone-homatropine 5-1.5 MG/5ML syrup  Commonly known as:  HYCODAN  Take 5 mLs by mouth every 6 (six) hours as needed for cough.     ipratropium 0.02 % nebulizer solution  Commonly known as:  ATROVENT  Take 2.5 mLs (0.5 mg total) by nebulization 4 (four) times daily.     levalbuterol 0.63 MG/3ML nebulizer solution  Commonly known as:  XOPENEX  Take 3 mLs (0.63 mg total) by nebulization 4 (four) times daily.     losartan 25 MG tablet  Commonly known as:  COZAAR  Take 0.5 tablets (12.5 mg total) by mouth daily.     magnesium oxide 400 MG  tablet  Commonly known as:  MAG-OX  Take 1 tablet (400 mg total) by mouth daily.     morphine CONCENTRATE 10 mg / 0.5 ml concentrated solution  Take 0.25 mLs (5 mg total) by mouth every 4 (four) hours as needed for severe pain.     multivitamin with minerals tablet  Take 1 tablet by mouth daily.     oxyCODONE-acetaminophen 5-325 MG per tablet  Commonly known as:  PERCOCET/ROXICET  Take 1 tablet by mouth every 4 (four) hours as needed for severe pain.     OXYGEN  Inhale 2 L into the lungs continuous.     polyethylene glycol packet  Commonly known as:  MIRALAX / GLYCOLAX  Take 17 g by mouth daily as needed for mild constipation or moderate constipation.     Potassium 75 MG Tabs  Take 1 tablet by mouth daily.     predniSONE 5 MG tablet  Commonly known as:  DELTASONE  - 10 tablets for 3 days  - 9 tablets for 3 days  - 8 tablets for 3 days  -  7 tablets for 3 days  - 6 tablets for 3 days  - 5 tablets for 3 days  - 4 tablets for 3 days  - 3 tablets for 3 days  - 2 tablets for 3 days  - 1 tablet for 3 days     prochlorperazine 10 MG tablet  Commonly known as:  COMPAZINE  Take 1 tablet (10 mg total) by mouth every 6 (six) hours as needed for nausea or vomiting.     spironolactone 25 MG tablet  Commonly known as:  ALDACTONE  Take 0.5 tablets (12.5 mg total) by mouth daily.     sucralfate 1 G tablet  Commonly known as:  CARAFATE  Take 1 tablet (1 g total) by mouth 4 (four) times daily -  with meals and at bedtime. Dissolve in 10 ml of water prior to taking     traZODone 50 MG tablet  Commonly known as:  DESYREL  Take 50 mg by mouth at bedtime.     zinc gluconate 50 MG tablet  Take 50 mg by mouth daily.        Discharge Condition:  Disposition: 01-Home or Self Care   Consults:  Cardiology Pulmonary    Significant Diagnostic Studies: Ct Angio Chest Pe W/cm &/or Wo Cm  09/12/2014   CLINICAL DATA:  History of recurrent lung cancer with increasing shortness of breath. Patient is currently being treated for potential pneumonia.  EXAM: CT ANGIOGRAPHY CHEST WITH CONTRAST  TECHNIQUE: Multidetector CT imaging of the chest was performed using the standard protocol during bolus administration of intravenous contrast. Multiplanar CT image reconstructions and MIPs were obtained to evaluate the vascular anatomy.  CONTRAST:  38mL OMNIPAQUE IOHEXOL 350 MG/ML SOLN  COMPARISON:  July 17, 2014  FINDINGS: There is no pulmonary embolus. There is atherosclerosis of the aorta without aortic dissection or aneurysmal dilatation. The heart size is normal. There is no pericardial effusion. Mediastinal and subcarinal lymph nodes are unchanged.  Ill-defined masslike opacity is seen in the posterior right upper lung field with involvement of the right hilum unchanged compared to prior exam. There is interval developed patchy airspace  opacity involving the right upper lobe, differential diagnosis includes interval developed pneumonia or radiation pneumonitis if there has been interval radiation since February 2016. There is interval increase of small to moderate right pleural effusion. The left lung is unchanged with emphysematous changes.  The visualized upper abdominal structures are unremarkable. There is a small hiatal hernia. No focal lytic or blastic lesion is identified within the visualized bones.  Review of the MIP images confirms the above findings.  IMPRESSION: Ill-defined masslike opacity identified in the right upper lung field with involvement of the right hilum consistent with patient's known recurrent lung cancer without changed compared to prior CT.  Interval developed patchy airspace opacity involving the right upper lobe, differential diagnosis includes interval foot bowel pneumonia or radiation pneumonitis if there has been interval radiation since February 2016.  Interval increase small to moderate right pleural effusion.   Electronically Signed   By: Abelardo Diesel M.D.   On: 09/12/2014 20:16   Dg Chest Portable 1 View  09/18/2014   CLINICAL DATA:  Shortness of breath, COPD.  Radiation pneumonitis.  EXAM: PORTABLE CHEST - 1 VIEW  COMPARISON:  09/16/2014  FINDINGS: Further improvement in right mid and lower lung airspace opacities. Continued dense opacification of the right upper lobe. Scarring noted in the lingula, stable. Underlying COPD. Heart is normal size. No acute bony abnormality.  IMPRESSION: Further improvement in right mid and lower lung airspace opacities, likely improving pneumonia/pneumonitis. Stable dense right apical opacity.  COPD.   Electronically Signed   By: Rolm Baptise M.D.   On: 09/18/2014 12:19   Dg Chest Port 1 View  09/16/2014   CLINICAL DATA:  59 year old female with a history of pneumonia and shortness of breath. Known lung carcinoma.  EXAM: PORTABLE CHEST - 1 VIEW  COMPARISON:  CT chest  09/12/2014, chest x-ray 09/12/2014, PET-CT 08/12/2014, chest x-ray 08/01/2014  FINDINGS: Cardiac size unchanged.  Contour of the mediastinum is similar, with obscuration of the right mediastinum secondary to right upper lobe/hilar consolidation/mass, characterized previously on the CT and PET-CT.  Elevation of the minor fissure.  Improved appearance of airspace opacities and interlobular septal thickening of the right lower lobe compared to the prior chest x-ray.  Right pleural parenchymal opacity at the apex persists as well as airspace disease of the right upper lobe.  No left-sided consolidative airspace disease. Similar appearance of the interstitial opacities at the base.  Persistent blunting of the right costophrenic angle. No large with left-sided pleural effusion.  No displaced fracture.  IMPRESSION: Improved airspace and interstitial opacities of the right lower lung, suggesting improved pneumonia/ pneumonitis.  Persistent right hilar and right upper lobe consolidative changes compatible with the patient's known invasive carcinoma. These changes were better characterized on recent CT chest.  Trace right-sided pleural effusion.  Signed,  Dulcy Fanny. Earleen Newport, DO  [facility]  [facility]   Electronically Signed   By: Corrie Mckusick D.O.   On: 09/16/2014 14:05   Dg Chest Port 1 View  09/12/2014   CLINICAL DATA:  Short of breath for 2 days. Chest congestion and cough for 1 month. Lung cancer with recurrence. COPD and asthma.  EXAM: PORTABLE CHEST - 1 VIEW  COMPARISON:  08/01/2014 and PET of 08/12/2014.  FINDINGS: Numerous leads and wires project over the chest. Tracheal deviation to the right with mediastinal shift due to volume loss within the right hemithorax. Mild cardiomegaly. Trace right pleural fluid or thickening, blunting the right costophrenic angle. No pneumothorax. Right apical pleural thickening. Right upper lobe masslike opacity is similar to slightly  decreased. There is new right mid and lower lung interstitial and airspace disease, especially laterally. Clear left lung with interstitial thickening, lower lobe predominant.  IMPRESSION: Slight improvement in right  upper lobe/ apical aeration. This was consistent with recurrent tumor on the 08/12/2014 PET. Development of interstitial and airspace disease in the right mid and lower lung. Considerations include radiation pneumonitis (if there is been interval radiation since 08/01/2014) or infection. Lymphangitic tumor spread is felt less likely, given time course.   Electronically Signed   By: Abigail Miyamoto M.D.   On: 09/12/2014 19:00   Mr Card Morphology W/o Cm  09/16/2014   CLINICAL DATA:  59 year old female  EXAM: CARDIAC MRI  TECHNIQUE: The patient was scanned on a 1.5 Tesla GE magnet. A dedicated cardiac coil was used. Functional imaging was done using Fiesta sequences. 2,3, and 4 chamber views were done to assess for RWMA's. Modified Simpson's rule using a short axis stack was used to calculate an ejection fraction on a dedicated work [employer] officer, nature. The patient received no contrast. After 10 minutes inversion recovery sequences were used to assess for infiltration and scar tissue.  CONTRAST:  None.  FINDINGS: This study is of limited quality secondary to tachycardia, tachypnea and patient's inability to hold breath.  1. Normal left ventricular size, thickness and severely decreased systolic function (LVEF = 26%). Systolic function might be underestimated secondary to significant tachycardia during acquisition.  There is diffuse hypokinesis.  LVEDV:  152 ml  LVESV:  112 ml  SV:  40 ml  CO:  3.6 L/minute  2. Normal ventricular size, thickness and mildly to moderately reduced systolic function (RVEF = 34%).  3.  Normal biatrial size.  4.  Mild mitral and mild to moderate tricuspid regurgitation.  5.  Normal size of the aortic root and ascending aorta.  6. Moderately dilated pulmonary artery (38  mm) suggestive of significant pulmonary hypertension.  IMPRESSION: 1. Normal left ventricular size, thickness and severely decreased systolic function (LVEF = 26%). Systolic function might be underestimated secondary to significant tachycardia during acquisition.  There is diffuse hypokinesis.  2. Normal ventricular size, thickness and mildly to moderately reduced systolic function (RVEF = 34%).  3.  Normal biatrial size.  4.  Mild mitral and mild to moderate tricuspid regurgitation.  5.  Normal size of the aortic root and ascending aorta.  6. Moderately dilated pulmonary artery (38 mm) suggestive of significant pulmonary hypertension.  Ena Dawley   Electronically Signed   By: Ena Dawley   On: 09/16/2014 15:37     Microbiology: Recent Results (from the past 240 hour(s))  Blood culture (routine x 2)     Status: None   Collection Time: 09/12/14  9:08 PM  Result Value Ref Range Status   Specimen Description BLOOD R FOREARM  Final   Special Requests BOTTLES DRAWN AEROBIC AND ANAEROBIC 5CC  Final   Culture   Final    NO GROWTH 5 DAYS Performed at [facility]    Report Status 09/19/2014 FINAL  Final  Blood culture (routine x 2)     Status: None   Collection Time: 09/12/14  9:15 PM  Result Value Ref Range Status   Specimen Description BLOOD RIGHT ARM  Final   Special Requests BOTTLES DRAWN AEROBIC AND ANAEROBIC 4CC  Final   Culture   Final    NO GROWTH 5 DAYS Performed at [facility]    Report Status 09/19/2014 FINAL  Final  Culture, sputum-assessment     Status: None   Collection Time: 09/13/14  7:27 AM  Result Value Ref Range Status   Specimen Description SPUTUM  Final  Special Requests NONE  Final   Sputum evaluation   Final    THIS SPECIMEN IS ACCEPTABLE. RESPIRATORY CULTURE REPORT TO FOLLOW.   Report Status 09/13/2014 FINAL  Final  Culture, respiratory (NON-Expectorated)     Status: None   Collection Time: 09/13/14  7:27 AM  Result Value Ref Range  Status   Specimen Description SPUTUM  Final   Special Requests NONE  Final   Gram Stain   Final    RARE WBC PRESENT, PREDOMINANTLY PMN RARE SQUAMOUS EPITHELIAL CELLS PRESENT FEW GRAM POSITIVE COCCI IN PAIRS IN CHAINS IN CLUSTERS RARE GRAM POSITIVE RODS RARE GRAM NEGATIVE RODS    Culture   Final    NORMAL OROPHARYNGEAL FLORA Performed at [facility]    Report Status 09/15/2014 FINAL  Final  MRSA PCR Screening     Status: None   Collection Time: 09/17/14  1:06 PM  Result Value Ref Range Status   MRSA by PCR NEGATIVE NEGATIVE Final    Comment:        The GeneXpert MRSA Assay (FDA approved for NASAL specimens only), is one component of a comprehensive MRSA colonization surveillance program. It is not intended to diagnose MRSA infection nor to guide or monitor treatment for MRSA infections.      Labs: No results found for this or any previous visit (from the past 48 hour(s)).   HPI :Linda Donovan 59 yo female with PMH lung cancer, severe COPD, prior pericardial window for effusion for evaluation of dyspnea and elevated troponin. Patient is undergoing radiation therapy and chemotherapy for recurrent lung cancer. Following radiation yesterday she developed worsening dyspnea. CXR suggestive of RLL pneumonia/pneumonitis and RUL with invasive carcinoma.   Left ventricular systolic dysfunction, new since August 2015 possibly secondary to chemotherapy.  EF by MRI was 26% and RVF EF 36%. Mild to moderate TR with mild Pulmonary HTN also noted. 09/13/2014 ECHO EF 25-30% Mild RV HK. RVSP 60  Troponins were positive at the time of admission, and trended up to 0.72. Cardiology was consulted. TTE showed EF of 25-30% with severe pulmonary hypertension, PA pressure 60 mmHg. 2-D echo in 8/15 had shown EF of 55-60%. Cardiac MRI showed severely decreased left ventricular systolic function (EF 35%), diffuse hypokinesis, moderately reduced right-sided systolic function, RVEF 00% with  mild-to-moderate tricuspid regurgitation, and significant pulmonary hypertension. The patient was persistnetly tachypneic, and borderline hypotensive with tachycardia.  On 4/12 the pt was transferred to [facility] for consult w the [place]: Acute on chronic hypoxic respiratory failure - dyspnea  Multifactorial w/ primary contributor seemingly her lung disease (severe emphysema + advanced lung CA + radiaton pneumonitis) w/ secondary contribution from new CHF -  Patient will be discharged with home oxygen at least 4 L to 6 L continuous Avoid beta blocker, not an diuresis because of low blood pressure  RLL HCAP v/s radiation pneumonitis  As per Pulmonary "wean   Prednisone with slow taper over 14 days" - plan to complete 5 day course of empiric abx , on cefepime and vancomycin, now transition to azithromycin and Keflex  Severe COPD exacerbation, acute on chronic hypoxic respiratory failure No evidence of active wheeze on exam today - as per Pulmonary "add back home symbicort, continue xopenex" Continue outpatient slow Prednisone taper  Advanced RUL Non-small cell lung cancer On XRT prior to admit - followed by Dr. Julien Nordmann, who had reportedly previously informed pt her 5 year survival rate was <10% - pt no  longer wishes to pursue XRT or chemotherarpy - Palliative Care following and consulting Hospice - prior chemo and XRT 2014   Acute on chronic severe systolic CHF ?due to chemo tx v/s ischemic -  Seen by cardiology today,Given severe COPD and respiratory distress, will avoid beta blocker for now. Will consider later. Continue dig, spironolactone and ARB.   Sinus Tachycardia Likely secondary to deconditioning and comorbidities    Discharge Exam:   Blood pressure 101/62, pulse 120, temperature 98.1 F (36.7 C), temperature source Oral, resp. rate 16, height 5\' 3"  (1.6 m), weight 59.24 kg (130 lb 9.6 oz), SpO2 99 %. General: No acute  respiratory distress sitting up in bed  Lungs: respirations are comfortable and calm Ext: No appreciable edema         Discharge Instructions    Diet - low sodium heart healthy    Complete by:  As directed      Increase activity slowly    Complete by:  As directed            Follow-up Information    Follow up with [facility] Gilbert K., MD. Schedule an appointment as soon as possible for a visit in 1 week.   Specialty:  Oncology   Contact information:   [address] [place] [address] [phone]       Signed: Reyne Dumas 09/21/2014, 10:13 AM

## 2014-09-21 NOTE — Progress Notes (Signed)
PICC Line removed per IV Team. Discharge instructions given and explained with teach back, however, patient is allergic to Morphine which has been ordered for home. Text page sent to Dr. Allyson Sabal regarding allergy.

## 2014-09-21 NOTE — Progress Notes (Signed)
SUBJECTIVE:   Linda Donovan 59 yo female with PMH lung cancer, severe COPD, prior pericardial window for effusion for evaluation of dyspnea and elevated troponin. Patient is undergoing radiation therapy and chemotherapy for recurrent lung cancer. Following radiation yesterday she developed worsening dyspnea. CXR suggestive of RLL pneumonia/pneumonitis and RUL with invasive carcinoma.   Left ventricular systolic dysfunction, new since August 2015 possibly secondary to chemotherapy.  EF by MRI was 26% and RVF EF 36%. Mild to moderate TR with mild Pulmonary HTN also noted. 09/13/2014  ECHO EF 25-30% Mild RV HK. RVSP 60  Had PICC placed with adequate CO-OX--> 69%. CVP 5-7.   Patient denies CP; dyspnea improved  . ALPRAZolam  1 mg Oral TID  . antiseptic oral rinse  7 mL Mouth Rinse BID  . aspirin  81 mg Oral Daily  . budesonide-formoterol  2 puff Inhalation BID  . ceFEPime (MAXIPIME) IV  1 g Intravenous 3 times per day  . digoxin  0.125 mg Oral Daily  . docusate sodium  100 mg Oral BID  . enoxaparin (LOVENOX) injection  40 mg Subcutaneous Q24H  . folic acid  1 mg Oral Daily  . ipratropium  0.5 mg Nebulization QID  . levalbuterol  0.63 mg Nebulization QID  . losartan  12.5 mg Oral Daily  . magnesium oxide  400 mg Oral BID  . potassium chloride  40 mEq Oral Once  . predniSONE  40 mg Oral Q breakfast  . sodium chloride  10-40 mL Intracatheter Q12H  . spironolactone  12.5 mg Oral Daily  . sucralfate  1 g Oral TID WC & HS  . traZODone  50 mg Oral QHS  . vancomycin  750 mg Intravenous Q12H   OBJECTIVE:   Vitals:   Filed Vitals:   09/20/14 1711 09/20/14 1953 09/20/14 2136 09/21/14 0502  BP:   79/62 101/62  Pulse:   137 109  Temp:   98.2 F (36.8 C) 98.1 F (36.7 C)  TempSrc:   Oral Oral  Resp:   20 16  Height:      Weight:    130 lb 9.6 oz (59.24 kg)  SpO2: 98% 98% 100% 100%   I&O's:    Intake/Output Summary (Last 24 hours) at 09/21/14 0852 Last data filed at 09/21/14 0500  Gross per 24 hour  Intake    540 ml  Output   2000 ml  Net  -1460 ml   TELEMETRY: 130-140s.    PHYSICAL EXAM General: Chronically ill appearing. NAD Head: Normal Lungs:  Mild exp wheeze Heart:   Tachycardic Abdomen: Soft, NT/ND Extremities:   No edema.   Neuro: Grossly intact    LABS: Basic Metabolic Panel:  Recent Labs  09/19/14 0500  NA 139  K 3.3*  CL 89*  CO2 46*  GLUCOSE 133*  BUN 14  CREATININE 0.44*  CALCIUM 8.3*  MG 1.8  CBC:  Recent Labs  09/19/14 0500  WBC 11.0*  HGB 10.0*  HCT 31.3*  MCV 103.6*  PLT 412*   Anemia Panel: No results for input(s): VITAMINB12, FOLATE, FERRITIN, TIBC, IRON, RETICCTPCT in the last 72 hours. Coag Panel:   Lab Results  Component Value Date   INR 1.01 01/03/2014   INR 0.89 09/04/2012    RADIOLOGY: Ct Angio Chest Pe W/cm &/or Wo Cm  09/12/2014   CLINICAL DATA:  History of recurrent lung cancer with increasing shortness of breath. Patient is currently being treated for potential pneumonia.  EXAM: CT ANGIOGRAPHY CHEST WITH  CONTRAST  TECHNIQUE: Multidetector CT imaging of the chest was performed using the standard protocol during bolus administration of intravenous contrast. Multiplanar CT image reconstructions and MIPs were obtained to evaluate the vascular anatomy.  CONTRAST:  44mL OMNIPAQUE IOHEXOL 350 MG/ML SOLN  COMPARISON:  July 17, 2014  FINDINGS: There is no pulmonary embolus. There is atherosclerosis of the aorta without aortic dissection or aneurysmal dilatation. The heart size is normal. There is no pericardial effusion. Mediastinal and subcarinal lymph nodes are unchanged.  Ill-defined masslike opacity is seen in the posterior right upper lung field with involvement of the right hilum unchanged compared to prior exam. There is interval developed patchy airspace opacity involving the right upper lobe, differential diagnosis includes interval developed pneumonia or radiation pneumonitis if there has been interval  radiation since February 2016. There is interval increase of small to moderate right pleural effusion. The left lung is unchanged with emphysematous changes.  The visualized upper abdominal structures are unremarkable. There is a small hiatal hernia. No focal lytic or blastic lesion is identified within the visualized bones.  Review of the MIP images confirms the above findings.  IMPRESSION: Ill-defined masslike opacity identified in the right upper lung field with involvement of the right hilum consistent with patient's known recurrent lung cancer without changed compared to prior CT.  Interval developed patchy airspace opacity involving the right upper lobe, differential diagnosis includes interval foot bowel pneumonia or radiation pneumonitis if there has been interval radiation since February 2016.  Interval increase small to moderate right pleural effusion.   Electronically Signed   By: Abelardo Diesel M.D.   On: 09/12/2014 20:16   Dg Chest Portable 1 View  09/18/2014   CLINICAL DATA:  Shortness of breath, COPD.  Radiation pneumonitis.  EXAM: PORTABLE CHEST - 1 VIEW  COMPARISON:  09/16/2014  FINDINGS: Further improvement in right mid and lower lung airspace opacities. Continued dense opacification of the right upper lobe. Scarring noted in the lingula, stable. Underlying COPD. Heart is normal size. No acute bony abnormality.  IMPRESSION: Further improvement in right mid and lower lung airspace opacities, likely improving pneumonia/pneumonitis. Stable dense right apical opacity.  COPD.   Electronically Signed   By: Rolm Baptise M.D.   On: 09/18/2014 12:19   Dg Chest Port 1 View  09/16/2014   CLINICAL DATA:  59 year old female with a history of pneumonia and shortness of breath. Known lung carcinoma.  EXAM: PORTABLE CHEST - 1 VIEW  COMPARISON:  CT chest 09/12/2014, chest x-ray 09/12/2014, PET-CT 08/12/2014, chest x-ray 08/01/2014  FINDINGS: Cardiac size unchanged.  Contour of the mediastinum is similar, with  obscuration of the right mediastinum secondary to right upper lobe/hilar consolidation/mass, characterized previously on the CT and PET-CT.  Elevation of the minor fissure.  Improved appearance of airspace opacities and interlobular septal thickening of the right lower lobe compared to the prior chest x-ray.  Right pleural parenchymal opacity at the apex persists as well as airspace disease of the right upper lobe.  No left-sided consolidative airspace disease. Similar appearance of the interstitial opacities at the base.  Persistent blunting of the right costophrenic angle. No large with left-sided pleural effusion.  No displaced fracture.  IMPRESSION: Improved airspace and interstitial opacities of the right lower lung, suggesting improved pneumonia/ pneumonitis.  Persistent right hilar and right upper lobe consolidative changes compatible with the patient's known invasive carcinoma. These changes were better characterized on recent CT chest.  Trace right-sided pleural effusion.  Signed,  Dulcy Fanny. Earleen Newport,  DO  [facility]  [facility]   Electronically Signed   By: Corrie Mckusick D.O.   On: 09/16/2014 14:05   Dg Chest Port 1 View  09/12/2014   CLINICAL DATA:  Short of breath for 2 days. Chest congestion and cough for 1 month. Lung cancer with recurrence. COPD and asthma.  EXAM: PORTABLE CHEST - 1 VIEW  COMPARISON:  08/01/2014 and PET of 08/12/2014.  FINDINGS: Numerous leads and wires project over the chest. Tracheal deviation to the right with mediastinal shift due to volume loss within the right hemithorax. Mild cardiomegaly. Trace right pleural fluid or thickening, blunting the right costophrenic angle. No pneumothorax. Right apical pleural thickening. Right upper lobe masslike opacity is similar to slightly decreased. There is new right mid and lower lung interstitial and airspace disease, especially laterally. Clear left lung with interstitial thickening, lower  lobe predominant.  IMPRESSION: Slight improvement in right upper lobe/ apical aeration. This was consistent with recurrent tumor on the 08/12/2014 PET. Development of interstitial and airspace disease in the right mid and lower lung. Considerations include radiation pneumonitis (if there is been interval radiation since 08/01/2014) or infection. Lymphangitic tumor spread is felt less likely, given time course.   Electronically Signed   By: Abigail Miyamoto M.D.   On: 09/12/2014 19:00   Mr Card Morphology W/o Cm  09/16/2014   CLINICAL DATA:  59 year old female  EXAM: CARDIAC MRI  TECHNIQUE: The patient was scanned on a 1.5 Tesla GE magnet. A dedicated cardiac coil was used. Functional imaging was done using Fiesta sequences. 2,3, and 4 chamber views were done to assess for RWMA's. Modified Simpson's rule using a short axis stack was used to calculate an ejection fraction on a dedicated work Conservation officer, nature. The patient received no contrast. After 10 minutes inversion recovery sequences were used to assess for infiltration and scar tissue.  CONTRAST:  None.  FINDINGS: This study is of limited quality secondary to tachycardia, tachypnea and patient's inability to hold breath.  1. Normal left ventricular size, thickness and severely decreased systolic function (LVEF = 26%). Systolic function might be underestimated secondary to significant tachycardia during acquisition.  There is diffuse hypokinesis.  LVEDV:  152 ml  LVESV:  112 ml  SV:  40 ml  CO:  3.6 L/minute  2. Normal ventricular size, thickness and mildly to moderately reduced systolic function (RVEF = 34%).  3.  Normal biatrial size.  4.  Mild mitral and mild to moderate tricuspid regurgitation.  5.  Normal size of the aortic root and ascending aorta.  6. Moderately dilated pulmonary artery (38 mm) suggestive of significant pulmonary hypertension.  IMPRESSION: 1. Normal left ventricular size, thickness and severely decreased systolic function (LVEF  = 26%). Systolic function might be underestimated secondary to significant tachycardia during acquisition.  There is diffuse hypokinesis.  2. Normal ventricular size, thickness and mildly to moderately reduced systolic function (RVEF = 34%).  3.  Normal biatrial size.  4.  Mild mitral and mild to moderate tricuspid regurgitation.  5.  Normal size of the aortic root and ascending aorta.  6. Moderately dilated pulmonary artery (38 mm) suggestive of significant pulmonary hypertension.  Ena Dawley   Electronically Signed   By: Ena Dawley   On: 09/16/2014 15:37   ASSESSMENT AND PLAN 1. A/C Systolic HF-  Left ventricular systolic dysfunction, new since August 2015 . possibly secondary to chemotherapy>  EF by MRI was 26% and RVF EF  36%.   Mild to moderate TR with mild Pulmonary HTN also noted.  2. Acute on chronic respiratory failure 3.Advanced COPD  4. Recurrent non-small cell lung cancer 5. Radiation pnuemonitis 6.Mildly elevated troponin . Most likely secondary to COPD exacerbation with acute CHF and tachycardia.   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 Now complicated by severe LV dysfunction (probable chemotherapy mediated) and marked tachycardia. Given severe COPD and respiratory distress, will avoid beta blocker for now. Will consider later. Continue dig, spironolactone and ARB.     Queen Blossom 8:52 AM

## 2014-09-21 NOTE — Progress Notes (Signed)
CARE MANAGEMENT NOTE 09/21/2014  Patient:  Linda Donovan, Linda Donovan   Account Number:  [id number]  Date Initiated:  09/13/2014  Documentation initiated by:  Sunday Spillers  Subjective/Objective Assessment:   59 yo female admitted with PNA, history of lung cancer. PTA lived at home with spouse.     Action/Plan:   Home when stable. Has home 02.   Anticipated DC Date:  09/22/2014   Anticipated DC Plan:  [place]  CM consult      PAC Choice  HOSPICE   Choice offered to / List presented to:  C-1 Patient        [place] arranged  HH-1 RN  [place] agency  [facility] [place]   Status of service:  Completed, signed off Medicare Important Message given?   (If response is "NO", the following Medicare IM given date fields will be blank) Date Medicare IM given:   Medicare IM given by:   Date Additional Medicare IM given:   Additional Medicare IM given by:    Discharge Disposition:  [place]  Per UR Regulation:  Reviewed for med. necessity/level of care/duration of stay  If discussed at [place] of Stay Meetings, dates discussed:   09/19/2014    Comments:  09/21/2014 1400 NCM contacted [place] and made aware of scheduled home this evening. NCM spoke to husband, Davianna Deutschman # [phone] and he is waiting of delivery of DME between 3-7 pm and then he will pick up pt after delivery of DME. [facility] contact info placed on dc instructions for home. Husband to contact [facility] when he arrives home. Jonnie Finner RN CCM Case Mgmt phone [phone]   4/15  1106 debbie dowell rn,bsn spoke w pt and gave her list for hospcie agencies. pt chose hospice and pal care of [place]. ref faxed and lisa [place] w hosp an [facility]pal car [facility] [place] will see pt today.  09/17/14 Dessa Phi RN BSN NCM [phone] Transfer to [facility] for the Advanced Heart failure program-acute to acute.No anticipated d/c needs.  09/16/14 Dessa Phi RN  BSN NCM [phone] PNA, COPD, CHF,sinus tach.iv abx x2, iv solumedrol, 02,nebs.D/C plan home.

## 2014-09-23 ENCOUNTER — Telehealth: Payer: Self-pay | Admitting: Oncology

## 2014-09-23 ENCOUNTER — Ambulatory Visit: Payer: BLUE CROSS/BLUE SHIELD

## 2014-09-23 NOTE — Telephone Encounter (Signed)
Called and spoke to [place], Rhyen's husband.  She has been discharged home with hospice.  Elberta Fortis said Adeline does not want to finish her radiation treatments.  Verbalized agreement and advised him to call if they need anything.

## 2014-09-24 ENCOUNTER — Telehealth: Payer: Self-pay | Admitting: Nutrition

## 2014-09-24 ENCOUNTER — Encounter: Payer: Self-pay | Admitting: Radiation Oncology

## 2014-09-24 ENCOUNTER — Ambulatory Visit: Payer: BLUE CROSS/BLUE SHIELD

## 2014-09-24 ENCOUNTER — Telehealth: Payer: Self-pay | Admitting: *Deleted

## 2014-09-24 ENCOUNTER — Encounter: Payer: Self-pay | Admitting: Nutrition

## 2014-09-24 NOTE — Telephone Encounter (Signed)
PT. IS NOT TAKING MOST OF HER VITAMIN SUPPLEMENTS. SHE IS ALSO NOT TAKING HER MORPHINE DUE TO ITCHING. THIS NOTE WAS ROUTED TO DR.[place].

## 2014-09-24 NOTE — Telephone Encounter (Signed)
e

## 2014-09-24 NOTE — Progress Notes (Incomplete)
°  Radiation Oncology         [phone] ________________________________  Name: Linda Donovan MRN: [id number]  Date: 09/12/2014  DOB: 1955/10/30  End of Treatment Note    ICD-9-CM ICD-10-CM   1. Non-small cell lung cancer, right 162.9 C34.91     DIAGNOSIS: Recurrent Non-small cell lung cancer     Indication for treatment:  ***       Radiation treatment dates:   09/04/3014-09/12/2014  Site/dose:   ***  Beams/energy:   ***  Narrative: The patient tolerated radiation treatment relatively well.   ***  Plan: The patient has completed radiation treatment. The patient will return to radiation oncology clinic for routine followup in one month. I advised them to call or return sooner if they have any questions or concerns related to their recovery or treatment.  -----------------------------------  Blair Promise, PhD, MD

## 2014-09-24 NOTE — Progress Notes (Signed)
Provided patient with samples of Ensure Plus at her request.

## 2014-09-25 ENCOUNTER — Telehealth: Payer: Self-pay | Admitting: Oncology

## 2014-09-25 ENCOUNTER — Telehealth: Payer: Self-pay | Admitting: *Deleted

## 2014-09-25 NOTE — Telephone Encounter (Signed)
Spoke to pt she states she is not sure which appts to keep or not keep as she does not know what the plan is. Discussed with pt the Hopsice referral, pt states she and husband have been discussing possible immunotherapy instead of hospice and is nott sure about continuing radiation. Instructed pt to keep appt lab/ MD on 4/27. Pt did not get chemo and would not need to keep lab appt 4/22. Advised pt to keep Radiation appts to call and speak to Dr. Sondra Come or nurse regarding these appts and her concerns to continue treatment or not. Pt verbalized understanding confirmed upcoming appts.

## 2014-09-25 NOTE — Telephone Encounter (Signed)
Kaysen called and said she would like to talk to Dr. Sondra Come regarding whether she would benefit from finishing her radiation treatment.  She said she was scared to go back to the machine because she felt like she felt like she was "off" on her positioning on her last treatment.  She said after the treatment is when she sat up and was not able to breath.  Advised her that Dr. Sondra Come will be notified.

## 2014-09-25 NOTE — Telephone Encounter (Signed)
Message from pt on vm, returned call to pt, unable to reach.

## 2014-09-26 ENCOUNTER — Telehealth: Payer: Self-pay | Admitting: Cardiology

## 2014-09-26 ENCOUNTER — Telehealth: Payer: Self-pay | Admitting: *Deleted

## 2014-09-26 NOTE — Telephone Encounter (Signed)
Spoke to [employer] Loss adjuster, chartered) from Hospice. Dr. Earlie Server defers to Dr. Radford Pax to manage Cardiac issues/diaphoresis.  Patient most recently seen 4/7-4/16 while In-Patient at [facility]  Currently patient with fluid overload sx, including weight gain of 4 lbs over 2 days with SOB/labored breathing. [name] indicated that patient is stable and it is preferable to have Dr. Radford Pax to review when she arrives in clinic today at 3pm.  Dr. Radford Pax reviewed. She states that patient needs to be seen and assessed in person, either with Flex provider in office tomorrow or in ED. Attempted to call [name] at Hospice to provide her with the information from Dr. Radford Pax. LM on VM '@3'$ :35 pm.

## 2014-09-26 NOTE — Telephone Encounter (Signed)
Called Sheliah Plane, RN, from Hospice, to provide her with an addendum from Dr. Radford Pax.  Had to leave message on voicemail. Informed her that Dr. Radford Pax states she is not the Cardiologist of record for this patient, although she did see her in hospital once. Dr. Radford Pax advises that it may be Dr. Haroldine Laws. Provided a call back number if [name]/Hospice needs more information. Also specified that the Cardiac medical notes we have were faxed to Hospice this late afternoon for Dr. Lew Dawes review. Will route to Dr. Haroldine Laws as Juluis Rainier.

## 2014-09-26 NOTE — Telephone Encounter (Signed)
[name] from Hospice called back. Informed her of Dr. Theodosia Blender advisement that patient needs to go to ED or she can come tomorrow to see Flex provider in office. [name] is going to speak with family to see what decision they would like to make regarding ED or clinic appt. [name] will have family call back in AM if patient needs to be seen in clinic. Will route to Rosaria Ferries, PA, as FYI, in case patient needs to be seen tomorrow.

## 2014-09-26 NOTE — Telephone Encounter (Signed)
New message    Hospice nurse calling   Pt C/O swelling: STAT is pt has developed SOB within 24 hours  1. How long have you been experiencing swelling? Weight gain x 2 days   2. Where is the swelling located? Possible abd or chest .    3.  Are you currently taking a "fluid pill"?   4.  Are you currently SOB? On oxygen 4 liters   5.  Have you traveled recently? no    Increase congestion to her lung.   Weak productive cough. dyspnea at rest.  Oxygen  98 % on  4 liters.  Pulse 113 , 108/76 ,  wheezing at all lung fields.  resp 16 very labour.    Any suggestion to diaphoresis the patient.

## 2014-09-26 NOTE — Telephone Encounter (Signed)
Call returned to [place], RN at [facility] discussed pt has an appt on 4/27 with provider and per pt she is unsure if she would like to continue with Hopsice or try Immunotherapy. Discussed with [name] the pt advised she will speak with MD at appt regarding concerns and will then decide what the plan is.  [name], RN informed me she had a visit with the pt today and pt was having difficulty breathing, is currently on 4L O2, has recent 3lb weight gain which is mostly fluid. [name] voiced she was not aware pt is considering continuing any types of treatments, nor did pt or other family member discuss upcoming MD appt or possible immunotherapy.

## 2014-09-26 NOTE — Telephone Encounter (Signed)
Received call from [place], [place] @ [facility] informing Dr. Julien Nordmann re:  Pt has heart condition; pt has gained 4 lbs for past few days, has increased pulmonary edema, has shortness of breath.  Asked [name] if she had contacted pt's primary or cardiologist - [name] stated pt is between cardiologist now.  Asked if hospice physician would assist with pt's symptom, [name] stated since Dr. Julien Nordmann is the attending and will need to give orders. Message sent to Dr. Julien Nordmann and Stanton Kidney, desk nurse today. [name]'s  Phone     [phone]

## 2014-09-26 NOTE — Telephone Encounter (Signed)
[name], from Hospice called back. Dr. Earlie Server is requesting urgent release of Cardiac Medical Records so he can manage patient's cardiac symptoms. He does not feel that patient needs to be in ED given her status. He will be ordering Lasix x3 days for her at this time while awaiting Cardiac Records. [facility] is going to send Cardiac records over asap. Routed information to Dr. Radford Pax as Juluis Rainier.

## 2014-09-26 NOTE — Telephone Encounter (Signed)
This patient is followed by Advanced Heart Failure Team. Please give Dr. Haroldine Laws a call or Dr. Aundra Dubin. I saw her in initial consultation but she was then transferred to their care.

## 2014-09-27 ENCOUNTER — Telehealth: Payer: Self-pay

## 2014-09-27 ENCOUNTER — Other Ambulatory Visit: Payer: BLUE CROSS/BLUE SHIELD

## 2014-09-27 ENCOUNTER — Telehealth: Payer: Self-pay | Admitting: Oncology

## 2014-09-27 NOTE — Telephone Encounter (Signed)
Can we give her a dose of IV lasix at home?  If so, would like to give lasix '80mg'$  IV x 1 as soon as possible. The start lasix 40 mg po daily with KCl 20 meq daily.   Daniel Bensimhon,MD 12:11 AM

## 2014-09-27 NOTE — Telephone Encounter (Addendum)
Linda Donovan from [place] called regarding Linda Donovan.  She wanted to check for her physician if Linda Donovan is going to restart radiation.  Advised her that per Dr. Sondra Come, Linda Donovan is not going to have any more radiation.  Linda Donovan verbalized agreement.

## 2014-09-27 NOTE — Telephone Encounter (Signed)
Spoke with [name], hospice nurse. Stated that pt was to start Lasix 40 mg PO daily for three days ordered by Dr. Lyman Speller yesterday.  States that pt has limited mobility, walks with quad-cane and chronic oxygen (4L) use. Pt home during the day with elderly mother while husband is at work.  Shared with nurse updated orders per Dr. Haroldine Laws; told would verify he wants to continue to IV Lasix orders since previous orders had been placed by hospice MD.   Spoke with Dr. Haroldine Laws asked that I verify pt weight as she was 130 -131 lbs at discharge from hospital last admission; noted that pt is very sensitive with weight increase.  Shared hospice MD orders given to pt yesterday. Stated that is fine if pt is having adequate output and starting to breathe better that pt can take 40 mg of Lasix PO BID for 3 days and 20 K; then 40 mg PO and 20 K q daily after.    Pt stated she has taken the 40 mg of Lasix as ordered by hospice MD has gone to the bathroom a few times but has not had much output and still having difficulty breathing. Pt stated her weight is up 6 lbs since Monday (Mon: 124; Tues: 125.6; Wed: 128.4; Thurs: 129.8; Fri: 130.8).  Pt stated she is willing to get an IV to feel better and be given the 80 mg of IV Lasix one time; along with 20 mEq of Potassium; and take 40 mg Lasix q daily with 20 K q daily after today, pt agreed.  [name] contacted and updated with plan for pt.  She stated she would take care of getting IV medication to pt this afternoon.  Received call from [place], with Hospice, verifying orders and if we were going to call in the medication or if they needed to.  Requested that they please call in medications.  She agreed and would continue.  Received call from Abigail Butts that they could not get IV in pt and pt no longer has a PICC access. Updated orders stating that pt can take 40 mg of LasixPO BID for next 3 days with 20 mEq of K and continue with 40 mg PO of Lasix and 20 K q daily.

## 2014-09-27 NOTE — Telephone Encounter (Signed)
Call from [place] as pt husband is worried about drug interactions of 20 K and her Aldactone and Losartan.  Verified that it is important for pt to take the medication as Lasix will deplete her body of this electrolyte .  She also stated that husband has not been giving pt the ordered 35 mEQ q daily previously ordered as he was worried about interactions. She agreed and stated would share with pt the importance of taking the 20 mEq of K daily with the Lasix.

## 2014-09-27 NOTE — Telephone Encounter (Signed)
8:38: Spoke with [name], hospice nurse. Stated that pt was to start 40 mg of PO Lasix today

## 2014-09-30 ENCOUNTER — Ambulatory Visit: Payer: BLUE CROSS/BLUE SHIELD

## 2014-09-30 ENCOUNTER — Telehealth: Payer: Self-pay | Admitting: Medical Oncology

## 2014-09-30 ENCOUNTER — Encounter: Payer: Self-pay | Admitting: Radiation Oncology

## 2014-09-30 NOTE — Telephone Encounter (Signed)
Linda Donovan reported pt had low EF 25-30%.

## 2014-09-30 NOTE — Progress Notes (Signed)
  Radiation Oncology         [phone]) [phone] ________________________________  Name: KINDSEY EBLIN MRN: [id number]  Date: 09/30/2014  DOB: June 14, 1955  End of Treatment Note  Diagnosis:   Recurrent Non-small cell lung cancer    Indication for treatment:  Recurrence within the right chest and breathing difficulties       Radiation treatment dates:   March 30 through April 7  Site/dose:   Right upper chest region 17.5 gray in 7 fractions  Beams/energy:   Intensity modulated radiation therapy, rapid arc, IMRT was chosen in light of her previous radiation therapy to this area  Narrative: The patient tolerated radiation treatment relatively well.   She did develop breathing difficulties towards the latter portion of her treatment. The patient was placed on antibiotics after cone beam imaging showed probable pneumonia. Patient's breathing continued to worsen she was admitted to the hospital. The patient was found to have congestive heart failure and ejection fraction issues. In light of these other issues, the patient after discussion with myself decided to discontinue her right chest radiation therapy. The patient's breathing seemed to improve after her outpatient and inpatient antibiotic therapy. Her x-rays also showed improvement in the consolidation within the right lung area.  Plan: The patient has completed radiation treatment. The patient will return to radiation oncology clinic for routine followup in one month. I advised them to call or return sooner if they have any questions or concerns related to their recovery or treatment.  The patient is being followed by hospice at home.  -----------------------------------  Blair Promise, PhD, MD

## 2014-10-01 ENCOUNTER — Ambulatory Visit: Payer: BLUE CROSS/BLUE SHIELD

## 2014-10-02 ENCOUNTER — Ambulatory Visit: Payer: BLUE CROSS/BLUE SHIELD | Admitting: Physician Assistant

## 2014-10-02 ENCOUNTER — Other Ambulatory Visit (HOSPITAL_BASED_OUTPATIENT_CLINIC_OR_DEPARTMENT_OTHER): Payer: BLUE CROSS/BLUE SHIELD

## 2014-10-02 ENCOUNTER — Other Ambulatory Visit: Payer: Self-pay | Admitting: Medical Oncology

## 2014-10-02 ENCOUNTER — Telehealth: Payer: Self-pay | Admitting: Emergency Medicine

## 2014-10-02 ENCOUNTER — Ambulatory Visit (HOSPITAL_BASED_OUTPATIENT_CLINIC_OR_DEPARTMENT_OTHER): Payer: BLUE CROSS/BLUE SHIELD | Admitting: Internal Medicine

## 2014-10-02 ENCOUNTER — Telehealth: Payer: Self-pay | Admitting: Internal Medicine

## 2014-10-02 ENCOUNTER — Other Ambulatory Visit: Payer: BLUE CROSS/BLUE SHIELD

## 2014-10-02 ENCOUNTER — Telehealth: Payer: Self-pay | Admitting: Medical Oncology

## 2014-10-02 ENCOUNTER — Encounter: Payer: Self-pay | Admitting: Internal Medicine

## 2014-10-02 VITALS — BP 117/64 | HR 114 | Temp 98.2°F | Resp 18 | Ht 63.0 in | Wt 129.2 lb

## 2014-10-02 DIAGNOSIS — C3491 Malignant neoplasm of unspecified part of right bronchus or lung: Secondary | ICD-10-CM

## 2014-10-02 DIAGNOSIS — C3490 Malignant neoplasm of unspecified part of unspecified bronchus or lung: Secondary | ICD-10-CM

## 2014-10-02 LAB — CBC WITH DIFFERENTIAL/PLATELET
BASO%: 0.2 % (ref 0.0–2.0)
BASOS ABS: 0 10*3/uL (ref 0.0–0.1)
EOS ABS: 0.1 10*3/uL (ref 0.0–0.5)
EOS%: 0.5 % (ref 0.0–7.0)
HCT: 41.1 % (ref 34.8–46.6)
HEMOGLOBIN: 13.5 g/dL (ref 11.6–15.9)
LYMPH%: 1.8 % — ABNORMAL LOW (ref 14.0–49.7)
MCH: 32.6 pg (ref 25.1–34.0)
MCHC: 32.9 g/dL (ref 31.5–36.0)
MCV: 98.9 fL (ref 79.5–101.0)
MONO#: 1 10*3/uL — ABNORMAL HIGH (ref 0.1–0.9)
MONO%: 5.2 % (ref 0.0–14.0)
NEUT#: 17.5 10*3/uL — ABNORMAL HIGH (ref 1.5–6.5)
NEUT%: 92.3 % — AB (ref 38.4–76.8)
Platelets: 327 10*3/uL (ref 145–400)
RBC: 4.15 10*6/uL (ref 3.70–5.45)
RDW: 14.8 % — AB (ref 11.2–14.5)
WBC: 18.9 10*3/uL — AB (ref 3.9–10.3)
lymph#: 0.3 10*3/uL — ABNORMAL LOW (ref 0.9–3.3)

## 2014-10-02 LAB — COMPREHENSIVE METABOLIC PANEL (CC13)
ALBUMIN: 3.4 g/dL — AB (ref 3.5–5.0)
ALK PHOS: 68 U/L (ref 40–150)
ALT: 24 U/L (ref 0–55)
AST: 17 U/L (ref 5–34)
Anion Gap: 12 mEq/L — ABNORMAL HIGH (ref 3–11)
BUN: 18.1 mg/dL (ref 7.0–26.0)
CO2: 34 mEq/L — ABNORMAL HIGH (ref 22–29)
Calcium: 9 mg/dL (ref 8.4–10.4)
Chloride: 88 mEq/L — ABNORMAL LOW (ref 98–109)
Creatinine: 0.7 mg/dL (ref 0.6–1.1)
EGFR: >90 mL/min/{1.73_m2} (ref >90)
Glucose: 127 mg/dl (ref 70–140)
Potassium: 3.5 mEq/L (ref 3.5–5.1)
Sodium: 134 mEq/L — ABNORMAL LOW (ref 136–145)
TOTAL PROTEIN: 6.4 g/dL (ref 6.4–8.3)
Total Bilirubin: 0.72 mg/dL (ref 0.20–1.20)

## 2014-10-02 NOTE — Telephone Encounter (Signed)
no voicemail....mailed pt appt sched/avs and letter

## 2014-10-02 NOTE — Telephone Encounter (Signed)
s.w. pt and advised on todays appt time change....pt ok and aware

## 2014-10-02 NOTE — Telephone Encounter (Signed)
I called pt to ask her to come in earlier today and she will call me back and let me know.

## 2014-10-02 NOTE — Telephone Encounter (Signed)
Patient is getting an oxygen concentrator from [facility].  They called requesting information to complete her order.  Gave them the O2 information.  Nothing further needed.

## 2014-10-02 NOTE — Progress Notes (Signed)
[facility] Telephone:([phone]) [phone]   Fax:([phone]) [phone]  OFFICE PROGRESS NOTE   DIAGNOSIS: Recurrent non-small cell lung cancer initially diagnosed as Stage IIIA non-small cell lung cancer, adenocarcinoma with negative EGFR mutation and negative ALK gene translocation diagnosed in March of 2014   PRIOR THERAPY:  1) Concurrent chemoradiation with weekly carboplatin for AUC of 2 and paclitaxel 45 mg/M2, status post 8 cycles, last dose was given on 11/13/2012 with partial response.  2) Consolidation chemotherapy with carboplatin for AUC of 5 on day 1 and gemcitabine 1000 mg/M2 on days 1 and 8 every 3 weeks, status post 3 cycles, last dose was given 02/20/2013 with mild improvement in her disease . First cycle was given on 01/02/2013.  3) status post subxiphoid pericardial window under the care of Dr. Dorris Fetch on 01/04/2014 and the final pathology showed no evidence for malignancy. 4) Systemic chemotherapy with carboplatin for AUC of 5 and Alimta 500 MG/M2 every 3 weeks. First dose 08/20/2014 discontinued secondary to intolerance   CURRENT THERAPY: Palliative and hospice care.  CHEMOTHERAPY INTENT: Control  CURRENT # OF CHEMOTHERAPY CYCLES: 0  CURRENT ANTIEMETICS: Zofran, dexamethasone and Compazine  CURRENT SMOKING STATUS: Former smoker, quit in 1990  ORAL CHEMOTHERAPY AND CONSENT: None  CURRENT BISPHOSPHONATES USE: None  PAIN MANAGEMENT: 8/10, currently Percocet 5/325, 1-2 tabs q 4 hours  NARCOTICS INDUCED CONSTIPATION: None  LIVING WILL AND CODE STATUS: Full code  INTERVAL HISTORY: Linda Donovan 59 y.o. female returns to the clinic today for followup visit accompanied by her husband and mother. The patient was recently admitted to [facility] with pancytopenia after the first cycle of her treatment. She also had COPD exacerbation and questionable pneumonia. During her admission she was found to have elevated troponin and the patient was diagnosed with  acute systolic congestive heart failure with left ventricular dysfunction. She was seen by Dr. Gala Romney. Because of the significant decline in her condition The patient was referred to palliative care and hospice after discharge. She is happy with the hospice service and the care they provide. She came today for reevaluation and discussion of her options. She continues to have shortness of breath and currently on home oxygen. The patient also has dry cough. She denied having any significant chest pain or hemoptysis. She has no significant weight loss or night sweats. She has no fever or chills. She has no nausea or vomiting.  MEDICAL HISTORY: Past Medical History  Diagnosis Date  . COPD (chronic obstructive pulmonary disease)   . ADD (attention deficit disorder)   . Hx of radiation therapy 09/28/12- 11/17/12    RU lung mass, mediastinum chest, 63 gray 35 fx  . Pneumonia   . Arthritis   . Non-small cell lung cancer 09/18/2012    RUL  . Asthma     ALLERGIES:  is allergic to clinoril and morphine.  MEDICATIONS:  Current Outpatient Prescriptions  Medication Sig Dispense Refill  . ALPRAZolam (XANAX) 1 MG tablet Take 1 tablet (1 mg total) by mouth 3 (three) times daily. 90 tablet 0  . aspirin 81 MG tablet Take 81 mg by mouth daily.    Marland Kitchen azithromycin (ZITHROMAX) 500 MG tablet Take 1 tablet (500 mg total) by mouth daily. 5 tablet 0  . budesonide-formoterol (SYMBICORT) 160-4.5 MCG/ACT inhaler Inhale 2 puffs into the lungs 2 (two) times daily. 1 Inhaler 0  . calcium-vitamin D (OSCAL WITH D) 250-125 MG-UNIT per tablet Take 1 tablet by mouth daily.    Marland Kitchen  digoxin (LANOXIN) 0.125 MG tablet Take 1 tablet (0.125 mg total) by mouth daily. 30 tablet 0  . docusate sodium (COLACE) 100 MG capsule Take 1 capsule (100 mg total) by mouth 2 (two) times daily. 10 capsule 0  . folic acid (FOLVITE) 1 MG tablet Take 1 tablet (1 mg total) by mouth daily. 30 tablet 4  . HYDROcodone-homatropine (HYCODAN) 5-1.5 MG/5ML  syrup Take 5 mLs by mouth every 6 (six) hours as needed for cough. 240 mL 0  . ipratropium (ATROVENT) 0.02 % nebulizer solution Take 2.5 mLs (0.5 mg total) by nebulization 4 (four) times daily. 75 mL 12  . levalbuterol (XOPENEX) 0.63 MG/3ML nebulizer solution Take 3 mLs (0.63 mg total) by nebulization 4 (four) times daily. 3 mL 12  . losartan (COZAAR) 25 MG tablet Take 0.5 tablets (12.5 mg total) by mouth daily. 30 tablet 0  . magnesium oxide (MAG-OX) 400 MG tablet Take 1 tablet (400 mg total) by mouth daily. 60 tablet 2  . methocarbamol (ROBAXIN) 500 MG tablet Take 1 tablet (500 mg total) by mouth every 6 (six) hours as needed for muscle spasms. 90 tablet 0  . Multiple Vitamins-Minerals (MULTIVITAMIN WITH MINERALS) tablet Take 1 tablet by mouth daily.    Marland Kitchen oxyCODONE-acetaminophen (PERCOCET/ROXICET) 5-325 MG per tablet Take 1 tablet by mouth every 4 (four) hours as needed for severe pain. 30 tablet 0  . OXYGEN Inhale 2 L into the lungs continuous.    . polyethylene glycol (MIRALAX / GLYCOLAX) packet Take 17 g by mouth daily as needed for mild constipation or moderate constipation. 14 each 0  . Potassium 75 MG TABS Take 1 tablet by mouth daily.    . predniSONE (DELTASONE) 5 MG tablet 10 tablets for 3 days 9 tablets for 3 days 8 tablets for 3 days 7 tablets for 3 days 6 tablets for 3 days 5 tablets for 3 days 4 tablets for 3 days 3 tablets for 3 days 2 tablets for 3 days 1 tablet for 3 days 200 tablet 0  . prochlorperazine (COMPAZINE) 10 MG tablet Take 1 tablet (10 mg total) by mouth every 6 (six) hours as needed for nausea or vomiting. 30 tablet 0  . spironolactone (ALDACTONE) 25 MG tablet Take 0.5 tablets (12.5 mg total) by mouth daily. 30 tablet 0  . sucralfate (CARAFATE) 1 G tablet Take 1 tablet (1 g total) by mouth 4 (four) times daily -  with meals and at bedtime. Dissolve in 10 ml of water prior to taking 120 tablet 0  . traZODone (DESYREL) 50 MG tablet Take 50 mg by mouth at bedtime.      Marland Kitchen zinc gluconate 50 MG tablet Take 50 mg by mouth daily.     No current facility-administered medications for this visit.    SURGICAL HISTORY:  Past Surgical History  Procedure Laterality Date  . Total abdominal hysterectomy    . Shoulder arthroscopy    . Video bronchoscopy Bilateral 07/25/2012    Procedure: VIDEO BRONCHOSCOPY WITH FLUORO;  Surgeon: Tanda Rockers, MD;  Location: [facility];  Service: Endoscopy;  Laterality: Bilateral;  . Lump removed from breasr right  2003  . Endobronchial ultrasound Bilateral 09/04/2012    Procedure: ENDOBRONCHIAL ULTRASOUND;  Surgeon: Collene Gobble, MD;  Location: [facility];  Service: Cardiopulmonary;  Laterality: Bilateral;  . Breast surgery    . Subxyphoid pericardial window N/A 01/04/2014    Procedure: SUBXYPHOID PERICARDIAL WINDOW;  Surgeon: [name], MD;  Location: [place];  Service:  Thoracic;  Laterality: N/A;  . Intraoperative transesophageal echocardiogram N/A 01/04/2014    Procedure: INTRAOPERATIVE TRANSESOPHAGEAL ECHOCARDIOGRAM;  Surgeon: [name], MD;  Location: [place];  Service: Open Heart Surgery;  Laterality: N/A;    REVIEW OF SYSTEMS:  Constitutional: positive for fatigue Eyes: negative Ears, nose, mouth, throat, and face: negative Respiratory: positive for cough, dyspnea on exertion and wheezing Cardiovascular: negative Gastrointestinal: negative Genitourinary:negative Integument/breast: negative Hematologic/lymphatic: negative Musculoskeletal:negative Neurological: negative Behavioral/Psych: negative Endocrine: negative Allergic/Immunologic: negative   PHYSICAL EXAMINATION: General appearance: alert, cooperative, fatigued and no distress Head: Normocephalic, without obvious abnormality, atraumatic Neck: no adenopathy, no JVD, supple, symmetrical, trachea midline and thyroid not enlarged, symmetric, no tenderness/mass/nodules Lymph nodes: Cervical, supraclavicular, and axillary nodes normal. Resp:  clear to auscultation bilaterally and normal percussion bilaterally Back: symmetric, no curvature. ROM normal. No CVA tenderness. Cardio: regular rate and rhythm, S1, S2 normal, no murmur, click, rub or gallop and normal apical impulse GI: soft, non-tender; bowel sounds normal; no masses,  no organomegaly Extremities: extremities normal, atraumatic, no cyanosis or edema Neurologic: Alert and oriented X 3, normal strength and tone. Normal symmetric reflexes. Normal coordination and gait  ECOG PERFORMANCE STATUS: 2 - Symptomatic, <50% confined to bed  Blood pressure 117/64, pulse 114, temperature 98.2 F (36.8 C), temperature source Oral, resp. rate 18, height $RemoveBe'5\' 3"'NRLuCSKTN$  (1.6 m), weight 129 lb 3.2 oz (58.605 kg), SpO2 100 %.  LABORATORY DATA: Lab Results  Component Value Date   WBC 18.9* 10/02/2014   HGB 13.5 10/02/2014   HCT 41.1 10/02/2014   MCV 98.9 10/02/2014   PLT 327 10/02/2014      Chemistry      Component Value Date/Time   NA 139 09/19/2014 0500   NA 138 09/12/2014 1444   K 3.3* 09/19/2014 0500   K 4.0 09/12/2014 1444   CL 89* 09/19/2014 0500   CL 104 11/13/2012 1122   CO2 46* 09/19/2014 0500   CO2 26 09/12/2014 1444   BUN 14 09/19/2014 0500   BUN 6.6* 09/12/2014 1444   CREATININE 0.44* 09/19/2014 0500   CREATININE 0.6 09/12/2014 1444      Component Value Date/Time   CALCIUM 8.3* 09/19/2014 0500   CALCIUM 9.3 09/12/2014 1444   ALKPHOS 110 09/12/2014 1444   ALKPHOS 83 01/06/2014 0311   AST 23 09/12/2014 1444   AST 20 01/06/2014 0311   ALT 21 09/12/2014 1444   ALT 15 01/06/2014 0311   BILITOT 0.29 09/12/2014 1444   BILITOT 0.3 01/06/2014 0311       RADIOGRAPHIC STUDIES: Ct Angio Chest Pe W/cm &/or Wo Cm  09/12/2014   CLINICAL DATA:  History of recurrent lung cancer with increasing shortness of breath. Patient is currently being treated for potential pneumonia.  EXAM: CT ANGIOGRAPHY CHEST WITH CONTRAST  TECHNIQUE: Multidetector CT imaging of the chest was performed  using the standard protocol during bolus administration of intravenous contrast. Multiplanar CT image reconstructions and MIPs were obtained to evaluate the vascular anatomy.  CONTRAST:  49mL OMNIPAQUE IOHEXOL 350 MG/ML SOLN  COMPARISON:  July 17, 2014  FINDINGS: There is no pulmonary embolus. There is atherosclerosis of the aorta without aortic dissection or aneurysmal dilatation. The heart size is normal. There is no pericardial effusion. Mediastinal and subcarinal lymph nodes are unchanged.  Ill-defined masslike opacity is seen in the posterior right upper lung field with involvement of the right hilum unchanged compared to prior exam. There is interval developed patchy airspace opacity involving the right upper lobe, differential diagnosis  includes interval developed pneumonia or radiation pneumonitis if there has been interval radiation since February 2016. There is interval increase of small to moderate right pleural effusion. The left lung is unchanged with emphysematous changes.  The visualized upper abdominal structures are unremarkable. There is a small hiatal hernia. No focal lytic or blastic lesion is identified within the visualized bones.  Review of the MIP images confirms the above findings.  IMPRESSION: Ill-defined masslike opacity identified in the right upper lung field with involvement of the right hilum consistent with patient's known recurrent lung cancer without changed compared to prior CT.  Interval developed patchy airspace opacity involving the right upper lobe, differential diagnosis includes interval foot bowel pneumonia or radiation pneumonitis if there has been interval radiation since February 2016.  Interval increase small to moderate right pleural effusion.   Electronically Signed   By: Abelardo Diesel M.D.   On: 09/12/2014 20:16   Dg Chest Portable 1 View  09/18/2014   CLINICAL DATA:  Shortness of breath, COPD.  Radiation pneumonitis.  EXAM: PORTABLE CHEST - 1 VIEW  COMPARISON:   09/16/2014  FINDINGS: Further improvement in right mid and lower lung airspace opacities. Continued dense opacification of the right upper lobe. Scarring noted in the lingula, stable. Underlying COPD. Heart is normal size. No acute bony abnormality.  IMPRESSION: Further improvement in right mid and lower lung airspace opacities, likely improving pneumonia/pneumonitis. Stable dense right apical opacity.  COPD.   Electronically Signed   By: Rolm Baptise M.D.   On: 09/18/2014 12:19   Dg Chest Port 1 View  09/16/2014   CLINICAL DATA:  59 year old female with a history of pneumonia and shortness of breath. Known lung carcinoma.  EXAM: PORTABLE CHEST - 1 VIEW  COMPARISON:  CT chest 09/12/2014, chest x-ray 09/12/2014, PET-CT 08/12/2014, chest x-ray 08/01/2014  FINDINGS: Cardiac size unchanged.  Contour of the mediastinum is similar, with obscuration of the right mediastinum secondary to right upper lobe/hilar consolidation/mass, characterized previously on the CT and PET-CT.  Elevation of the minor fissure.  Improved appearance of airspace opacities and interlobular septal thickening of the right lower lobe compared to the prior chest x-ray.  Right pleural parenchymal opacity at the apex persists as well as airspace disease of the right upper lobe.  No left-sided consolidative airspace disease. Similar appearance of the interstitial opacities at the base.  Persistent blunting of the right costophrenic angle. No large with left-sided pleural effusion.  No displaced fracture.  IMPRESSION: Improved airspace and interstitial opacities of the right lower lung, suggesting improved pneumonia/ pneumonitis.  Persistent right hilar and right upper lobe consolidative changes compatible with the patient's known invasive carcinoma. These changes were better characterized on recent CT chest.  Trace right-sided pleural effusion.  Signed,  Dulcy Fanny. Earleen Newport, DO  [facility]  [facility]    Electronically Signed   By: Corrie Mckusick D.O.   On: 09/16/2014 14:05   Dg Chest Port 1 View  09/12/2014   CLINICAL DATA:  Short of breath for 2 days. Chest congestion and cough for 1 month. Lung cancer with recurrence. COPD and asthma.  EXAM: PORTABLE CHEST - 1 VIEW  COMPARISON:  08/01/2014 and PET of 08/12/2014.  FINDINGS: Numerous leads and wires project over the chest. Tracheal deviation to the right with mediastinal shift due to volume loss within the right hemithorax. Mild cardiomegaly. Trace right pleural fluid or thickening, blunting the right costophrenic angle. No pneumothorax. Right apical pleural thickening. Right upper lobe  masslike opacity is similar to slightly decreased. There is new right mid and lower lung interstitial and airspace disease, especially laterally. Clear left lung with interstitial thickening, lower lobe predominant.  IMPRESSION: Slight improvement in right upper lobe/ apical aeration. This was consistent with recurrent tumor on the 08/12/2014 PET. Development of interstitial and airspace disease in the right mid and lower lung. Considerations include radiation pneumonitis (if there is been interval radiation since 08/01/2014) or infection. Lymphangitic tumor spread is felt less likely, given time course.   Electronically Signed   By: Abigail Miyamoto M.D.   On: 09/12/2014 19:00   Mr Card Morphology W/o Cm  09/16/2014   CLINICAL DATA:  59 year old female  EXAM: CARDIAC MRI  TECHNIQUE: The patient was scanned on a 1.5 Tesla GE magnet. A dedicated cardiac coil was used. Functional imaging was done using Fiesta sequences. 2,3, and 4 chamber views were done to assess for RWMA's. Modified Simpson's rule using a short axis stack was used to calculate an ejection fraction on a dedicated work Conservation officer, nature. The patient received no contrast. After 10 minutes inversion recovery sequences were used to assess for infiltration and scar tissue.  CONTRAST:  None.  FINDINGS: This  study is of limited quality secondary to tachycardia, tachypnea and patient's inability to hold breath.  1. Normal left ventricular size, thickness and severely decreased systolic function (LVEF = 26%). Systolic function might be underestimated secondary to significant tachycardia during acquisition.  There is diffuse hypokinesis.  LVEDV:  152 ml  LVESV:  112 ml  SV:  40 ml  CO:  3.6 L/minute  2. Normal ventricular size, thickness and mildly to moderately reduced systolic function (RVEF = 34%).  3.  Normal biatrial size.  4.  Mild mitral and mild to moderate tricuspid regurgitation.  5.  Normal size of the aortic root and ascending aorta.  6. Moderately dilated pulmonary artery (38 mm) suggestive of significant pulmonary hypertension.  IMPRESSION: 1. Normal left ventricular size, thickness and severely decreased systolic function (LVEF = 26%). Systolic function might be underestimated secondary to significant tachycardia during acquisition.  There is diffuse hypokinesis.  2. Normal ventricular size, thickness and mildly to moderately reduced systolic function (RVEF = 34%).  3.  Normal biatrial size.  4.  Mild mitral and mild to moderate tricuspid regurgitation.  5.  Normal size of the aortic root and ascending aorta.  6. Moderately dilated pulmonary artery (38 mm) suggestive of significant pulmonary hypertension.  Ena Dawley   Electronically Signed   By: Ena Dawley   On: 09/16/2014 15:37    ASSESSMENT AND PLAN: This is a very pleasant 59 years old white female with stage IIIA non-small cell lung cancer, adenocarcinoma with negative EGFR mutation and negative ALK gene translocation is status post concurrent chemoradiation with weekly carboplatin and paclitaxel followed by consolidation chemotherapy with 3 cycles of carboplatin and gemcitabine. The patient was found to have evidence for disease progression and was started on systemic chemotherapy with one cycle of carboplatin and Alimta. This was  complicated with pancytopenia. The patient was also recently treated for COPD exacerbation as well as congestive heart failure. Because of the comorbidities and deterioration of her condition she was referred to the palliative care and hospice. She is currently happy with the service. I discussed with the patient several options for management of her condition including continuation with the palliative care and hospice versus consideration of treatment with immunotherapy after she taper her prednisone treatment. She  will need to discuss her other comorbidities with her cardiologist and pulmonologist before making a decision regarding resuming systemic therapy. The patient would like some time to think about her options. I will arrange for her to come back for follow-up visit in 2 months for reevaluation with repeat CT scan of the chest for restaging of her disease. If her final decision is to continue with hospice, we will discontinue the scan order and follow-up visit. The patient was advised to call immediately if she has any concerning symptoms in the interval.  The patient voices understanding of current disease status and treatment options and is in agreement with the current care plan.  All questions were answered. The patient knows to call the clinic with any problems, questions or concerns. We can certainly see the patient much sooner if necessary.  Disclaimer: This note was dictated with voice recognition software. Similar sounding words can inadvertently be transcribed and may not be corrected upon review.

## 2014-10-04 ENCOUNTER — Ambulatory Visit: Payer: BLUE CROSS/BLUE SHIELD

## 2014-10-04 ENCOUNTER — Ambulatory Visit (INDEPENDENT_AMBULATORY_CARE_PROVIDER_SITE_OTHER): Payer: BLUE CROSS/BLUE SHIELD | Admitting: Adult Health

## 2014-10-04 ENCOUNTER — Other Ambulatory Visit: Payer: BLUE CROSS/BLUE SHIELD

## 2014-10-04 ENCOUNTER — Encounter: Payer: Self-pay | Admitting: Adult Health

## 2014-10-04 ENCOUNTER — Telehealth: Payer: Self-pay | Admitting: Adult Health

## 2014-10-04 VITALS — BP 134/76 | HR 113 | Temp 98.2°F | Ht 63.0 in | Wt 128.9 lb

## 2014-10-04 DIAGNOSIS — J441 Chronic obstructive pulmonary disease with (acute) exacerbation: Secondary | ICD-10-CM | POA: Diagnosis not present

## 2014-10-04 DIAGNOSIS — C3490 Malignant neoplasm of unspecified part of unspecified bronchus or lung: Secondary | ICD-10-CM

## 2014-10-04 DIAGNOSIS — J181 Lobar pneumonia, unspecified organism: Secondary | ICD-10-CM

## 2014-10-04 NOTE — Assessment & Plan Note (Signed)
Taper prednisone as directed to '10mg'$  daily -hold at this dose until seen back in office  Continue on current regimen.  Follow up with Dr. Lamonte Sakai  In 3 weeks as planned and As needed   Please contact office for sooner follow up if symptoms do not improve or worsen or seek emergency care

## 2014-10-04 NOTE — Assessment & Plan Note (Signed)
Now under hospice care  Has follow up scheduled with Dr. Inda Merlin .

## 2014-10-04 NOTE — Progress Notes (Signed)
Subjective:    Patient ID: Linda Donovan, female    DOB: Jan 17, 1956, 59 y.o.   MRN: [id number]  HPI Primary Linda Donovan  62 yowf quit smoking 1998 with sob that improved to point were stopped all medications x rare  saba  self referred to pulmonary clinic for hemoptysis.  1st pulmonary eval in EMR era/ [facility]  Sob with inclines and cold weather and steps and no increase in saba indolent onset rattling cough and throat congestion onset mid Jan 2014 with white phlegm streaked with blood and sev times a week esp in am's and no epistaxis. No more blood x 5 days, ? Back of throat.   No obvious pattern to  variabilty or assoc sorethroat cp or chest tightness, subjective wheeze overt sinus or hb symptoms. No unusual exp hx or h/o childhood pna/ asthma or premature birth to his knowledge.   ROV 08/25/12 -- 59 yo former smoker, seen by Dr Melvyn Novas for hemoptysis. 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 S/p FOB that was non-diagnostic. She is referred after PET scan to consider another bx. Her hemoptysis has resolved.  She is c/o nasal gtt.   ROV 09/25/12 -- 59 yo former smoker w new dx adenoCA by FOB beginning of April. She is undergoing rx with Dr Julien Nordmann, concurrent chemo + XRT. She is very concerned that her FOB's aren't covered by BCBS. No more hemoptysis.  She has exertional dyspnea.    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 Also stage III lung cancer on chemotherapy  On sept,  23rd 2014 she had second cycle chemotherapy. On the following did she develop shortness of breath and was up and given some prednisone that she completed a few days ago but since then she's had worsening shortness of breath along with increased cough and some change in sputum to green color. She was addressed with emergency room but she refused. So I am seeing acutely. On arrival she was 60% on room air at rest but corrected with 2 L of oxygen. Despite  her severe COPD status prior hypoxemia has not been documented although she insists otherwise and says that hypoxemia stable and unchanged and baseline. He denies any fever, hemoptysis, orthopnea, paroxysmal nocturnal dyspnea edema. Symptoms are associated with severe fatigue  Strongly recommended admission but she flatly refused and accpeted liability for any roblems that might arise. 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   Follow up 03/15/13 --  Patient returns for a 60-week followup. Patient was seen last week with a COPD exacerbation. She was treated with Levaquin and a prednisone taper. Chest x-ray shows COPD changes, and decreased right upper lobe tumor size. Patient has a known history of lung cancer is followed by Dr. Inda Merlin. She finished her last chemotherapy treatment on September 23. She's completed her radiation therapy. Cycle. CT chest on October 6, showed mild improvement in right upper lobe lung mass and subcarinal adenopathy with no evidence for disease progression. She did have a new small to moderate size pleural effusion.  Patient returns today reporting that she is feeling much improved. She has decreased shortness, of breath and cough. Patient denies any hemoptysis, orthopnea, PND, leg swelling, nausea, vomiting, choking, dysphagia. Patient had been recommended to start oxygen therapy. Due to exertional desaturations. At last visit.  However oxygen order was not completed. Today in the office. Patient's O2 saturations are 90-92% on room air at rest. However, walking, O2 saturation is 85% on room air. Patient has agreed to start on oxygen therapy at home.   ROV 60/3/14 -- COPD, NSCLCA dx RUL s/p chemo. Treated for an AE 1 month ago. Last visit she was improving, Spiriva was substituted for symbicort. She was treated again last week with doxy. Some throat irritation with  spiriva, but tolerating. Some improving cough, little mucous. Scheduled to see Dr Julien Nordmann w CT scan in January.   ROV 05/16/13 -- COPD, NSCLCA dx RUL s/p chemo. Last time we added back symbicort to her spiriva. She is now taking both and feels well. Still not 100% sure that she is better with the combination or just due to symbicort. No flares, no new issues.      09/04/13  Acute OV  Complains of  prod cough with increased SOB, wheezing, prod cough with clear-to-yellow mucus, chest tightness x5-6weeks.  Was treated with zpak and pred taper -got better for little while. Cough and wheezing is worse over last week.  Denies f/c/s, hemoptysis, nausea, vomiting Cough is keeping her up at night.  Has CT chest later this week .  >>Levaquin and steroids   08/01/14  Acute OV :  COPD, NSCLCA dx RUL s/p chemo Patient presents for an acute office visit. She complains of 2 weeks of productive cough with thick mucus, wheezing, shortness of breath and general malaise. She denies any hemoptysis, orthopnea, PND or leg swelling Appetite is fair with no nausea, vomiting or diarrhea. She has been using Mucinex. She remains on Symbicort.  Patient has a known history of non-small cell lung cancer with previous chemotherapy. Unfortunately, recent CT chest. This month showed increased mass in the right upper lung and involvement of the right hilum along with increased lymphadenopathy. She has an upcoming PET scan and MRI for staging and consideration of restarting chemotherapy. She is followed by Dr. Earlie Server. >>  10/04/2014 Post hospital follow up  Patient presents for a post hospital follow-up. He was admitted April 7 through April 16 for acute on chronic hypoxic respiratory failure with a right upper still lobe consolidation with known recurrent lung cancer. Hospital stay was complicated by acute congestive heart failure. 2-D echo showed an EF at 25-30% with severe pulmonary hypertension.  She was treated with IV  antibiotics, steroids, and a slow prednisone taper. At discharge. Prior to discharge. Pallative care consulted and patient is on hospice. Since discharge. Patient is feeling improved with decreased shortness of breath. She is currently on prednisone tapering.  She denies any hemoptysis, orthopnea, PND, or increased leg swelling  Patient is going to [place] with her family and needs paperwork filled out for her oxygen.   Review of Systems .Constitutional:   No  weight loss, night sweats,  Fevers, chills, + fatigue, or  lassitude.  HEENT:   No headaches,  Difficulty swallowing,  Tooth/dental problems, or  Sore throat,                No sneezing, itching, ear ache, nasal congestion, post nasal drip,   CV:  No chest pain,  Orthopnea, PND, swelling in lower extremities, anasarca, dizziness, palpitations, syncope.   GI  No heartburn, indigestion, abdominal pain, nausea, vomiting, diarrhea, change in bowel habits, loss of appetite, bloody stools.   Resp:    No chest wall deformity  Skin: no rash or lesions.  GU: no  dysuria, change in color of urine, no urgency or frequency.  No flank pain, no hematuria   MS:  No joint pain or swelling.  No decreased range of motion.  No back pain.  Psych:  No change in mood or affect. No depression or anxiety.  No memory loss.         Objective:   Physical Exam GEN: A/Ox3; chronically ill appearing on O2   HEENT:  [place]/AT,  EACs-clear, TMs-wnl, NOSE-clear, THROAT-clear, no lesions, no postnasal drip or exudate noted.   NECK:  Supple w/ fair ROM; no JVD; normal carotid impulses w/o bruits; no thyromegaly or nodules palpated; no lymphadenopathy.  RESP  Decreased BS in bases -no wheezing , no accessory muscle use, no dullness to percussion  CARD:  RRR, no m/r/g  , no peripheral edema, pulses intact, no cyanosis or clubbing.  GI:   Soft & nt; nml bowel sounds; no organomegaly or masses detected.  Musco: Warm bil, no deformities or joint swelling  noted.   Neuro: alert, no focal deficits noted.    Skin: Warm, no lesions or rashes   09/18/14 cXR   Further improvement in right mid and lower lung airspace opacities, likely improving pneumonia/pneumonitis. Stable dense right apical opacity.  COPD.      Assessment & Plan:

## 2014-10-04 NOTE — Patient Instructions (Signed)
Taper prednisone as directed to '10mg'$  daily -hold at this dose until seen back in office  Continue on current regimen.  Follow up with Dr. Lamonte Sakai  In 3 weeks as planned and As needed   Please contact office for sooner follow up if symptoms do not improve or worsen or seek emergency care

## 2014-10-04 NOTE — Telephone Encounter (Signed)
Pt in to see TP today for follow  Pt stated that Hospice was to contact the office regarding her prednisone, but is unsure of the specifics  Called pt's Hospice RN [name] who is asking if maintenance prednisone would be appropriate for pt at this time and would like a VO for this before sending rx   Per today's AVS: Patient Instructions       Taper prednisone as directed to '10mg'$  daily -hold at this dose until seen back in office   Continue on current regimen.   Follow up with Dr. Lamonte Sakai  In 3 weeks as planned and As needed    Please contact office for sooner follow up if symptoms do not improve or worsen or seek emergency care    Called spoke with [name] again and gave her VO for the change in the prednisone and informed her that pt is already scheduled for ov w/ RB on 5.20.16 [name] voiced her understanding and denied any questions/concerns at this time Will sign off

## 2014-10-04 NOTE — Assessment & Plan Note (Signed)
S/p abx  Repeat cxr on return

## 2014-10-08 ENCOUNTER — Telehealth: Payer: Self-pay | Admitting: Emergency Medicine

## 2014-10-08 ENCOUNTER — Telehealth: Payer: Self-pay

## 2014-10-08 MED ORDER — BUDESONIDE-FORMOTEROL FUMARATE 160-4.5 MCG/ACT IN AERO: 2.0000 | INHALATION_SPRAY | Freq: Two times a day (BID) | RESPIRATORY_TRACT | Status: DC

## 2014-10-08 MED ORDER — LEVALBUTEROL TARTRATE 45 MCG/ACT IN AERO: 2.0000 | INHALATION_SPRAY | RESPIRATORY_TRACT | Status: DC | PRN

## 2014-10-08 NOTE — Telephone Encounter (Signed)
xopenex hfa and symbicort both sent in.  [name] with hospice aware, will relay to pt.  Nothing further needed.

## 2014-10-08 NOTE — Telephone Encounter (Signed)
Pt's Ventolin HFA was stopped at her last hospital stay d/t tachycardia Per TP: can try sending Xopenex HFA 2 puffs every 4 hours as needed for wheezing/shortness of breath (She is already taking Xopenex nebs QID so hopefully insurance will pay for the [facility] as well).

## 2014-10-08 NOTE — Telephone Encounter (Signed)
Received call from [facility] nurse stated that pt had 2 syncopal episodes on Sunday and hit left temporal and now has a black-eye and swelling on cheek. Pt has c/o of blurred vision and when puts hands over her head she feels shaky all over. Pt weight is down 128.4 lbs and pt breathing has improved. Today BP on assessment was 1104/64.  Pt is aware that she is not to move around alone, especially at night. [name] stated pt was started on Amitriptyline 50 mg daily on Thursday last week but does not think that problem as it usually take about 7-10 days to effect.  Pt also stated she would like to be seen at some point by Dr. Haroldine Laws, preferably after she returns fromn her daughters graduation in [place].

## 2014-10-08 NOTE — Telephone Encounter (Signed)
Spoke with [name] with Hospice.  She states that pt has had 2 fainting episodes but she has a call into pt's cardiologist regarding this.  Also when pt was in to see TP last week , she told pt that she needs to keep a Albuterol rescue inhaler with her at all times.  Please advise if ok to send rx for this .  If ok cal [name] first so she can get it approved through Hospice.  Also she is faxing a copy of pt's current med list so we can update ours with their list.

## 2014-10-11 ENCOUNTER — Other Ambulatory Visit: Payer: BLUE CROSS/BLUE SHIELD

## 2014-10-17 ENCOUNTER — Telehealth: Payer: Self-pay

## 2014-10-17 NOTE — Telephone Encounter (Signed)
Received call today from [place], Hospice Nurse and pt going to [place] this weekend and will return on 5/16.  Pt would like to have an appointment in [facility] by Dr. Haroldine Laws sometime after her return.  According to [facility], pts weight is up 4 lbs today from Monday and Hospice Md has adjusted her Lasix accordingly.  She is to take 1 additional tablet today and tomorrow and then return to original dosing.  Pt understands she can take an additional 1 tablet as needed while away if experieinces SOB.

## 2014-10-17 NOTE — Telephone Encounter (Signed)
New message       Hospice calling to speak with Linda Donovan about pt. Please call back and advise.

## 2014-10-18 ENCOUNTER — Other Ambulatory Visit: Payer: BLUE CROSS/BLUE SHIELD

## 2014-10-22 ENCOUNTER — Telehealth: Payer: Self-pay | Admitting: Oncology

## 2014-10-22 NOTE — Telephone Encounter (Signed)
Keyonna left a message asking what cough medicine that Dr. Sondra Come had prescribed for her in the past.  Called her back and spoke to her hospice nurse, [name], RN.  Advised her that Dr. Sondra Come had prescribed Tussionex in the past.  [name] verbalized agreement and said they had decided to use hycodan instead.

## 2014-10-25 ENCOUNTER — Ambulatory Visit (INDEPENDENT_AMBULATORY_CARE_PROVIDER_SITE_OTHER): Payer: BLUE CROSS/BLUE SHIELD | Admitting: Emergency Medicine

## 2014-10-25 ENCOUNTER — Ambulatory Visit: Payer: BLUE CROSS/BLUE SHIELD

## 2014-10-25 ENCOUNTER — Telehealth: Payer: Self-pay | Admitting: Emergency Medicine

## 2014-10-25 ENCOUNTER — Other Ambulatory Visit: Payer: BLUE CROSS/BLUE SHIELD

## 2014-10-25 ENCOUNTER — Encounter: Payer: Self-pay | Admitting: Emergency Medicine

## 2014-10-25 ENCOUNTER — Telehealth: Payer: Self-pay | Admitting: *Deleted

## 2014-10-25 VITALS — BP 112/84 | HR 98 | Ht 63.0 in | Wt 133.0 lb

## 2014-10-25 DIAGNOSIS — J441 Chronic obstructive pulmonary disease with (acute) exacerbation: Secondary | ICD-10-CM | POA: Diagnosis not present

## 2014-10-25 DIAGNOSIS — C3490 Malignant neoplasm of unspecified part of unspecified bronchus or lung: Secondary | ICD-10-CM | POA: Diagnosis not present

## 2014-10-25 DIAGNOSIS — Z515 Encounter for palliative care: Secondary | ICD-10-CM | POA: Diagnosis not present

## 2014-10-25 NOTE — Assessment & Plan Note (Signed)
Right upper lobe recurrence of her non-small cell lung cancer. She is discussing initiating immunotherapy with Dr. Julien Nordmann.

## 2014-10-25 NOTE — Assessment & Plan Note (Signed)
Agree with palliative care consult and hospice

## 2014-10-25 NOTE — Telephone Encounter (Signed)
UNABLE TO FIND THE NAME OF THE IMMUNOTHERAPY MEDICATION IN DR.[name]'S 10/02/14 DICTATION. DR.[name] IS NOT IN THE OFFICE. INSTRUCTED PT. TO CALL AGAIN ON Monday. SHE VOICES UNDERSTANDING.

## 2014-10-25 NOTE — Progress Notes (Signed)
Subjective:    Patient ID: Linda Donovan, female    DOB: Jan 17, 1956, 59 y.o.   MRN: [id number]  HPI Primary Jeniffer Tawni Donovan  62 yowf quit smoking 1998 with sob that improved to point were stopped all medications x rare  saba  self referred to pulmonary clinic for hemoptysis.  1st pulmonary eval in EMR era/ [facility]  Sob with inclines and cold weather and steps and no increase in saba indolent onset rattling cough and throat congestion onset mid Jan 2014 with white phlegm streaked with blood and sev times a week esp in am's and no epistaxis. No more blood x 5 days, ? Back of throat.   No obvious pattern to  variabilty or assoc sorethroat cp or chest tightness, subjective wheeze overt sinus or hb symptoms. No unusual exp hx or h/o childhood pna/ asthma or premature birth to his knowledge.   ROV 08/25/12 -- 59 yo former smoker, seen by Dr Melvyn Novas for hemoptysis. 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 S/p FOB that was non-diagnostic. She is referred after PET scan to consider another bx. Her hemoptysis has resolved.  She is c/o nasal gtt.   ROV 09/25/12 -- 59 yo former smoker w new dx adenoCA by FOB beginning of April. She is undergoing rx with Dr Julien Nordmann, concurrent chemo + XRT. She is very concerned that her FOB's aren't covered by BCBS. No more hemoptysis.  She has exertional dyspnea.    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 Also stage III lung cancer on chemotherapy  On sept,  23rd 2014 she had second cycle chemotherapy. On the following did she develop shortness of breath and was up and given some prednisone that she completed a few days ago but since then she's had worsening shortness of breath along with increased cough and some change in sputum to green color. She was addressed with emergency room but she refused. So I am seeing acutely. On arrival she was 60% on room air at rest but corrected with 2 L of oxygen. Despite  her severe COPD status prior hypoxemia has not been documented although she insists otherwise and says that hypoxemia stable and unchanged and baseline. He denies any fever, hemoptysis, orthopnea, paroxysmal nocturnal dyspnea edema. Symptoms are associated with severe fatigue  Strongly recommended admission but she flatly refused and accpeted liability for any roblems that might arise. 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   Follow up 03/15/13 --  Patient returns for a one-week followup. Patient was seen last week with a COPD exacerbation. She was treated with Levaquin and a prednisone taper. Chest x-ray shows COPD changes, and decreased right upper lobe tumor size. Patient has a known history of lung cancer is followed by Dr. Inda Donovan. She finished her last chemotherapy treatment on September 23. She's completed her radiation therapy. Cycle. CT chest on October 6, showed mild improvement in right upper lobe lung mass and subcarinal adenopathy with no evidence for disease progression. She did have a new small to moderate size pleural effusion.  Patient returns today reporting that she is feeling much improved. She has decreased shortness, of breath and cough. Patient denies any hemoptysis, orthopnea, PND, leg swelling, nausea, vomiting, choking, dysphagia. Patient had been recommended to start oxygen therapy. Due to exertional desaturations. At last visit.  However oxygen order was not completed. Today in the office. Patient's O2 saturations are 90-92% on room air at rest. However, walking, O2 saturation is 85% on room air. Patient has agreed to start on oxygen therapy at home.   ROV 04/09/13 -- COPD, NSCLCA dx RUL s/p chemo. Treated for an AE 1 month ago. Last visit she was improving, Spiriva was substituted for symbicort. She was treated again last week with doxy. Some throat irritation with  spiriva, but tolerating. Some improving cough, little mucous. Scheduled to see Dr Julien Nordmann w CT scan in January.   ROV 05/16/13 -- COPD, NSCLCA dx RUL s/p chemo. Last time we added back symbicort to her spiriva. She is now taking both and feels well. Still not 100% sure that she is better with the combination or just due to symbicort. No flares, no new issues.      09/04/13  Acute OV  Complains of  prod cough with increased SOB, wheezing, prod cough with clear-to-yellow mucus, chest tightness x5-6weeks.  Was treated with zpak and pred taper -got better for little while. Cough and wheezing is worse over last week.  Denies f/c/s, hemoptysis, nausea, vomiting Cough is keeping her up at night.  Has CT chest later this week .  >>Levaquin and steroids   08/01/14  Acute OV :  COPD, NSCLCA dx RUL s/p chemo Patient presents for an acute office visit. She complains of 2 weeks of productive cough with thick mucus, wheezing, shortness of breath and general malaise. She denies any hemoptysis, orthopnea, PND or leg swelling Appetite is fair with no nausea, vomiting or diarrhea. She has been using Mucinex. She remains on Symbicort.  Patient has a known history of non-small cell lung cancer with previous chemotherapy. Unfortunately, recent CT chest. This month showed increased mass in the right upper lung and involvement of the right hilum along with increased lymphadenopathy. She has an upcoming PET scan and MRI for staging and consideration of restarting chemotherapy. She is followed by Dr. Earlie Server. >>  Post hospital follow up 10/04/14 --  Patient presents for a post hospital follow-up. He was admitted April 7 through April 16 for acute on chronic hypoxic respiratory failure with a right upper still lobe consolidation with known recurrent lung cancer. Hospital stay was complicated by acute congestive heart failure. 2-D echo showed an EF at 25-30% with severe pulmonary hypertension.  She was treated with IV  antibiotics, steroids, and a slow prednisone taper. At discharge. Prior to discharge. Pallative care consulted and patient is on hospice. Since discharge. Patient is feeling improved with decreased shortness of breath. She is currently on prednisone tapering.  She denies any hemoptysis, orthopnea, PND, or increased leg swelling  Patient is going to [place] with her family and needs paperwork filled out for her oxygen.  ROV 10/25/14 -- follow-up visit for COPD and recurrent non-small cell lung cancer in the right upper lobe. She was hospitalized in April for suspected PNA, AE-COPD, progression of LV dysfxn to be followed by Dr Haroldine Laws. She is on 4L/min since the hospitalization. She is xopenex / atrovent qid, symbicort bid. She is not wheezing, she has frequent coughing. Didn't happen when she was in [place]. She is currently being treated for possible bronchitis w abx.    Review of Systems As per the HPI, all others negative      Objective:   Physical Exam Filed Vitals:   10/25/14 1648  BP: 112/84  Pulse: 98  Height: '5\' 3"'$  (1.6 m)  Weight: 133 lb (60.328 kg)  SpO2: 98%   Gen: Pleasant, chronically ill appearing, in no distress,  normal affect  ENT: No lesions,  mouth clear,  oropharynx clear, no postnasal drip, bruised L eye  Neck: No JVD, no TMG, no carotid bruits  Lungs: No use of accessory muscles, coarse rhonchi bilaterally  Cardiovascular: RRR, heart sounds normal, no murmur or gallops, no peripheral edema  Musculoskeletal: No deformities, no cyanosis or clubbing  Neuro: alert, non focal  Skin: Warm, no lesions or rashes       Assessment & Plan:  COPD  GOLD III She reports exertional symptoms and cough. She's currently being treated for acute bronchitis through hospice. We discussed her bronchodilators today in detail. We decided to stop her Symbicort but to continue her Xopenex and ipratropium nebulizers 4 times a day. She will let me know if there is any decline  with this change. If so then we will adjust. I'll continue her oxygen as ordered.   Non-small cell lung cancer Right upper lobe recurrence of her non-small cell lung cancer. She is discussing initiating immunotherapy with Dr. Julien Nordmann.    Palliative care encounter Agree with palliative care consult and hospice

## 2014-10-25 NOTE — Assessment & Plan Note (Signed)
She reports exertional symptoms and cough. She's currently being treated for acute bronchitis through hospice. We discussed her bronchodilators today in detail. We decided to stop her Symbicort but to continue her Xopenex and ipratropium nebulizers 4 times a day. She will let me know if there is any decline with this change. If so then we will adjust. I'll continue her oxygen as ordered.

## 2014-10-25 NOTE — Telephone Encounter (Signed)
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 I agree with changes in her antibiotics and prednisone. She is discussing possible immunotherapy for her lung cancer recurrence with Dr. Julien Nordmann. The only medication changes that are made today were to stop her Symbicort. She will continue her Xopenex and ipratropium nebulizers 4 times a day on a schedule.

## 2014-10-25 NOTE — Patient Instructions (Signed)
Please continue your Xopenex and ipratropium nebulizers 4 times a day You may stop your Symbicort for now Continue your oxygen at all times Follow with Dr Lamonte Sakai in 3 months or sooner if you have any problems.

## 2014-10-25 NOTE — Telephone Encounter (Signed)
Spoke with [name]- hospice nurse.  Wanted to update [place] before visit this afternoon with RB: Pt went to [facility] last week to see her daughter graduate.  Pt came back with chest congestion, increased fatigue, low grade fever.  Hospice doc started her on doxycycline for 10 days- pt is still taking this med.  Pt was also c/o pleural pain with her maintenance percocet not helping the pain.  Her prednisone was doubled and percocet was changed to oxycodone immediate release '10mg'$ .  When [name] checked on her the next day the pt had gained 4 lbs overnight and desated to 72% on 4lpm after ambulating 15 feet.  [name] increased pt's lasix X3 days to combat weight gain.  States she has "stridor everywhere in lungs".  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  Hospice attending doc is Dr. Earlie Server who is not ok with her restarting this.  [name] is concerned that pt does not understand the severity of her illness.   [name] is requesting that we contact her after pt's visit to update her on her status, especially with med changes or any labs/testing that has been done.   Forwarding to [place] and RB to make aware prior to today's visit.

## 2014-10-25 NOTE — Telephone Encounter (Signed)
Spoke with Linda Donovan at [facility] to advise her of RB's recs below. Requesting ov note be sent to her at [phone]  This has been faxed.  Nothing further needed.

## 2014-10-26 ENCOUNTER — Other Ambulatory Visit (HOSPITAL_COMMUNITY): Payer: Self-pay | Admitting: Internal Medicine

## 2014-10-28 ENCOUNTER — Telehealth: Payer: Self-pay | Admitting: Medical Oncology

## 2014-10-28 NOTE — Telephone Encounter (Signed)
Late entry -10/24/14-Update.  [name] reported pt went to [place] and came back on Monday the 16th . Oxygen sat 72 % on 4 liters. She was having some stridor. She was started on Doxycycline for URI , having increased pain in rib cage and up 4 lbs. She was given extra lasix and prednisone for 3 days. Percocet changed to oxycodone 10 mg , 1 tablet q 4 prn pain.  . She is going to get a [facility] for pt . 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 Note to [place].

## 2014-11-01 ENCOUNTER — Telehealth: Payer: Self-pay | Admitting: *Deleted

## 2014-11-01 NOTE — Telephone Encounter (Signed)
"  We're going to hold off on her discharge due to the three-day holiday weekend.  We'll discharge her next week"  Will notify staff.

## 2014-11-01 NOTE — Telephone Encounter (Signed)
Abigail Butts RN with Hospice called reporting " we're discharging patient today.  She has a lot of equipment in the home she'll need prescriptions signed for her to keep.  Will fax scripts to Medical records for signature."  Informed her Dr. Julien Nordmann will return Tuesday 11-05-2014.  "She has oxygen and can't wait until Tuesday.  Someone needs to work on these today.  Being discharged because Dr. Julien Nordmann has offered immunotherapy and she does not meet six months or less guidelines for Hospice."

## 2014-11-05 ENCOUNTER — Other Ambulatory Visit: Payer: Self-pay | Admitting: Internal Medicine

## 2014-11-05 ENCOUNTER — Other Ambulatory Visit (HOSPITAL_COMMUNITY): Payer: Self-pay | Admitting: Internal Medicine

## 2014-11-05 ENCOUNTER — Telehealth: Payer: Self-pay | Admitting: Internal Medicine

## 2014-11-05 ENCOUNTER — Telehealth: Payer: Self-pay | Admitting: *Deleted

## 2014-11-05 NOTE — Telephone Encounter (Signed)
VM message from Hospice RN stating that patient was discharged from hospice services today as she will be receiving immunotherapy.

## 2014-11-05 NOTE — Telephone Encounter (Signed)
Pt's hospice nurse Brandon Melnick [phone] rtn your call, said to tell you pt was being d/c's from Hospice today and needs fu with [facility] --knows you are out today but wanted message to go to you

## 2014-11-06 ENCOUNTER — Telehealth: Payer: Self-pay | Admitting: *Deleted

## 2014-11-06 ENCOUNTER — Telehealth: Payer: Self-pay | Admitting: Emergency Medicine

## 2014-11-06 ENCOUNTER — Telehealth: Payer: Self-pay | Admitting: Medical Oncology

## 2014-11-06 NOTE — Telephone Encounter (Signed)
Called spoke with [name] who reported that pt is no longer under Hospice's services - patient's goal is to attempt immunotherapy Per [name], pt is always eligible to return to Hospice, but only when she decides to stop aggressive therapy.  This was effective 5.31.16 at midnight  Will sign and forward to RB as FYI

## 2014-11-06 NOTE — Telephone Encounter (Signed)
Ok thank you 

## 2014-11-06 NOTE — Telephone Encounter (Signed)
Patient called.  [facility] has equipment in their home that they would like to get rid of.  They know that Dr. Julien Nordmann has to order the removal.  Specifically, they would like the bedside commode, the hospital bed and tray, the bathtub chair, the nebulizer and wheelchair all discontinued from [place].

## 2014-11-06 NOTE — Telephone Encounter (Signed)
Spoke with [name], hospice RN and wanted to let me know that pt stated not aware of upcoming appointment and that pt is no longer a part of hospice care. Told her I would update the MD of the change in care.

## 2014-11-06 NOTE — Telephone Encounter (Signed)
Pt does not want the following equipment-hospital bed, gel overlay, bsc, TRANSFER TUB BENCH, LIGHTWEIGHT WHEELCHAIR, NEBULIZER, SUCTION MACHINE, OVERBED TABLE. I called [facility], Lucretia, to pick up equipment and to keep all oxygen and supplies. Order faxed.[facility] notified. Pt told to contact me for any other needs or concerns.

## 2014-11-07 ENCOUNTER — Telehealth: Payer: Self-pay | Admitting: Internal Medicine

## 2014-11-07 ENCOUNTER — Other Ambulatory Visit: Payer: Self-pay | Admitting: Medical Oncology

## 2014-11-07 NOTE — Telephone Encounter (Signed)
Lft msg for pt confirming labs/ov per 06/02 POF,mailed schedule to pt... KJ

## 2014-11-07 NOTE — Telephone Encounter (Signed)
returned call and s.w. pt to confirm that the 6.20 appt is not her at [facility] i advised her to call Dr. Missy Sabins

## 2014-11-11 ENCOUNTER — Telehealth: Payer: Self-pay | Admitting: Internal Medicine

## 2014-11-11 NOTE — Telephone Encounter (Signed)
pt called to r/s appt....she has to wait until husband is off work

## 2014-11-15 ENCOUNTER — Other Ambulatory Visit: Payer: BLUE CROSS/BLUE SHIELD

## 2014-11-15 ENCOUNTER — Ambulatory Visit: Payer: BLUE CROSS/BLUE SHIELD | Admitting: Internal Medicine

## 2014-11-18 ENCOUNTER — Telehealth: Payer: Self-pay | Admitting: Medical Oncology

## 2014-11-18 ENCOUNTER — Other Ambulatory Visit: Payer: Self-pay | Admitting: Medical Oncology

## 2014-11-18 DIAGNOSIS — C3490 Malignant neoplasm of unspecified part of unspecified bronchus or lung: Secondary | ICD-10-CM

## 2014-11-18 DIAGNOSIS — I519 Heart disease, unspecified: Secondary | ICD-10-CM

## 2014-11-18 MED ORDER — FUROSEMIDE 40 MG PO TABS: 40.0000 mg | ORAL_TABLET | Freq: Every day | ORAL | Status: DC

## 2014-11-18 NOTE — Telephone Encounter (Signed)
Pt requests refills for benzonatate, Carafate and lasix. Per her pharmacist she has refills for first two and he will fill those. Per Julien Nordmann he prescribed lasix 40 mg po daily. I called pt and told her to make sure she keeps appt wed.

## 2014-11-20 ENCOUNTER — Ambulatory Visit (HOSPITAL_BASED_OUTPATIENT_CLINIC_OR_DEPARTMENT_OTHER): Payer: BLUE CROSS/BLUE SHIELD | Admitting: Internal Medicine

## 2014-11-20 ENCOUNTER — Other Ambulatory Visit (HOSPITAL_BASED_OUTPATIENT_CLINIC_OR_DEPARTMENT_OTHER): Payer: BLUE CROSS/BLUE SHIELD

## 2014-11-20 ENCOUNTER — Telehealth: Payer: Self-pay | Admitting: Internal Medicine

## 2014-11-20 ENCOUNTER — Encounter: Payer: Self-pay | Admitting: Internal Medicine

## 2014-11-20 VITALS — BP 111/64 | HR 66 | Temp 98.3°F | Resp 19 | Ht 63.0 in | Wt 135.6 lb

## 2014-11-20 DIAGNOSIS — C3491 Malignant neoplasm of unspecified part of right bronchus or lung: Secondary | ICD-10-CM

## 2014-11-20 DIAGNOSIS — C3490 Malignant neoplasm of unspecified part of unspecified bronchus or lung: Secondary | ICD-10-CM

## 2014-11-20 LAB — CBC WITH DIFFERENTIAL/PLATELET
BASO%: 0.2 % (ref 0.0–2.0)
BASOS ABS: 0 10*3/uL (ref 0.0–0.1)
EOS%: 1 % (ref 0.0–7.0)
Eosinophils Absolute: 0.2 10*3/uL (ref 0.0–0.5)
HCT: 38.7 % (ref 34.8–46.6)
HEMOGLOBIN: 12.8 g/dL (ref 11.6–15.9)
LYMPH%: 2.7 % — AB (ref 14.0–49.7)
MCH: 33.2 pg (ref 25.1–34.0)
MCHC: 33.1 g/dL (ref 31.5–36.0)
MCV: 100.5 fL (ref 79.5–101.0)
MONO#: 1.6 10*3/uL — ABNORMAL HIGH (ref 0.1–0.9)
MONO%: 8.4 % (ref 0.0–14.0)
NEUT#: 16.5 10*3/uL — ABNORMAL HIGH (ref 1.5–6.5)
NEUT%: 87.7 % — AB (ref 38.4–76.8)
Platelets: 363 10*3/uL (ref 145–400)
RBC: 3.85 10*6/uL (ref 3.70–5.45)
RDW: 12.7 % (ref 11.2–14.5)
WBC: 18.8 10*3/uL — ABNORMAL HIGH (ref 3.9–10.3)
lymph#: 0.5 10*3/uL — ABNORMAL LOW (ref 0.9–3.3)

## 2014-11-20 LAB — COMPREHENSIVE METABOLIC PANEL (CC13)
ALBUMIN: 3.3 g/dL — AB (ref 3.5–5.0)
ALT: 19 U/L (ref 0–55)
AST: 19 U/L (ref 5–34)
Alkaline Phosphatase: 89 U/L (ref 40–150)
Anion Gap: 9 mEq/L (ref 3–11)
BILIRUBIN TOTAL: 0.44 mg/dL (ref 0.20–1.20)
BUN: 8.9 mg/dL (ref 7.0–26.0)
CALCIUM: 9.4 mg/dL (ref 8.4–10.4)
CO2: 38 mEq/L — ABNORMAL HIGH (ref 22–29)
CREATININE: 0.6 mg/dL (ref 0.6–1.1)
Chloride: 89 mEq/L — ABNORMAL LOW (ref 98–109)
EGFR: >90 mL/min/{1.73_m2} (ref >90)
Glucose: 146 mg/dl — ABNORMAL HIGH (ref 70–140)
Potassium: 3.6 mEq/L (ref 3.5–5.1)
Sodium: 136 mEq/L (ref 136–145)
Total Protein: 6.8 g/dL (ref 6.4–8.3)

## 2014-11-20 LAB — TECHNOLOGIST REVIEW

## 2014-11-20 NOTE — Progress Notes (Signed)
Husband states pt is scheduled to see Dr Haroldine Laws on Monday . I recommended they ask him about lasix and kdur instructions.

## 2014-11-20 NOTE — Telephone Encounter (Signed)
Pt confirmed MD visit per 06/15 POF, gave pt AVS and Calendar... KJ, sent msg to add chemo and r/s pt's CT to the wk before.Marland KitchenMarland Kitchen

## 2014-11-20 NOTE — Progress Notes (Signed)
[place] [phone]   [phone]  OFFICE PROGRESS NOTE   DIAGNOSIS: Recurrent non-small cell lung cancer initially diagnosed as Stage IIIA non-small cell lung cancer, adenocarcinoma with negative EGFR mutation and negative ALK gene translocation diagnosed in March of 2014   PRIOR THERAPY:  1) Concurrent chemoradiation with weekly carboplatin for AUC of 2 and paclitaxel 45 mg/M2, status post 8 cycles, last dose was given on 11/13/2012 with partial response.  2) Consolidation chemotherapy with carboplatin for AUC of 5 on day 1 and gemcitabine 1000 mg/M2 on days 1 and 8 every 3 weeks, status post 3 cycles, last dose was given 02/20/2013 with mild improvement in her disease . First cycle was given on 01/02/2013.  3) status post subxiphoid pericardial window under the care of Dr. Roxan Hockey on 01/04/2014 and the final pathology showed no evidence for malignancy. 4) Systemic chemotherapy with carboplatin for AUC of 5 and Alimta 500 MG/M2 every 3 weeks. First dose 08/20/2014 discontinued secondary to intolerance  CURRENT THERAPY: Immunotherapy with Nivolumab 3 MG/KG every 2 weeks, first dose 11/28/2014.  CHEMOTHERAPY INTENT: Control  CURRENT # OF CHEMOTHERAPY CYCLES: 1  CURRENT ANTIEMETICS: Zofran, dexamethasone and Compazine  CURRENT SMOKING STATUS: Former smoker, quit in [place]: None  CURRENT BISPHOSPHONATES USE: None  PAIN MANAGEMENT: 8/10, currently Percocet 5/325, 1-2 tabs q 4 hours  NARCOTICS INDUCED CONSTIPATION: None  LIVING WILL AND CODE STATUS: Full code  INTERVAL HISTORY: Linda Donovan 59 y.o. female returns to the clinic today for followup visit accompanied by her husband. The patient has been on hospice recently for the decline in her condition. She has been doing fine over the last few weeks and she discussed with the hospice physician consideration of treatment. She was referred back to me today for  evaluation and discussion of her treatment options. She continues to have shortness of breath and currently on home oxygen. The patient also has dry cough. She denied having any significant chest pain or hemoptysis. She has no significant weight loss or night sweats. She has no fever or chills. She has no nausea or vomiting.  MEDICAL HISTORY: Past Medical History  Diagnosis Date  . COPD (chronic obstructive pulmonary disease)   . ADD (attention deficit disorder)   . Hx of radiation therapy 09/28/12- 11/17/12    RU lung mass, mediastinum chest, 63 gray 35 fx  . Pneumonia   . Arthritis   . Non-small cell lung cancer 09/18/2012    RUL  . Asthma     ALLERGIES:  is allergic to clinoril and morphine.  MEDICATIONS:  Current Outpatient Prescriptions  Medication Sig Dispense Refill  . ALPRAZolam (XANAX) 1 MG tablet Take 1 tablet (1 mg total) by mouth 3 (three) times daily. 90 tablet 0  . amitriptyline (ELAVIL) 50 MG tablet Take 1 tablet by mouth daily.  2  . aspirin 81 MG tablet Take 81 mg by mouth daily.    . benzonatate (TESSALON) 200 MG capsule Take 1 capsule by mouth daily.  3  . chlorpheniramine-HYDROcodone (TUSSIONEX) 10-8 MG/5ML LQCR Take 5 mLs by mouth every 12 (twelve) hours as needed.   0  . dextromethorphan-guaiFENesin (MUCINEX DM) 30-600 MG per 12 hr tablet Take 1 tablet by mouth 2 (two) times daily as needed for cough.    . digoxin (LANOXIN) 0.125 MG tablet Take 1 tablet (0.125 mg total) by mouth daily. 30 tablet 0  . docusate sodium (COLACE) 100 MG capsule  Take 1 capsule (100 mg total) by mouth 2 (two) times daily. 10 capsule 0  . folic acid (FOLVITE) 1 MG tablet Take 1 tablet (1 mg total) by mouth daily. 30 tablet 4  . furosemide (LASIX) 40 MG tablet Take 1 tablet (40 mg total) by mouth daily. 30 tablet 0  . HYDROcodone-homatropine (HYCODAN) 5-1.5 MG/5ML syrup Take 5 mLs by mouth every 6 (six) hours as needed for cough. 240 mL 0  . ipratropium (ATROVENT) 0.02 % nebulizer solution  Take 2.5 mLs (0.5 mg total) by nebulization 4 (four) times daily. 75 mL 12  . levalbuterol (XOPENEX HFA) 45 MCG/ACT inhaler Inhale 2 puffs into the lungs every 4 (four) hours as needed for wheezing. 1 Inhaler 11  . levalbuterol (XOPENEX) 0.63 MG/3ML nebulizer solution Take 3 mLs (0.63 mg total) by nebulization 4 (four) times daily. 3 mL 12  . losartan (COZAAR) 25 MG tablet Take 0.5 tablets (12.5 mg total) by mouth daily. 30 tablet 0  . metoprolol succinate (TOPROL-XL) 25 MG 24 hr tablet Take 25 mg by mouth daily.  5  . OXYGEN Inhale 4 L into the lungs continuous.     . polyethylene glycol (MIRALAX / GLYCOLAX) packet Take 17 g by mouth daily as needed for mild constipation or moderate constipation. 14 each 0  . potassium chloride SA (K-DUR,KLOR-CON) 20 MEQ tablet TAKE 1 TABLET BY MOUTH ONCE DAILY 30 tablet 0  . SENOKOT S 8.6-50 MG per tablet Take 2 tablets by mouth daily.  0  . spironolactone (ALDACTONE) 25 MG tablet Take 0.5 tablets (12.5 mg total) by mouth daily. 30 tablet 0  . sucralfate (CARAFATE) 1 G tablet Take 1 tablet (1 g total) by mouth 4 (four) times daily -  with meals and at bedtime. Dissolve in 10 ml of water prior to taking 120 tablet 0  . traZODone (DESYREL) 50 MG tablet Take 50 mg by mouth at bedtime as needed. May take 2-3 tablets qhs for sleep    . budesonide-formoterol (SYMBICORT) 160-4.5 MCG/ACT inhaler Inhale 2 puffs into the lungs 2 (two) times daily. (Patient not taking: Reported on 11/20/2014) 1 Inhaler 6  . oxyCODONE-acetaminophen (PERCOCET/ROXICET) 5-325 MG per tablet Take 1 tablet by mouth every 4 (four) hours as needed for severe pain. (Patient not taking: Reported on 11/20/2014) 30 tablet 0  . predniSONE (DELTASONE) 10 MG tablet Take 10 mg by mouth daily.  3  . prochlorperazine (COMPAZINE) 10 MG tablet Take 1 tablet (10 mg total) by mouth every 6 (six) hours as needed for nausea or vomiting. (Patient not taking: Reported on 10/25/2014) 30 tablet 0   No current  facility-administered medications for this visit.    SURGICAL HISTORY:  Past Surgical History  Procedure Laterality Date  . Total abdominal hysterectomy    . Shoulder arthroscopy    . Video bronchoscopy Bilateral 07/25/2012    Procedure: VIDEO BRONCHOSCOPY WITH FLUORO;  Surgeon: Tanda Rockers, MD;  Location: [facility];  Service: Endoscopy;  Laterality: Bilateral;  . Lump removed from breasr right  2003  . Endobronchial ultrasound Bilateral 09/04/2012    Procedure: ENDOBRONCHIAL ULTRASOUND;  Surgeon: Collene Gobble, MD;  Location: [facility];  Service: Cardiopulmonary;  Laterality: Bilateral;  . Breast surgery    . Subxyphoid pericardial window N/A 01/04/2014    Procedure: SUBXYPHOID PERICARDIAL WINDOW;  Surgeon: [name], MD;  Location: [place];  Service: Thoracic;  Laterality: N/A;  . Intraoperative transesophageal echocardiogram N/A 01/04/2014    Procedure: INTRAOPERATIVE TRANSESOPHAGEAL ECHOCARDIOGRAM;  Surgeon: [name], MD;  Location: [place];  Service: Open Heart Surgery;  Laterality: N/A;    REVIEW OF SYSTEMS:  Constitutional: positive for fatigue Eyes: negative Ears, nose, mouth, throat, and face: negative Respiratory: positive for cough, dyspnea on exertion and wheezing Cardiovascular: negative Gastrointestinal: negative Genitourinary:negative Integument/breast: negative Hematologic/lymphatic: negative Musculoskeletal:negative Neurological: negative Behavioral/Psych: negative Endocrine: negative Allergic/Immunologic: negative   PHYSICAL EXAMINATION: General appearance: alert, cooperative, fatigued and no distress Head: Normocephalic, without obvious abnormality, atraumatic Neck: no adenopathy, no JVD, supple, symmetrical, trachea midline and thyroid not enlarged, symmetric, no tenderness/mass/nodules Lymph nodes: Cervical, supraclavicular, and axillary nodes normal. Resp: clear to auscultation bilaterally and normal percussion bilaterally Back:  symmetric, no curvature. ROM normal. No CVA tenderness. Cardio: regular rate and rhythm, S1, S2 normal, no murmur, click, rub or gallop and normal apical impulse GI: soft, non-tender; bowel sounds normal; no masses,  no organomegaly Extremities: extremities normal, atraumatic, no cyanosis or edema Neurologic: Alert and oriented X 3, normal strength and tone. Normal symmetric reflexes. Normal coordination and gait  ECOG PERFORMANCE STATUS: 2 - Symptomatic, <50% confined to bed  Blood pressure 111/64, pulse 66, temperature 98.3 F (36.8 C), temperature source Oral, resp. rate 19, height $RemoveBe'5\' 3"'WUeJXbsoq$  (1.6 m), weight 135 lb 9.6 oz (61.508 kg), SpO2 93 %.  LABORATORY DATA: Lab Results  Component Value Date   WBC 18.8* 11/20/2014   HGB 12.8 11/20/2014   HCT 38.7 11/20/2014   MCV 100.5 11/20/2014   PLT 363 11/20/2014      Chemistry      Component Value Date/Time   NA 136 11/20/2014 1339   NA 139 09/19/2014 0500   K 3.6 11/20/2014 1339   K 3.3* 09/19/2014 0500   CL 89* 09/19/2014 0500   CL 104 11/13/2012 1122   CO2 38* 11/20/2014 1339   CO2 46* 09/19/2014 0500   BUN 8.9 11/20/2014 1339   BUN 14 09/19/2014 0500   CREATININE 0.6 11/20/2014 1339   CREATININE 0.44* 09/19/2014 0500      Component Value Date/Time   CALCIUM 9.0 10/02/2014 1146   CALCIUM 8.3* 09/19/2014 0500   ALKPHOS 68 10/02/2014 1146   ALKPHOS 83 01/06/2014 0311   AST 17 10/02/2014 1146   AST 20 01/06/2014 0311   ALT 24 10/02/2014 1146   ALT 15 01/06/2014 0311   BILITOT 0.72 10/02/2014 1146   BILITOT 0.3 01/06/2014 0311       RADIOGRAPHIC STUDIES: No results found.  ASSESSMENT AND PLAN: This is a very pleasant 59 years old white female with stage IIIA non-small cell lung cancer, adenocarcinoma with negative EGFR mutation and negative ALK gene translocation is status post concurrent chemoradiation with weekly carboplatin and paclitaxel followed by consolidation chemotherapy with 3 cycles of carboplatin and  gemcitabine. The patient was found to have evidence for disease progression and was started on systemic chemotherapy with one cycle of carboplatin and Alimta. This was complicated with pancytopenia. The patient was also recently treated for COPD exacerbation as well as congestive heart failure. Because of the comorbidities and deterioration of her condition she was referred to the palliative care and hospice.  She has been on hospice for the last 2 months but the patient is interested in resuming treatment. I discussed with her the treatment options including treatment with immunotherapy with Nivolumab 3 MG/KG every 2 weeks. I discussed with the patient adverse effect of this treatment including but not limited to immune mediated diarrhea, skin rash, liver, renal, thyroid or other endocrine dysfunction. She  would like to proceed with the treatment as planned and she is expected to start the first dose of her treatment on 11/28/2014. I will try to reschedule her restaging scan to be performed next week as a baseline before starting the immunotherapy. She would come back for follow-up visit in 3 weeks for reevaluation before starting cycle #2 of her treatment. The patient was advised to call immediately if she has any concerning symptoms in the interval.  The patient voices understanding of current disease status and treatment options and is in agreement with the current care plan.  All questions were answered. The patient knows to call the clinic with any problems, questions or concerns. We can certainly see the patient much sooner if necessary.  Disclaimer: This note was dictated with voice recognition software. Similar sounding words can inadvertently be transcribed and may not be corrected upon review.

## 2014-11-21 ENCOUNTER — Telehealth: Payer: Self-pay | Admitting: *Deleted

## 2014-11-21 NOTE — Telephone Encounter (Signed)
Per staff message and POF I have scheduled appts. Advised scheduler of appts. JMW  

## 2014-11-25 ENCOUNTER — Ambulatory Visit (HOSPITAL_COMMUNITY)
Admission: RE | Admit: 2014-11-25 | Discharge: 2014-11-25 | Disposition: A | Discharge status: Home / Self Care | Payer: BLUE CROSS/BLUE SHIELD | Source: Ambulatory Visit | Attending: Cardiology | Admitting: Cardiology

## 2014-11-25 ENCOUNTER — Telehealth: Payer: Self-pay | Admitting: *Deleted

## 2014-11-25 ENCOUNTER — Other Ambulatory Visit: Payer: Self-pay | Admitting: *Deleted

## 2014-11-25 ENCOUNTER — Encounter (HOSPITAL_COMMUNITY): Payer: Self-pay

## 2014-11-25 VITALS — BP 108/64 | HR 100 | Wt 132.8 lb

## 2014-11-25 DIAGNOSIS — R0600 Dyspnea, unspecified: Secondary | ICD-10-CM

## 2014-11-25 DIAGNOSIS — I5022 Chronic systolic (congestive) heart failure: Secondary | ICD-10-CM | POA: Diagnosis not present

## 2014-11-25 DIAGNOSIS — J449 Chronic obstructive pulmonary disease, unspecified: Secondary | ICD-10-CM | POA: Diagnosis not present

## 2014-11-25 DIAGNOSIS — I5021 Acute systolic (congestive) heart failure: Secondary | ICD-10-CM

## 2014-11-25 DIAGNOSIS — J700 Acute pulmonary manifestations due to radiation: Secondary | ICD-10-CM | POA: Diagnosis not present

## 2014-11-25 DIAGNOSIS — C3490 Malignant neoplasm of unspecified part of unspecified bronchus or lung: Secondary | ICD-10-CM | POA: Insufficient documentation

## 2014-11-25 DIAGNOSIS — J9610 Chronic respiratory failure, unspecified whether with hypoxia or hypercapnia: Secondary | ICD-10-CM | POA: Insufficient documentation

## 2014-11-25 MED ORDER — SPIRONOLACTONE 25 MG PO TABS: 12.5000 mg | ORAL_TABLET | Freq: Every day | ORAL | Status: DC

## 2014-11-25 MED ORDER — DIGOXIN 125 MCG PO TABS: 0.1250 mg | ORAL_TABLET | Freq: Every day | ORAL | Status: DC

## 2014-11-25 MED ORDER — HYDROCODONE-HOMATROPINE 5-1.5 MG/5ML PO SYRP: 5.0000 mL | ORAL_SOLUTION | Freq: Four times a day (QID) | ORAL | Status: DC | PRN

## 2014-11-25 MED ORDER — LOSARTAN POTASSIUM 25 MG PO TABS: 12.5000 mg | ORAL_TABLET | Freq: Every day | ORAL | Status: AC

## 2014-11-25 NOTE — Patient Instructions (Signed)
Your physician has requested that you have an echocardiogram. Echocardiography is a painless test that uses sound waves to create images of your heart. It provides your doctor with information about the size and shape of your heart and how well your heart's chambers and valves are working. This procedure takes approximately one hour. There are no restrictions for this procedure.  Your physician recommends that you schedule a follow-up appointment in: 6 weeks

## 2014-11-25 NOTE — Progress Notes (Signed)
Patient ID: Jackquline Berlin, female   DOB: [birth year], 59 y.o.   MRN: [id number]   SUBJECTIVE:   Ms [place] 59 yo female with h/o recurrent lung cancer, severe COPD, prior pericardial window and systolic HF. Has been undergoing radiation therapy and chemotherapy for recurrent lung cancer. Admitted in 4/16 for respiratory failure. CXR suggestive of RLL pneumonia/pneumonitis and RUL with invasive carcinoma.   EF by MRI was 26% and RVF EF 36%. Mild to moderate TR with mild Pulmonary HTN also noted. 09/13/2014  ECHO EF 25-30% Mild RV HK. RVSP 60 (normal echo in 8/15)  Returns to clinic for f/u. No longer getting chemo or XRT.Says she feels ok. Since d/c has had her diuretics adjusted several times. Takes 40 mg lasix every morning. If weight up 1.5 pounds or more takes extra lasix. Happens about 1x/week. Edema under control. Walks about 10 feet and gets SOB. No orthopnea or PND. BP on low end.   Labs:  11/20/14: Potassium 3.6 Creatinine 0.6   Past Medical History  Diagnosis Date  . COPD (chronic obstructive pulmonary disease)   . ADD (attention deficit disorder)   . Hx of radiation therapy 09/28/12- 11/17/12    RU lung mass, mediastinum chest, 63 gray 35 fx  . Pneumonia   . Arthritis   . Non-small cell lung cancer 09/18/2012    RUL  . Asthma    Current Outpatient Prescriptions on File Prior to Encounter  Medication Sig Dispense Refill  . ALPRAZolam (XANAX) 1 MG tablet Take 1 tablet (1 mg total) by mouth 3 (three) times daily. 90 tablet 0  . amitriptyline (ELAVIL) 50 MG tablet Take 1 tablet by mouth daily.  2  . aspirin 81 MG tablet Take 81 mg by mouth daily.    . benzonatate (TESSALON) 200 MG capsule Take 1 capsule by mouth daily.  3  . dextromethorphan-guaiFENesin (MUCINEX DM) 30-600 MG per 12 hr tablet Take 1 tablet by mouth 2 (two) times daily as needed for cough.    . digoxin (LANOXIN) 0.125 MG tablet Take 1 tablet (0.125 mg total) by mouth daily. 30 tablet 0  . docusate sodium (COLACE)  100 MG capsule Take 1 capsule (100 mg total) by mouth 2 (two) times daily. 10 capsule 0  . folic acid (FOLVITE) 1 MG tablet Take 1 tablet (1 mg total) by mouth daily. 30 tablet 4  . furosemide (LASIX) 40 MG tablet Take 1 tablet (40 mg total) by mouth daily. 30 tablet 0  . HYDROcodone-homatropine (HYCODAN) 5-1.5 MG/5ML syrup Take 5 mLs by mouth every 6 (six) hours as needed for cough. 240 mL 0  . ipratropium (ATROVENT) 0.02 % nebulizer solution Take 2.5 mLs (0.5 mg total) by nebulization 4 (four) times daily. 75 mL 12  . levalbuterol (XOPENEX HFA) 45 MCG/ACT inhaler Inhale 2 puffs into the lungs every 4 (four) hours as needed for wheezing. 1 Inhaler 11  . levalbuterol (XOPENEX) 0.63 MG/3ML nebulizer solution Take 3 mLs (0.63 mg total) by nebulization 4 (four) times daily. 3 mL 12  . losartan (COZAAR) 25 MG tablet Take 0.5 tablets (12.5 mg total) by mouth daily. 30 tablet 0  . metoprolol succinate (TOPROL-XL) 25 MG 24 hr tablet Take 25 mg by mouth daily.  5  . OXYGEN Inhale 4 L into the lungs continuous.     . polyethylene glycol (MIRALAX / GLYCOLAX) packet Take 17 g by mouth daily as needed for mild constipation or moderate constipation. 14 each 0  . potassium chloride SA (  K-DUR,KLOR-CON) 20 MEQ tablet TAKE 1 TABLET BY MOUTH ONCE DAILY 30 tablet 0  . predniSONE (DELTASONE) 10 MG tablet Take 10 mg by mouth daily.  3  . SENOKOT S 8.6-50 MG per tablet Take 2 tablets by mouth daily.  0  . spironolactone (ALDACTONE) 25 MG tablet Take 0.5 tablets (12.5 mg total) by mouth daily. 30 tablet 0  . sucralfate (CARAFATE) 1 G tablet Take 1 tablet (1 g total) by mouth 4 (four) times daily -  with meals and at bedtime. Dissolve in 10 ml of water prior to taking 120 tablet 0  . traZODone (DESYREL) 50 MG tablet Take 50 mg by mouth at bedtime as needed. May take 2-3 tablets qhs for sleep    . budesonide-formoterol (SYMBICORT) 160-4.5 MCG/ACT inhaler Inhale 2 puffs into the lungs 2 (two) times daily. (Patient not  taking: Reported on 11/20/2014) 1 Inhaler 6  . oxyCODONE-acetaminophen (PERCOCET/ROXICET) 5-325 MG per tablet Take 1 tablet by mouth every 4 (four) hours as needed for severe pain. (Patient not taking: Reported on 11/20/2014) 30 tablet 0  . prochlorperazine (COMPAZINE) 10 MG tablet Take 1 tablet (10 mg total) by mouth every 6 (six) hours as needed for nausea or vomiting. (Patient not taking: Reported on 10/25/2014) 30 tablet 0   No current facility-administered medications on file prior to encounter.     OBJECTIVE:   Vitals:   Filed Vitals:   11/25/14 0921  BP: 108/64  Pulse: 100  Weight: 132 lb 12 oz (60.215 kg)  SpO2: 96%     PHYSICAL EXAM General:  Sitting in WC Chronically ill appearing. NAD Head: Eyes PERRLA, No xanthomas.   Normal cephalic and atramatic  Lungs:  Coarse BS. No wheeze. Dull on right Heart:   Distant. Regularr. No obvious s3  Abdomen: Bowel sounds are positive, abdomen soft and non-tender without masses  Extremities:   No clubbing, cyanosis or edema. Neuro: Alert and oriented X 3. Psych:  Good affect, responds appropriately   ASSESSMENT AND PLAN 1. Chronic Systolic HF-  Left ventricular systolic dysfunction, new since August 2015 . possibly secondary to chemotherapy>  EF by MRI was 26% and RVF EF 36%.   Mild to moderate TR with mild Pulmonary HTN also noted.  -- She is much improved from HF perspective. Volume status looks good. I am hoping she has had some LV recovery. BP too low to push meds further. Continue current emds -- Reinforced need for daily weights and reviewed use of sliding scale diuretics. -- Plan echo tomorrow to re-evaluate EF prior to treatment with biologics. 2. Chronic respiratory failure 3. Advanced COPD  4. Recurrent non-small cell lung cancer 5. Radiation pnuemonitis   Matraca Hunkins,MD 9:57 AM

## 2014-11-25 NOTE — Telephone Encounter (Signed)
Error

## 2014-11-25 NOTE — Telephone Encounter (Signed)
Pt called with request for refill on Hycodan.  Pt previously on Hydromet while on hospice.  Pt discharged from Hospice and returned to active treatment. Will review with MD

## 2014-11-25 NOTE — Telephone Encounter (Signed)
Notified pt's husband Hycodan rx ready for pick up.

## 2014-11-26 ENCOUNTER — Ambulatory Visit (HOSPITAL_BASED_OUTPATIENT_CLINIC_OR_DEPARTMENT_OTHER)
Admission: RE | Admit: 2014-11-26 | Discharge: 2014-11-26 | Disposition: A | Discharge status: Home / Self Care | Payer: BLUE CROSS/BLUE SHIELD | Source: Ambulatory Visit | Attending: Internal Medicine | Admitting: Internal Medicine

## 2014-11-26 ENCOUNTER — Encounter (HOSPITAL_COMMUNITY): Payer: Self-pay

## 2014-11-26 ENCOUNTER — Ambulatory Visit (HOSPITAL_COMMUNITY)
Admission: RE | Admit: 2014-11-26 | Discharge: 2014-11-26 | Disposition: A | Discharge status: Home / Self Care | Payer: BLUE CROSS/BLUE SHIELD | Source: Ambulatory Visit | Attending: Internal Medicine | Admitting: Internal Medicine

## 2014-11-26 DIAGNOSIS — C3491 Malignant neoplasm of unspecified part of right bronchus or lung: Secondary | ICD-10-CM

## 2014-11-26 DIAGNOSIS — R0600 Dyspnea, unspecified: Secondary | ICD-10-CM

## 2014-11-26 DIAGNOSIS — I5021 Acute systolic (congestive) heart failure: Secondary | ICD-10-CM

## 2014-11-26 DIAGNOSIS — I5022 Chronic systolic (congestive) heart failure: Secondary | ICD-10-CM | POA: Diagnosis not present

## 2014-11-26 MED ORDER — PERFLUTREN LIPID MICROSPHERE
INTRAVENOUS | Status: AC
  Filled 2014-11-26: qty 10

## 2014-11-26 MED ORDER — PERFLUTREN LIPID MICROSPHERE
1.0000 mL | INTRAVENOUS | Status: AC | PRN
  Administered 2014-11-26: 3 mL via INTRAVENOUS

## 2014-11-26 MED ORDER — SODIUM CHLORIDE 0.9 % IV SOLN
INTRAVENOUS | Status: DC
  Filled 2014-11-26: qty 1000

## 2014-11-26 MED ORDER — IOHEXOL 300 MG/ML  SOLN
100.0000 mL | Freq: Once | INTRAMUSCULAR | Status: AC | PRN
  Administered 2014-11-26: 80 mL via INTRAVENOUS

## 2014-11-26 NOTE — Progress Notes (Signed)
  Echocardiogram 2D Echocardiogram with Definity has been performed.  Jennette Dubin 11/26/2014, 11:12 AM

## 2014-11-27 ENCOUNTER — Other Ambulatory Visit: Payer: BLUE CROSS/BLUE SHIELD

## 2014-11-28 ENCOUNTER — Other Ambulatory Visit (HOSPITAL_BASED_OUTPATIENT_CLINIC_OR_DEPARTMENT_OTHER): Payer: BLUE CROSS/BLUE SHIELD

## 2014-11-28 ENCOUNTER — Ambulatory Visit (HOSPITAL_BASED_OUTPATIENT_CLINIC_OR_DEPARTMENT_OTHER): Payer: BLUE CROSS/BLUE SHIELD

## 2014-11-28 VITALS — BP 110/69 | HR 97 | Temp 97.2°F | Resp 20

## 2014-11-28 DIAGNOSIS — C3491 Malignant neoplasm of unspecified part of right bronchus or lung: Secondary | ICD-10-CM

## 2014-11-28 DIAGNOSIS — Z5112 Encounter for antineoplastic immunotherapy: Secondary | ICD-10-CM

## 2014-11-28 DIAGNOSIS — I509 Heart failure, unspecified: Secondary | ICD-10-CM

## 2014-11-28 DIAGNOSIS — C3490 Malignant neoplasm of unspecified part of unspecified bronchus or lung: Secondary | ICD-10-CM

## 2014-11-28 LAB — CBC WITH DIFFERENTIAL/PLATELET
BASO%: 0.2 % (ref 0.0–2.0)
Basophils Absolute: 0 10*3/uL (ref 0.0–0.1)
EOS%: 1.6 % (ref 0.0–7.0)
Eosinophils Absolute: 0.3 10*3/uL (ref 0.0–0.5)
HCT: 36.5 % (ref 34.8–46.6)
HGB: 12.1 g/dL (ref 11.6–15.9)
LYMPH%: 3.7 % — AB (ref 14.0–49.7)
MCH: 32.8 pg (ref 25.1–34.0)
MCHC: 33.2 g/dL (ref 31.5–36.0)
MCV: 98.9 fL (ref 79.5–101.0)
MONO#: 1.3 10*3/uL — ABNORMAL HIGH (ref 0.1–0.9)
MONO%: 6.7 % (ref 0.0–14.0)
NEUT#: 17 10*3/uL — ABNORMAL HIGH (ref 1.5–6.5)
NEUT%: 87.8 % — ABNORMAL HIGH (ref 38.4–76.8)
PLATELETS: 361 10*3/uL (ref 145–400)
RBC: 3.69 10*6/uL — AB (ref 3.70–5.45)
RDW: 12.5 % (ref 11.2–14.5)
WBC: 19.4 10*3/uL — AB (ref 3.9–10.3)
lymph#: 0.7 10*3/uL — ABNORMAL LOW (ref 0.9–3.3)

## 2014-11-28 LAB — COMPREHENSIVE METABOLIC PANEL (CC13)
ALT: 15 U/L (ref 0–55)
ANION GAP: 8 meq/L (ref 3–11)
AST: 16 U/L (ref 5–34)
Albumin: 3.1 g/dL — ABNORMAL LOW (ref 3.5–5.0)
Alkaline Phosphatase: 81 U/L (ref 40–150)
BUN: 6.3 mg/dL — AB (ref 7.0–26.0)
CO2: 36 mEq/L — ABNORMAL HIGH (ref 22–29)
Calcium: 9.4 mg/dL (ref 8.4–10.4)
Chloride: 90 mEq/L — ABNORMAL LOW (ref 98–109)
Creatinine: 0.6 mg/dL (ref 0.6–1.1)
EGFR: >90 mL/min/{1.73_m2} (ref >90)
GLUCOSE: 162 mg/dL — AB (ref 70–140)
Potassium: 3.6 mEq/L (ref 3.5–5.1)
SODIUM: 134 meq/L — AB (ref 136–145)
Total Bilirubin: 0.33 mg/dL (ref 0.20–1.20)
Total Protein: 6.6 g/dL (ref 6.4–8.3)

## 2014-11-28 LAB — TSH CHCC: TSH: 1.71 m(IU)/L (ref 0.308–3.960)

## 2014-11-28 MED ORDER — SODIUM CHLORIDE 0.9 % IV SOLN
Freq: Once | INTRAVENOUS | Status: AC
  Administered 2014-11-28: 10:00:00 via INTRAVENOUS

## 2014-11-28 MED ORDER — SODIUM CHLORIDE 0.9 % IV SOLN
3.0000 mg/kg | Freq: Once | INTRAVENOUS | Status: AC
  Administered 2014-11-28: 180 mg via INTRAVENOUS
  Filled 2014-11-28: qty 18

## 2014-11-28 NOTE — Patient Instructions (Signed)
[facility] Discharge Instructions for Patients Receiving Chemotherapy  Today you received the following chemotherapy agents:  Nivolumab.  To help prevent nausea and vomiting after your treatment, we encourage you to take your nausea medication as directed.   If you develop nausea and vomiting that is not controlled by your nausea medication, call the clinic.   BELOW ARE SYMPTOMS THAT SHOULD BE REPORTED IMMEDIATELY:  *FEVER GREATER THAN 100.5 F  *CHILLS WITH OR WITHOUT FEVER  NAUSEA AND VOMITING THAT IS NOT CONTROLLED WITH YOUR NAUSEA MEDICATION  *UNUSUAL SHORTNESS OF BREATH  *UNUSUAL BRUISING OR BLEEDING  TENDERNESS IN MOUTH AND THROAT WITH OR WITHOUT PRESENCE OF ULCERS  *URINARY PROBLEMS  *BOWEL PROBLEMS  UNUSUAL RASH Items with * indicate a potential emergency and should be followed up as soon as possible.  Feel free to call the clinic you have any questions or concerns. The clinic phone number is [phone].  Please show the CHEMO ALERT CARD at check-in to the Emergency Department and triage nurse.   

## 2014-11-29 ENCOUNTER — Telehealth: Payer: Self-pay

## 2014-11-29 NOTE — Telephone Encounter (Signed)
-----   Message from Cora Collum, RN sent at 11/28/2014 10:48 AM EDT ----- Regarding: Chemo follow up call. Dr. Julien Nordmann 1st time Nivolumab

## 2014-11-29 NOTE — Telephone Encounter (Signed)
S/w pt she is a little fatigued. She denies n/v, no loss apetite, no muscle aches, she is eating and drinking. Encouraged to call if any questions or concerns.

## 2014-12-02 ENCOUNTER — Other Ambulatory Visit (HOSPITAL_COMMUNITY): Payer: Self-pay | Admitting: Internal Medicine

## 2014-12-02 ENCOUNTER — Ambulatory Visit (HOSPITAL_COMMUNITY): Payer: BLUE CROSS/BLUE SHIELD

## 2014-12-03 ENCOUNTER — Ambulatory Visit: Payer: BLUE CROSS/BLUE SHIELD | Admitting: Internal Medicine

## 2014-12-04 ENCOUNTER — Ambulatory Visit: Payer: BLUE CROSS/BLUE SHIELD

## 2014-12-04 ENCOUNTER — Telehealth: Payer: Self-pay | Admitting: Internal Medicine

## 2014-12-04 ENCOUNTER — Ambulatory Visit (HOSPITAL_BASED_OUTPATIENT_CLINIC_OR_DEPARTMENT_OTHER): Payer: BLUE CROSS/BLUE SHIELD | Admitting: Nurse Practitioner

## 2014-12-04 VITALS — BP 105/72 | HR 98 | Temp 98.9°F | Resp 17 | Ht 63.0 in | Wt 138.7 lb

## 2014-12-04 DIAGNOSIS — C3490 Malignant neoplasm of unspecified part of unspecified bronchus or lung: Secondary | ICD-10-CM

## 2014-12-04 DIAGNOSIS — C3491 Malignant neoplasm of unspecified part of right bronchus or lung: Secondary | ICD-10-CM

## 2014-12-04 NOTE — Progress Notes (Signed)
Linda OFFICE PROGRESS Donovan   DIAGNOSIS: Recurrent non-small cell lung cancer initially diagnosed as Stage IIIA non-small cell lung cancer, adenocarcinoma with negative EGFR mutation and negative ALK gene translocation diagnosed in March of 2014   PRIOR THERAPY:  1) Concurrent chemoradiation with weekly carboplatin for AUC of 2 and paclitaxel 45 mg/M2, status post 8 cycles, last dose was given on 11/13/2012 with partial response.  2) Consolidation chemotherapy with carboplatin for AUC of 5 on day 1 and gemcitabine 1000 mg/M2 on days 1 and 8 every 3 weeks, status post 3 cycles, last dose was given 02/20/2013 with mild improvement in her disease . First cycle was given on 01/02/2013.  3) status post subxiphoid pericardial window under the care of Dr. Roxan Hockey on 01/04/2014 and the final pathology showed no evidence for malignancy. 4) Systemic chemotherapy with carboplatin for AUC of 5 and Alimta 500 MG/M2 every 3 weeks. First dose 08/20/2014 discontinued secondary to intolerance  CURRENT THERAPY: Immunotherapy with Nivolumab 3 MG/KG every 2 weeks, first dose 11/28/2014.   INTERVAL HISTORY:   Linda Donovan returns as scheduled. She completed cycle 1 nivolumab 11/28/2014. She denies nausea/vomiting. No mouth sores. No diarrhea. No rash. She has stable dyspnea. She continues oxygen at 4 L/m. She has an occasional cough. No hemoptysis. No fever. Appetite varies. Yesterday she noted onset of right shoulder blade pain. She had similar pain 6-7 months ago. She describes the pain as "sharp". She took oxycodone yesterday with improvement.  Objective:  Vital signs in last 24 hours:  Blood pressure 105/72, pulse 98, temperature 98.9 F (37.2 C), temperature source Oral, resp. rate 17, height $RemoveBe'5\' 3"'ceXZEAqNl$  (1.6 m), weight 138 lb 11.2 oz (62.914 kg), SpO2 93 %, peak flow 4 L/min.    HEENT: No thrush or ulcers. Lymphatics: No palpable cervical or supra-clavicular lymph nodes. Resp:  Lungs with bilateral rhonchi. Breath sounds diminished at the right lower lung field. No respiratory distress. Cardio: Regular rate and rhythm. GI: Abdomen soft and nontender. No hepatomegaly. Vascular: No leg edema. Calves soft and nontender. Neuro: Alert and oriented.  Skin: No rash. Musculoskeletal: Full appearance right mid to lower scapula. No discrete mass.    Lab Results:  Lab Results  Component Value Date   WBC 19.4* 11/28/2014   HGB 12.1 11/28/2014   HCT 36.5 11/28/2014   MCV 98.9 11/28/2014   PLT 361 11/28/2014   NEUTROABS 17.0* 11/28/2014    Imaging:  No results found.  Medications: I have reviewed the patient's current medications.  Assessment/Plan: 1. Stage IIIA non-small cell lung cancer diagnosed March 2014, adenocarcinoma with negative EGFR mutation and negative ALK gene translocation status post concurrent chemoradiation with weekly carboplatin and paclitaxel followed by consolidation chemotherapy with 3 cycles of carboplatin and gemcitabine. Found to have evidence for disease progression and started on systemic chemotherapy with one cycle of carboplatin and Alimta. This was complicated with pancytopenia. She was also recently treated for COPD exacerbation as well as congestive heart failure. Because of the comorbidities and deterioration of her condition she was referred to the palliative care and hospice. At the time of her last visit with Dr. Julien Nordmann 11/20/2014 she was expressed interest in resuming treatment. Nivolumab initiated 11/28/2014.   Disposition: [name] appears stable. She has completed 1 cycle of nivolumab. She will return for cycle 2 on 12/12/2014.  She reports recent recurrent pain in the region of the right scapula. Dr. Julien Nordmann and I reviewed the recent chest CT images. No obvious evidence of  cancer involving the scapula. She will continue oxycodone as needed. She will contact the office if the pain worsens.  We will see her in follow-up prior  to cycle 3 on 12/26/2014. She will contact the office in the interim as outlined above or with any other problems.  Plan reviewed with Dr. Julien Nordmann.    Ned Card ANP/GNP-BC   12/04/2014  9:56 AM

## 2014-12-04 NOTE — Telephone Encounter (Signed)
Gave and printed appt sched adn avs fo rpt for July and Aug

## 2014-12-06 ENCOUNTER — Other Ambulatory Visit (HOSPITAL_COMMUNITY)
Admission: RE | Admit: 2014-12-06 | Discharge: 2014-12-06 | Disposition: A | Discharge status: Home / Self Care | Payer: BLUE CROSS/BLUE SHIELD | Source: Ambulatory Visit | Attending: Internal Medicine | Admitting: Internal Medicine

## 2014-12-06 ENCOUNTER — Telehealth (HOSPITAL_COMMUNITY): Payer: Self-pay | Admitting: *Deleted

## 2014-12-06 ENCOUNTER — Emergency Department (HOSPITAL_COMMUNITY): Payer: BLUE CROSS/BLUE SHIELD

## 2014-12-06 ENCOUNTER — Telehealth: Payer: Self-pay | Admitting: Internal Medicine

## 2014-12-06 ENCOUNTER — Other Ambulatory Visit (HOSPITAL_BASED_OUTPATIENT_CLINIC_OR_DEPARTMENT_OTHER): Payer: BLUE CROSS/BLUE SHIELD

## 2014-12-06 ENCOUNTER — Telehealth: Payer: Self-pay | Admitting: *Deleted

## 2014-12-06 ENCOUNTER — Encounter (HOSPITAL_COMMUNITY): Payer: Self-pay | Admitting: Emergency Medicine

## 2014-12-06 ENCOUNTER — Telehealth: Payer: Self-pay | Admitting: Emergency Medicine

## 2014-12-06 ENCOUNTER — Ambulatory Visit (HOSPITAL_BASED_OUTPATIENT_CLINIC_OR_DEPARTMENT_OTHER): Payer: BLUE CROSS/BLUE SHIELD | Admitting: Nurse Practitioner

## 2014-12-06 ENCOUNTER — Inpatient Hospital Stay (HOSPITAL_COMMUNITY)
Admission: EM | Admit: 2014-12-06 | Discharge: 2014-12-08 | DRG: 871 | Disposition: A | Discharge status: Home / Self Care | Payer: BLUE CROSS/BLUE SHIELD | Attending: Internal Medicine | Admitting: Internal Medicine

## 2014-12-06 VITALS — BP 132/77 | HR 107 | Temp 98.3°F | Resp 18 | Wt 134.6 lb

## 2014-12-06 DIAGNOSIS — Z9981 Dependence on supplemental oxygen: Secondary | ICD-10-CM | POA: Diagnosis not present

## 2014-12-06 DIAGNOSIS — C3490 Malignant neoplasm of unspecified part of unspecified bronchus or lung: Secondary | ICD-10-CM

## 2014-12-06 DIAGNOSIS — I5022 Chronic systolic (congestive) heart failure: Secondary | ICD-10-CM | POA: Diagnosis not present

## 2014-12-06 DIAGNOSIS — R5383 Other fatigue: Secondary | ICD-10-CM | POA: Diagnosis present

## 2014-12-06 DIAGNOSIS — J90 Pleural effusion, not elsewhere classified: Secondary | ICD-10-CM | POA: Diagnosis not present

## 2014-12-06 DIAGNOSIS — D72829 Elevated white blood cell count, unspecified: Secondary | ICD-10-CM | POA: Diagnosis not present

## 2014-12-06 DIAGNOSIS — Z90710 Acquired absence of both cervix and uterus: Secondary | ICD-10-CM

## 2014-12-06 DIAGNOSIS — J189 Pneumonia, unspecified organism: Secondary | ICD-10-CM | POA: Diagnosis present

## 2014-12-06 DIAGNOSIS — E873 Alkalosis: Secondary | ICD-10-CM | POA: Diagnosis present

## 2014-12-06 DIAGNOSIS — I10 Essential (primary) hypertension: Secondary | ICD-10-CM | POA: Diagnosis present

## 2014-12-06 DIAGNOSIS — E876 Hypokalemia: Secondary | ICD-10-CM | POA: Diagnosis present

## 2014-12-06 DIAGNOSIS — T501X5A Adverse effect of loop [high-ceiling] diuretics, initial encounter: Secondary | ICD-10-CM | POA: Diagnosis present

## 2014-12-06 DIAGNOSIS — Z9889 Other specified postprocedural states: Secondary | ICD-10-CM

## 2014-12-06 DIAGNOSIS — C3411 Malignant neoplasm of upper lobe, right bronchus or lung: Secondary | ICD-10-CM | POA: Diagnosis present

## 2014-12-06 DIAGNOSIS — F419 Anxiety disorder, unspecified: Secondary | ICD-10-CM | POA: Diagnosis present

## 2014-12-06 DIAGNOSIS — F329 Major depressive disorder, single episode, unspecified: Secondary | ICD-10-CM | POA: Diagnosis present

## 2014-12-06 DIAGNOSIS — C3491 Malignant neoplasm of unspecified part of right bronchus or lung: Secondary | ICD-10-CM | POA: Diagnosis not present

## 2014-12-06 DIAGNOSIS — R0602 Shortness of breath: Secondary | ICD-10-CM | POA: Diagnosis present

## 2014-12-06 DIAGNOSIS — R0689 Other abnormalities of breathing: Secondary | ICD-10-CM

## 2014-12-06 DIAGNOSIS — Z8249 Family history of ischemic heart disease and other diseases of the circulatory system: Secondary | ICD-10-CM | POA: Diagnosis not present

## 2014-12-06 DIAGNOSIS — Z825 Family history of asthma and other chronic lower respiratory diseases: Secondary | ICD-10-CM | POA: Diagnosis not present

## 2014-12-06 DIAGNOSIS — Z923 Personal history of irradiation: Secondary | ICD-10-CM | POA: Diagnosis not present

## 2014-12-06 DIAGNOSIS — Z87891 Personal history of nicotine dependence: Secondary | ICD-10-CM | POA: Diagnosis not present

## 2014-12-06 DIAGNOSIS — R0600 Dyspnea, unspecified: Secondary | ICD-10-CM

## 2014-12-06 DIAGNOSIS — I420 Dilated cardiomyopathy: Secondary | ICD-10-CM | POA: Diagnosis present

## 2014-12-06 DIAGNOSIS — J441 Chronic obstructive pulmonary disease with (acute) exacerbation: Secondary | ICD-10-CM | POA: Diagnosis present

## 2014-12-06 DIAGNOSIS — J9621 Acute and chronic respiratory failure with hypoxia: Secondary | ICD-10-CM | POA: Diagnosis not present

## 2014-12-06 DIAGNOSIS — Z66 Do not resuscitate: Secondary | ICD-10-CM | POA: Diagnosis present

## 2014-12-06 DIAGNOSIS — A419 Sepsis, unspecified organism: Principal | ICD-10-CM | POA: Diagnosis present

## 2014-12-06 DIAGNOSIS — Z9221 Personal history of antineoplastic chemotherapy: Secondary | ICD-10-CM | POA: Diagnosis not present

## 2014-12-06 DIAGNOSIS — J9601 Acute respiratory failure with hypoxia: Secondary | ICD-10-CM | POA: Diagnosis not present

## 2014-12-06 LAB — CBC WITH DIFFERENTIAL/PLATELET
BASO%: 0.2 % (ref 0.0–2.0)
Basophils Absolute: 0 10*3/uL (ref 0.0–0.1)
EOS%: 1.3 % (ref 0.0–7.0)
Eosinophils Absolute: 0.2 10*3/uL (ref 0.0–0.5)
HCT: 42.3 % (ref 34.8–46.6)
HGB: 13.4 g/dL (ref 11.6–15.9)
LYMPH#: 0.7 10*3/uL — AB (ref 0.9–3.3)
LYMPH%: 4.1 % — ABNORMAL LOW (ref 14.0–49.7)
MCH: 32.2 pg (ref 25.1–34.0)
MCHC: 31.7 g/dL (ref 31.5–36.0)
MCV: 101.7 fL — AB (ref 79.5–101.0)
MONO#: 1.7 10*3/uL — ABNORMAL HIGH (ref 0.1–0.9)
MONO%: 9.5 % (ref 0.0–14.0)
NEUT%: 84.9 % — AB (ref 38.4–76.8)
NEUTROS ABS: 15.2 10*3/uL — AB (ref 1.5–6.5)
Platelets: 425 10*3/uL — ABNORMAL HIGH (ref 145–400)
RBC: 4.16 10*6/uL (ref 3.70–5.45)
RDW: 12.6 % (ref 11.2–14.5)
WBC: 17.9 10*3/uL — AB (ref 3.9–10.3)

## 2014-12-06 LAB — COMPREHENSIVE METABOLIC PANEL (CC13)
ALBUMIN: 3 g/dL — AB (ref 3.5–5.0)
ALT: 16 U/L (ref 0–55)
AST: 15 U/L (ref 5–34)
Alkaline Phosphatase: 87 U/L (ref 40–150)
Anion Gap: 14 mEq/L — ABNORMAL HIGH (ref 3–11)
BILIRUBIN TOTAL: 0.37 mg/dL (ref 0.20–1.20)
BUN: 9.9 mg/dL (ref 7.0–26.0)
CO2: 41 mEq/L — ABNORMAL HIGH (ref 22–29)
Calcium: 10.4 mg/dL (ref 8.4–10.4)
Chloride: 82 mEq/L — ABNORMAL LOW (ref 98–109)
Creatinine: 0.6 mg/dL (ref 0.6–1.1)
EGFR: >90 mL/min/{1.73_m2} (ref >90)
Glucose: 181 mg/dl — ABNORMAL HIGH (ref 70–140)
Potassium: 3.8 mEq/L (ref 3.5–5.1)
Sodium: 137 mEq/L (ref 136–145)
Total Protein: 7.1 g/dL (ref 6.4–8.3)

## 2014-12-06 LAB — URINE MICROSCOPIC-ADD ON

## 2014-12-06 LAB — BLOOD GAS, ARTERIAL
Acid-Base Excess: 18.3 mmol/L — ABNORMAL HIGH (ref 0.0–2.0)
Bicarbonate: 45.8 mEq/L — ABNORMAL HIGH (ref 20.0–24.0)
O2 Content: 2 L/min
O2 Saturation: 91.8 %
PCO2 ART: 65.9 mmHg — AB (ref 35.0–45.0)
PH ART: 7.456 — AB (ref 7.350–7.450)
Patient temperature: 98.6
TCO2: 40.7 mmol/L (ref 0–100)
pO2, Arterial: 61.8 mmHg — ABNORMAL LOW (ref 80.0–100.0)

## 2014-12-06 LAB — URINALYSIS, ROUTINE W REFLEX MICROSCOPIC
GLUCOSE, UA: NEGATIVE mg/dL
Hgb urine dipstick: NEGATIVE
NITRITE: NEGATIVE
PROTEIN: NEGATIVE mg/dL
Specific Gravity, Urine: 1.023 (ref 1.005–1.030)
UROBILINOGEN UA: 1 mg/dL (ref 0.0–1.0)
pH: 6 (ref 5.0–8.0)

## 2014-12-06 LAB — I-STAT CHEM 8, ED
BUN: 9 mg/dL (ref 6–20)
CHLORIDE: 81 mmol/L — AB (ref 101–111)
Calcium, Ion: 1.08 mmol/L — ABNORMAL LOW (ref 1.13–1.30)
Creatinine, Ser: 0.6 mg/dL (ref 0.44–1.00)
GLUCOSE: 133 mg/dL — AB (ref 65–99)
HEMATOCRIT: 42 % (ref 36.0–46.0)
Hemoglobin: 14.3 g/dL (ref 12.0–15.0)
POTASSIUM: 3.5 mmol/L (ref 3.5–5.1)
SODIUM: 131 mmol/L — AB (ref 135–145)
TCO2: 44 mmol/L (ref 0–100)

## 2014-12-06 LAB — DIGOXIN LEVEL: Digoxin Level: 0.2 ng/mL — ABNORMAL LOW (ref 0.8–2.0)

## 2014-12-06 LAB — BRAIN NATRIURETIC PEPTIDE: B Natriuretic Peptide: 113.9 pg/mL — ABNORMAL HIGH (ref 0.0–100.0)

## 2014-12-06 MED ORDER — SENNOSIDES-DOCUSATE SODIUM 8.6-50 MG PO TABS
1.0000 | ORAL_TABLET | Freq: Every day | ORAL | Status: DC
  Administered 2014-12-06: 1 via ORAL
  Filled 2014-12-06 (×2): qty 1

## 2014-12-06 MED ORDER — POTASSIUM CHLORIDE CRYS ER 20 MEQ PO TBCR
20.0000 meq | EXTENDED_RELEASE_TABLET | Freq: Every day | ORAL | Status: DC
  Administered 2014-12-07 – 2014-12-08 (×2): 20 meq via ORAL
  Filled 2014-12-06 (×2): qty 1

## 2014-12-06 MED ORDER — LEVALBUTEROL TARTRATE 45 MCG/ACT IN AERO: 2.0000 | INHALATION_SPRAY | RESPIRATORY_TRACT | Status: DC | PRN

## 2014-12-06 MED ORDER — ALBUTEROL SULFATE (2.5 MG/3ML) 0.083% IN NEBU
5.0000 mg | INHALATION_SOLUTION | Freq: Once | RESPIRATORY_TRACT | Status: AC
  Administered 2014-12-06: 2.5 mg via RESPIRATORY_TRACT
  Filled 2014-12-06: qty 6

## 2014-12-06 MED ORDER — METOPROLOL SUCCINATE ER 25 MG PO TB24
25.0000 mg | ORAL_TABLET | Freq: Every day | ORAL | Status: DC
  Administered 2014-12-07 – 2014-12-08 (×2): 25 mg via ORAL
  Filled 2014-12-06 (×2): qty 1

## 2014-12-06 MED ORDER — IPRATROPIUM BROMIDE 0.02 % IN SOLN
0.5000 mg | Freq: Once | RESPIRATORY_TRACT | Status: AC
  Administered 2014-12-06: 0.5 mg via RESPIRATORY_TRACT
  Filled 2014-12-06: qty 2.5

## 2014-12-06 MED ORDER — ALPRAZOLAM 1 MG PO TABS
1.0000 mg | ORAL_TABLET | Freq: Three times a day (TID) | ORAL | Status: DC
Start: 2014-12-06 — End: 2014-12-08
  Administered 2014-12-06 – 2014-12-08 (×4): 1 mg via ORAL
  Filled 2014-12-06 (×5): qty 1

## 2014-12-06 MED ORDER — AMITRIPTYLINE HCL 50 MG PO TABS
50.0000 mg | ORAL_TABLET | Freq: Every day | ORAL | Status: DC
  Administered 2014-12-07 – 2014-12-08 (×2): 50 mg via ORAL
  Filled 2014-12-06 (×2): qty 1

## 2014-12-06 MED ORDER — LEVALBUTEROL HCL 0.63 MG/3ML IN NEBU
0.6300 mg | INHALATION_SOLUTION | RESPIRATORY_TRACT | Status: DC | PRN
  Administered 2014-12-07: 0.63 mg via RESPIRATORY_TRACT
  Filled 2014-12-06: qty 3

## 2014-12-06 MED ORDER — IPRATROPIUM BROMIDE 0.02 % IN SOLN
0.5000 mg | Freq: Four times a day (QID) | RESPIRATORY_TRACT | Status: DC
  Administered 2014-12-07 – 2014-12-08 (×4): 0.5 mg via RESPIRATORY_TRACT
  Filled 2014-12-06 (×5): qty 2.5

## 2014-12-06 MED ORDER — HYDROCODONE-HOMATROPINE 5-1.5 MG/5ML PO SYRP: 5.0000 mL | ORAL_SOLUTION | Freq: Four times a day (QID) | ORAL | Status: DC | PRN

## 2014-12-06 MED ORDER — PIPERACILLIN-TAZOBACTAM 3.375 G IVPB
3.3750 g | Freq: Three times a day (TID) | INTRAVENOUS | Status: DC
  Administered 2014-12-06 – 2014-12-08 (×5): 3.375 g via INTRAVENOUS
  Filled 2014-12-06 (×6): qty 50

## 2014-12-06 MED ORDER — FUROSEMIDE 40 MG PO TABS
40.0000 mg | ORAL_TABLET | Freq: Every day | ORAL | Status: DC
  Administered 2014-12-07 – 2014-12-08 (×2): 40 mg via ORAL
  Filled 2014-12-06 (×2): qty 1

## 2014-12-06 MED ORDER — SUCRALFATE 1 G PO TABS
1.0000 g | ORAL_TABLET | Freq: Three times a day (TID) | ORAL | Status: DC
  Administered 2014-12-06 – 2014-12-08 (×6): 1 g via ORAL
  Filled 2014-12-06 (×8): qty 1

## 2014-12-06 MED ORDER — DM-GUAIFENESIN ER 30-600 MG PO TB12
1.0000 | ORAL_TABLET | Freq: Two times a day (BID) | ORAL | Status: DC | PRN
  Administered 2014-12-06: 1 via ORAL
  Filled 2014-12-06 (×2): qty 1

## 2014-12-06 MED ORDER — PREDNISONE 10 MG PO TABS
10.0000 mg | ORAL_TABLET | Freq: Every day | ORAL | Status: DC
  Administered 2014-12-07 – 2014-12-08 (×2): 10 mg via ORAL
  Filled 2014-12-06 (×2): qty 1

## 2014-12-06 MED ORDER — SPIRONOLACTONE 12.5 MG HALF TABLET
12.5000 mg | ORAL_TABLET | Freq: Every day | ORAL | Status: DC
  Administered 2014-12-07 – 2014-12-08 (×2): 12.5 mg via ORAL
  Filled 2014-12-06 (×2): qty 1

## 2014-12-06 MED ORDER — BENZONATATE 100 MG PO CAPS
200.0000 mg | ORAL_CAPSULE | Freq: Every day | ORAL | Status: DC
  Administered 2014-12-07 – 2014-12-08 (×2): 200 mg via ORAL
  Filled 2014-12-06 (×2): qty 2

## 2014-12-06 MED ORDER — LOSARTAN POTASSIUM 25 MG PO TABS
12.5000 mg | ORAL_TABLET | Freq: Every day | ORAL | Status: DC
  Administered 2014-12-07 – 2014-12-08 (×2): 12.5 mg via ORAL
  Filled 2014-12-06 (×2): qty 0.5

## 2014-12-06 MED ORDER — DIGOXIN 125 MCG PO TABS
0.1250 mg | ORAL_TABLET | Freq: Every day | ORAL | Status: DC
  Administered 2014-12-07 – 2014-12-08 (×2): 0.125 mg via ORAL
  Filled 2014-12-06 (×2): qty 1

## 2014-12-06 MED ORDER — DOCUSATE SODIUM 100 MG PO CAPS
100.0000 mg | ORAL_CAPSULE | Freq: Two times a day (BID) | ORAL | Status: DC
  Administered 2014-12-06 – 2014-12-07 (×2): 100 mg via ORAL
  Filled 2014-12-06 (×5): qty 1

## 2014-12-06 MED ORDER — POLYETHYLENE GLYCOL 3350 17 G PO PACK: 17.0000 g | PACK | Freq: Every day | ORAL | Status: DC | PRN

## 2014-12-06 MED ORDER — LEVALBUTEROL HCL 0.63 MG/3ML IN NEBU
0.6300 mg | INHALATION_SOLUTION | Freq: Four times a day (QID) | RESPIRATORY_TRACT | Status: DC
  Administered 2014-12-07 – 2014-12-08 (×4): 0.63 mg via RESPIRATORY_TRACT
  Filled 2014-12-06 (×5): qty 3

## 2014-12-06 NOTE — Telephone Encounter (Signed)
Labs/ov per 07/01 POF added to NP/CB schedule pt is aware... KJ

## 2014-12-06 NOTE — Telephone Encounter (Signed)
Family member called stating that she is feeling very fatigued and having difficulty swallowing. Patient recently diagnosed with CHF and has restricted fluid intake (1quart/day). Patient currently on nivolumab treatment.

## 2014-12-06 NOTE — ED Notes (Signed)
Bed: WA25 Expected date:  Expected time:  Means of arrival:  Comments: Cancer center 

## 2014-12-06 NOTE — Telephone Encounter (Signed)
PA has been received for Xopenex. Form has been filled out. Will await RB's return to office to sign form.

## 2014-12-06 NOTE — H&P (Signed)
PCP: [facility]   Referring provider Jeneen Rinks   Chief Complaint:  Shortness of breath  HPI: Linda Donovan is a 59 y.o. female   has a past medical history of COPD (chronic obstructive pulmonary disease); ADD (attention deficit disorder); radiation therapy (09/28/12- 11/17/12); Pneumonia; Arthritis; Non-small cell lung cancer (09/18/2012); and Asthma.   Presented with patient have had gradual worsening of shortness of breath and fatigue. She is on 4 L of oxygen at baseline for hx of COPD and NSCLca. She is unable to flat. Denies any leg swelling. Pateint reports increased lethargy. Unable to emergency department chest extremities noted for significant right-sided pleural effusion. There was also noticeable possible small inflammatory versus infectious nodular opacity in the left. Could not ruled out pneumonia.  In ER ABG 7.456/65.9/61.8 bicarbonate 41. Patient able to maintain good Mental status she reports her shortness of breath but states it's tolerable until she is unable to lay down flat. The case has been discussed with interventional radiology by ER who recommended admission to medicine with the plan for thoracentesis in the morning.   Patient has history of recurrent non-small cell lung cancer diagnosed since March 2014, status post chemotherapy radiation with partial response. Intravenous complicated by development of pericardial effusion requiring pericardial window in July 2015. In March patient's chemotherapy was discontinued secondary to her not being able to tolerate it she was transitioned to Hospice. But this was discontinued when oncology wanted to do a trial of immunotherapy with nivolumab.   Patient has known history of systolic congestive heart failure as well as significant COPD. Last echogram was in 11/26/2014. Showing  EF 65-70% with grade 1 diastolic dysfunction.   Hospitalist was called for admission for  pleural effusion.   Review of Systems:    Pertinent positives include:   shortness of breath at rest.  dyspnea on exertion  Constitutional:  No weight loss, night sweats, Fevers, chills, fatigue, weight loss  HEENT:  No headaches, Difficulty swallowing,Tooth/dental problems,Sore throat,  No sneezing, itching, ear ache, nasal congestion, post nasal drip,  Cardio-vascular:  No chest pain, Orthopnea, PND, anasarca, dizziness, palpitations.no Bilateral lower extremity swelling  GI:  No heartburn, indigestion, abdominal pain, nausea, vomiting, diarrhea, change in bowel habits, loss of appetite, melena, blood in stool, hematemesis Resp:    No excess mucus, no productive cough, No non-productive cough, No coughing up of blood.No change in color of mucus.No wheezing. Skin:  no rash or lesions. No jaundice GU:  no dysuria, change in color of urine, no urgency or frequency. No straining to urinate.  No flank pain.  Musculoskeletal:  No joint pain or no joint swelling. No decreased range of motion. No back pain.  Psych:  No change in mood or affect. No depression or anxiety. No memory loss.  Neuro: no localizing neurological complaints, no tingling, no weakness, no double vision, no gait abnormality, no slurred speech, no confusion  Otherwise ROS are negative except for above, 10 systems were reviewed  Past Medical History: Past Medical History  Diagnosis Date  . COPD (chronic obstructive pulmonary disease)   . ADD (attention deficit disorder)   . Hx of radiation therapy 09/28/12- 11/17/12    RU lung mass, mediastinum chest, 63 gray 35 fx  . Pneumonia   . Arthritis   . Non-small cell lung cancer 09/18/2012    RUL  . Asthma    Past Surgical History  Procedure Laterality Date  . Total abdominal hysterectomy    .  Shoulder arthroscopy    . Video bronchoscopy Bilateral 07/25/2012    Procedure: VIDEO BRONCHOSCOPY WITH FLUORO;  Surgeon: Tanda Rockers, MD;  Location: [facility];  Service:  Endoscopy;  Laterality: Bilateral;  . Lump removed from breasr right  2003  . Endobronchial ultrasound Bilateral 09/04/2012    Procedure: ENDOBRONCHIAL ULTRASOUND;  Surgeon: Collene Gobble, MD;  Location: [facility];  Service: Cardiopulmonary;  Laterality: Bilateral;  . Breast surgery    . Subxyphoid pericardial window N/A 01/04/2014    Procedure: SUBXYPHOID PERICARDIAL WINDOW;  Surgeon: [name], MD;  Location: [facility];  Service: Thoracic;  Laterality: N/A;  . Intraoperative transesophageal echocardiogram N/A 01/04/2014    Procedure: INTRAOPERATIVE TRANSESOPHAGEAL ECHOCARDIOGRAM;  Surgeon: [name], MD;  Location: [facility];  Service: Open Heart Surgery;  Laterality: N/A;     Medications: Prior to Admission medications   Medication Sig Start Date End Date Taking? Authorizing Provider  ALPRAZolam Duanne Moron) 1 MG tablet Take 1 tablet (1 mg total) by mouth 3 (three) times daily. 09/21/14  Yes Reyne Dumas, MD  amitriptyline (ELAVIL) 50 MG tablet Take 50 mg by mouth daily.  10/03/14  Yes Historical Provider, MD  aspirin 81 MG tablet Take 81 mg by mouth daily.   Yes Historical Provider, MD  benzonatate (TESSALON) 200 MG capsule Take 200 mg by mouth daily.  10/23/14  Yes Historical Provider, MD  dextromethorphan-guaiFENesin (MUCINEX DM) 30-600 MG per 12 hr tablet Take 1 tablet by mouth 2 (two) times daily as needed for cough.   Yes Historical Provider, MD  digoxin (LANOXIN) 0.125 MG tablet Take 1 tablet (0.125 mg total) by mouth daily. 11/25/14  Yes Larey Dresser, MD  docusate sodium (COLACE) 100 MG capsule Take 1 capsule (100 mg total) by mouth 2 (two) times daily. 09/21/14  Yes Reyne Dumas, MD  folic acid (FOLVITE) 1 MG tablet Take 1 tablet (1 mg total) by mouth daily. 08/13/14  Yes Curt Bears, MD  furosemide (LASIX) 40 MG tablet Take 1 tablet (40 mg total) by mouth daily. 11/18/14  Yes Curt Bears, MD  HYDROcodone-homatropine [facility]) 5-1.5 MG/5ML syrup Take 5 mLs by mouth  every 6 (six) hours as needed for cough. 11/25/14  Yes Curt Bears, MD  ipratropium (ATROVENT) 0.02 % nebulizer solution Take 2.5 mLs (0.5 mg total) by nebulization 4 (four) times daily. 09/21/14  Yes Reyne Dumas, MD  levalbuterol (XOPENEX HFA) 45 MCG/ACT inhaler Inhale 2 puffs into the lungs every 4 (four) hours as needed for wheezing. 10/08/14  Yes Collene Gobble, MD  levalbuterol (XOPENEX) 0.63 MG/3ML nebulizer solution Take 3 mLs (0.63 mg total) by nebulization 4 (four) times daily. 09/21/14  Yes Reyne Dumas, MD  losartan (COZAAR) 25 MG tablet Take 0.5 tablets (12.5 mg total) by mouth daily. 11/25/14  Yes Larey Dresser, MD  metoprolol succinate (TOPROL-XL) 25 MG 24 hr tablet Take 25 mg by mouth daily. 08/16/14  Yes Historical Provider, MD  OXYGEN Inhale 4 L into the lungs continuous.    Yes Historical Provider, MD  polyethylene glycol (MIRALAX / GLYCOLAX) packet Take 17 g by mouth daily as needed for mild constipation or moderate constipation. 09/21/14  Yes Reyne Dumas, MD  potassium chloride SA (K-DUR,KLOR-CON) 20 MEQ tablet TAKE 1 TABLET BY MOUTH EVERY DAY 12/02/14  Yes Jolaine Artist, MD  predniSONE (DELTASONE) 10 MG tablet Take 10 mg by mouth daily. 11/05/14  Yes Historical Provider, MD  SENOKOT S 8.6-50 MG per tablet Take 1 tablet by mouth  at bedtime.  11/05/14  Yes Historical Provider, MD  spironolactone (ALDACTONE) 25 MG tablet Take 0.5 tablets (12.5 mg total) by mouth daily. 11/25/14  Yes Larey Dresser, MD  sucralfate (CARAFATE) 1 G tablet Take 1 tablet (1 g total) by mouth 4 (four) times daily -  with meals and at bedtime. Dissolve in 10 ml of water prior to taking 09/10/14  Yes Gery Pray, MD  traZODone (DESYREL) 50 MG tablet Take 50 mg by mouth at bedtime as needed for sleep.  11/06/14  Yes Historical Provider, MD  oxyCODONE-acetaminophen (PERCOCET/ROXICET) 5-325 MG per tablet Take 1 tablet by mouth every 4 (four) hours as needed for severe pain. Patient not taking: Reported on  12/06/2014 08/13/14   Curt Bears, MD  prochlorperazine (COMPAZINE) 10 MG tablet Take 1 tablet (10 mg total) by mouth every 6 (six) hours as needed for nausea or vomiting. Patient not taking: Reported on 12/06/2014 08/13/14   Curt Bears, MD    Allergies:   Allergies  Allergen Reactions  . Clinoril [Sulindac] Hives  . Morphine Itching    Social History:  Ambulatory   independently have had hard time ambulating Lives at home  With family     reports that she quit smoking about 18 years ago. Her smoking use included Cigarettes. She has a 20 pack-year smoking history. She has never used smokeless tobacco. She reports that she drinks about 0.6 oz of alcohol per week. She reports that she does not use illicit drugs.    Family History: family history includes COPD in her paternal uncle; Cancer in her maternal grandfather and paternal grandmother; Heart disease in her maternal grandmother and mother; Prostate cancer in her maternal grandfather.    Physical Exam: Patient Vitals for the past 24 hrs:  BP Temp Temp src Pulse Resp SpO2  12/06/14 1900 113/76 mmHg - - 107 19 96 %  12/06/14 1842 116/70 mmHg - - 109 18 96 %  12/06/14 1600 117/74 mmHg - - 106 22 96 %  12/06/14 1544 124/70 mmHg 98.1 F (36.7 C) Oral 103 20 95 %    1. General:  in No Acute distress 2. Psychological: Alert and  Oriented 3. Head/ENT:    Dry Mucous Membranes                          Head Non traumatic, neck supple                          Normal    Dentition 4. SKIN:  decreased Skin turgor,  Skin clean Dry and intact no rash 5. Heart: Regular rate and rhythm no Murmur, Rub or gallop 6. Lungs:  no wheezes some crackles  On the right with diminished air movement 7. Abdomen: Soft, non-tender, Non distended 8. Lower extremities: no clubbing, cyanosis, or edema 9. Neurologically Grossly intact, moving all 4 extremities equally 10. MSK: Normal range of motion  body mass index is unknown because there is no  weight on file.   Labs on Admission:   Results for orders placed or performed during the hospital encounter of 12/06/14 (from the past 24 hour(s))  I-stat chem 8, ed     Status: Abnormal   Collection Time: 12/06/14  4:38 PM  Result Value Ref Range   Sodium 131 (L) 135 - 145 mmol/L   Potassium 3.5 3.5 - 5.1 mmol/L   Chloride 81 (L) 101 - 111 mmol/L  BUN 9 6 - 20 mg/dL   Creatinine, Ser 0.60 0.44 - 1.00 mg/dL   Glucose, Bld 133 (H) 65 - 99 mg/dL   Calcium, Ion 1.08 (L) 1.13 - 1.30 mmol/L   TCO2 44 0 - 100 mmol/L   Hemoglobin 14.3 12.0 - 15.0 g/dL   HCT 42.0 36.0 - 46.0 %  Blood gas, arterial     Status: Abnormal   Collection Time: 12/06/14  6:12 PM  Result Value Ref Range   O2 Content 2.0 L/min   pH, Arterial 7.456 (H) 7.350 - 7.450   pCO2 arterial 65.9 (HH) 35.0 - 45.0 mmHg   pO2, Arterial 61.8 (L) 80.0 - 100.0 mmHg   Bicarbonate 45.8 (H) 20.0 - 24.0 mEq/L   TCO2 40.7 0 - 100 mmol/L   Acid-Base Excess 18.3 (H) 0.0 - 2.0 mmol/L   O2 Saturation 91.8 %   Patient temperature 98.6    Collection site LEFT RADIAL    Drawn by COLLECTED BY RT    Sample type ARTERIAL DRAW    Allens test (pass/fail) PASS PASS    UA not obtained  No results found for: HGBA1C  Estimated Creatinine Clearance: 61.9 mL/min (by C-G formula based on Cr of 0.6).  BNP (last 3 results)  Recent Labs  01/03/14 1338  PROBNP 149.8*    Other results:  I have pearsonaly reviewed this: ECG REPORT not obtained  There were no vitals filed for this visit.   Cultures:    Component Value Date/Time   SDES SPUTUM 09/13/2014 0727   SDES SPUTUM 09/13/2014 0727   SPECREQUEST NONE 09/13/2014 0727   SPECREQUEST NONE 09/13/2014 0727   CULT  09/13/2014 0727    NORMAL OROPHARYNGEAL FLORA Performed at [facility] 09/13/2014 FINAL 09/13/2014 0727   REPTSTATUS 09/15/2014 FINAL 09/13/2014 0727     Radiological Exams on Admission: Dg Chest 2 View  12/06/2014   CLINICAL DATA:  Fatigue.   Right upper lobe lung cancer.  EXAM: CHEST  2 VIEW  COMPARISON:  11/26/2014  FINDINGS: Further reduction in aeration in the right lung, with a small amount of aerated upper lobe and a small amount of aerated right middle lobe. Underlying pleural effusion suspected with extensive atelectasis. There is some shift of cardiac and mediastinal structures to the right as well as rightward tracheal shaft.  Severe emphysema. Left mid lung linear and nodular opacity, indistinct, new.  Thoracic spondylosis.  IMPRESSION: 1. Considerably reduced aeration in the right lung with only a small amount of right upper lobe and right middle lobe still aerated. This represents a significant worsening from 11/26/2014 with regard to the amount of aeration. There shift of the trachea and mediastinal structures to the right indicating atelectasis. 2. Markedly severe emphysema. 3. Indistinct linear nodular opacity in the left mid lung is nonspecific and could be inflammatory or infectious. This is less likely to be malignant given that it was not present 10 days ago.   Electronically Signed   By: Van Clines M.D.   On: 12/06/2014 16:47    Chart has been reviewed  Family  at  Bedside  plan of care was discussed with  Husband Linda Donovan [phone]  Assessment/Plan  59 year old female history of non-small cell lung cancer who presents with worsening dyspnea was found to have worsening right pleural effusion with possible left small infiltrate being admitted for diagnostic as well as therapeutic thoracentesis to be performed tomorrow by IR  Present on Admission:  . Pleural  effusion -given history of mass in the same region most likely malignant. Given somewhat elevated white blood cell count and an possibility of postobstructive infection will obtain cultures of pleural fluid cover with Zosyn for now  . Acute on chronic respiratory failure with hypoxia likely secondary to worsening pleural effusion will undergo  therapeutic thoracentesis in the morning. We'll continue to monitor carefully overnight.  . DCM (dilated cardiomyopathy) - last echogram showed preserved  EF likely resolved. We'll continue with Lasix and monitor fluid status  . Non-small cell lung cancer - currently on immunotherapy will need to notify oncology at the patient is here tomorrow  . COPD  GOLD III stable currently on 4 L of oxygen appears to be end-stage will continue home medications  Prophylaxis: SCD   CODE STATUS:   DNR/DNI as per patient  patient would like [place] to concentrate mainly on her comfort but would like to have thoracentesis done. Otherwise patient is not interested in aggressive interventions.   Disposition:  To home once workup is complete and patient is stable, Patient not interested in placement  Other plan as per orders.  I have spent a total of 55 min on this admission  Jazion Atteberry 12/06/2014, 8:02 PM  [facility]  Pager [phone]   after 2 AM please page floor coverage PA If 7AM-7PM, please contact the day team taking care of the patient  [email or web address]  Password [email or web address]

## 2014-12-06 NOTE — Telephone Encounter (Signed)
AT 12:50PM RECEIVED A CALLED FROM PT.'S DAUGHTER. NO RETURN CALL FROM PT.'S CARDIOLOGIST. WOULD LIKE TO SEE CYNDEE BACON,NP TODAY. POF TO SCHEDULER.

## 2014-12-06 NOTE — Telephone Encounter (Signed)
Pts daughter called stated that pt is very fatigued and having trouble swallowing. She is not taking her medication because she is afraid she will choke.  The daughter spoke with oncology thinking it might have something to do with pt chemotherapy and was advised to give the pt fluids. She said she did not give her fluids as we have put her on fluid restrictions she requests callback from a nurse. Please advise.

## 2014-12-06 NOTE — ED Notes (Addendum)
Pt presents to [facility] with increased fatigue. CO2 41 at this time. Pt is on 4L O2 [place]. Hx COPD. On fluid restriction. Pt arousable and oriented. Lower extremities are mottled. Labs drawn today. Hx CHF. On Nuvolumab-has been extremely fatigued since 6/23 (2nd dose).   Addendum-patient denies any other symptoms except extreme fatigue. Says treatment on 6/23 was her first treatment. Denies fever/diaphoresis/N/V. Denies productive cough. No recent sickness or surgeries. Doesn't wear CPAP or BiPAP at night. Did not have Lasix dose today. Rhonchi and rales present bilaterally when auscultating lungs. Diminished lung sounds in lower lobes. Denies chest pain. Husband at bedside.

## 2014-12-06 NOTE — ED Notes (Signed)
Pt sipping on a little water trying to get [place] a urine sample. Said she would let us know she she needed to go.

## 2014-12-06 NOTE — ED Provider Notes (Addendum)
Pt seen and evaluated.  Care D/W PA.  History reviewed with patient, and chart reviewed.  Patient had previously been on full treatment for her recurrent lung cancer. Spent a short time in hospice. Did reinitiate treatment with immunotherapy starting 2 weeks ago. Has effusion, atelectasis, and marked volume loss of right lung. Has an acute on chronic respiratory acidosis. Is hypercapnic. However is awake and is not somnolent. Mentating well. Care discussed with Dr. Darnell Level, hospitalist. Patient be admitted for a.m. thoracentesis. I did discuss with the radiology staff that staff  That this procedure would be available  Tomorrow, on a Saturday. I think she is appropriate for admission tonight for an a.m. procedure.  Tanna Furry, MD 12/06/14 2009  Tanna Furry, MD 12/11/14 [phone]

## 2014-12-06 NOTE — ED Provider Notes (Signed)
CSN: [id number]     Arrival date & time 12/06/14  1524 History   First MD Initiated Contact with Patient 12/06/14 1556     Chief Complaint  Patient presents with  . Fatigue     (Consider location/radiation/quality/duration/timing/severity/associated sxs/prior Treatment) HPI   Linda Donovan is a 59 year old female with history of COPD, CHF, non-small cell lung cancer stage III, currently restarted chemotherapy 11/23/2014, was previously on palliative care, who presents to the emergency department today after going to the cancer center with increased fatigue, and was found to have a CO2 of 41. She is chronically on 4 L oxygen at home and fluid restriction, but the past 2 days she has been excessively sleepy, and today did not take any of her medicines, including Lasix.  Her last scan was 11/26/2014, which showed a loculated right pleural effusion, which was stable, but several signs progressive pleural metastasis.  She complains of very slow and progressive exertional dyspnea, with orthopnea, but denies any fever, chills, sweats, or increased cough or sputum production.   ER RN noted lower extremity "mottling."   Pt does have LE cool to the touch, she reports that her feet are often cold however she denies any lower extremity pain or edema.   She has not had any chest pain, lightheadedness, abdominal pain.    Past Medical History  Diagnosis Date  . COPD (chronic obstructive pulmonary disease)   . ADD (attention deficit disorder)   . Hx of radiation therapy 09/28/12- 11/17/12    RU lung mass, mediastinum chest, 63 gray 35 fx  . Pneumonia   . Arthritis   . Non-small cell lung cancer 09/18/2012    RUL  . Asthma    Past Surgical History  Procedure Laterality Date  . Total abdominal hysterectomy    . Shoulder arthroscopy    . Video bronchoscopy Bilateral 07/25/2012    Procedure: VIDEO BRONCHOSCOPY WITH FLUORO;  Surgeon: Tanda Rockers, MD;  Location: [facility];  Service: Endoscopy;  Laterality:  Bilateral;  . Lump removed from breasr right  2003  . Endobronchial ultrasound Bilateral 09/04/2012    Procedure: ENDOBRONCHIAL ULTRASOUND;  Surgeon: Collene Gobble, MD;  Location: [facility];  Service: Cardiopulmonary;  Laterality: Bilateral;  . Breast surgery    . Subxyphoid pericardial window N/A 01/04/2014    Procedure: SUBXYPHOID PERICARDIAL WINDOW;  Surgeon: [name], MD;  Location: [place];  Service: Thoracic;  Laterality: N/A;  . Intraoperative transesophageal echocardiogram N/A 01/04/2014    Procedure: INTRAOPERATIVE TRANSESOPHAGEAL ECHOCARDIOGRAM;  Surgeon: [name], MD;  Location: [place];  Service: Open Heart Surgery;  Laterality: N/A;   Family History  Problem Relation Age of Onset  . COPD Paternal Uncle   . Heart disease Mother   . Heart disease Maternal Grandmother   . Cancer Paternal Grandmother     breast  . Prostate cancer Maternal Grandfather   . Cancer Maternal Grandfather     prostate   History  Substance Use Topics  . Smoking status: Former Smoker -- 1.00 packs/day for 20 years    Types: Cigarettes    Quit date: 06/07/1996  . Smokeless tobacco: Never Used  . Alcohol Use: 0.6 oz/week    1 Glasses of wine per week     Comment: one glass of wine "a few times per week"   OB History    No data available     Review of Systems 10 Systems reviewed and are negative for acute change except as noted  in the HPI.      Allergies  Clinoril and Morphine  Home Medications   Prior to Admission medications   Medication Sig Start Date End Date Taking? Authorizing Provider  ALPRAZolam Duanne Moron) 1 MG tablet Take 1 tablet (1 mg total) by mouth 3 (three) times daily. 09/21/14  Yes Reyne Dumas, MD  amitriptyline (ELAVIL) 50 MG tablet Take 50 mg by mouth daily.  10/03/14  Yes Historical Provider, MD  aspirin 81 MG tablet Take 81 mg by mouth daily.   Yes Historical Provider, MD  benzonatate (TESSALON) 200 MG capsule Take 200 mg by mouth daily.  10/23/14   Yes Historical Provider, MD  dextromethorphan-guaiFENesin (MUCINEX DM) 30-600 MG per 12 hr tablet Take 1 tablet by mouth 2 (two) times daily as needed for cough.   Yes Historical Provider, MD  digoxin (LANOXIN) 0.125 MG tablet Take 1 tablet (0.125 mg total) by mouth daily. 11/25/14  Yes Larey Dresser, MD  docusate sodium (COLACE) 100 MG capsule Take 1 capsule (100 mg total) by mouth 2 (two) times daily. 09/21/14  Yes Reyne Dumas, MD  folic acid (FOLVITE) 1 MG tablet Take 1 tablet (1 mg total) by mouth daily. 08/13/14  Yes Curt Bears, MD  furosemide (LASIX) 40 MG tablet Take 1 tablet (40 mg total) by mouth daily. 11/18/14  Yes Curt Bears, MD  HYDROcodone-homatropine [facility]) 5-1.5 MG/5ML syrup Take 5 mLs by mouth every 6 (six) hours as needed for cough. 11/25/14  Yes Curt Bears, MD  ipratropium (ATROVENT) 0.02 % nebulizer solution Take 2.5 mLs (0.5 mg total) by nebulization 4 (four) times daily. 09/21/14  Yes Reyne Dumas, MD  levalbuterol (XOPENEX HFA) 45 MCG/ACT inhaler Inhale 2 puffs into the lungs every 4 (four) hours as needed for wheezing. 10/08/14  Yes Collene Gobble, MD  levalbuterol (XOPENEX) 0.63 MG/3ML nebulizer solution Take 3 mLs (0.63 mg total) by nebulization 4 (four) times daily. 09/21/14  Yes Reyne Dumas, MD  losartan (COZAAR) 25 MG tablet Take 0.5 tablets (12.5 mg total) by mouth daily. 11/25/14  Yes Larey Dresser, MD  metoprolol succinate (TOPROL-XL) 25 MG 24 hr tablet Take 25 mg by mouth daily. 08/16/14  Yes Historical Provider, MD  OXYGEN Inhale 4 L into the lungs continuous.    Yes Historical Provider, MD  polyethylene glycol (MIRALAX / GLYCOLAX) packet Take 17 g by mouth daily as needed for mild constipation or moderate constipation. 09/21/14  Yes Reyne Dumas, MD  potassium chloride SA (K-DUR,KLOR-CON) 20 MEQ tablet TAKE 1 TABLET BY MOUTH EVERY DAY 12/02/14  Yes Jolaine Artist, MD  predniSONE (DELTASONE) 10 MG tablet Take 10 mg by mouth daily. 11/05/14  Yes Historical  Provider, MD  SENOKOT S 8.6-50 MG per tablet Take 1 tablet by mouth at bedtime.  11/05/14  Yes Historical Provider, MD  spironolactone (ALDACTONE) 25 MG tablet Take 0.5 tablets (12.5 mg total) by mouth daily. 11/25/14  Yes Larey Dresser, MD  sucralfate (CARAFATE) 1 G tablet Take 1 tablet (1 g total) by mouth 4 (four) times daily -  with meals and at bedtime. Dissolve in 10 ml of water prior to taking 09/10/14  Yes Gery Pray, MD  traZODone (DESYREL) 50 MG tablet Take 50 mg by mouth at bedtime as needed for sleep.  11/06/14  Yes Historical Provider, MD  oxyCODONE-acetaminophen (PERCOCET/ROXICET) 5-325 MG per tablet Take 1 tablet by mouth every 4 (four) hours as needed for severe pain. Patient not taking: Reported on 12/06/2014 08/13/14   [facility]  Julien Nordmann, MD  prochlorperazine (COMPAZINE) 10 MG tablet Take 1 tablet (10 mg total) by mouth every 6 (six) hours as needed for nausea or vomiting. Patient not taking: Reported on 12/06/2014 08/13/14   Curt Bears, MD   BP 126/86 mmHg  Pulse 112  Temp(Src) 98.1 F (36.7 C) (Oral)  Resp 19  SpO2 96% Physical Exam  Constitutional: She is oriented to person, place, and time. She appears well-developed and well-nourished. No distress.  Chronically ill appearing female, appears older than stated age, seated upright in ER gurney, appears tired but in no acute distress  HENT:  Head: Normocephalic and atraumatic.  Right Ear: External ear normal.  Left Ear: External ear normal.  Nose: Nose normal.  Mouth/Throat: Oropharynx is clear and moist. No oropharyngeal exudate.  Eyes: Conjunctivae and EOM are normal. Pupils are equal, round, and reactive to light. Right eye exhibits no discharge. Left eye exhibits no discharge. No scleral icterus.  Neck: Normal range of motion. No JVD present. No tracheal deviation present. No thyromegaly present.  Cardiovascular: Regular rhythm, normal heart sounds and intact distal pulses.  Tachycardia present.  Exam reveals no gallop and  no friction rub.   No murmur heard. Pulses:      Radial pulses are 2+ on the right side, and 2+ on the left side.       Posterior tibial pulses are 0 on the right side, and 0 on the left side.  Posterior tibialis pulses not palpated, pulses present bilaterally with Doppler  Pulmonary/Chest: Effort normal. No accessory muscle usage. Tachypnea noted. No respiratory distress. She has decreased breath sounds in the right upper field, the right middle field and the right lower field. She has no wheezes. She has no rhonchi. She has no rales. Chest wall is dull to percussion. She exhibits no tenderness and no retraction.  Speaking in short sentences, frequent cough Right lung fields upper to lower, dull to percussion, decreased breath sounds on right, distant breath sounds on left lung fields auscultated posteriorly  Abdominal: Soft. Normal appearance and bowel sounds are normal. She exhibits no distension and no mass. There is no hepatosplenomegaly. There is no tenderness. There is no rebound, no guarding and no CVA tenderness.  Musculoskeletal: Normal range of motion. She exhibits no edema or tenderness.  Lymphadenopathy:    She has no cervical adenopathy.  Neurological: She is alert and oriented to person, place, and time. She has normal reflexes. She is not disoriented. She displays no tremor. No cranial nerve deficit or sensory deficit. She exhibits normal muscle tone. She displays no seizure activity. Coordination normal.  Skin: Skin is warm, dry and intact. No rash noted. She is not diaphoretic. No cyanosis or erythema. No pallor. Nails show no clubbing.  Psychiatric: She has a normal mood and affect. Her behavior is normal. Judgment and thought content normal.  Nursing note and vitals reviewed.   ED Course  Procedures (including critical care time) Labs Review Labs Reviewed  BLOOD GAS, ARTERIAL - Abnormal; Notable for the following:    pH, Arterial 7.456 (*)    pCO2 arterial 65.9 (*)     pO2, Arterial 61.8 (*)    Bicarbonate 45.8 (*)    Acid-Base Excess 18.3 (*)    All other components within normal limits  I-STAT CHEM 8, ED - Abnormal; Notable for the following:    Sodium 131 (*)    Chloride 81 (*)    Glucose, Bld 133 (*)    Calcium, Ion 1.08 (*)  All other components within normal limits  URINALYSIS, ROUTINE W REFLEX MICROSCOPIC (NOT AT [facility])   EMERGENCY DEPARTMENT US SOFT TISSUE INTERPRETATION "Study: Limited Ultrasound of the noted body part in comments below"  INDICATIONS: SOB, fatigue, NSCLCa, pt refusing CT scan  PERFORMED BY:  Myself  IMAGES ARCHIVED?: Yes  SIDE:Right   BODY PART:  Right chest  FINDINGS: Other right Pleural effusion  LIMITATIONS:  none  INTERPRETATION:  Pleural effusion   Imaging Review Dg Chest 2 View  12/06/2014   CLINICAL DATA:  Fatigue.  Right upper lobe lung cancer.  EXAM: CHEST  2 VIEW  COMPARISON:  11/26/2014  FINDINGS: Further reduction in aeration in the right lung, with a small amount of aerated upper lobe and a small amount of aerated right middle lobe. Underlying pleural effusion suspected with extensive atelectasis. There is some shift of cardiac and mediastinal structures to the right as well as rightward tracheal shaft.  Severe emphysema. Left mid lung linear and nodular opacity, indistinct, new.  Thoracic spondylosis.  IMPRESSION: 1. Considerably reduced aeration in the right lung with only a small amount of right upper lobe and right middle lobe still aerated. This represents a significant worsening from 11/26/2014 with regard to the amount of aeration. There shift of the trachea and mediastinal structures to the right indicating atelectasis. 2. Markedly severe emphysema. 3. Indistinct linear nodular opacity in the left mid lung is nonspecific and could be inflammatory or infectious. This is less likely to be malignant given that it was not present 10 days ago.   Electronically Signed   By: Van Clines M.D.   On:  12/06/2014 16:47    EKG Interpretation None      MDM   Final diagnoses:  Fatigue   Fatigue x 2d with progressive dyspnea  Plan CXR, I-Stat chem 8, ABG, breathing tx Labs were drawn 4 hours prior to ER presentation, patient requested conservative workup.    Labs pertinent for acute on chronic hypercarbia, with adequate oxygenation on 4 L O2 Chest x-ray shows reduced aeration in the right lung, pertinent for significant worsening from CT scan 12/26/2014, with possible left lung opacity. Patient has extreme anxiety, and declines CT scan of the chest at this time. I've explained the thoracentesis procedure to the patient. I have also showed the patient and family member who is present, the recent scans and the new chest x-ray.  I explained that this could possibly be a new chronic symptom of her cancer, that will likely need a plan of management. Patient was recently on hospice/palliative care, however initiated more aggressive chemotherapy within the past 2 weeks. I presented the new findings to the patient, as well as management options, and have left her to discuss her wishes with her family member.   Bedside ultrasound shows pleural effusion with lung atelectasis.   Dr. Jeneen Rinks is also to seen and evaluated the patient and discussed options with them at this time, please see his documentation. The patient is agreeable to be admitted at this time to manage her hypercarbia and to consult IR in the morning for therapeutic thoracentesis.  I discussed the need to establish a plan with her oncologist for treatment options in the future, such as repeated thoracentesis, returning to hospice care, etc.  Pt was admitted by Dr. Roel Cluck for pleural effusion and acute on chronic respiratory failure        Delsa Grana, PA-C 12/07/14 [place], MD 12/11/14 1859

## 2014-12-06 NOTE — Telephone Encounter (Signed)
Patient currently in ED to be further evaluated.

## 2014-12-07 ENCOUNTER — Encounter: Payer: Self-pay | Admitting: Nurse Practitioner

## 2014-12-07 ENCOUNTER — Inpatient Hospital Stay (HOSPITAL_COMMUNITY): Payer: BLUE CROSS/BLUE SHIELD

## 2014-12-07 DIAGNOSIS — C3491 Malignant neoplasm of unspecified part of right bronchus or lung: Secondary | ICD-10-CM

## 2014-12-07 DIAGNOSIS — R0689 Other abnormalities of breathing: Secondary | ICD-10-CM | POA: Insufficient documentation

## 2014-12-07 DIAGNOSIS — J9601 Acute respiratory failure with hypoxia: Secondary | ICD-10-CM

## 2014-12-07 DIAGNOSIS — A419 Sepsis, unspecified organism: Principal | ICD-10-CM

## 2014-12-07 DIAGNOSIS — D72829 Elevated white blood cell count, unspecified: Secondary | ICD-10-CM

## 2014-12-07 DIAGNOSIS — J189 Pneumonia, unspecified organism: Secondary | ICD-10-CM

## 2014-12-07 LAB — BODY FLUID CELL COUNT WITH DIFFERENTIAL
Eos, Fluid: 3 %
LYMPHS FL: 81 %
Monocyte-Macrophage-Serous Fluid: 4 % — ABNORMAL LOW (ref 50–90)
NEUTROPHIL FLUID: 13 % (ref 0–25)

## 2014-12-07 LAB — COMPREHENSIVE METABOLIC PANEL
ALBUMIN: 2.9 g/dL — AB (ref 3.5–5.0)
ALK PHOS: 70 U/L (ref 38–126)
ALT: 12 U/L — AB (ref 14–54)
ANION GAP: 10 (ref 5–15)
AST: 18 U/L (ref 15–41)
BUN: 12 mg/dL (ref 6–20)
CALCIUM: 9.2 mg/dL (ref 8.9–10.3)
CO2: 42 mmol/L — ABNORMAL HIGH (ref 22–32)
CREATININE: 0.46 mg/dL (ref 0.44–1.00)
Chloride: 80 mmol/L — ABNORMAL LOW (ref 101–111)
GFR calc Af Amer: >60 mL/min (ref >60)
GLUCOSE: 195 mg/dL — AB (ref 65–99)
Potassium: 3.1 mmol/L — ABNORMAL LOW (ref 3.5–5.1)
Sodium: 132 mmol/L — ABNORMAL LOW (ref 135–145)
TOTAL PROTEIN: 6.3 g/dL — AB (ref 6.5–8.1)
Total Bilirubin: 0.4 mg/dL (ref 0.3–1.2)

## 2014-12-07 LAB — GLUCOSE, SEROUS FLUID: Glucose, Fluid: 158 mg/dL

## 2014-12-07 LAB — CBC WITH DIFFERENTIAL/PLATELET
Basophils Absolute: 0 10*3/uL (ref 0.0–0.1)
Basophils Relative: 0 % (ref 0–1)
Eosinophils Absolute: 0.2 10*3/uL (ref 0.0–0.7)
Eosinophils Relative: 1 % (ref 0–5)
HEMATOCRIT: 38.7 % (ref 36.0–46.0)
HEMOGLOBIN: 12.1 g/dL (ref 12.0–15.0)
Lymphocytes Relative: 5 % — ABNORMAL LOW (ref 12–46)
Lymphs Abs: 0.9 10*3/uL (ref 0.7–4.0)
MCH: 31.3 pg (ref 26.0–34.0)
MCHC: 31.3 g/dL (ref 30.0–36.0)
MCV: 100.3 fL — ABNORMAL HIGH (ref 78.0–100.0)
MONO ABS: 0.8 10*3/uL (ref 0.1–1.0)
Monocytes Relative: 4 % (ref 3–12)
Neutro Abs: 17.1 10*3/uL — ABNORMAL HIGH (ref 1.7–7.7)
Neutrophils Relative %: 90 % — ABNORMAL HIGH (ref 43–77)
Platelets: 432 10*3/uL — ABNORMAL HIGH (ref 150–400)
RBC: 3.86 MIL/uL — AB (ref 3.87–5.11)
RDW: 12.5 % (ref 11.5–15.5)
WBC: 19 10*3/uL — ABNORMAL HIGH (ref 4.0–10.5)

## 2014-12-07 LAB — EXPECTORATED SPUTUM ASSESSMENT W GRAM STAIN, RFLX TO RESP C

## 2014-12-07 LAB — PROTEIN, BODY FLUID: TOTAL PROTEIN, FLUID: 4.1 g/dL

## 2014-12-07 MED ORDER — TRAZODONE HCL 50 MG PO TABS
50.0000 mg | ORAL_TABLET | Freq: Every evening | ORAL | Status: DC | PRN
  Administered 2014-12-07 (×2): 50 mg via ORAL
  Filled 2014-12-07 (×2): qty 1

## 2014-12-07 MED ORDER — CETYLPYRIDINIUM CHLORIDE 0.05 % MT LIQD
7.0000 mL | Freq: Two times a day (BID) | OROMUCOSAL | Status: DC
  Administered 2014-12-07 – 2014-12-08 (×3): 7 mL via OROMUCOSAL

## 2014-12-07 MED ORDER — IPRATROPIUM BROMIDE 0.02 % IN SOLN: 0.5000 mg | RESPIRATORY_TRACT | Status: DC | PRN

## 2014-12-07 NOTE — Procedures (Signed)
US guided diagnostic/therapeutic right thoracentesis performed yielding 1.6 liters turbid, amber fluid. The fluid was sent to the lab for preordered studies. F/u CXR pending. No immediate complications.

## 2014-12-07 NOTE — Progress Notes (Addendum)
Patient ID: Jackquline Berlin, female   DOB: 1956-05-30, 59 y.o.   MRN: [id number] [facility] PROGRESS NOTE  JACEE ENERSON WLN:[id number] DOB: Jan 27, 1956 DOA: 12/06/2014 PCP: Dr. Esperanza Richters  Brief narrative:    59 y.o. Female with past medical history of COPD, on home oxygen, recurrent non small cell lung cancer, on chemoradiation, status post 8 cycles, last dose 11/2012 with partial response, subsequent consolidation chemotherapy with carboplatin and paclitaxel, status post subxiphoid pericardial window under Dr. Suzie Portela on 01/04/2014 with pathology showing no evidence of malignancy, then systemic chemotherapy with carboplatin and alimta which was started in 08/2014 but stopped due to intolerance and now on immunotherapy with nivolumab every 2 weeks (frist dose 11/28/2014).  Pt presented to [facility] ED with worsening shortness of breath and fatigue. On admission, she was found to have reduced aeration in right lung, worse compared with 6/21 study in addition to opacity in left mid lung lobe possibly inflammatory or infectious in etiology. She was also found to leukocytosis of 17.9. She was started on empiric zosyn and admitted for further evaluation.   Barrier to discharge: will have thoracentesis today. Anticipate home by 12/09/2014.   Assessment/Plan:    Principal Problem: Sepsis due to possible lobar pneumonia, unspecified organism / leukocytosis - Sepsis criteria met on admission with tachycardia, tachypnea, hypoxia and source of infection thought to be pneumonia. CXR on admission showed reduced aeration in right lung, worse compared with 6/21 study in addition to opacity in left mid lung lobe possibly inflammatory or infectious in etiology. - Pt started on zosyn. - Blood cultures not obtained at the time of the admission to guide abx treatment  - Monitor CBC daily - Since she is already on abx will not collect blood cultures now unless she spikes fever   Active Problems: Acute  respiratory failure with hypoxia / COPD exacerbation / right pleural effusion - Hypoxia likely from combination of COPD, pleural effusion on the right and lung cancer - Pt is status post thoracentesis with fluid sent for analysis - Continue atrovent and xopenex scheduled and as needed for shortness of breath or wheezing   Non-small cell lung cancer - Currently on immunotherapy, started 11/28/14; given every 2 weeks   Hypokalemia - From lasix - Supplemented - Follow up BMP tomorrow am   Chronic diastolic dysfunction - Last 2 D ECHO in 11/2014 with grade 1 diastolic dysfunction and preserved EF - Compensated - Continue lasix , losartan, metoprolol, digoxin, spironolactone  Essential hypertension - Continue metoprolol, losartan, lasix   Metabolic alkalosis - Due to lasix   Anxiety and depression - Continue xanax and amitriptyline    DVT Prophylaxis  - SCD's bilaterally    Code Status: DNR/DNI Family Communication:  plan of care discussed with the patient and her husband at the bedside  Disposition Plan: Home once respiratory status improves. .   IV access:  Peripheral IV  Procedures and diagnostic studies:    Dg Chest 1 View 12/07/2014 Significant decrease in RIGHT pleural effusion post thoracentesis with improved aeration of RIGHT lung.  Chronic volume loss in opacification of RIGHT upper lobe compatible with history of neoplasm and radiation.  Underlying bullous COPD with improved atelectasis in LEFT mid lung.   Electronically Signed   By: Lavonia Dana M.D.   On: 12/07/2014 11:08   Dg Chest 2 View 12/06/2014  1. Considerably reduced aeration in the right lung with only a small amount of right upper lobe and right middle lobe still aerated. This  represents a significant worsening from 11/26/2014 with regard to the amount of aeration. There shift of the trachea and mediastinal structures to the right indicating atelectasis. 2. Markedly severe emphysema. 3. Indistinct linear nodular  opacity in the left mid lung is nonspecific and could be inflammatory or infectious. This is less likely to be malignant given that it was not present 10 days ago.   Electronically Signed   By: Van Clines M.D.   On: 12/06/2014 16:47   Thoracentesis 12/07/2014    Medical Consultants:  Interventional radiology  Other Consultants:  None   IAnti-Infectives:   Zosyn 12/06/2014 -->    Leisa Lenz, MD  [facility] Pager [phone]  Time spent in minutes: 25 minutes  If 7PM-7AM, please contact night-coverage [email or web address] Password [facility] 12/07/2014, 11:33 AM   LOS: 1 day    HPI/Subjective: No acute overnight events. Patient reports having shortness of breath.   Objective: Filed Vitals:   12/07/14 0823 12/07/14 1006 12/07/14 1020 12/07/14 1100  BP:  124/74 102/58 102/64  Pulse:    117  Temp:    97.5 F (36.4 C)  TempSrc:    Oral  Resp:    20  Height:      Weight:      SpO2: 91%  96% 100%    Intake/Output Summary (Last 24 hours) at 12/07/14 1133 Last data filed at 12/07/14 1100  Gross per 24 hour  Intake    340 ml  Output      0 ml  Net    340 ml    Exam:   General:  Pt is alert, follows commands appropriately, not in acute distress  Cardiovascular: Regular rate and rhythm, S1/S2 (+)  Respiratory: rhonchi throughout the both lung lobes, wheezing appreciated   Abdomen: Soft, non tender, non distended, bowel sounds present  Extremities: trace pedal edmea, pulses DP and PT palpable bilaterally  Neuro: Grossly nonfocal  Data Reviewed: Basic Metabolic Panel:  Recent Labs Lab 12/06/14 1338 12/06/14 1638 12/07/14 0408  NA 137 131* 132*  K 3.8 3.5 3.1*  CL  --  81* 80*  CO2 41 Repeated and Verified*  --  42*  GLUCOSE 181* 133* 195*  BUN 9._0 CREATININE 0.6 0.60 0.46  CALCIUM 10.4  --  9.2   Liver Function Tests:  Recent Labs Lab 12/06/14 1338 12/07/14 0408  AST 15 18  ALT 16 12*  ALKPHOS 87 70  BILITOT 0.37 0.4  PROT 7.1 6.3*   ALBUMIN 3.0* 2.9*   No results for input(s): LIPASE, AMYLASE in the last 168 hours. No results for input(s): AMMONIA in the last 168 hours. CBC:  Recent Labs Lab 12/06/14 1338 12/06/14 1638 12/07/14 0408  WBC 17.9*  --  19.0*  NEUTROABS 15.2*  --  17.1*  HGB 13.4 14.3 12.1  HCT 42.3 42.0 38.7  MCV 101.7*  --  100.3*  PLT 425*  --  432*   Cardiac Enzymes: No results for input(s): CKTOTAL, CKMB, CKMBINDEX, TROPONINI in the last 168 hours. BNP: Invalid input(s): POCBNP CBG: No results for input(s): GLUCAP in the last 168 hours.  No results found for this or any previous visit (from the past 240 hour(s)).   Scheduled Meds: . ALPRAZolam  1 mg Oral TID  . amitriptyline  50 mg Oral Daily  . benzonatate  200 mg Oral Daily  . digoxin  0.125 mg Oral Daily  . docusate sodium  100 mg Oral BID  . furosemide  40 mg  Oral Daily  . ipratropium  0.5 mg Nebulization QID  . levalbuterol  0.63 mg Nebulization QID  . losartan  12.5 mg Oral Daily  . metoprolol succinate  25 mg Oral Daily  . piperacillin-tazobactam (ZOSYN)  IV  3.375 g Intravenous 3 times per day  . potassium chloride SA  20 mEq Oral Daily  . predniSONE  10 mg Oral Q breakfast  . senna-docusate  1 tablet Oral QHS  . spironolactone  12.5 mg Oral Daily  . sucralfate  1 g Oral TID WC & HS

## 2014-12-07 NOTE — Assessment & Plan Note (Signed)
Pt c/o extreme fatigue for last 2 days.  She reports that she has been too tired to ambulate, take her meds, or care for herself.  She denies any recent nausea/vomiting, diarrhea/constipation, or fever/chills.  She has been dx'd with both COPD and CHF.  She is on a 1 quart per day fluid restriction.   She denies any increased SOB or increased cough/wheeze.  She is chronically on 4 LNC. O2 sats down to 90% on 4 LNC today.   ON exam- pt with diminished breath sounds bilat; but no acute SOB or distress.  Pt noted to have mottling to bilat lower extremities from knees down. All extremities cool to touch.   Labs drawn reveal CO2 at 41.    Pt will be transported to ED for further eval and management.  Brief hx and report was called to ED charge RN prior to transporting pt to ED via wheelchair per cancer center RN .

## 2014-12-07 NOTE — Progress Notes (Signed)
SYMPTOM MANAGEMENT CLINIC   HPI: Linda Donovan 59 y.o. female diagnosed with lung cancer.  Pt sp chemotherapy and radiation treatments. Currently undergoing Nivolumab immunotherapy.   Pt c/o extreme fatigue for last 2 days.  She reports that she has been too tired to ambulate, take her meds, or care for herself.  She denies any recent nausea/vomiting, diarrhea/constipation, or fever/chills.  She has been dx'd with both COPD and CHF.  She is on a 1 quart per day fluid restriction.   She denies any increased SOB or increased cough/wheeze.  She is chronically on 4 LNC. O2 sats down to 90% on 4 LNC today.   HPI  ROS  Past Medical History  Diagnosis Date  . COPD (chronic obstructive pulmonary disease)   . ADD (attention deficit disorder)   . Hx of radiation therapy 09/28/12- 11/17/12    RU lung mass, mediastinum chest, 63 gray 35 fx  . Pneumonia   . Arthritis   . Non-small cell lung cancer 09/18/2012    RUL  . Asthma     Past Surgical History  Procedure Laterality Date  . Total abdominal hysterectomy    . Shoulder arthroscopy    . Video bronchoscopy Bilateral 07/25/2012    Procedure: VIDEO BRONCHOSCOPY WITH FLUORO;  Surgeon: Tanda Rockers, MD;  Location: [facility];  Service: Endoscopy;  Laterality: Bilateral;  . Lump removed from breasr right  2003  . Endobronchial ultrasound Bilateral 09/04/2012    Procedure: ENDOBRONCHIAL ULTRASOUND;  Surgeon: Collene Gobble, MD;  Location: [facility];  Service: Cardiopulmonary;  Laterality: Bilateral;  . Breast surgery    . Subxyphoid pericardial window N/A 01/04/2014    Procedure: SUBXYPHOID PERICARDIAL WINDOW;  Surgeon: [name], MD;  Location: [facility];  Service: Thoracic;  Laterality: N/A;  . Intraoperative transesophageal echocardiogram N/A 01/04/2014    Procedure: INTRAOPERATIVE TRANSESOPHAGEAL ECHOCARDIOGRAM;  Surgeon: [name], MD;  Location: [facility];  Service: Open Heart Surgery;  Laterality: N/A;    has COPD   GOLD III; Rhinitis; Non-small cell lung cancer; On antineoplastic chemotherapy; Pericardial effusion with cardiac tamponade; SOB (shortness of breath); Sinus tachycardia; Pericardial effusion without cardiac tamponade; Right upper lobe consolidation; Acute on chronic respiratory failure with hypoxia; Dyspnea; Elevated troponin; DCM (dilated cardiomyopathy); Acute systolic CHF (congestive heart failure); Left ventricular dysfunction with reduced left ventricular function; DNR (do not resuscitate) discussion; Adjustment disorder with mixed anxiety and depressed mood; Palliative care encounter; Chronic systolic heart failure; Pleural effusion; and Hypercapnia on her problem list.    is allergic to clinoril and morphine.    Medication List       This list is accurate as of: 12/06/14  3:27 PM.  Always use your most recent med list.               ALPRAZolam 1 MG tablet  Commonly known as:  XANAX  Take 1 tablet (1 mg total) by mouth 3 (three) times daily.     amitriptyline 50 MG tablet  Commonly known as:  ELAVIL  Take 50 mg by mouth daily.     aspirin 81 MG tablet  Take 81 mg by mouth daily.     benzonatate 200 MG capsule  Commonly known as:  TESSALON  Take 200 mg by mouth daily.     budesonide-formoterol 160-4.5 MCG/ACT inhaler  Commonly known as:  SYMBICORT  Inhale 2 puffs into the lungs 2 (two) times daily.     dextromethorphan-guaiFENesin 30-600 MG per 12 hr tablet  Commonly known as:  MUCINEX DM  Take 1 tablet by mouth 2 (two) times daily as needed for cough.     digoxin 0.125 MG tablet  Commonly known as:  LANOXIN  Take 1 tablet (0.125 mg total) by mouth daily.     docusate sodium 100 MG capsule  Commonly known as:  COLACE  Take 1 capsule (100 mg total) by mouth 2 (two) times daily.     folic acid 1 MG tablet  Commonly known as:  FOLVITE  Take 1 tablet (1 mg total) by mouth daily.     furosemide 40 MG tablet  Commonly known as:  LASIX  Take 1 tablet (40 mg total) by  mouth daily.     HYDROcodone-homatropine 5-1.5 MG/5ML syrup  Commonly known as:  HYCODAN  Take 5 mLs by mouth every 6 (six) hours as needed for cough.     ipratropium 0.02 % nebulizer solution  Commonly known as:  ATROVENT  Take 2.5 mLs (0.5 mg total) by nebulization 4 (four) times daily.     levalbuterol 0.63 MG/3ML nebulizer solution  Commonly known as:  XOPENEX  Take 3 mLs (0.63 mg total) by nebulization 4 (four) times daily.     levalbuterol 45 MCG/ACT inhaler  Commonly known as:  XOPENEX HFA  Inhale 2 puffs into the lungs every 4 (four) hours as needed for wheezing.     losartan 25 MG tablet  Commonly known as:  COZAAR  Take 0.5 tablets (12.5 mg total) by mouth daily.     metoprolol succinate 25 MG 24 hr tablet  Commonly known as:  TOPROL-XL  Take 25 mg by mouth daily.     oxyCODONE-acetaminophen 5-325 MG per tablet  Commonly known as:  PERCOCET/ROXICET  Take 1 tablet by mouth every 4 (four) hours as needed for severe pain.     OXYGEN  Inhale 4 L into the lungs continuous.     polyethylene glycol packet  Commonly known as:  MIRALAX / GLYCOLAX  Take 17 g by mouth daily as needed for mild constipation or moderate constipation.     potassium chloride SA 20 MEQ tablet  Commonly known as:  K-DUR,KLOR-CON  TAKE 1 TABLET BY MOUTH EVERY DAY     predniSONE 10 MG tablet  Commonly known as:  DELTASONE  Take 10 mg by mouth daily.     prochlorperazine 10 MG tablet  Commonly known as:  COMPAZINE  Take 1 tablet (10 mg total) by mouth every 6 (six) hours as needed for nausea or vomiting.     SENOKOT S 8.6-50 MG per tablet  Generic drug:  senna-docusate  Take 1 tablet by mouth at bedtime.     spironolactone 25 MG tablet  Commonly known as:  ALDACTONE  Take 0.5 tablets (12.5 mg total) by mouth daily.     sucralfate 1 G tablet  Commonly known as:  CARAFATE  Take 1 tablet (1 g total) by mouth 4 (four) times daily -  with meals and at bedtime. Dissolve in 10 ml of water  prior to taking     traZODone 50 MG tablet  Commonly known as:  DESYREL  Take 50 mg by mouth at bedtime as needed for sleep.         PHYSICAL EXAMINATION  Oncology Vitals 12/07/2014 12/07/2014 12/07/2014 12/07/2014 12/07/2014 12/07/2014 12/07/2014  Height - - - - - - -  Weight - - - - - - -  Weight (lbs) - - - - - - -  BMI (kg/m2) - - - - - - -  Temp 98.3 - 97.5 - - 97.6 98  Pulse 101 - 117 - - 110 114  Resp 20 - 20 - - 20 22  SpO2 100 100 100 96 91 94 95  BSA (m2) - - - - - - -   BP Readings from Last 3 Encounters:  12/07/14 109/70  12/06/14 132/77  12/04/14 105/72    Physical Exam  Constitutional: She is oriented to person, place, and time.  Appears very fatigued, weak, frail, chronically ill   HENT:  Head: Normocephalic and atraumatic.  Mouth/Throat: Oropharynx is clear and moist.  Eyes: Conjunctivae and EOM are normal. Pupils are equal, round, and reactive to light. Right eye exhibits no discharge. Left eye exhibits no discharge. No scleral icterus.  Neck: Normal range of motion. Neck supple. No JVD present. No tracheal deviation present. No thyromegaly present.  Cardiovascular: Normal rate, regular rhythm, normal heart sounds and intact distal pulses.   Pulmonary/Chest: Effort normal. No stridor. She has no wheezes. She has no rales. She exhibits no tenderness.  Diminished bases.    Abdominal: Soft. Bowel sounds are normal. She exhibits no distension and no mass. There is no tenderness. There is no rebound and no guarding.  Musculoskeletal: Normal range of motion. She exhibits no edema or tenderness.  Lymphadenopathy:    She has no cervical adenopathy.  Neurological: She is alert and oriented to person, place, and time.  In wheelchair.   Skin: Skin is warm and dry. No rash noted. No erythema. There is pallor.  Psychiatric: Affect normal.  Nursing note and vitals reviewed.   LABORATORY DATA:. Admission on 12/06/2014  Component Date Value Ref Range Status  . Sodium  12/06/2014 131* 135 - 145 mmol/L Final  . Potassium 12/06/2014 3.5  3.5 - 5.1 mmol/L Final  . Chloride 12/06/2014 81* 101 - 111 mmol/L Final  . BUN 12/06/2014 9  6 - 20 mg/dL Final  . Creatinine, Ser 12/06/2014 0.60  0.44 - 1.00 mg/dL Final  . Glucose, Bld 12/06/2014 133* 65 - 99 mg/dL Final  . Calcium, Ion 12/06/2014 1.08* 1.13 - 1.30 mmol/L Final  . TCO2 12/06/2014 44  0 - 100 mmol/L Final  . Hemoglobin 12/06/2014 14.3  12.0 - 15.0 g/dL Final  . HCT 12/06/2014 42.0  36.0 - 46.0 % Final  . Color, Urine 12/06/2014 AMBER* YELLOW Final   BIOCHEMICALS MAY BE AFFECTED BY COLOR  . APPearance 12/06/2014 CLOUDY* CLEAR Final  . Specific Gravity, Urine 12/06/2014 1.023  1.005 - 1.030 Final  . pH 12/06/2014 6.0  5.0 - 8.0 Final  . Glucose, UA 12/06/2014 NEGATIVE  NEGATIVE mg/dL Final  . Hgb urine dipstick 12/06/2014 NEGATIVE  NEGATIVE Final  . Bilirubin Urine 12/06/2014 SMALL* NEGATIVE Final  . Ketones, ur 12/06/2014 >80* NEGATIVE mg/dL Final  . Protein, ur 12/06/2014 NEGATIVE  NEGATIVE mg/dL Final  . Urobilinogen, UA 12/06/2014 1.0  0.0 - 1.0 mg/dL Final  . Nitrite 12/06/2014 NEGATIVE  NEGATIVE Final  . Leukocytes, UA 12/06/2014 SMALL* NEGATIVE Final  . O2 Content 12/06/2014 2.0   Final  . pH, Arterial 12/06/2014 7.456* 7.350 - 7.450 Final  . pCO2 arterial 12/06/2014 65.9* 35.0 - 45.0 mmHg Final   Comment: CRITICAL RESULT CALLED TO, READ BACK BY AND VERIFIED WITH: CALLLEISA,TAPIA BY ANGIE DUNLAP RRT RCP ON 12/06/14 AT 1816   . pO2, Arterial 12/06/2014 61.8* 80.0 - 100.0 mmHg Final  . Bicarbonate 12/06/2014 45.8* 20.0 - 24.0 mEq/L Final  .  TCO2 12/06/2014 40.7  0 - 100 mmol/L Final  . Acid-Base Excess 12/06/2014 18.3* 0.0 - 2.0 mmol/L Final  . O2 Saturation 12/06/2014 91.8   Final  . Patient temperature 12/06/2014 98.6   Final  . Collection site 12/06/2014 LEFT RADIAL   Final  . Drawn by 12/06/2014 COLLECTED BY RT   Final  . Sample type 12/06/2014 ARTERIAL DRAW   Final  . Allens test  (pass/fail) 12/06/2014 PASS  PASS Final  . Squamous Epithelial / LPF 12/06/2014 MANY* RARE Final  . WBC, UA 12/06/2014 3-6  <3 WBC/hpf Final  . Bacteria, UA 12/06/2014 MANY* RARE Final  . Casts 12/06/2014 HYALINE CASTS* NEGATIVE Final  . Crystals 12/06/2014 CA OXALATE CRYSTALS* NEGATIVE Final  . Urine-Other 12/06/2014 MUCOUS PRESENT   Final  . Digoxin Level 12/06/2014 0.2* 0.8 - 2.0 ng/mL Final  . Specimen Description 12/07/2014 SPUTUM   Final  . Special Requests 12/07/2014 Immunocompromised   Final  . Sputum evaluation 12/07/2014 THIS SPECIMEN IS ACCEPTABLE. RESPIRATORY CULTURE REPORT TO FOLLOW.   Final  . Report Status 12/07/2014 12/07/2014 FINAL   Final  . Sodium 12/07/2014 132* 135 - 145 mmol/L Final  . Potassium 12/07/2014 3.1* 3.5 - 5.1 mmol/L Final  . Chloride 12/07/2014 80* 101 - 111 mmol/L Final  . CO2 12/07/2014 42* 22 - 32 mmol/L Final  . Glucose, Bld 12/07/2014 195* 65 - 99 mg/dL Final  . BUN 12/07/2014 12  6 - 20 mg/dL Final  . Creatinine, Ser 12/07/2014 0.46  0.44 - 1.00 mg/dL Final  . Calcium 12/07/2014 9.2  8.9 - 10.3 mg/dL Final  . Total Protein 12/07/2014 6.3* 6.5 - 8.1 g/dL Final  . Albumin 12/07/2014 2.9* 3.5 - 5.0 g/dL Final  . AST 12/07/2014 18  15 - 41 U/L Final  . ALT 12/07/2014 12* 14 - 54 U/L Final  . Alkaline Phosphatase 12/07/2014 70  38 - 126 U/L Final  . Total Bilirubin 12/07/2014 0.4  0.3 - 1.2 mg/dL Final  . GFR calc non Af Amer 12/07/2014 >60  >60 mL/min Final  . GFR calc Af Amer 12/07/2014 >60  >60 mL/min Final   Comment: (NOTE) The eGFR has been calculated using the CKD EPI equation. This calculation has not been validated in all clinical situations. eGFR's persistently <60 mL/min signify possible Chronic Kidney Disease.   . Anion gap 12/07/2014 10  5 - 15 Final  . WBC 12/07/2014 19.0* 4.0 - 10.5 K/uL Final  . RBC 12/07/2014 3.86* 3.87 - 5.11 MIL/uL Final  . Hemoglobin 12/07/2014 12.1  12.0 - 15.0 g/dL Final  . HCT 12/07/2014 38.7  36.0 - 46.0  % Final  . MCV 12/07/2014 100.3* 78.0 - 100.0 fL Final  . MCH 12/07/2014 31.3  26.0 - 34.0 pg Final  . MCHC 12/07/2014 31.3  30.0 - 36.0 g/dL Final  . RDW 12/07/2014 12.5  11.5 - 15.5 % Final  . Platelets 12/07/2014 432* 150 - 400 K/uL Final  . Neutrophils Relative % 12/07/2014 90* 43 - 77 % Final  . Neutro Abs 12/07/2014 17.1* 1.7 - 7.7 K/uL Final  . Lymphocytes Relative 12/07/2014 5* 12 - 46 % Final  . Lymphs Abs 12/07/2014 0.9  0.7 - 4.0 K/uL Final  . Monocytes Relative 12/07/2014 4  3 - 12 % Final  . Monocytes Absolute 12/07/2014 0.8  0.1 - 1.0 K/uL Final  . Eosinophils Relative 12/07/2014 1  0 - 5 % Final  . Eosinophils Absolute 12/07/2014 0.2  0.0 - 0.7 K/uL  Final  . Basophils Relative 12/07/2014 0  0 - 1 % Final  . Basophils Absolute 12/07/2014 0.0  0.0 - 0.1 K/uL Final  . Total protein, fluid 12/07/2014 4.1   Final   Comment: (NOTE) No normal range established for this test Results should be evaluated in conjunction with serum values Performed at [facility]   . Fluid Type-FTP 12/07/2014 PLEURAL   Corrected   Comment: RIGHT CORRECTED ON 07/02 AT 1052: PREVIOUSLY REPORTED AS Pleural R   . Fluid Type-FCT 12/07/2014 PLEURAL   Corrected   CORRECTED ON 07/02 AT 1052: PREVIOUSLY REPORTED AS Pleural R  . Color, Fluid 12/07/2014 YELLOW  YELLOW Final  . Appearance, Fluid 12/07/2014 HAZY* CLEAR Final  . WBC, Fluid 12/07/2014 SPECIMEN CLOTTED  0 - 1000 cu mm Final  . Neutrophil Count, Fluid 12/07/2014 13  0 - 25 % Final  . Lymphs, Fluid 12/07/2014 81   Final  . Monocyte-Macrophage-Serous Fluid 12/07/2014 4* 50 - 90 % Final  . Eos, Fluid 12/07/2014 3   Final  . Other Cells, Fluid 12/07/2014 NONE   Final  . Glucose, Fluid 12/07/2014 158   Final   Comment: (NOTE) No normal range established for this test Results should be evaluated in conjunction with serum values Performed at [facility]   . Fluid Type-FGLU 12/07/2014 PLEURAL   Corrected   Comment:  RIGHT CORRECTED ON 07/02 AT 1053: PREVIOUSLY REPORTED AS Pleural [facility] Outpatient Visit on 12/06/2014  Component Date Value Ref Range Status  . B Natriuretic Peptide 12/06/2014 113.9* 0.0 - 100.0 pg/mL Final  Appointment on 12/06/2014  Component Date Value Ref Range Status  . WBC 12/06/2014 17.9* 3.9 - 10.3 10e3/uL Final  . NEUT# 12/06/2014 15.2* 1.5 - 6.5 10e3/uL Final  . HGB 12/06/2014 13.4  11.6 - 15.9 g/dL Final  . HCT 12/06/2014 42.3  34.8 - 46.6 % Final  . Platelets 12/06/2014 425* 145 - 400 10e3/uL Final  . MCV 12/06/2014 101.7* 79.5 - 101.0 fL Final  . MCH 12/06/2014 32.2  25.1 - 34.0 pg Final  . MCHC 12/06/2014 31.7  31.5 - 36.0 g/dL Final  . RBC 12/06/2014 4.16  3.70 - 5.45 10e6/uL Final  . RDW 12/06/2014 12.6  11.2 - 14.5 % Final  . lymph# 12/06/2014 0.7* 0.9 - 3.3 10e3/uL Final  . MONO# 12/06/2014 1.7* 0.1 - 0.9 10e3/uL Final  . Eosinophils Absolute 12/06/2014 0.2  0.0 - 0.5 10e3/uL Final  . Basophils Absolute 12/06/2014 0.0  0.0 - 0.1 10e3/uL Final  . NEUT% 12/06/2014 84.9* 38.4 - 76.8 % Final  . LYMPH% 12/06/2014 4.1* 14.0 - 49.7 % Final  . MONO% 12/06/2014 9.5  0.0 - 14.0 % Final  . EOS% 12/06/2014 1.3  0.0 - 7.0 % Final  . BASO% 12/06/2014 0.2  0.0 - 2.0 % Final  . Sodium 12/06/2014 137  136 - 145 mEq/L Final  . Potassium 12/06/2014 3.8  3.5 - 5.1 mEq/L Final  . Chloride 12/06/2014 82* 98 - 109 mEq/L Final  . CO2 12/06/2014 41 Repeated and Verified* 22 - 29 mEq/L Final  . Glucose 12/06/2014 181* 70 - 140 mg/dl Final  . BUN 12/06/2014 9.9  7.0 - 26.0 mg/dL Final  . Creatinine 12/06/2014 0.6  0.6 - 1.1 mg/dL Final  . Total Bilirubin 12/06/2014 0.37  0.20 - 1.20 mg/dL Final  . Alkaline Phosphatase 12/06/2014 87  40 - 150 U/L Final  . AST 12/06/2014 15  5 - 34 U/L Final  .  ALT 12/06/2014 16  0 - 55 U/L Final  . Total Protein 12/06/2014 7.1  6.4 - 8.3 g/dL Final  . Albumin 12/06/2014 3.0* 3.5 - 5.0 g/dL Final  . Calcium 12/06/2014 10.4  8.4 - 10.4 mg/dL Final   . Anion Gap 12/06/2014 14* 3 - 11 mEq/L Final  . EGFR 12/06/2014 >90  >90 ml/min/1.73 m2 Final   eGFR is calculated using the CKD-EPI Creatinine Equation (2009)     RADIOGRAPHIC STUDIES: Dg Chest 1 View  12/07/2014   CLINICAL DATA:  Post RIGHT thoracentesis, history RIGHT upper lobe non-small-cell lung cancer post radiation therapy, COPD, former smoker, asthma  EXAM: CHEST  1 VIEW  COMPARISON:  12/06/2014  FINDINGS: Significant decrease in RIGHT pleural effusion post thoracentesis.  No pneumothorax.  Volume loss, scarring and opacity of the RIGHT upper lobe again identified compatible with history of neoplasm and radiation.  Underlying emphysematous changes.  Improved atelectasis LEFT mid lung.  Significant bullous disease LEFT upper lobe.  No new infiltrate identified.  Cysts  IMPRESSION: Significant decrease in RIGHT pleural effusion post thoracentesis with improved aeration of RIGHT lung.  Chronic volume loss in opacification of RIGHT upper lobe compatible with history of neoplasm and radiation.  Underlying bullous COPD with improved atelectasis in LEFT mid lung.   Electronically Signed   By: Lavonia Dana M.D.   On: 12/07/2014 11:08   Dg Chest 2 View  12/06/2014   CLINICAL DATA:  Fatigue.  Right upper lobe lung cancer.  EXAM: CHEST  2 VIEW  COMPARISON:  11/26/2014  FINDINGS: Further reduction in aeration in the right lung, with a small amount of aerated upper lobe and a small amount of aerated right middle lobe. Underlying pleural effusion suspected with extensive atelectasis. There is some shift of cardiac and mediastinal structures to the right as well as rightward tracheal shaft.  Severe emphysema. Left mid lung linear and nodular opacity, indistinct, new.  Thoracic spondylosis.  IMPRESSION: 1. Considerably reduced aeration in the right lung with only a small amount of right upper lobe and right middle lobe still aerated. This represents a significant worsening from 11/26/2014 with regard to the  amount of aeration. There shift of the trachea and mediastinal structures to the right indicating atelectasis. 2. Markedly severe emphysema. 3. Indistinct linear nodular opacity in the left mid lung is nonspecific and could be inflammatory or infectious. This is less likely to be malignant given that it was not present 10 days ago.   Electronically Signed   By: Van Clines M.D.   On: 12/06/2014 16:47   US Thoracentesis Asp Pleural Space W/img Guide  12/07/2014   INDICATION: Right lung cancer, dyspnea, right pleural effusion. Request is made for diagnostic and therapeutic right thoracentesis.  EXAM: ULTRASOUND GUIDED DIAGNOSTIC AND THERAPEUTIC RIGHT THORACENTESIS  COMPARISON:  None.  MEDICATIONS: None  COMPLICATIONS: None immediate  TECHNIQUE: Informed written consent was obtained from the patient after a discussion of the risks, benefits and alternatives to treatment. A timeout was performed prior to the initiation of the procedure.  Initial ultrasound scanning demonstrates a moderate to large right pleural effusion. The lower chest was prepped and draped in the usual sterile fashion. 1% lidocaine was used for local anesthesia.  An ultrasound image was saved for documentation purposes. A 6 Fr Safe-T-Centesis catheter was introduced. The thoracentesis was performed. The catheter was removed and a dressing was applied. The patient tolerated the procedure well without immediate post procedural complication. The patient was escorted to have an  upright chest radiograph.  FINDINGS: A total of approximately 1.6 liters of turbid, amber fluid was removed. Requested samples were sent to the laboratory.  IMPRESSION: Successful ultrasound-guided diagnostic and therapeutic right sided thoracentesis yielding 1.6 liters of pleural fluid.  Read by: Rowe Robert, PA-C   Electronically Signed   By: Lucrezia Europe M.D.   On: 12/07/2014 11:33    ASSESSMENT/PLAN:    Hypercapnia Pt c/o extreme fatigue for last 2 days.  She  reports that she has been too tired to ambulate, take her meds, or care for herself.  She denies any recent nausea/vomiting, diarrhea/constipation, or fever/chills.  She has been dx'd with both COPD and CHF.  She is on a 1 quart per day fluid restriction.   She denies any increased SOB or increased cough/wheeze.  She is chronically on 4 LNC. O2 sats down to 90% on 4 LNC today.   ON exam- pt with diminished breath sounds bilat; but no acute SOB or distress.  Pt noted to have mottling to bilat lower extremities from knees down. All extremities cool to touch.   Labs drawn reveal CO2 at 41.    Pt will be transported to ED for further eval and management.  Brief hx and report was called to ED charge RN prior to transporting pt to ED via wheelchair per cancer center RN .    Non-small cell lung cancer Pt last received chemo with carboplatin/gemcitabine March 2016.  She received her first cycle of Nivolumab immunotherapy on 11/28/14.   Pt is scheduled for her next labs and Nivolumab on 12/12/14.    Patient stated understanding of all instructions; and was in agreement with this plan of care. The patient knows to call the clinic with any problems, questions or concerns.   Review/collaboration with Dr. Marin Olp (on call for Dr. Julien Nordmann)  regarding all aspects of patient's visit today.   Total time spent with patient was 40 minutes;  with greater than 75 percent of that time spent in face to face counseling regarding patient's symptoms,  and coordination of care and follow up.  Disclaimer: This note was dictated with voice recognition software. Similar sounding words can inadvertently be transcribed and may not be corrected upon review.   Drue Second, NP 12/07/2014

## 2014-12-07 NOTE — Assessment & Plan Note (Addendum)
Pt last received chemo with carboplatin/gemcitabine March 2016.  She received her first cycle of Nivolumab immunotherapy on 11/28/14.   Pt is scheduled for her next labs and Nivolumab on 12/12/14.

## 2014-12-07 NOTE — Progress Notes (Signed)
Protocol: Abx to be renally adjusted  Pt is currently on Zosyn 3.375 GM IV Q8h (over 4 hour infusion) for empiric therapy for PNA. Pharmacy to adjust antibiotic per renal function. Clcr=62 so we will continue the same regimen. We will sign off.  Thanks, Garnet Sierras, PharmD. 12/07/2014

## 2014-12-08 DIAGNOSIS — J9621 Acute and chronic respiratory failure with hypoxia: Secondary | ICD-10-CM

## 2014-12-08 DIAGNOSIS — J441 Chronic obstructive pulmonary disease with (acute) exacerbation: Secondary | ICD-10-CM

## 2014-12-08 DIAGNOSIS — C3490 Malignant neoplasm of unspecified part of unspecified bronchus or lung: Secondary | ICD-10-CM

## 2014-12-08 LAB — PH, BODY FLUID: PH, FLUID: 8.5

## 2014-12-08 LAB — STREP PNEUMONIAE URINARY ANTIGEN: Strep Pneumo Urinary Antigen: NEGATIVE

## 2014-12-08 MED ORDER — CIPROFLOXACIN HCL 500 MG PO TABS: 500.0000 mg | ORAL_TABLET | Freq: Two times a day (BID) | ORAL | Status: DC

## 2014-12-08 NOTE — Progress Notes (Signed)
Utilization Review Completed.Donne Anon T7/08/2014

## 2014-12-08 NOTE — Discharge Instructions (Signed)
Pleural Effusion °The lining covering your lungs and the inside of your chest is called the pleura. Usually, the space between the two pleura contains no air and only a thin layer of fluid. A pleural effusion is an abnormal buildup of fluid in the pleural space. °Fluid gathers when there is increased pressure in the lung vessels. This forces fluids out of the lungs and into the pleural space. Vessels may also leak fluids when there are infections, such as pneumonia, or other causes of soreness and redness (inflammation). Fluids leak into the lungs when protein in the blood is low or when certain vessels (lymphatics) are blocked. °Finding a pleural effusion is important because it is usually caused by another disease. In order to treat a pleural effusion, your health care provider needs to find its cause. If left untreated, a large amount of fluid can build up and cause collapse of the lung. °CAUSES  °· Heart failure. °· Infections (pneumonia, tuberculosis), pulmonary embolism, pulmonary infarction. °· Cancer (primary lung and metastatic), asbestosis. °· Liver failure (cirrhosis). °· Nephrotic syndrome, peritoneal dialysis, kidney problems (uremia). °· Collagen vascular disease (systemic lupus erythematosus, rheumatoid arthritis). °· Injury (trauma) to the chest or rupture of the digestive tube (esophagus). °· Material in the chest or pleural space (hemothorax, chylothorax). °· Pancreatitis. °· Surgery. °· Drug reactions. °SYMPTOMS  °A pleural effusion can decrease the amount of space available for breathing and make you short of breath. The fluid can become infected, which may cause pain and fever. Often, the pain is worse when taking a deep breath. The underlying disease (heart failure, pneumonia, blood clot, tuberculosis, cancer) may also cause symptoms. °DIAGNOSIS  °· Your health care provider can usually tell what is wrong by talking to you (taking a history), doing an exam, and taking a routine X-ray. If the  X-ray shows fluid in your chest, often fluid is removed from your chest with a needle for testing (diagnostic thoracentesis). °· Sometimes, more specialized X-rays may be needed. °· Sometimes, a small piece of tissue is removed and examined by a specialist (biopsy). °TREATMENT  °Treatment varies based on what caused the pleural effusion. Treatments include: °· Removing as much fluid as possible using a needle (thoracentesis) to improve the cough and shortness of breath. This is a simple procedure that can be done at bedside. The risks are bleeding, infection, collapse of a lung, or low blood pressure. °· Placing a tube in the chest to drain the effusion (tube thoracostomy). This is often used when there is an infection in the fluid. This is a simple procedure that can often be done at bedside or in a clinic. The procedure may be painful. The risks are the same as using a needle to drain the fluid. The chest tube usually remains for a few days and is connected to suction to improve fluid drainage. After placement, the tube usually does not cause much discomfort. °· Surgical removal of fibrous debris in and around the pleural space (decortication). This may be done with a flexible telescope (thoracoscope) through a small or large cut (incision). This is helpful for patients who have fibrosis or scar tissue that prevents complete lung expansion. The risks are infection, blood loss, and side effects from general anesthesia. °· Sometimes, a procedure called pleurodesis is done. A chest tube is placed and the fluid is drained. Next, an agent (tetracycline, talc powder) is added to the pleural space. This causes the lung and chest wall to stick together (adhesion). This leaves no   potential space for fluid to build up. The risks include infection, blood loss, and side effects from general anesthesia. °· If the effusion is caused by infection, it may be treated with antibiotics and may improve without draining. °HOME CARE  INSTRUCTIONS  °· Take any medicines exactly as prescribed. °· Follow up with your health care provider as directed. °· Monitor your exercise capacity (the amount of walking you can do before you get short of breath). °· Do not use any tobacco products including cigarettes, chewing tobacco, or electronic cigarettes. °SEEK MEDICAL CARE IF:  °· Your exercise capacity seems to get worse or does not improve with time. °· You do not recover from your illness. °· You have drainage, redness, swelling, or pain at any incision or puncture sites. °SEEK IMMEDIATE MEDICAL CARE IF:  °· Shortness of breath or chest pain develops or gets worse. °· You have a fever. °· You develop a new cough, especially if the mucus (phlegm) is discolored. °MAKE SURE YOU:  °· Understand these instructions. °· Will watch your condition. °· Will get help right away if you are not doing well or get worse. °Document Released: 05/24/2005 Document Revised: 10/08/2013 Document Reviewed: 01/13/2007 °[employer]® Patient Information ©2015 [employer]. This information is not intended to replace advice given to you by your health care provider. Make sure you discuss any questions you have with your health care provider. ° °

## 2014-12-08 NOTE — Discharge Summary (Signed)
Physician Discharge Summary  Linda Donovan ZDG:[id number] DOB: 1955/06/21 DOA: 12/06/2014  PCP: Dr. Esperanza Richters  Admit date: 12/06/2014 Discharge date: 12/08/2014  Recommendations for Outpatient Follow-up:  1. Patient will follow-up with oncology per scheduled appointment. 2. Patient will take ciprofloxacin for 7 days on discharge.  Discharge Diagnoses:  Active Problems:   COPD  GOLD III   Non-small cell lung cancer   Acute on chronic respiratory failure with hypoxia   DCM (dilated cardiomyopathy)   Chronic systolic heart failure   Pleural effusion    Discharge Condition: stable; patient wants to go home today.  Diet recommendation: as tolerated   History of present illness:  59 y.o. Female with past medical history of COPD, on home oxygen, recurrent non small cell lung cancer, on chemoradiation, status post 8 cycles, last dose 11/2012 with partial response, subsequent consolidation chemotherapy with carboplatin and paclitaxel, status post subxiphoid pericardial window under Dr. Suzie Portela on 01/04/2014 with pathology showing no evidence of malignancy, then systemic chemotherapy with carboplatin and alimta which was started in 08/2014 but stopped due to intolerance and now on immunotherapy with nivolumab every 2 weeks (frist dose 11/28/2014).  Pt presented to [facility] with worsening shortness of breath and fatigue. On admission, she was found to have reduced aeration in right lung, worse compared with 6/21 study in addition to opacity in left mid lung lobe possibly inflammatory or infectious in etiology. She was also found to leukocytosis of 17.9. She was started on empiric zosyn and admitted for further evaluation.  During this hospital stay, patient underwent thoracentesis 12/07/2014 with 1.6 L fluid removed and sent for analysis.  Hospital Course:    Assessment/Plan:    Principal Problem: Sepsis due to possible lobar pneumonia, unspecified organism / leukocytosis - Sepsis  criteria met on admission with tachycardia, tachypnea, hypoxia and source of infection thought to be pneumonia. CXR on admission showed reduced aeration in right lung, worse compared with 6/21 study in addition to opacity in left mid lung lobe possibly inflammatory or infectious in etiology. - Pt started on zosyn since the admission. We will discharge patient with ciprofloxacin for 7 days on discharge. - Blood cultures not obtained at the time of the admission to guide abx treatment  - Leukocytosis likely combination of pneumonia as well as prednisone  Active Problems: Acute respiratory failure with hypoxia / COPD exacerbation / right pleural effusion - Hypoxia likely secondary to combination of COPD, lung cancer, pleural effusion and as noted above pneumonia - Patient underwent thoracentesis 12/07/2014 with 1.6 L fluid drained and sent for analysis. Gram stain so far does not show any organisms. Cytology will be followed up on outpatient basis. - Patient can continue taking nebulizer treatments and inhalers as per prior home regimen.  Non-small cell lung cancer - Currently on immunotherapy, started 11/28/14; given every 2 weeks   Hypokalemia - From lasix - Supplemented - Patient will continue potassium supplementation as per prior home regimen.  Chronic diastolic dysfunction - Last 2 D ECHO in 11/2014 with grade 1 diastolic dysfunction and preserved EF - Compensated - Continue lasix , losartan, metoprolol, digoxin, spironolactone  Essential hypertension - Continue metoprolol, losartan, lasix   Metabolic alkalosis - Due to lasix   Anxiety and depression - Continue xanax and amitriptyline  - Stable.   DVT Prophylaxis  - SCD's bilaterally while patient in hospital   Code Status: DNR/DNI Family Communication: plan of care discussed with the patient and her husband at the bedside    IV  access:  Peripheral IV  Procedures and diagnostic studies:   Dg Chest 1 View  12/07/2014 Significant decrease in RIGHT pleural effusion post thoracentesis with improved aeration of RIGHT lung. Chronic volume loss in opacification of RIGHT upper lobe compatible with history of neoplasm and radiation. Underlying bullous COPD with improved atelectasis in LEFT mid lung. Electronically Signed By: Lavonia Dana M.D. On: 12/07/2014 11:08   Dg Chest 2 View 12/06/2014 1. Considerably reduced aeration in the right lung with only a small amount of right upper lobe and right middle lobe still aerated. This represents a significant worsening from 11/26/2014 with regard to the amount of aeration. There shift of the trachea and mediastinal structures to the right indicating atelectasis. 2. Markedly severe emphysema. 3. Indistinct linear nodular opacity in the left mid lung is nonspecific and could be inflammatory or infectious. This is less likely to be malignant given that it was not present 10 days ago. Electronically Signed By: Van Clines M.D. On: 12/06/2014 16:47   Thoracentesis 12/07/2014    Medical Consultants:  Interventional radiology  Other Consultants:  None   IAnti-Infectives:   Zosyn 12/06/2014 --> 12/08/2014   Signed:  Leisa Lenz, MD  [facility] 12/08/2014, 8:47 AM  Pager #: [phone]  Time spent in minutes: more than 30 minutes   Discharge Exam: Filed Vitals:   12/08/14 0519  BP: 101/56  Pulse: 115  Temp: 98 F (36.7 C)  Resp: 20   Filed Vitals:   12/07/14 2050 12/08/14 0123 12/08/14 0519 12/08/14 0730  BP: 96/71 114/72 101/56   Pulse: 107 109 115   Temp: 98 F (36.7 C)  98 F (36.7 C)   TempSrc: Oral  Oral   Resp: _0 Height:      Weight:      SpO2: 99% 94% 97% 93%    General: Pt is alert, follows commands appropriately, not in acute distress Cardiovascular: Regular rate and rhythm, S1/S2 +, no murmurs Respiratory: Coarse breath sounds appreciated throughout, no wheezing Abdominal: Soft, non tender,  non distended, bowel sounds +, no guarding Extremities: no edema, no cyanosis, pulses palpable bilaterally DP and PT Neuro: Grossly nonfocal  Discharge Instructions  Discharge Instructions    Call MD for:  difficulty breathing, headache or visual disturbances    Complete by:  As directed      Call MD for:  persistant nausea and vomiting    Complete by:  As directed      Call MD for:  severe uncontrolled pain    Complete by:  As directed      Diet - low sodium heart healthy    Complete by:  As directed      Increase activity slowly    Complete by:  As directed             Medication List    STOP taking these medications        oxyCODONE-acetaminophen 5-325 MG per tablet  Commonly known as:  PERCOCET/ROXICET     prochlorperazine 10 MG tablet  Commonly known as:  COMPAZINE      TAKE these medications        ALPRAZolam 1 MG tablet  Commonly known as:  XANAX  Take 1 tablet (1 mg total) by mouth 3 (three) times daily.     amitriptyline 50 MG tablet  Commonly known as:  ELAVIL  Take 50 mg by mouth daily.     aspirin 81 MG tablet  Take 81 mg by  mouth daily.     benzonatate 200 MG capsule  Commonly known as:  TESSALON  Take 200 mg by mouth daily.     ciprofloxacin 500 MG tablet  Commonly known as:  CIPRO  Take 1 tablet (500 mg total) by mouth 2 (two) times daily.     dextromethorphan-guaiFENesin 30-600 MG per 12 hr tablet  Commonly known as:  MUCINEX DM  Take 1 tablet by mouth 2 (two) times daily as needed for cough.     digoxin 0.125 MG tablet  Commonly known as:  LANOXIN  Take 1 tablet (0.125 mg total) by mouth daily.     docusate sodium 100 MG capsule  Commonly known as:  COLACE  Take 1 capsule (100 mg total) by mouth 2 (two) times daily.     folic acid 1 MG tablet  Commonly known as:  FOLVITE  Take 1 tablet (1 mg total) by mouth daily.     furosemide 40 MG tablet  Commonly known as:  LASIX  Take 1 tablet (40 mg total) by mouth daily.      HYDROcodone-homatropine 5-1.5 MG/5ML syrup  Commonly known as:  HYCODAN  Take 5 mLs by mouth every 6 (six) hours as needed for cough.     ipratropium 0.02 % nebulizer solution  Commonly known as:  ATROVENT  Take 2.5 mLs (0.5 mg total) by nebulization 4 (four) times daily.     levalbuterol 0.63 MG/3ML nebulizer solution  Commonly known as:  XOPENEX  Take 3 mLs (0.63 mg total) by nebulization 4 (four) times daily.     levalbuterol 45 MCG/ACT inhaler  Commonly known as:  XOPENEX HFA  Inhale 2 puffs into the lungs every 4 (four) hours as needed for wheezing.     losartan 25 MG tablet  Commonly known as:  COZAAR  Take 0.5 tablets (12.5 mg total) by mouth daily.     metoprolol succinate 25 MG 24 hr tablet  Commonly known as:  TOPROL-XL  Take 25 mg by mouth daily.     OXYGEN  Inhale 4 L into the lungs continuous.     polyethylene glycol packet  Commonly known as:  MIRALAX / GLYCOLAX  Take 17 g by mouth daily as needed for mild constipation or moderate constipation.     potassium chloride SA 20 MEQ tablet  Commonly known as:  K-DUR,KLOR-CON  TAKE 1 TABLET BY MOUTH EVERY DAY     predniSONE 10 MG tablet  Commonly known as:  DELTASONE  Take 10 mg by mouth daily.     SENOKOT S 8.6-50 MG per tablet  Generic drug:  senna-docusate  Take 1 tablet by mouth at bedtime.     spironolactone 25 MG tablet  Commonly known as:  ALDACTONE  Take 0.5 tablets (12.5 mg total) by mouth daily.     sucralfate 1 G tablet  Commonly known as:  CARAFATE  Take 1 tablet (1 g total) by mouth 4 (four) times daily -  with meals and at bedtime. Dissolve in 10 ml of water prior to taking     traZODone 50 MG tablet  Commonly known as:  DESYREL  Take 50 mg by mouth at bedtime as needed for sleep.           The results of significant diagnostics from this hospitalization (including imaging, microbiology, ancillary and laboratory) are listed below for reference.    Significant Diagnostic Studies: Dg  Chest 1 View  12/07/2014   CLINICAL DATA:  Post RIGHT thoracentesis,  history RIGHT upper lobe non-small-cell lung cancer post radiation therapy, COPD, former smoker, asthma  EXAM: CHEST  1 VIEW  COMPARISON:  12/06/2014  FINDINGS: Significant decrease in RIGHT pleural effusion post thoracentesis.  No pneumothorax.  Volume loss, scarring and opacity of the RIGHT upper lobe again identified compatible with history of neoplasm and radiation.  Underlying emphysematous changes.  Improved atelectasis LEFT mid lung.  Significant bullous disease LEFT upper lobe.  No new infiltrate identified.  Cysts  IMPRESSION: Significant decrease in RIGHT pleural effusion post thoracentesis with improved aeration of RIGHT lung.  Chronic volume loss in opacification of RIGHT upper lobe compatible with history of neoplasm and radiation.  Underlying bullous COPD with improved atelectasis in LEFT mid lung.   Electronically Signed   By: Lavonia Dana M.D.   On: 12/07/2014 11:08   Dg Chest 2 View  12/06/2014   CLINICAL DATA:  Fatigue.  Right upper lobe lung cancer.  EXAM: CHEST  2 VIEW  COMPARISON:  11/26/2014  FINDINGS: Further reduction in aeration in the right lung, with a small amount of aerated upper lobe and a small amount of aerated right middle lobe. Underlying pleural effusion suspected with extensive atelectasis. There is some shift of cardiac and mediastinal structures to the right as well as rightward tracheal shaft.  Severe emphysema. Left mid lung linear and nodular opacity, indistinct, new.  Thoracic spondylosis.  IMPRESSION: 1. Considerably reduced aeration in the right lung with only a small amount of right upper lobe and right middle lobe still aerated. This represents a significant worsening from 11/26/2014 with regard to the amount of aeration. There shift of the trachea and mediastinal structures to the right indicating atelectasis. 2. Markedly severe emphysema. 3. Indistinct linear nodular opacity in the left mid lung is  nonspecific and could be inflammatory or infectious. This is less likely to be malignant given that it was not present 10 days ago.   Electronically Signed   By: Van Clines M.D.   On: 12/06/2014 16:47   Ct Chest W Contrast  11/26/2014   CLINICAL DATA:  Right lung cancer diagnosed 2014. Radiation therapy complete. Chemotherapy ongoing.  EXAM: CT CHEST WITH CONTRAST  TECHNIQUE: Multidetector CT imaging of the chest was performed during intravenous contrast administration.  CONTRAST:  27m OMNIPAQUE IOHEXOL 300 MG/ML  SOLN  COMPARISON:  CT 09/12/2014  FINDINGS: Mediastinum/Nodes: No axillary lymphadenopathy. Right supraclavicular lymph node measures 11 mm (image 6, series 2) decreased from 20 mm. High right paratracheal lymph node measures 9 mm on image 10 series 2 compared to 11 mm. Necrotic prevascular lymph node measures 12 mm on image 16 compared to 12 mm.  Ill-defined tissue in the subcarinal mediastinum measures 16 mm not changed. There is no central pulmonary embolism. There is constriction of the right upper lobe bronchus and pulmonary artery.  Ill-defined right perihilar soft tissue thickening is unchanged.  Lungs/Pleura: There is a moderate right effusion which is partially loculated and not changed in volume compared to prior. There is consolidative airspace disease in the remaining right upper lobe similar prior. Right lower lobe is relatively clear with some atelectasis.  There is severe emphysema in the left upper lobe. No suspicious nodules.  Upper abdomen: There is thickening in the pleural space of the medial right lower lobe to 9 mm on image 44, series 2 which similar to comparison exam. Thickening along the crus of the diaphragms on image 53, series 2 mildly increased . Within the anterior pleural space there is  new thickening to 9 mm on image 43, series 2  Musculoskeletal: No aggressive osseous lesion. Multiple round calcifications the breast tissue are unchanged.  IMPRESSION: 1. Several  foci of nodular thickening in the inferior pleural space and along the crus of the diaphragm increased from prior. 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 2. Stable postsurgical and post therapy change within the right hemi thorax with ill-defined right perihilar thickening not significantly changed. 3. Right supraclavicular and mediastinal nodal metastasis are mildly decreased in volume. 4. Loculated right pleural effusion similar prior.   Electronically Signed   By: Suzy Bouchard M.D.   On: 11/26/2014 09:12   US Thoracentesis Asp Pleural Space W/img Guide  12/07/2014   INDICATION: Right lung cancer, dyspnea, right pleural effusion. Request is made for diagnostic and therapeutic right thoracentesis.  EXAM: ULTRASOUND GUIDED DIAGNOSTIC AND THERAPEUTIC RIGHT THORACENTESIS  COMPARISON:  None.  MEDICATIONS: None  COMPLICATIONS: None immediate  TECHNIQUE: Informed written consent was obtained from the patient after a discussion of the risks, benefits and alternatives to treatment. A timeout was performed prior to the initiation of the procedure.  Initial ultrasound scanning demonstrates a moderate to large right pleural effusion. The lower chest was prepped and draped in the usual sterile fashion. 1% lidocaine was used for local anesthesia.  An ultrasound image was saved for documentation purposes. A 6 Fr Safe-T-Centesis catheter was introduced. The thoracentesis was performed. The catheter was removed and a dressing was applied. The patient tolerated the procedure well without immediate post procedural complication. The patient was escorted to have an upright chest radiograph.  FINDINGS: A total of approximately 1.6 liters of turbid, amber fluid was removed. Requested samples were sent to the laboratory.  IMPRESSION: Successful ultrasound-guided diagnostic and therapeutic right sided thoracentesis yielding 1.6 liters of pleural fluid.  Read by: Rowe Robert, PA-C   Electronically Signed   By: Lucrezia Europe M.D.   On: 12/07/2014 11:33    Microbiology: Recent Results (from the past 240 hour(s))  Body fluid culture     Status: None (Preliminary result)   Collection Time: 12/07/14 10:47 AM  Result Value Ref Range Status   Specimen Description PLEURAL RIGHT  Final   Special Requests NONE  Final   Gram Stain   Final    WBC PRESENT,BOTH PMN AND MONONUCLEAR NO ORGANISMS SEEN Performed at [facility]    Culture PENDING  Incomplete   Report Status PENDING  Incomplete  Culture, sputum-assessment     Status: None   Collection Time: 12/07/14 12:00 PM  Result Value Ref Range Status   Specimen Description SPUTUM  Final   Special Requests Immunocompromised  Final   Sputum evaluation   Final    THIS SPECIMEN IS ACCEPTABLE. RESPIRATORY CULTURE REPORT TO FOLLOW.   Report Status 12/07/2014 FINAL  Final     Labs: Basic Metabolic Panel:  Recent Labs Lab 12/06/14 1338 12/06/14 1638 12/07/14 0408  NA 137 131* 132*  K 3.8 3.5 3.1*  CL  --  81* 80*  CO2 41 Repeated and Verified*  --  42*  GLUCOSE 181* 133* 195*  BUN 9._0 CREATININE 0.6 0.60 0.46  CALCIUM 10.4  --  9.2   Liver Function Tests:  Recent Labs Lab 12/06/14 1338 12/07/14 0408  AST 15 18  ALT 16 12*  ALKPHOS 87 70  BILITOT 0.37 0.4  PROT 7.1 6.3*  ALBUMIN 3.0* 2.9*   No results for input(s): LIPASE, AMYLASE in the last  168 hours. No results for input(s): AMMONIA in the last 168 hours. CBC:  Recent Labs Lab 12/06/14 1338 12/06/14 1638 12/07/14 0408  WBC 17.9*  --  19.0*  NEUTROABS 15.2*  --  17.1*  HGB 13.4 14.3 12.1  HCT 42.3 42.0 38.7  MCV 101.7*  --  100.3*  PLT 425*  --  432*   Cardiac Enzymes: No results for input(s): CKTOTAL, CKMB, CKMBINDEX, TROPONINI in the last 168 hours. BNP: BNP (last 3 results)  Recent Labs  09/16/14 0520 12/06/14 1404  BNP 824.7* 113.9*    ProBNP (last 3 results)  Recent Labs  01/03/14 1338  PROBNP 149.8*    CBG: No results for input(s):  GLUCAP in the last 168 hours.

## 2014-12-09 ENCOUNTER — Other Ambulatory Visit: Payer: Self-pay | Admitting: Internal Medicine

## 2014-12-09 LAB — LEGIONELLA ANTIGEN, URINE

## 2014-12-09 LAB — CULTURE, RESPIRATORY W GRAM STAIN: Gram Stain: NONE SEEN

## 2014-12-09 LAB — CULTURE, RESPIRATORY

## 2014-12-10 ENCOUNTER — Telehealth: Payer: Self-pay | Admitting: *Deleted

## 2014-12-10 NOTE — Telephone Encounter (Signed)
RECEIVED FAX FROM [employer] FOR SYMBICORT. KEY:[id number] PA SUBMITTED AWAITING DECISION.

## 2014-12-11 ENCOUNTER — Telehealth: Payer: Self-pay

## 2014-12-11 LAB — BODY FLUID CULTURE
Culture: NO GROWTH
Gram Stain: NONE SEEN

## 2014-12-11 NOTE — Telephone Encounter (Signed)
Pt called to inform us that she was in hospital on Friday. They did a right thoracentesis. She has lab and treatment tomorrow and wanted to know if she should keep these appts. She sees Adrena 7/21.

## 2014-12-11 NOTE — Telephone Encounter (Signed)
Form has been signed by RB. It was faxed in this morning. Form was placed in the blue accordion in Triage.

## 2014-12-11 NOTE — Telephone Encounter (Signed)
Yes. She needs to come for her appt. Thank you.

## 2014-12-11 NOTE — Telephone Encounter (Signed)
S/w Nicole Kindred to have Linda Donovan come for tx tomorrow. Nicole Kindred was asking if there is some way to monitor fluid on lungs. Explained we do not usually do scheduled serial CXR d/t radiation exposure, we typically monitor the pt symptoms. Nicole Kindred was hesitant with this response and said "I will just have to monitor her more closely". Forwarded this message to Dr Julien Nordmann so he is aware.

## 2014-12-11 NOTE — Telephone Encounter (Signed)
[employer] denied Symbicort ( Restricted Access medications may be covered when two or more alt have been tried). Pt must first fail Breo and Advair.

## 2014-12-11 NOTE — Telephone Encounter (Signed)
Will route message to RB to address when he comes back from vacation.

## 2014-12-12 ENCOUNTER — Other Ambulatory Visit (HOSPITAL_BASED_OUTPATIENT_CLINIC_OR_DEPARTMENT_OTHER): Payer: BLUE CROSS/BLUE SHIELD

## 2014-12-12 ENCOUNTER — Ambulatory Visit (HOSPITAL_BASED_OUTPATIENT_CLINIC_OR_DEPARTMENT_OTHER): Payer: BLUE CROSS/BLUE SHIELD

## 2014-12-12 VITALS — BP 99/69 | HR 103 | Temp 98.2°F | Resp 20

## 2014-12-12 DIAGNOSIS — C3491 Malignant neoplasm of unspecified part of right bronchus or lung: Secondary | ICD-10-CM

## 2014-12-12 DIAGNOSIS — C3490 Malignant neoplasm of unspecified part of unspecified bronchus or lung: Secondary | ICD-10-CM

## 2014-12-12 DIAGNOSIS — Z5112 Encounter for antineoplastic immunotherapy: Secondary | ICD-10-CM | POA: Diagnosis not present

## 2014-12-12 LAB — CBC WITH DIFFERENTIAL/PLATELET
BASO%: 0.1 % (ref 0.0–2.0)
Basophils Absolute: 0 10*3/uL (ref 0.0–0.1)
EOS%: 2.7 % (ref 0.0–7.0)
Eosinophils Absolute: 0.5 10*3/uL (ref 0.0–0.5)
HCT: 40.4 % (ref 34.8–46.6)
HGB: 13.4 g/dL (ref 11.6–15.9)
LYMPH%: 2.6 % — ABNORMAL LOW (ref 14.0–49.7)
MCH: 31.7 pg (ref 25.1–34.0)
MCHC: 33.1 g/dL (ref 31.5–36.0)
MCV: 95.9 fL (ref 79.5–101.0)
MONO#: 1.6 10*3/uL — ABNORMAL HIGH (ref 0.1–0.9)
MONO%: 8.9 % (ref 0.0–14.0)
NEUT#: 15.3 10*3/uL — ABNORMAL HIGH (ref 1.5–6.5)
NEUT%: 85.7 % — ABNORMAL HIGH (ref 38.4–76.8)
Platelets: 531 10*3/uL — ABNORMAL HIGH (ref 145–400)
RBC: 4.22 10*6/uL (ref 3.70–5.45)
RDW: 13.5 % (ref 11.2–14.5)
WBC: 17.9 10*3/uL — ABNORMAL HIGH (ref 3.9–10.3)
lymph#: 0.5 10*3/uL — ABNORMAL LOW (ref 0.9–3.3)

## 2014-12-12 LAB — COMPREHENSIVE METABOLIC PANEL (CC13)
ALK PHOS: 75 U/L (ref 40–150)
ALT: 16 U/L (ref 0–55)
AST: 16 U/L (ref 5–34)
Albumin: 2.7 g/dL — ABNORMAL LOW (ref 3.5–5.0)
Anion Gap: 11 mEq/L (ref 3–11)
BUN: 5.7 mg/dL — AB (ref 7.0–26.0)
CO2: 39 mEq/L — ABNORMAL HIGH (ref 22–29)
CREATININE: 0.6 mg/dL (ref 0.6–1.1)
Calcium: 9.7 mg/dL (ref 8.4–10.4)
Chloride: 85 mEq/L — ABNORMAL LOW (ref 98–109)
GLUCOSE: 188 mg/dL — AB (ref 70–140)
Potassium: 3.5 mEq/L (ref 3.5–5.1)
SODIUM: 134 meq/L — AB (ref 136–145)
Total Bilirubin: 0.43 mg/dL (ref 0.20–1.20)
Total Protein: 5.9 g/dL — ABNORMAL LOW (ref 6.4–8.3)

## 2014-12-12 MED ORDER — SODIUM CHLORIDE 0.9 % IV SOLN
Freq: Once | INTRAVENOUS | Status: AC
  Administered 2014-12-12: 10:00:00 via INTRAVENOUS

## 2014-12-12 MED ORDER — SODIUM CHLORIDE 0.9 % IV SOLN
3.0000 mg/kg | Freq: Once | INTRAVENOUS | Status: AC
  Administered 2014-12-12: 180 mg via INTRAVENOUS
  Filled 2014-12-12: qty 18

## 2014-12-12 NOTE — Patient Instructions (Signed)
[facility] Discharge Instructions for Patients Receiving Chemotherapy  Today you received the following biotherapy agents: Nivolumab.   If you develop nausea and vomiting that is not controlled by your nausea medication, call the clinic.   BELOW ARE SYMPTOMS THAT SHOULD BE REPORTED IMMEDIATELY:  *FEVER GREATER THAN 100.5 F  *CHILLS WITH OR WITHOUT FEVER  NAUSEA AND VOMITING THAT IS NOT CONTROLLED WITH YOUR NAUSEA MEDICATION  *UNUSUAL SHORTNESS OF BREATH  *UNUSUAL BRUISING OR BLEEDING  TENDERNESS IN MOUTH AND THROAT WITH OR WITHOUT PRESENCE OF ULCERS  *URINARY PROBLEMS  *BOWEL PROBLEMS  UNUSUAL RASH Items with * indicate a potential emergency and should be followed up as soon as possible.  Feel free to call the clinic should you have any questions or concerns. The clinic phone number is [phone].  Please show the [facility] at check-in to the Emergency Department and triage nurse.

## 2014-12-16 NOTE — Telephone Encounter (Signed)
lmtcb x1 for pt. 

## 2014-12-16 NOTE — Telephone Encounter (Signed)
I would prefer to try breo

## 2014-12-17 ENCOUNTER — Other Ambulatory Visit: Payer: Self-pay | Admitting: Internal Medicine

## 2014-12-18 ENCOUNTER — Other Ambulatory Visit: Payer: Self-pay | Admitting: Internal Medicine

## 2014-12-18 ENCOUNTER — Ambulatory Visit (HOSPITAL_COMMUNITY)
Admission: RE | Admit: 2014-12-18 | Discharge: 2014-12-18 | Disposition: A | Discharge status: Home / Self Care | Payer: BLUE CROSS/BLUE SHIELD | Source: Ambulatory Visit | Attending: Radiology | Admitting: Radiology

## 2014-12-18 ENCOUNTER — Ambulatory Visit (HOSPITAL_COMMUNITY)
Admission: RE | Admit: 2014-12-18 | Discharge: 2014-12-18 | Disposition: A | Discharge status: Home / Self Care | Payer: BLUE CROSS/BLUE SHIELD | Source: Ambulatory Visit | Attending: Internal Medicine | Admitting: Internal Medicine

## 2014-12-18 ENCOUNTER — Other Ambulatory Visit: Payer: Self-pay | Admitting: Medical Oncology

## 2014-12-18 ENCOUNTER — Telehealth: Payer: Self-pay | Admitting: Medical Oncology

## 2014-12-18 DIAGNOSIS — C384 Malignant neoplasm of pleura: Secondary | ICD-10-CM | POA: Diagnosis not present

## 2014-12-18 DIAGNOSIS — C3491 Malignant neoplasm of unspecified part of right bronchus or lung: Secondary | ICD-10-CM

## 2014-12-18 DIAGNOSIS — C3490 Malignant neoplasm of unspecified part of unspecified bronchus or lung: Secondary | ICD-10-CM

## 2014-12-18 DIAGNOSIS — J90 Pleural effusion, not elsewhere classified: Secondary | ICD-10-CM | POA: Insufficient documentation

## 2014-12-18 DIAGNOSIS — Z9889 Other specified postprocedural states: Secondary | ICD-10-CM

## 2014-12-18 MED ORDER — HYDROCODONE-HOMATROPINE 5-1.5 MG/5ML PO SYRP: 5.0000 mL | ORAL_SOLUTION | Freq: Four times a day (QID) | ORAL | Status: DC | PRN

## 2014-12-18 NOTE — Procedures (Signed)
US guided diagnostic/therapeutic right thoracentesis performed yielding 1.5 liters hazy,amber fluid. A portion of the fluid was sent to the lab for cytology. F/u CXR pending. No immediate complications.

## 2014-12-18 NOTE — Progress Notes (Signed)
rx left for pt to pick up .

## 2014-12-18 NOTE — Telephone Encounter (Signed)
Per Julien Nordmann cxr ordered for pt and instructed husband to take pt to radiology and to stay there until he hears from [facility] for further instructions.

## 2014-12-18 NOTE — Progress Notes (Signed)
Called in refill for cough syrup

## 2014-12-25 ENCOUNTER — Ambulatory Visit (HOSPITAL_COMMUNITY)
Admission: RE | Admit: 2014-12-25 | Discharge: 2014-12-25 | Disposition: A | Discharge status: Home / Self Care | Payer: BLUE CROSS/BLUE SHIELD | Source: Ambulatory Visit | Attending: Physician Assistant | Admitting: Physician Assistant

## 2014-12-25 ENCOUNTER — Other Ambulatory Visit: Payer: Self-pay | Admitting: Medical Oncology

## 2014-12-25 ENCOUNTER — Ambulatory Visit (HOSPITAL_COMMUNITY)
Admission: RE | Admit: 2014-12-25 | Discharge: 2014-12-25 | Disposition: A | Discharge status: Home / Self Care | Payer: BLUE CROSS/BLUE SHIELD | Source: Ambulatory Visit | Attending: Internal Medicine | Admitting: Internal Medicine

## 2014-12-25 ENCOUNTER — Telehealth: Payer: Self-pay | Admitting: *Deleted

## 2014-12-25 DIAGNOSIS — C3490 Malignant neoplasm of unspecified part of unspecified bronchus or lung: Secondary | ICD-10-CM | POA: Diagnosis present

## 2014-12-25 DIAGNOSIS — J90 Pleural effusion, not elsewhere classified: Secondary | ICD-10-CM

## 2014-12-25 DIAGNOSIS — Z87891 Personal history of nicotine dependence: Secondary | ICD-10-CM | POA: Insufficient documentation

## 2014-12-25 DIAGNOSIS — Z9889 Other specified postprocedural states: Secondary | ICD-10-CM | POA: Diagnosis not present

## 2014-12-25 DIAGNOSIS — R0602 Shortness of breath: Secondary | ICD-10-CM

## 2014-12-25 NOTE — Telephone Encounter (Signed)
VM message from pt's husband stating that she is very short of breath this morning and needs a thoracentesis. Made Shaketta, RN -Dr. Worthy Flank nurse aware.

## 2014-12-25 NOTE — Progress Notes (Signed)
Called [place] and ordered  thoracentesis. Left husband a voice message.

## 2014-12-25 NOTE — Telephone Encounter (Signed)
Husband notified re cxr today and wait results.

## 2014-12-25 NOTE — Procedures (Signed)
Successful US guided right thoracentesis. Yielded 900 mls of dark yellow fluid. Pt tolerated procedure well. No immediate complications.  Specimen was not sent for labs. CXR ordered.  Collyn Selk S Lillah Standre PA-C 12/25/2014 2:57 PM

## 2014-12-26 ENCOUNTER — Other Ambulatory Visit (HOSPITAL_BASED_OUTPATIENT_CLINIC_OR_DEPARTMENT_OTHER): Payer: BLUE CROSS/BLUE SHIELD

## 2014-12-26 ENCOUNTER — Telehealth: Payer: Self-pay | Admitting: Internal Medicine

## 2014-12-26 ENCOUNTER — Other Ambulatory Visit: Payer: Self-pay | Admitting: Medical Oncology

## 2014-12-26 ENCOUNTER — Telehealth: Payer: Self-pay | Admitting: *Deleted

## 2014-12-26 ENCOUNTER — Other Ambulatory Visit: Payer: Self-pay | Admitting: Internal Medicine

## 2014-12-26 ENCOUNTER — Ambulatory Visit (HOSPITAL_BASED_OUTPATIENT_CLINIC_OR_DEPARTMENT_OTHER): Payer: BLUE CROSS/BLUE SHIELD | Admitting: Internal Medicine

## 2014-12-26 ENCOUNTER — Ambulatory Visit (HOSPITAL_BASED_OUTPATIENT_CLINIC_OR_DEPARTMENT_OTHER): Payer: BLUE CROSS/BLUE SHIELD

## 2014-12-26 ENCOUNTER — Encounter: Payer: Self-pay | Admitting: Internal Medicine

## 2014-12-26 ENCOUNTER — Encounter: Payer: Self-pay | Admitting: *Deleted

## 2014-12-26 VITALS — BP 82/49 | HR 95 | Temp 98.7°F | Resp 17 | Ht 63.0 in | Wt 129.2 lb

## 2014-12-26 DIAGNOSIS — R0689 Other abnormalities of breathing: Secondary | ICD-10-CM | POA: Diagnosis not present

## 2014-12-26 DIAGNOSIS — C3491 Malignant neoplasm of unspecified part of right bronchus or lung: Secondary | ICD-10-CM

## 2014-12-26 DIAGNOSIS — Z5112 Encounter for antineoplastic immunotherapy: Secondary | ICD-10-CM | POA: Diagnosis not present

## 2014-12-26 DIAGNOSIS — J441 Chronic obstructive pulmonary disease with (acute) exacerbation: Secondary | ICD-10-CM

## 2014-12-26 DIAGNOSIS — R609 Edema, unspecified: Secondary | ICD-10-CM

## 2014-12-26 DIAGNOSIS — C3490 Malignant neoplasm of unspecified part of unspecified bronchus or lung: Secondary | ICD-10-CM

## 2014-12-26 DIAGNOSIS — J90 Pleural effusion, not elsewhere classified: Secondary | ICD-10-CM | POA: Diagnosis not present

## 2014-12-26 LAB — COMPREHENSIVE METABOLIC PANEL (CC13)
ALBUMIN: 2.5 g/dL — AB (ref 3.5–5.0)
ALT: 13 U/L (ref 0–55)
AST: 15 U/L (ref 5–34)
Alkaline Phosphatase: 70 U/L (ref 40–150)
Anion Gap: 10 mEq/L (ref 3–11)
BILIRUBIN TOTAL: 0.37 mg/dL (ref 0.20–1.20)
BUN: 5.3 mg/dL — ABNORMAL LOW (ref 7.0–26.0)
CO2: 42 mEq/L — ABNORMAL HIGH (ref 22–29)
Calcium: 9.6 mg/dL (ref 8.4–10.4)
Chloride: 81 mEq/L — ABNORMAL LOW (ref 98–109)
Creatinine: 0.6 mg/dL (ref 0.6–1.1)
EGFR: >90 mL/min/{1.73_m2} (ref >90)
Glucose: 166 mg/dl — ABNORMAL HIGH (ref 70–140)
Potassium: 3.3 mEq/L — ABNORMAL LOW (ref 3.5–5.1)
Sodium: 133 mEq/L — ABNORMAL LOW (ref 136–145)
Total Protein: 5.7 g/dL — ABNORMAL LOW (ref 6.4–8.3)

## 2014-12-26 LAB — CBC WITH DIFFERENTIAL/PLATELET
BASO%: 0.3 % (ref 0.0–2.0)
Basophils Absolute: 0 10*3/uL (ref 0.0–0.1)
EOS%: 3.2 % (ref 0.0–7.0)
Eosinophils Absolute: 0.5 10*3/uL (ref 0.0–0.5)
HEMATOCRIT: 41.2 % (ref 34.8–46.6)
HGB: 13 g/dL (ref 11.6–15.9)
LYMPH#: 0.4 10*3/uL — AB (ref 0.9–3.3)
LYMPH%: 2.5 % — AB (ref 14.0–49.7)
MCH: 30.7 pg (ref 25.1–34.0)
MCHC: 31.6 g/dL (ref 31.5–36.0)
MCV: 97.2 fL (ref 79.5–101.0)
MONO#: 1.5 10*3/uL — AB (ref 0.1–0.9)
MONO%: 9.6 % (ref 0.0–14.0)
NEUT%: 84.4 % — ABNORMAL HIGH (ref 38.4–76.8)
NEUTROS ABS: 13.1 10*3/uL — AB (ref 1.5–6.5)
Platelets: 362 10*3/uL (ref 145–400)
RBC: 4.24 10*6/uL (ref 3.70–5.45)
RDW: 12.8 % (ref 11.2–14.5)
WBC: 15.5 10*3/uL — AB (ref 3.9–10.3)

## 2014-12-26 LAB — TSH CHCC: TSH: 1.044 m[IU]/L (ref 0.308–3.960)

## 2014-12-26 MED ORDER — SODIUM CHLORIDE 0.9 % IV SOLN
Freq: Once | INTRAVENOUS | Status: AC
  Administered 2014-12-26: 12:00:00 via INTRAVENOUS

## 2014-12-26 MED ORDER — SODIUM CHLORIDE 0.9 % IV SOLN
3.0000 mg/kg | Freq: Once | INTRAVENOUS | Status: AC
  Administered 2014-12-26: 180 mg via INTRAVENOUS
  Filled 2014-12-26: qty 18

## 2014-12-26 NOTE — Progress Notes (Signed)
[place] [phone]   [phone]  OFFICE PROGRESS NOTE   DIAGNOSIS: Recurrent non-small cell lung cancer initially diagnosed as Stage IIIA non-small cell lung cancer, adenocarcinoma with negative EGFR mutation and negative ALK gene translocation diagnosed in March of 2014   PRIOR THERAPY:  1) Concurrent chemoradiation with weekly carboplatin for AUC of 2 and paclitaxel 45 mg/M2, status post 8 cycles, last dose was given on 11/13/2012 with partial response.  2) Consolidation chemotherapy with carboplatin for AUC of 5 on day 1 and gemcitabine 1000 mg/M2 on days 1 and 8 every 3 weeks, status post 3 cycles, last dose was given 02/20/2013 with mild improvement in her disease . First cycle was given on 01/02/2013.  3) status post subxiphoid pericardial window under the care of Dr. Roxan Hockey on 01/04/2014 and the final pathology showed no evidence for malignancy. 4) Systemic chemotherapy with carboplatin for AUC of 5 and Alimta 500 MG/M2 every 3 weeks. First dose 08/20/2014 discontinued secondary to intolerance  CURRENT THERAPY: Immunotherapy with Nivolumab 3 MG/KG every 2 weeks, first dose 11/28/2014. Status post 2 cycles.  CHEMOTHERAPY INTENT: Control  CURRENT # OF CHEMOTHERAPY CYCLES: 3 CURRENT ANTIEMETICS: Zofran, dexamethasone and Compazine  CURRENT SMOKING STATUS: Former smoker, quit in [place]: None  CURRENT BISPHOSPHONATES USE: None  PAIN MANAGEMENT: 8/10, currently Percocet 5/325, 1-2 tabs q 4 hours  NARCOTICS INDUCED CONSTIPATION: None  LIVING WILL AND CODE STATUS: Full code  INTERVAL HISTORY: Linda Donovan 59 y.o. female returns to the clinic today for followup visit accompanied by her husband. The patient is currently on treatment with immunotherapy with Nivolumab status post 2 cycles and tolerating her treatment well. She continues to have shortness of breath and currently on home oxygen. She is currently on  4 L/minute. The patient also has dry cough. She recently underwent a right sided thoracentesis 3 times with drainage of around 1.5 L of pleural fluid. Last thoracentesis was on 12/25/2014. She denied having any significant chest pain or hemoptysis. She has no significant weight loss or night sweats. She has no fever or chills. She has no nausea or vomiting. She is here today to start cycle #3 of her immunotherapy.  MEDICAL HISTORY: Past Medical History  Diagnosis Date  . COPD (chronic obstructive pulmonary disease)   . ADD (attention deficit disorder)   . Hx of radiation therapy 09/28/12- 11/17/12    RU lung mass, mediastinum chest, 63 gray 35 fx  . Pneumonia   . Arthritis   . Non-small cell lung cancer 09/18/2012    RUL  . Asthma     ALLERGIES:  is allergic to clinoril and morphine.  MEDICATIONS:  Current Outpatient Prescriptions  Medication Sig Dispense Refill  . ALPRAZolam (XANAX) 1 MG tablet Take 1 tablet (1 mg total) by mouth 3 (three) times daily. 90 tablet 0  . amitriptyline (ELAVIL) 50 MG tablet Take 50 mg by mouth daily.   2  . aspirin 81 MG tablet Take 81 mg by mouth daily.    . benzonatate (TESSALON) 200 MG capsule Take 200 mg by mouth daily.   3  . ciprofloxacin (CIPRO) 500 MG tablet Take 1 tablet (500 mg total) by mouth 2 (two) times daily. 14 tablet 0  . dextromethorphan-guaiFENesin (MUCINEX DM) 30-600 MG per 12 hr tablet Take 1 tablet by mouth 2 (two) times daily as needed for cough.    . digoxin (LANOXIN) 0.125 MG tablet Take 1 tablet (  0.125 mg total) by mouth daily. 30 tablet 3  . docusate sodium (COLACE) 100 MG capsule Take 1 capsule (100 mg total) by mouth 2 (two) times daily. 10 capsule 0  . folic acid (FOLVITE) 1 MG tablet Take 1 tablet (1 mg total) by mouth daily. 30 tablet 4  . furosemide (LASIX) 40 MG tablet TAKE 1 TABLET BY MOUTH ONCE DAILY 30 tablet 0  . HYDROcodone-homatropine (HYCODAN) 5-1.5 MG/5ML syrup Take 5 mLs by mouth every 6 (six) hours as needed for  cough. 240 mL 0  . ipratropium (ATROVENT) 0.02 % nebulizer solution Take 2.5 mLs (0.5 mg total) by nebulization 4 (four) times daily. 75 mL 12  . levalbuterol (XOPENEX HFA) 45 MCG/ACT inhaler Inhale 2 puffs into the lungs every 4 (four) hours as needed for wheezing. 1 Inhaler 11  . levalbuterol (XOPENEX) 0.63 MG/3ML nebulizer solution Take 3 mLs (0.63 mg total) by nebulization 4 (four) times daily. 3 mL 12  . losartan (COZAAR) 25 MG tablet Take 0.5 tablets (12.5 mg total) by mouth daily. 15 tablet 3  . metoprolol succinate (TOPROL-XL) 25 MG 24 hr tablet Take 25 mg by mouth daily.  5  . oxyCODONE-acetaminophen (PERCOCET/ROXICET) 5-325 MG per tablet Take 1 tablet by mouth every 4 (four) hours as needed.  0  . OXYGEN Inhale 4 L into the lungs continuous.     . polyethylene glycol (MIRALAX / GLYCOLAX) packet Take 17 g by mouth daily as needed for mild constipation or moderate constipation. 14 each 0  . potassium chloride SA (K-DUR,KLOR-CON) 20 MEQ tablet TAKE 1 TABLET BY MOUTH EVERY DAY 30 tablet 0  . predniSONE (DELTASONE) 10 MG tablet Take 10 mg by mouth daily.  3  . SENOKOT S 8.6-50 MG per tablet Take 1 tablet by mouth at bedtime.   0  . spironolactone (ALDACTONE) 25 MG tablet Take 0.5 tablets (12.5 mg total) by mouth daily. 15 tablet 3  . sucralfate (CARAFATE) 1 G tablet Take 1 tablet (1 g total) by mouth 4 (four) times daily -  with meals and at bedtime. Dissolve in 10 ml of water prior to taking 120 tablet 0  . SYMBICORT 160-4.5 MCG/ACT inhaler   5  . traZODone (DESYREL) 50 MG tablet Take 50 mg by mouth at bedtime as needed for sleep.      No current facility-administered medications for this visit.    SURGICAL HISTORY:  Past Surgical History  Procedure Laterality Date  . Total abdominal hysterectomy    . Shoulder arthroscopy    . Video bronchoscopy Bilateral 07/25/2012    Procedure: VIDEO BRONCHOSCOPY WITH FLUORO;  Surgeon: Tanda Rockers, MD;  Location: [facility];  Service: Endoscopy;   Laterality: Bilateral;  . Lump removed from breasr right  2003  . Endobronchial ultrasound Bilateral 09/04/2012    Procedure: ENDOBRONCHIAL ULTRASOUND;  Surgeon: Collene Gobble, MD;  Location: [facility];  Service: Cardiopulmonary;  Laterality: Bilateral;  . Breast surgery    . Subxyphoid pericardial window N/A 01/04/2014    Procedure: SUBXYPHOID PERICARDIAL WINDOW;  Surgeon: [name], MD;  Location: [facility];  Service: Thoracic;  Laterality: N/A;  . Intraoperative transesophageal echocardiogram N/A 01/04/2014    Procedure: INTRAOPERATIVE TRANSESOPHAGEAL ECHOCARDIOGRAM;  Surgeon: [name], MD;  Location: [facility];  Service: Open Heart Surgery;  Laterality: N/A;    REVIEW OF SYSTEMS:  Constitutional: positive for fatigue Eyes: negative Ears, nose, mouth, throat, and face: negative Respiratory: positive for cough, dyspnea on exertion and wheezing Cardiovascular:  negative Gastrointestinal: negative Genitourinary:negative Integument/breast: negative Hematologic/lymphatic: negative Musculoskeletal:negative Neurological: negative Behavioral/Psych: negative Endocrine: negative Allergic/Immunologic: negative   PHYSICAL EXAMINATION: General appearance: alert, cooperative, fatigued and no distress Head: Normocephalic, without obvious abnormality, atraumatic Neck: no adenopathy, no JVD, supple, symmetrical, trachea midline and thyroid not enlarged, symmetric, no tenderness/mass/nodules Lymph nodes: Cervical, supraclavicular, and axillary nodes normal. Resp: clear to auscultation bilaterally and normal percussion bilaterally Back: symmetric, no curvature. ROM normal. No CVA tenderness. Cardio: regular rate and rhythm, S1, S2 normal, no murmur, click, rub or gallop and normal apical impulse GI: soft, non-tender; bowel sounds normal; no masses,  no organomegaly Extremities: extremities normal, atraumatic, no cyanosis or edema Neurologic: Alert and oriented X 3, normal strength  and tone. Normal symmetric reflexes. Normal coordination and gait  ECOG PERFORMANCE STATUS: 2 - Symptomatic, <50% confined to bed  Blood pressure 82/49, pulse 95, temperature 98.7 F (37.1 C), temperature source Oral, resp. rate 17, height $RemoveBe'5\' 3"'AftpPLcuJ$  (1.6 m), weight 129 lb 3.2 oz (58.605 kg), SpO2 97 %.  LABORATORY DATA: Lab Results  Component Value Date   WBC 15.5* 12/26/2014   HGB 13.0 12/26/2014   HCT 41.2 12/26/2014   MCV 97.2 12/26/2014   PLT 362 12/26/2014      Chemistry      Component Value Date/Time   NA 133* 12/26/2014 0939   NA 132* 12/07/2014 0408   K 3.3* 12/26/2014 0939   K 3.1* 12/07/2014 0408   CL 80* 12/07/2014 0408   CL 104 11/13/2012 1122   CO2 42 Repeated and Verified* 12/26/2014 0939   CO2 42* 12/07/2014 0408   BUN 5.3* 12/26/2014 0939   BUN 12 12/07/2014 0408   CREATININE 0.6 12/26/2014 0939   CREATININE 0.46 12/07/2014 0408      Component Value Date/Time   CALCIUM 9.6 12/26/2014 0939   CALCIUM 9.2 12/07/2014 0408   ALKPHOS 70 12/26/2014 0939   ALKPHOS 70 12/07/2014 0408   AST 15 12/26/2014 0939   AST 18 12/07/2014 0408   ALT 13 12/26/2014 0939   ALT 12* 12/07/2014 0408   BILITOT 0.37 12/26/2014 0939   BILITOT 0.4 12/07/2014 0408       RADIOGRAPHIC STUDIES: Dg Chest 1 View  12/25/2014   CLINICAL DATA:  Post thoracentesis.  EXAM: CHEST  1 VIEW  COMPARISON:  Chest x-ray from earlier same day.  FINDINGS: Interval resolution of the right pleural effusion, at the right lung base, status post thoracentesis. Opacity at the right lung apex is unchanged and, by a previous chest x-ray report, consistent with a known malignancy. Left lung appears stable with hyper expansion and probable basilar scarring/fibrosis.  Cardiomediastinal silhouette is stable in size and configuration. No acute osseous abnormality identified. No pneumothorax seen.  IMPRESSION: Interval resolution of the pleural effusion at the right lung base status post thoracentesis. No procedural  complicating feature identified.   Electronically Signed   By: Franki Cabot M.D.   On: 12/25/2014 15:12   Dg Chest 1 View  12/18/2014   CLINICAL DATA:  History of lung cancer, now with recurrent symptomatic right-sided pleural effusion. Post right-sided thoracentesis.  EXAM: CHEST  1 VIEW  COMPARISON:  12/18/2014; 12/07/2010; 12/06/2014; ultrasound-guided right-sided thoracentesis - 12/07/2014  FINDINGS: Grossly unchanged cardiac silhouette and mediastinal contours. Significant reduction / near resolution of right-sided pleural effusion post thoracentesis. No pneumothorax. Marked improved aeration of the right lung with grossly unchanged consolidative mass within the medial aspect the right lung apex compatible with known malignancy. No new focal airspace opacities.  The lungs remain hyperexpanded. No evidence of edema. Unchanged bones.  IMPRESSION: 1. Significant reduction/near resolution of right-sided pleural effusion post thoracentesis. No pneumothorax. 2. Marked improved aeration of the right lung with grossly unchanged consolidative mass within medial aspect of the right lung apex compatible with known malignancy.   Electronically Signed   By: Sandi Mariscal M.D.   On: 12/18/2014 16:53   Dg Chest 1 View  12/07/2014   CLINICAL DATA:  Post RIGHT thoracentesis, history RIGHT upper lobe non-small-cell lung cancer post radiation therapy, COPD, former smoker, asthma  EXAM: CHEST  1 VIEW  COMPARISON:  12/06/2014  FINDINGS: Significant decrease in RIGHT pleural effusion post thoracentesis.  No pneumothorax.  Volume loss, scarring and opacity of the RIGHT upper lobe again identified compatible with history of neoplasm and radiation.  Underlying emphysematous changes.  Improved atelectasis LEFT mid lung.  Significant bullous disease LEFT upper lobe.  No new infiltrate identified.  Cysts  IMPRESSION: Significant decrease in RIGHT pleural effusion post thoracentesis with improved aeration of RIGHT lung.  Chronic volume  loss in opacification of RIGHT upper lobe compatible with history of neoplasm and radiation.  Underlying bullous COPD with improved atelectasis in LEFT mid lung.   Electronically Signed   By: Lavonia Dana M.D.   On: 12/07/2014 11:08   Dg Chest 2 View  12/25/2014   CLINICAL DATA:  59 year old female with shortness of breath. Non-small cell lung cancer. Subsequent encounter.  EXAM: CHEST  2 VIEW  COMPARISON:  12/18/2014 and earlier  FINDINGS: Interval reaccumulation of the loculated right pleural effusion, but less severe than on 12/18/2014. Mild residual right lung ventilation. Stable visualized mediastinal contours. The left lung appears stable. No pneumothorax. Stable visualized osseous structures.  IMPRESSION: Re-accumulation of the loculated right pleural effusion, but less severe than that seen prior to the 12/18/2014 thoracentesis.  Otherwise stable chest.   Electronically Signed   By: Genevie Ann M.D.   On: 12/25/2014 10:42   Dg Chest 2 View  12/18/2014   CLINICAL DATA:  59 year old female with increasing shortness of breath status post thoracentesis  EXAM: CHEST  2 VIEW  COMPARISON:  Prior chest x-ray 12/07/2014 and 12/06/2014; prior chest CT 11/26/2014  FINDINGS: Interval reaccumulation of pleural fluid which is now loculated in appearance posteriorly, laterally and inferiorly. There is near whiteout of the right chest. The left lung remains hyperinflated with a background bronchitic changes, emphysema and areas of pleural parenchymal scarring. Persistent volume loss in the right hemi thorax with left-to-right shift of the cardiac and mediastinal structures. No acute osseous abnormality.  IMPRESSION: 1. Interval reaccumulation of large right-sided pleural effusion which is now loculated in appearance. 2. Unchanged severe emphysema and hyperinflation involving the left lung.   Electronically Signed   By: Jacqulynn Cadet M.D.   On: 12/18/2014 13:03   Dg Chest 2 View  12/06/2014   CLINICAL DATA:  Fatigue.   Right upper lobe lung cancer.  EXAM: CHEST  2 VIEW  COMPARISON:  11/26/2014  FINDINGS: Further reduction in aeration in the right lung, with a small amount of aerated upper lobe and a small amount of aerated right middle lobe. Underlying pleural effusion suspected with extensive atelectasis. There is some shift of cardiac and mediastinal structures to the right as well as rightward tracheal shaft.  Severe emphysema. Left mid lung linear and nodular opacity, indistinct, new.  Thoracic spondylosis.  IMPRESSION: 1. Considerably reduced aeration in the right lung with only a small amount of right upper lobe  and right middle lobe still aerated. This represents a significant worsening from 11/26/2014 with regard to the amount of aeration. There shift of the trachea and mediastinal structures to the right indicating atelectasis. 2. Markedly severe emphysema. 3. Indistinct linear nodular opacity in the left mid lung is nonspecific and could be inflammatory or infectious. This is less likely to be malignant given that it was not present 10 days ago.   Electronically Signed   By: Van Clines M.D.   On: 12/06/2014 16:47   US Thoracentesis Asp Pleural Space W/img Guide  12/25/2014   CLINICAL DATA:  Recurrent pleural effusion due to lung cancer  EXAM: ULTRASOUND GUIDED RIGHT THORACENTESIS  COMPARISON:  None.  PROCEDURE: An ultrasound guided thoracentesis was thoroughly discussed with the patient and questions answered. The benefits, risks, alternatives and complications were also discussed. The patient understands and wishes to proceed with the procedure. Written consent was obtained.  Ultrasound was performed to localize and mark an adequate pocket of fluid in the right chest. The area was then prepped and draped in the normal sterile fashion. 1% Lidocaine was used for local anesthesia. Under ultrasound guidance a Safe T Centesis catheter was introduced. Thoracentesis was performed. The catheter was removed and a  dressing applied.  COMPLICATIONS: None immediate.  FINDINGS: A total of approximately 900 mls of dark yellow fluid was removed. A fluid sample was notsent for laboratory analysis.  IMPRESSION: Successful ultrasound guided right thoracentesis yielding 900 mls of pleural fluid.  Read by:  Gareth Eagle, PA-C   Electronically Signed   By: Sandi Mariscal M.D.   On: 12/25/2014 14:55   US Thoracentesis Asp Pleural Space W/img Guide  12/18/2014   INDICATION: Right lung cancer, dyspnea, recurrent right pleural effusion. Request is made for diagnostic and therapeutic right thoracentesis.  EXAM: ULTRASOUND GUIDED DIAGNOSTIC AND THERAPEUTIC RIGHT THORACENTESIS  COMPARISON:  Prior thoracentesis on 12/07/2014  MEDICATIONS: None  COMPLICATIONS: None immediate  TECHNIQUE: Informed written consent was obtained from the patient after a discussion of the risks, benefits and alternatives to treatment. A timeout was performed prior to the initiation of the procedure.  Initial ultrasound scanning demonstrates a moderate to large right pleural effusion. The lower chest was prepped and draped in the usual sterile fashion. 1% lidocaine was used for local anesthesia.  An ultrasound image was saved for documentation purposes. A 6 Fr Safe-T-Centesis catheter was introduced. The thoracentesis was performed. The catheter was removed and a dressing was applied. The patient tolerated the procedure well without immediate post procedural complication. The patient was escorted to have an upright chest radiograph.  FINDINGS: A total of approximately 1.5 liters of hazy, amber fluid was removed. Requested samples were sent to the laboratory.  IMPRESSION: Successful ultrasound-guided diagnostic and therapeutic right sided thoracentesis yielding 1.5 liters of pleural fluid.  Read by: Rowe Robert, PA-C   Electronically Signed   By: Sandi Mariscal M.D.   On: 12/18/2014 16:29   US Thoracentesis Asp Pleural Space W/img Guide  12/07/2014   INDICATION: Right  lung cancer, dyspnea, right pleural effusion. Request is made for diagnostic and therapeutic right thoracentesis.  EXAM: ULTRASOUND GUIDED DIAGNOSTIC AND THERAPEUTIC RIGHT THORACENTESIS  COMPARISON:  None.  MEDICATIONS: None  COMPLICATIONS: None immediate  TECHNIQUE: Informed written consent was obtained from the patient after a discussion of the risks, benefits and alternatives to treatment. A timeout was performed prior to the initiation of the procedure.  Initial ultrasound scanning demonstrates a moderate to large right pleural effusion. The  lower chest was prepped and draped in the usual sterile fashion. 1% lidocaine was used for local anesthesia.  An ultrasound image was saved for documentation purposes. A 6 Fr Safe-T-Centesis catheter was introduced. The thoracentesis was performed. The catheter was removed and a dressing was applied. The patient tolerated the procedure well without immediate post procedural complication. The patient was escorted to have an upright chest radiograph.  FINDINGS: A total of approximately 1.6 liters of turbid, amber fluid was removed. Requested samples were sent to the laboratory.  IMPRESSION: Successful ultrasound-guided diagnostic and therapeutic right sided thoracentesis yielding 1.6 liters of pleural fluid.  Read by: Rowe Robert, PA-C   Electronically Signed   By: Lucrezia Europe M.D.   On: 12/07/2014 11:33    ASSESSMENT AND PLAN: This is a very pleasant 59 years old white female with stage IIIA non-small cell lung cancer, adenocarcinoma with negative EGFR mutation and negative ALK gene translocation is status post concurrent chemoradiation with weekly carboplatin and paclitaxel followed by consolidation chemotherapy with 3 cycles of carboplatin and gemcitabine. The patient was found to have evidence for disease progression and was started on systemic chemotherapy with one cycle of carboplatin and Alimta. This was complicated with pancytopenia. The patient was also recently  treated for COPD exacerbation as well as congestive heart failure. Because of the comorbidities and deterioration of her condition she was referred to the palliative care and hospice.  She has been on hospice for the last 2 months but the patient is interested in resuming treatment. She is currently undergoing treatment with immunotherapy with Nivolumab 3 MG/KG every 2 weeks is status post 2 cycles and tolerating her treatment fairly well. We will proceed with cycle #3 today as a scheduled. For the shortness of breath and CO2 retention, I recommended for the patient to decrease her home oxygen to 2 L/minute and total consult with Dr. Lamonte Sakai regarding this issue. She would come back for follow-up visit in 2 weeks for reevaluation before starting cycle #3. For the recurrent right pleural effusion, I may consider referring the patient to thoracic surgery for consideration of Pleurx catheter placement if there is any further reaccumulation of her pleural fluid. The patient was advised to call immediately if she has any concerning symptoms in the interval.  The patient voices understanding of current disease status and treatment options and is in agreement with the current care plan.  All questions were answered. The patient knows to call the clinic with any problems, questions or concerns. We can certainly see the patient much sooner if necessary.  Disclaimer: This note was dictated with voice recognition software. Similar sounding words can inadvertently be transcribed and may not be corrected upon review.

## 2014-12-26 NOTE — Telephone Encounter (Signed)
Per staff message and POF I have scheduled appts. Advised scheduler of appts. JMW  

## 2014-12-26 NOTE — Progress Notes (Signed)
erroneous

## 2014-12-26 NOTE — Telephone Encounter (Signed)
Pt confirmed labs/ov per 07/21 POF, gave pt AVS and Calendar.... KJ, sent msg to add chemo

## 2014-12-26 NOTE — Progress Notes (Signed)
Oncology Nurse Navigator Documentation  Oncology Nurse Navigator Flowsheets 12/26/2014  Navigator Encounter Type Other/spoke with patient and husband today at [facility].  Her lab work showed and elevated CO2 level and Dr. Julien Nordmann was concerned.  I in-basket Dr. Lamonte Sakai for follow up on her O2   Patient Visit Type Follow-up  Treatment Phase Treatment  Barriers/Navigation Needs Education  Interventions Coordination of Care

## 2014-12-27 ENCOUNTER — Other Ambulatory Visit: Payer: Self-pay | Admitting: Internal Medicine

## 2014-12-27 ENCOUNTER — Telehealth: Payer: Self-pay | Admitting: Medical Oncology

## 2014-12-27 DIAGNOSIS — I519 Heart disease, unspecified: Secondary | ICD-10-CM

## 2014-12-27 DIAGNOSIS — I5189 Other ill-defined heart diseases: Secondary | ICD-10-CM

## 2014-12-27 DIAGNOSIS — R609 Edema, unspecified: Secondary | ICD-10-CM

## 2014-12-27 DIAGNOSIS — I5021 Acute systolic (congestive) heart failure: Secondary | ICD-10-CM

## 2014-12-27 MED ORDER — FUROSEMIDE 40 MG PO TABS: 40.0000 mg | ORAL_TABLET | Freq: Every day | ORAL | Status: DC

## 2014-12-27 NOTE — Telephone Encounter (Signed)
Nicole Kindred states he has to give Anya and extra lasix does 2-3 times a week for swelling and would like # 60 pills. Note to [place].

## 2014-12-28 ENCOUNTER — Other Ambulatory Visit: Payer: Self-pay | Admitting: Internal Medicine

## 2014-12-28 DIAGNOSIS — R609 Edema, unspecified: Secondary | ICD-10-CM

## 2014-12-28 DIAGNOSIS — I5021 Acute systolic (congestive) heart failure: Secondary | ICD-10-CM

## 2014-12-28 DIAGNOSIS — I519 Heart disease, unspecified: Secondary | ICD-10-CM

## 2014-12-28 DIAGNOSIS — I5189 Other ill-defined heart diseases: Secondary | ICD-10-CM

## 2014-12-30 ENCOUNTER — Other Ambulatory Visit: Payer: Self-pay | Admitting: Medical Oncology

## 2014-12-30 DIAGNOSIS — C3490 Malignant neoplasm of unspecified part of unspecified bronchus or lung: Secondary | ICD-10-CM

## 2014-12-30 MED ORDER — AMITRIPTYLINE HCL 50 MG PO TABS: 50.0000 mg | ORAL_TABLET | Freq: Every day | ORAL | Status: AC

## 2014-12-30 NOTE — Progress Notes (Signed)
called in elavil refill

## 2014-12-31 ENCOUNTER — Other Ambulatory Visit: Payer: Self-pay | Admitting: *Deleted

## 2014-12-31 MED ORDER — FLUTICASONE FUROATE-VILANTEROL 100-25 MCG/INH IN AEPB: 1.0000 | INHALATION_SPRAY | Freq: Every day | RESPIRATORY_TRACT | Status: AC

## 2014-12-31 MED ORDER — LEVALBUTEROL TARTRATE 45 MCG/ACT IN AERO: 2.0000 | INHALATION_SPRAY | RESPIRATORY_TRACT | Status: AC | PRN

## 2014-12-31 MED ORDER — LEVALBUTEROL HCL 0.63 MG/3ML IN NEBU: 0.6300 mg | INHALATION_SOLUTION | Freq: Four times a day (QID) | RESPIRATORY_TRACT | Status: AC

## 2014-12-31 NOTE — Telephone Encounter (Signed)
Breo 100. Refill on Xopenex is fine.

## 2014-12-31 NOTE — Telephone Encounter (Signed)
I contacted patient. Breo ? Needs to be sent to the pharmacy. What dose does Dr. Lamonte Sakai want? Pt also requested a refill on Xopenex HFA.

## 2015-01-01 ENCOUNTER — Other Ambulatory Visit: Payer: Self-pay | Admitting: Physician Assistant

## 2015-01-01 ENCOUNTER — Other Ambulatory Visit: Payer: Self-pay | Admitting: Medical Oncology

## 2015-01-01 ENCOUNTER — Telehealth: Payer: Self-pay | Admitting: *Deleted

## 2015-01-01 DIAGNOSIS — C3490 Malignant neoplasm of unspecified part of unspecified bronchus or lung: Secondary | ICD-10-CM

## 2015-01-01 MED ORDER — HYDROCODONE-HOMATROPINE 5-1.5 MG/5ML PO SYRP: 5.0000 mL | ORAL_SOLUTION | Freq: Four times a day (QID) | ORAL | Status: DC | PRN

## 2015-01-01 NOTE — Progress Notes (Signed)
rx locked up. 

## 2015-01-01 NOTE — Telephone Encounter (Signed)
Patient's husband,Tony called.  He had talked with Abelina Bachelor RN [id number] prescription and she said it was ok.  He contacted pharmacy and "they didn't have anything."  I can see lasix escribed on 7-22 and "receit confirmed by pharmacy."  Called pharmacy at [phone] and they have script and will get it ready for him.  Called Nicole Kindred back and let him know that script will be ready.

## 2015-01-02 ENCOUNTER — Telehealth: Payer: Self-pay | Admitting: *Deleted

## 2015-01-02 ENCOUNTER — Other Ambulatory Visit: Payer: Self-pay | Admitting: Internal Medicine

## 2015-01-02 ENCOUNTER — Inpatient Hospital Stay (HOSPITAL_COMMUNITY)
Admission: EM | Admit: 2015-01-02 | Discharge: 2015-01-03 | DRG: 180 | Disposition: A | Discharge status: Home / Self Care | Payer: BLUE CROSS/BLUE SHIELD | Attending: Internal Medicine | Admitting: Internal Medicine

## 2015-01-02 ENCOUNTER — Other Ambulatory Visit: Payer: Self-pay | Admitting: Medical Oncology

## 2015-01-02 ENCOUNTER — Telehealth: Payer: Self-pay

## 2015-01-02 ENCOUNTER — Emergency Department (HOSPITAL_COMMUNITY): Payer: BLUE CROSS/BLUE SHIELD

## 2015-01-02 ENCOUNTER — Other Ambulatory Visit: Payer: Self-pay

## 2015-01-02 ENCOUNTER — Encounter (HOSPITAL_COMMUNITY): Payer: Self-pay | Admitting: Emergency Medicine

## 2015-01-02 ENCOUNTER — Ambulatory Visit (HOSPITAL_COMMUNITY)
Admission: RE | Admit: 2015-01-02 | Discharge: 2015-01-02 | Disposition: A | Discharge status: Home / Self Care | Payer: BLUE CROSS/BLUE SHIELD | Source: Ambulatory Visit | Attending: Internal Medicine | Admitting: Internal Medicine

## 2015-01-02 DIAGNOSIS — Z79891 Long term (current) use of opiate analgesic: Secondary | ICD-10-CM

## 2015-01-02 DIAGNOSIS — Z7952 Long term (current) use of systemic steroids: Secondary | ICD-10-CM

## 2015-01-02 DIAGNOSIS — J45909 Unspecified asthma, uncomplicated: Secondary | ICD-10-CM | POA: Diagnosis present

## 2015-01-02 DIAGNOSIS — J90 Pleural effusion, not elsewhere classified: Secondary | ICD-10-CM

## 2015-01-02 DIAGNOSIS — J9622 Acute and chronic respiratory failure with hypercapnia: Secondary | ICD-10-CM | POA: Diagnosis present

## 2015-01-02 DIAGNOSIS — C3490 Malignant neoplasm of unspecified part of unspecified bronchus or lung: Principal | ICD-10-CM | POA: Diagnosis present

## 2015-01-02 DIAGNOSIS — E872 Acidosis: Secondary | ICD-10-CM | POA: Diagnosis present

## 2015-01-02 DIAGNOSIS — R0902 Hypoxemia: Secondary | ICD-10-CM

## 2015-01-02 DIAGNOSIS — I5022 Chronic systolic (congestive) heart failure: Secondary | ICD-10-CM | POA: Diagnosis not present

## 2015-01-02 DIAGNOSIS — Z886 Allergy status to analgesic agent status: Secondary | ICD-10-CM | POA: Diagnosis not present

## 2015-01-02 DIAGNOSIS — I959 Hypotension, unspecified: Secondary | ICD-10-CM | POA: Diagnosis present

## 2015-01-02 DIAGNOSIS — C3491 Malignant neoplasm of unspecified part of right bronchus or lung: Secondary | ICD-10-CM

## 2015-01-02 DIAGNOSIS — Z9981 Dependence on supplemental oxygen: Secondary | ICD-10-CM

## 2015-01-02 DIAGNOSIS — I5023 Acute on chronic systolic (congestive) heart failure: Secondary | ICD-10-CM | POA: Diagnosis present

## 2015-01-02 DIAGNOSIS — D72829 Elevated white blood cell count, unspecified: Secondary | ICD-10-CM | POA: Diagnosis not present

## 2015-01-02 DIAGNOSIS — Z885 Allergy status to narcotic agent status: Secondary | ICD-10-CM

## 2015-01-02 DIAGNOSIS — J9621 Acute and chronic respiratory failure with hypoxia: Secondary | ICD-10-CM | POA: Diagnosis present

## 2015-01-02 DIAGNOSIS — J910 Malignant pleural effusion: Secondary | ICD-10-CM | POA: Diagnosis present

## 2015-01-02 DIAGNOSIS — R0602 Shortness of breath: Secondary | ICD-10-CM | POA: Diagnosis present

## 2015-01-02 DIAGNOSIS — J449 Chronic obstructive pulmonary disease, unspecified: Secondary | ICD-10-CM | POA: Diagnosis present

## 2015-01-02 DIAGNOSIS — R918 Other nonspecific abnormal finding of lung field: Secondary | ICD-10-CM

## 2015-01-02 DIAGNOSIS — J9600 Acute respiratory failure, unspecified whether with hypoxia or hypercapnia: Secondary | ICD-10-CM | POA: Diagnosis not present

## 2015-01-02 DIAGNOSIS — Z87891 Personal history of nicotine dependence: Secondary | ICD-10-CM | POA: Diagnosis not present

## 2015-01-02 DIAGNOSIS — Z7982 Long term (current) use of aspirin: Secondary | ICD-10-CM

## 2015-01-02 DIAGNOSIS — Z79899 Other long term (current) drug therapy: Secondary | ICD-10-CM

## 2015-01-02 DIAGNOSIS — J189 Pneumonia, unspecified organism: Secondary | ICD-10-CM | POA: Diagnosis present

## 2015-01-02 DIAGNOSIS — J9690 Respiratory failure, unspecified, unspecified whether with hypoxia or hypercapnia: Secondary | ICD-10-CM | POA: Diagnosis present

## 2015-01-02 DIAGNOSIS — J9691 Respiratory failure, unspecified with hypoxia: Secondary | ICD-10-CM

## 2015-01-02 DIAGNOSIS — I5021 Acute systolic (congestive) heart failure: Secondary | ICD-10-CM | POA: Diagnosis present

## 2015-01-02 DIAGNOSIS — J9692 Respiratory failure, unspecified with hypercapnia: Secondary | ICD-10-CM

## 2015-01-02 DIAGNOSIS — J441 Chronic obstructive pulmonary disease with (acute) exacerbation: Secondary | ICD-10-CM | POA: Diagnosis present

## 2015-01-02 HISTORY — DX: Malignant pleural effusion: J91.0

## 2015-01-02 LAB — URINALYSIS, ROUTINE W REFLEX MICROSCOPIC
Bilirubin Urine: NEGATIVE
Glucose, UA: NEGATIVE mg/dL
Hgb urine dipstick: NEGATIVE
Ketones, ur: NEGATIVE mg/dL
Nitrite: NEGATIVE
Protein, ur: NEGATIVE mg/dL
Specific Gravity, Urine: 1.033 — ABNORMAL HIGH (ref 1.005–1.030)
Urobilinogen, UA: 0.2 mg/dL (ref 0.0–1.0)
pH: 5.5 (ref 5.0–8.0)

## 2015-01-02 LAB — BLOOD GAS, ARTERIAL
ACID-BASE EXCESS: 18.9 mmol/L — AB (ref 0.0–2.0)
Bicarbonate: 49.3 mEq/L — ABNORMAL HIGH (ref 20.0–24.0)
Drawn by: 422461
O2 Content: 4 L/min
O2 SAT: 92.5 %
PATIENT TEMPERATURE: 98.6
TCO2: 45 mmol/L (ref 0–100)
pCO2 arterial: 95.1 mmHg (ref 35.0–45.0)
pH, Arterial: 7.335 — ABNORMAL LOW (ref 7.350–7.450)
pO2, Arterial: 70.9 mmHg — ABNORMAL LOW (ref 80.0–100.0)

## 2015-01-02 LAB — CBC WITH DIFFERENTIAL/PLATELET
BASOS ABS: 0 10*3/uL (ref 0.0–0.1)
BASOS PCT: 0 % (ref 0–1)
Eosinophils Absolute: 0 10*3/uL (ref 0.0–0.7)
Eosinophils Relative: 0 % (ref 0–5)
HCT: 46.8 % — ABNORMAL HIGH (ref 36.0–46.0)
Hemoglobin: 14.3 g/dL (ref 12.0–15.0)
LYMPHS ABS: 0.4 10*3/uL — AB (ref 0.7–4.0)
Lymphocytes Relative: 2 % — ABNORMAL LOW (ref 12–46)
MCH: 30.9 pg (ref 26.0–34.0)
MCHC: 30.6 g/dL (ref 30.0–36.0)
MCV: 101.1 fL — AB (ref 78.0–100.0)
MONO ABS: 1.3 10*3/uL — AB (ref 0.1–1.0)
MONOS PCT: 6 % (ref 3–12)
NEUTROS ABS: 20.2 10*3/uL — AB (ref 1.7–7.7)
Neutrophils Relative %: 92 % — ABNORMAL HIGH (ref 43–77)
Platelets: 544 10*3/uL — ABNORMAL HIGH (ref 150–400)
RBC: 4.63 MIL/uL (ref 3.87–5.11)
RDW: 12.9 % (ref 11.5–15.5)
WBC: 22 10*3/uL — ABNORMAL HIGH (ref 4.0–10.5)

## 2015-01-02 LAB — BASIC METABOLIC PANEL
ANION GAP: 9 (ref 5–15)
BUN: 9 mg/dL (ref 6–20)
CHLORIDE: 79 mmol/L — AB (ref 101–111)
CO2: 45 mmol/L — AB (ref 22–32)
CREATININE: 0.46 mg/dL (ref 0.44–1.00)
Calcium: 9.6 mg/dL (ref 8.9–10.3)
GFR calc Af Amer: >60 mL/min (ref >60)
GFR calc non Af Amer: >60 mL/min (ref >60)
GLUCOSE: 148 mg/dL — AB (ref 65–99)
POTASSIUM: 4.5 mmol/L (ref 3.5–5.1)
SODIUM: 133 mmol/L — AB (ref 135–145)

## 2015-01-02 LAB — BRAIN NATRIURETIC PEPTIDE: B Natriuretic Peptide: 100.5 pg/mL — ABNORMAL HIGH (ref 0.0–100.0)

## 2015-01-02 LAB — I-STAT CG4 LACTIC ACID, ED: Lactic Acid, Venous: 0.75 mmol/L (ref 0.5–2.0)

## 2015-01-02 LAB — URINE MICROSCOPIC-ADD ON

## 2015-01-02 LAB — I-STAT TROPONIN, ED: Troponin i, poc: 0.01 ng/mL (ref 0.00–0.08)

## 2015-01-02 MED ORDER — LEVALBUTEROL HCL 0.63 MG/3ML IN NEBU
0.6300 mg | INHALATION_SOLUTION | Freq: Four times a day (QID) | RESPIRATORY_TRACT | Status: DC
  Administered 2015-01-02 – 2015-01-03 (×2): 0.63 mg via RESPIRATORY_TRACT
  Filled 2015-01-02 (×2): qty 3

## 2015-01-02 MED ORDER — SODIUM CHLORIDE 0.9 % IV BOLUS (SEPSIS)
250.0000 mL | Freq: Once | INTRAVENOUS | Status: AC
  Administered 2015-01-02: 250 mL via INTRAVENOUS

## 2015-01-02 MED ORDER — PIPERACILLIN-TAZOBACTAM 3.375 G IVPB
3.3750 g | Freq: Three times a day (TID) | INTRAVENOUS | Status: DC
  Administered 2015-01-03 (×2): 3.375 g via INTRAVENOUS
  Filled 2015-01-02 (×2): qty 50

## 2015-01-02 MED ORDER — VANCOMYCIN HCL IN DEXTROSE 1-5 GM/200ML-% IV SOLN
1000.0000 mg | Freq: Once | INTRAVENOUS | Status: AC
  Administered 2015-01-02: 1000 mg via INTRAVENOUS
  Filled 2015-01-02: qty 200

## 2015-01-02 MED ORDER — PIPERACILLIN-TAZOBACTAM 3.375 G IVPB 30 MIN
3.3750 g | Freq: Once | INTRAVENOUS | Status: AC
  Administered 2015-01-02: 3.375 g via INTRAVENOUS
  Filled 2015-01-02: qty 50

## 2015-01-02 MED ORDER — IPRATROPIUM BROMIDE 0.02 % IN SOLN
0.5000 mg | RESPIRATORY_TRACT | Status: DC
  Administered 2015-01-02 – 2015-01-03 (×2): 0.5 mg via RESPIRATORY_TRACT
  Filled 2015-01-02 (×2): qty 2.5

## 2015-01-02 MED ORDER — VANCOMYCIN HCL IN DEXTROSE 750-5 MG/150ML-% IV SOLN
750.0000 mg | Freq: Two times a day (BID) | INTRAVENOUS | Status: DC
  Administered 2015-01-03: 750 mg via INTRAVENOUS
  Filled 2015-01-02 (×2): qty 150

## 2015-01-02 NOTE — Progress Notes (Signed)
Patient ID: Linda Donovan, female   DOB: 1956-01-07, 59 y.o.   MRN: [id number] Patient presented to ultrasound department today for outpatient right thoracentesis. Prior to starting exam systolic blood pressure readings were in the 80s to 90s. Patient is on diuretic therapy and has felt weak and lightheaded at times. Case discussed with Dr. Earlie Server and he advised that patient be sent to ED for further evaluation. Thoracentesis postponed for now. Plans discussed with patient and family.

## 2015-01-02 NOTE — ED Provider Notes (Signed)
CSN: [id number]     Arrival date & time 01/02/15  1632 History   First MD Initiated Contact with Patient 01/02/15 1701     Chief Complaint  Patient presents with  . Shortness of Breath  . Hypoxia      (Consider location/radiation/quality/duration/timing/severity/associated sxs/prior Treatment) HPI Comments: Patient complex medical history including non-small cell carcinoma of the lung, COPD, CHF and recurrent pleural effusions presents with shortness of breath and hypotension. She's had 3 past thoracentesis for recurrent pleural effusions. The last was done on July 20. Her husband called this morning to her oncologist noting that she's had some increased shortness of breath since yesterday and felt that she needed another paracentesis. An x-ray was performed just a couple hours ago in the radiology suite which showed of recurrence of a large pleural effusion. She was about to have a thoracentesis however she was noted to be hypotensive with a blood pressure in 80s. She was sent to the ED for further evaluation. The thoracentesis was not done. In triage she was noted to have an oxygen saturation of 41%. She has ongoing coughing which is not significantly different than her baseline. She denies any fevers. She does have some mild leg swelling, right greater than left but she said this is also at baseline.  Patient is a 59 y.o. female presenting with shortness of breath.  Shortness of Breath Associated symptoms: cough   Associated symptoms: no abdominal pain, no chest pain, no diaphoresis, no fever, no headaches, no rash and no vomiting     Past Medical History  Diagnosis Date  . COPD (chronic obstructive pulmonary disease)   . ADD (attention deficit disorder)   . Hx of radiation therapy 09/28/12- 11/17/12    RU lung mass, mediastinum chest, 63 gray 35 fx  . Pneumonia   . Arthritis   . Non-small cell lung cancer 09/18/2012    RUL  . Asthma    Past Surgical History  Procedure Laterality Date   . Total abdominal hysterectomy    . Shoulder arthroscopy    . Video bronchoscopy Bilateral 07/25/2012    Procedure: VIDEO BRONCHOSCOPY WITH FLUORO;  Surgeon: Tanda Rockers, MD;  Location: [facility];  Service: Endoscopy;  Laterality: Bilateral;  . Lump removed from breasr right  2003  . Endobronchial ultrasound Bilateral 09/04/2012    Procedure: ENDOBRONCHIAL ULTRASOUND;  Surgeon: Collene Gobble, MD;  Location: [facility];  Service: Cardiopulmonary;  Laterality: Bilateral;  . Breast surgery    . Subxyphoid pericardial window N/A 01/04/2014    Procedure: SUBXYPHOID PERICARDIAL WINDOW;  Surgeon: [name], MD;  Location: [place];  Service: Thoracic;  Laterality: N/A;  . Intraoperative transesophageal echocardiogram N/A 01/04/2014    Procedure: INTRAOPERATIVE TRANSESOPHAGEAL ECHOCARDIOGRAM;  Surgeon: [name], MD;  Location: [place];  Service: Open Heart Surgery;  Laterality: N/A;   Family History  Problem Relation Age of Onset  . COPD Paternal Uncle   . Heart disease Mother   . Heart disease Maternal Grandmother   . Cancer Paternal Grandmother     breast  . Prostate cancer Maternal Grandfather   . Cancer Maternal Grandfather     prostate   History  Substance Use Topics  . Smoking status: Former Smoker -- 1.00 packs/day for 20 years    Types: Cigarettes    Quit date: 06/07/1996  . Smokeless tobacco: Never Used  . Alcohol Use: 0.6 oz/week    1 Glasses of wine per week     Comment:  one glass of wine "a few times per week"   OB History    No data available     Review of Systems  Constitutional: Positive for fatigue. Negative for fever, chills and diaphoresis.  HENT: Negative for congestion, rhinorrhea and sneezing.   Eyes: Negative.   Respiratory: Positive for cough and shortness of breath. Negative for chest tightness.   Cardiovascular: Positive for leg swelling. Negative for chest pain.  Gastrointestinal: Negative for nausea, vomiting, abdominal pain,  diarrhea and blood in stool.  Genitourinary: Negative for frequency, hematuria, flank pain and difficulty urinating.  Musculoskeletal: Negative for back pain and arthralgias.  Skin: Negative for rash.  Neurological: Positive for light-headedness. Negative for dizziness, speech difficulty, weakness, numbness and headaches.      Allergies  Clinoril and Morphine  Home Medications   Prior to Admission medications   Medication Sig Start Date End Date Taking? Authorizing Provider  ALPRAZolam Duanne Moron) 1 MG tablet Take 1 tablet (1 mg total) by mouth 3 (three) times daily. 09/21/14  Yes Reyne Dumas, MD  amitriptyline (ELAVIL) 50 MG tablet Take 1 tablet (50 mg total) by mouth daily. 12/30/14  Yes Curt Bears, MD  aspirin 81 MG tablet Take 81 mg by mouth at bedtime.    Yes Historical Provider, MD  benzonatate (TESSALON) 200 MG capsule Take 200 mg by mouth 3 (three) times daily.  12/14/14  Yes Historical Provider, MD  Besifloxacin HCl (BESIVANCE) 0.6 % SUSP Place 1 drop into the left eye 2 (two) times daily.   Yes Historical Provider, MD  Bromfenac Sodium (PROLENSA) 0.07 % SOLN Place 1 drop into the left eye daily.   Yes Historical Provider, MD  dextromethorphan-guaiFENesin (MUCINEX DM) 30-600 MG per 12 hr tablet Take 1 tablet by mouth 2 (two) times daily as needed for cough.   Yes Historical Provider, MD  Difluprednate (DUREZOL) 0.05 % EMUL Place 1 drop into the left eye 4 (four) times daily.   Yes Historical Provider, MD  digoxin (LANOXIN) 0.125 MG tablet Take 1 tablet (0.125 mg total) by mouth daily. 11/25/14  Yes Larey Dresser, MD  docusate sodium (COLACE) 100 MG capsule Take 1 capsule (100 mg total) by mouth 2 (two) times daily. 09/21/14  Yes Reyne Dumas, MD  folic acid (FOLVITE) 1 MG tablet Take 1 tablet (1 mg total) by mouth daily. 08/13/14  Yes Curt Bears, MD  furosemide (LASIX) 40 MG tablet Take 1 tablet (40 mg total) by mouth daily. May give extra dose 2-3 days  a week for edema. 12/27/14   Yes Curt Bears, MD  HYDROcodone-homatropine [facility]) 5-1.5 MG/5ML syrup Take 5 mLs by mouth every 6 (six) hours as needed for cough. 01/01/15  Yes Adrena E Johnson, PA-C  ipratropium (ATROVENT) 0.02 % nebulizer solution Take 2.5 mLs (0.5 mg total) by nebulization 4 (four) times daily. 09/21/14  Yes Reyne Dumas, MD  levalbuterol (XOPENEX HFA) 45 MCG/ACT inhaler Inhale 2 puffs into the lungs every 4 (four) hours as needed for wheezing. Patient taking differently: Inhale 2 puffs into the lungs 4 (four) times daily.  12/31/14  Yes Collene Gobble, MD  levalbuterol (XOPENEX) 0.63 MG/3ML nebulizer solution Take 3 mLs (0.63 mg total) by nebulization 4 (four) times daily. 12/31/14  Yes Collene Gobble, MD  losartan (COZAAR) 25 MG tablet Take 0.5 tablets (12.5 mg total) by mouth daily. 11/25/14  Yes Larey Dresser, MD  metoprolol succinate (TOPROL-XL) 25 MG 24 hr tablet Take 25 mg by mouth daily. 08/16/14  Yes  Historical Provider, MD  polyethylene glycol (MIRALAX / GLYCOLAX) packet Take 17 g by mouth daily as needed for mild constipation or moderate constipation. 09/21/14  Yes Reyne Dumas, MD  potassium chloride SA (K-DUR,KLOR-CON) 20 MEQ tablet TAKE 1 TABLET BY MOUTH EVERY DAY 12/02/14  Yes Jolaine Artist, MD  predniSONE (DELTASONE) 10 MG tablet Take 10 mg by mouth daily. 11/05/14  Yes Historical Provider, MD  SENOKOT S 8.6-50 MG per tablet Take 1 tablet by mouth at bedtime.  11/05/14  Yes Historical Provider, MD  spironolactone (ALDACTONE) 25 MG tablet Take 0.5 tablets (12.5 mg total) by mouth daily. 11/25/14  Yes Larey Dresser, MD  sucralfate (CARAFATE) 1 G tablet Take 1 tablet (1 g total) by mouth 4 (four) times daily -  with meals and at bedtime. Dissolve in 10 ml of water prior to taking 09/10/14  Yes Gery Pray, MD  traZODone (DESYREL) 50 MG tablet Take 50 mg by mouth at bedtime.  11/06/14  Yes Historical Provider, MD  Fluticasone Furoate-Vilanterol (BREO ELLIPTA) 100-25 MCG/INH AEPB Inhale 1 puff into  the lungs daily. 12/31/14   Collene Gobble, MD  oxyCODONE-acetaminophen (PERCOCET/ROXICET) 5-325 MG per tablet Take 1 tablet by mouth every 4 (four) hours as needed for moderate pain or severe pain.  10/09/14   Historical Provider, MD  OXYGEN Inhale 4 L into the lungs continuous.     Historical Provider, MD   BP 106/59 mmHg  Pulse 106  Temp(Src) 98.9 F (37.2 C) (Oral)  Resp 26  SpO2 41% Physical Exam  Constitutional: She is oriented to person, place, and time. She appears well-developed and well-nourished.  Patient appears ashen  HENT:  Head: Normocephalic and atraumatic.  Eyes: Pupils are equal, round, and reactive to light.  Neck: Normal range of motion. Neck supple.  Cardiovascular: Normal rate, regular rhythm and normal heart sounds.   Pulmonary/Chest: Effort normal. Tachypnea noted. No respiratory distress. She has no wheezes. She has rales. She exhibits no tenderness.  Diminished breath sounds and some rhonchi on the right  Abdominal: Soft. Bowel sounds are normal. There is no tenderness. There is no rebound and no guarding.  Musculoskeletal: Normal range of motion. She exhibits edema (Right greater than left, similar to baseline per pt).  Lymphadenopathy:    She has no cervical adenopathy.  Neurological: She is alert and oriented to person, place, and time.  Skin: Skin is warm and dry. No rash noted.  Psychiatric: She has a normal mood and affect.    ED Course  Procedures (including critical care time) Labs Review Labs Reviewed  BASIC METABOLIC PANEL - Abnormal; Notable for the following:    Sodium 133 (*)    Chloride 79 (*)    CO2 45 (*)    Glucose, Bld 148 (*)    All other components within normal limits  BRAIN NATRIURETIC PEPTIDE - Abnormal; Notable for the following:    B Natriuretic Peptide 100.5 (*)    All other components within normal limits  CBC WITH DIFFERENTIAL/PLATELET - Abnormal; Notable for the following:    WBC 22.0 (*)    HCT 46.8 (*)    MCV 101.1 (*)     Platelets 544 (*)    Neutrophils Relative % 92 (*)    Neutro Abs 20.2 (*)    Lymphocytes Relative 2 (*)    Lymphs Abs 0.4 (*)    Monocytes Absolute 1.3 (*)    All other components within normal limits  BLOOD GAS, ARTERIAL  I-STAT  [place], ED  I-STAT CG4 LACTIC ACID, ED    Imaging Review Dg Chest 2 View  01/02/2015   CLINICAL DATA:  History of lung carcinoma, short of breath over the last several days, history of recurring pleural effusions  EXAM: CHEST  2 VIEW  COMPARISON:  Chest x-ray of 12/25/2014 and CT chest of 11/26/2014  FINDINGS: There is now more opacity at the right lung base most consistent with recurrent right pleural effusion and postoperative change with volume loss. Poor aeration of the right upper lung field again is noted. The left lung is clear and somewhat hyperaerated. Heart size is stable. No bony abnormality is seen.  IMPRESSION: More opacity at the right lung base most consistent with increasing right pleural effusion.   Electronically Signed   By: Ivar Drape M.D.   On: 01/02/2015 14:25   [place] Chest  01/02/2015   CLINICAL DATA:  Right pleural effusion.  EXAM: CHEST ULTRASOUND  COMPARISON:  Chest x-ray dated 01/02/2015  FINDINGS: There is a large pocket of pleural fluid seen posteriorly in the right hemi thorax. However, because of hypotension, thoracentesis was not performed.  IMPRESSION: There is a large pocket of pleural fluid in the right hemi thorax. Thoracentesis not performed because of hypotension.   Electronically Signed   By: Lorriane Shire M.D.   On: 01/02/2015 17:04     EKG Interpretation   Date/Time:  Thursday January 02 2015 17:01:58 EDT Ventricular Rate:  106 PR Interval:    QRS Duration: 71 QT Interval:  404 QTC Calculation: 536 R Axis:   88 Text Interpretation:  Sinus tachycardia T wave inversion inferiorly  Borderline right axis deviation Borderline T wave abnormalities Prolonged  QT interval Confirmed by Parnika Tweten  MD, Rocket Gunderson ([id number]) on 01/02/2015  7:07:57  PM      MDM   Final diagnoses:  Pleural effusion  Hypoxia    Patient with a history of lung cancer and COPD presents with marked hypoxia and reported hypotension. She's had recurrent pleural effusions and on review for x-ray today she has a large pleural effusion that has increased since her last thoracentesis. She's afebrile but does have an elevated white count of 22,000. She was markedly hypoxic on arrival with an oxygen saturation of 41% on her nasal cannula. She was placed on a nonrebreather mask. Her color improved and her oxygenation improved to the mid 90s. Her blood pressures been normal in the ED. She was given a very small amount of IV fluids.  I did go ahead and give her IV antibiotics given her increase in WBC count and large pleural effusion. I did notify Dr. Alroy Dust with radiology that the patient will ultimately need a thoracentesis and Dr. Alroy Dust will notify the IR team for it to be done likely tomorrow.  I did speak with Dr. Frederic Jericho who feels  that the patient would benefit from being admitted by CCM.  I spoke with Dr. Jeryl Columbia with CCM. He did not feel the patient needed ICU admission. Her blood pressures been stable since she's arrived to the ED. Her oxygen saturations have been stable. She's recently transitioned to a nasal cannula at 4 L/m. I did check an ABG which showed an elevated PCO2 and 95. She doesn't have significant acidosis. Patient is mentating normally. Dr. Madalyn Rob recommended keeping her oxygen saturations between 88 and 90%. I will decrease her oxygen to 2 L/m. I will get in contact with Dr. Tyrell Antonio for admission  CRITICAL CARE Performed by: Ronney Honeywell Total critical  care time: 45 Critical care time was exclusive of separately billable procedures and treating other patients. Critical care was necessary to treat or prevent imminent or life-threatening deterioration. Critical care was time spent personally by me on the following activities:  development of treatment plan with patient and/or surrogate as well as nursing, discussions with consultants, evaluation of patient's response to treatment, examination of patient, obtaining history from patient or surrogate, ordering and performing treatments and interventions, ordering and review of laboratory studies, ordering and review of radiographic studies, pulse oximetry and re-evaluation of patient's condition.     Malvin Johns, MD 01/02/15 2010

## 2015-01-02 NOTE — Addendum Note (Signed)
Addended by: Ardeen Garland on: 01/02/2015 01:41 PM   Modules accepted: Orders

## 2015-01-02 NOTE — Telephone Encounter (Signed)
Spouse Nicole Kindred called.  "I think my wife needs another Thoracentesis.  She's having difficulty breathing, fatigued and a lot of problems."  Return number [id number].

## 2015-01-02 NOTE — ED Notes (Signed)
Per family, states they were in radiology for a paracentesis but patient became SOB and hypotensive-

## 2015-01-02 NOTE — Telephone Encounter (Signed)
Linda Donovan in radiology called -pt hypotensive with systolic 87. Dr Julien Nordmann cancelled thoracentesis and instructed Linda Donovan to send pt to ED.

## 2015-01-02 NOTE — Telephone Encounter (Signed)
We will need to refer her to Cardiothoracic surgery for PleurX placement.

## 2015-01-02 NOTE — Progress Notes (Signed)
ANTIBIOTIC CONSULT NOTE - INITIAL  Pharmacy Consult for Vancomycin, Zosyn Indication: rule out pneumonia  Allergies  Allergen Reactions  . Clinoril [Sulindac] Hives  . Morphine Itching    Patient Measurements:   Weight 58.6 kg (12/26/14) Height 63 in  Vital Signs: Temp: 98.9 F (37.2 C) (07/28 1705) Temp Source: Oral (07/28 1705) BP: 108/60 mmHg (07/28 2100) Pulse Rate: 99 (07/28 2100) Intake/Output from previous day:   Intake/Output from this shift: Total I/O In: 250 [IV Piggyback:250] Out: -   Labs:  Recent Labs  01/02/15 1700  WBC 22.0*  HGB 14.3  PLT 544*  CREATININE 0.46   Estimated Creatinine Clearance: 61.9 mL/min (by C-G formula based on Cr of 0.46). No results for input(s): VANCOTROUGH, VANCOPEAK, VANCORANDOM, GENTTROUGH, GENTPEAK, GENTRANDOM, TOBRATROUGH, TOBRAPEAK, TOBRARND, AMIKACINPEAK, AMIKACINTROU, AMIKACIN in the last 72 hours.   Microbiology: Recent Results (from the past 720 hour(s))  Body fluid culture     Status: None   Collection Time: 12/07/14 10:47 AM  Result Value Ref Range Status   Specimen Description PLEURAL RIGHT  Final   Special Requests NONE  Final   Gram Stain   Final    NO ORGANISMS SEEN WBC PRESENT,BOTH PMN AND MONONUCLEAR CYTOSPIN SMEAR    Culture   Final    NO GROWTH 3 DAYS Performed at [facility]    Report Status 12/11/2014 FINAL  Final  Culture, sputum-assessment     Status: None   Collection Time: 12/07/14 12:00 PM  Result Value Ref Range Status   Specimen Description SPUTUM  Final   Special Requests Immunocompromised  Final   Sputum evaluation   Final    THIS SPECIMEN IS ACCEPTABLE. RESPIRATORY CULTURE REPORT TO FOLLOW.   Report Status 12/07/2014 FINAL  Final  Culture, respiratory (NON-Expectorated)     Status: None   Collection Time: 12/07/14 12:00 PM  Result Value Ref Range Status   Specimen Description SPUTUM  Final   Special Requests NONE  Final   Gram Stain   Final    NO WBC SEEN RARE  SQUAMOUS EPITHELIAL CELLS PRESENT FEW YEAST RARE GRAM POSITIVE RODS Performed at [facility]    Culture   Final    MODERATE CANDIDA ALBICANS Performed at [facility]    Report Status 12/09/2014 FINAL  Final    Medical History: Past Medical History  Diagnosis Date  . COPD (chronic obstructive pulmonary disease)   . ADD (attention deficit disorder)   . Hx of radiation therapy 09/28/12- 11/17/12    RU lung mass, mediastinum chest, 63 gray 35 fx  . Pneumonia   . Arthritis   . Non-small cell lung cancer 09/18/2012    RUL  . Asthma     Medications:  Anti-infectives    Start     Dose/Rate Route Frequency Ordered Stop   01/02/15 1915  piperacillin-tazobactam (ZOSYN) IVPB 3.375 g     3.375 g 100 mL/hr over 30 Minutes Intravenous  Once 01/02/15 1904 01/02/15 2002   01/02/15 1915  vancomycin (VANCOCIN) IVPB 1000 mg/200 mL premix     1,000 mg 200 mL/hr over 60 Minutes Intravenous  Once 01/02/15 1904 01/02/15 2108     Assessment: 31 yoF admitted 7/28 with SOB and hypoxia with concern for pneumonia.  PMH includes COPD, NSCLC, recent thoracentesis.  Pharmacy is consulted to dose vancomycin and Zosyn.  7/28 >> Vanc >> 7/28 >> Zosyn >>     Today, 01/02/2015:   Tm 98.9  WBC 22  SCr 0.46,  CrCl ~ 62 ml/min CG   Blood and urine cultures pending   Goal of Therapy:  Vancomycin trough level 15-20 mcg/ml Appropriate abx dosing, eradication of infection.   Plan:   Zosyn 3.375g IV Q8H infused over 4hrs.   Vancomycin 1g IV x1 dose, then '750mg'$  IV q12h.  Measure Vanc trough at steady state.  Follow up renal fxn, culture results, and clinical course.   Gretta Arab PharmD, BCPS Pager [phone] 01/02/2015 9:22 PM

## 2015-01-02 NOTE — Telephone Encounter (Signed)
Husband stated Linda Donovan is in radiology get thoracentesis.

## 2015-01-02 NOTE — Telephone Encounter (Addendum)
Called [facility] and referred pt-husband notified. appt for [facility] given to husband . CXR ordered for today-husband states her breathing is worse.

## 2015-01-02 NOTE — H&P (Signed)
[facility] History and Physical  Linda Donovan IHK:[id number] DOB: 12-10-1955 DOA: 01/02/2015  Referring physician: Dr Tamera Punt PCP: Pcp Not In System   Chief Complaint: SOB  HPI: Linda Donovan is a 59 y.o. female with PMH significant for COPD gold III, Non small Lung cell cancer, follows with Dr Julien Nordmann presents with worsening SOB. Patient was refer to ED by IR department for further evaluation. Patient presented to IR for outpatient thoracentesis. Procedure was not performed because patient SBP drop in the 80 and 90.  Patient has been having worsening  dyspnea. Lasix has not been helping. She has had 3 thoracentesis in the past.   Evaluation: Chest xray : More opacity at the right lung base most consistent with increasing right pleural effusion. PH at 7.33, CO2 at 95, po 2 at 70. WBC at 22, BNP at 100.   Review of Systems:  Negative, except asper HPI.   Past Medical History  Diagnosis Date  . COPD (chronic obstructive pulmonary disease)   . ADD (attention deficit disorder)   . Hx of radiation therapy 09/28/12- 11/17/12    RU lung mass, mediastinum chest, 63 gray 35 fx  . Pneumonia   . Arthritis   . Non-small cell lung cancer 09/18/2012    RUL  . Asthma    Past Surgical History  Procedure Laterality Date  . Total abdominal hysterectomy    . Shoulder arthroscopy    . Video bronchoscopy Bilateral 07/25/2012    Procedure: VIDEO BRONCHOSCOPY WITH FLUORO;  Surgeon: Tanda Rockers, MD;  Location: [facility];  Service: Endoscopy;  Laterality: Bilateral;  . Lump removed from breasr right  2003  . Endobronchial ultrasound Bilateral 09/04/2012    Procedure: ENDOBRONCHIAL ULTRASOUND;  Surgeon: Collene Gobble, MD;  Location: [facility];  Service: Cardiopulmonary;  Laterality: Bilateral;  . Breast surgery    . Subxyphoid pericardial window N/A 01/04/2014    Procedure: SUBXYPHOID PERICARDIAL WINDOW;  Surgeon: [name], MD;  Location: [place];  Service: Thoracic;  Laterality:  N/A;  . Intraoperative transesophageal echocardiogram N/A 01/04/2014    Procedure: INTRAOPERATIVE TRANSESOPHAGEAL ECHOCARDIOGRAM;  Surgeon: [name], MD;  Location: [place];  Service: Open Heart Surgery;  Laterality: N/A;   Social History:  reports that she quit smoking about 18 years ago. Her smoking use included Cigarettes. She has a 20 pack-year smoking history. She has never used smokeless tobacco. She reports that she drinks about 0.6 oz of alcohol per week. She reports that she does not use illicit drugs.  Allergies  Allergen Reactions  . Clinoril [Sulindac] Hives  . Morphine Itching    Family History  Problem Relation Age of Onset  . COPD Paternal Uncle   . Heart disease Mother   . Heart disease Maternal Grandmother   . Cancer Paternal Grandmother     breast  . Prostate cancer Maternal Grandfather   . Cancer Maternal Grandfather     prostate    Prior to Admission medications   Medication Sig Start Date End Date Taking? Authorizing Provider  ALPRAZolam Duanne Moron) 1 MG tablet Take 1 tablet (1 mg total) by mouth 3 (three) times daily. 09/21/14  Yes Reyne Dumas, MD  amitriptyline (ELAVIL) 50 MG tablet Take 1 tablet (50 mg total) by mouth daily. 12/30/14  Yes Curt Bears, MD  aspirin 81 MG tablet Take 81 mg by mouth at bedtime.    Yes Historical Provider, MD  benzonatate (TESSALON) 200 MG capsule Take 200 mg by mouth 3 (three)  times daily.  12/14/14  Yes Historical Provider, MD  Besifloxacin HCl (BESIVANCE) 0.6 % SUSP Place 1 drop into the left eye 2 (two) times daily.   Yes Historical Provider, MD  Bromfenac Sodium (PROLENSA) 0.07 % SOLN Place 1 drop into the left eye daily.   Yes Historical Provider, MD  dextromethorphan-guaiFENesin (MUCINEX DM) 30-600 MG per 12 hr tablet Take 1 tablet by mouth 2 (two) times daily as needed for cough.   Yes Historical Provider, MD  Difluprednate (DUREZOL) 0.05 % EMUL Place 1 drop into the left eye 4 (four) times daily.   Yes Historical  Provider, MD  digoxin (LANOXIN) 0.125 MG tablet Take 1 tablet (0.125 mg total) by mouth daily. 11/25/14  Yes Larey Dresser, MD  docusate sodium (COLACE) 100 MG capsule Take 1 capsule (100 mg total) by mouth 2 (two) times daily. 09/21/14  Yes Reyne Dumas, MD  folic acid (FOLVITE) 1 MG tablet Take 1 tablet (1 mg total) by mouth daily. 08/13/14  Yes Curt Bears, MD  furosemide (LASIX) 40 MG tablet Take 1 tablet (40 mg total) by mouth daily. May give extra dose 2-3 days  a week for edema. 12/27/14  Yes Curt Bears, MD  HYDROcodone-homatropine [facility]) 5-1.5 MG/5ML syrup Take 5 mLs by mouth every 6 (six) hours as needed for cough. 01/01/15  Yes Adrena E Johnson, PA-C  ipratropium (ATROVENT) 0.02 % nebulizer solution Take 2.5 mLs (0.5 mg total) by nebulization 4 (four) times daily. 09/21/14  Yes Reyne Dumas, MD  levalbuterol (XOPENEX HFA) 45 MCG/ACT inhaler Inhale 2 puffs into the lungs every 4 (four) hours as needed for wheezing. Patient taking differently: Inhale 2 puffs into the lungs 4 (four) times daily.  12/31/14  Yes Collene Gobble, MD  levalbuterol (XOPENEX) 0.63 MG/3ML nebulizer solution Take 3 mLs (0.63 mg total) by nebulization 4 (four) times daily. 12/31/14  Yes Collene Gobble, MD  losartan (COZAAR) 25 MG tablet Take 0.5 tablets (12.5 mg total) by mouth daily. 11/25/14  Yes Larey Dresser, MD  metoprolol succinate (TOPROL-XL) 25 MG 24 hr tablet Take 25 mg by mouth daily. 08/16/14  Yes Historical Provider, MD  polyethylene glycol (MIRALAX / GLYCOLAX) packet Take 17 g by mouth daily as needed for mild constipation or moderate constipation. 09/21/14  Yes Reyne Dumas, MD  potassium chloride SA (K-DUR,KLOR-CON) 20 MEQ tablet TAKE 1 TABLET BY MOUTH EVERY DAY 12/02/14  Yes Jolaine Artist, MD  predniSONE (DELTASONE) 10 MG tablet Take 10 mg by mouth daily. 11/05/14  Yes Historical Provider, MD  SENOKOT S 8.6-50 MG per tablet Take 1 tablet by mouth at bedtime.  11/05/14  Yes Historical Provider, MD    spironolactone (ALDACTONE) 25 MG tablet Take 0.5 tablets (12.5 mg total) by mouth daily. 11/25/14  Yes Larey Dresser, MD  sucralfate (CARAFATE) 1 G tablet Take 1 tablet (1 g total) by mouth 4 (four) times daily -  with meals and at bedtime. Dissolve in 10 ml of water prior to taking 09/10/14  Yes Gery Pray, MD  traZODone (DESYREL) 50 MG tablet Take 50 mg by mouth at bedtime.  11/06/14  Yes Historical Provider, MD  Fluticasone Furoate-Vilanterol (BREO ELLIPTA) 100-25 MCG/INH AEPB Inhale 1 puff into the lungs daily. 12/31/14   Collene Gobble, MD  oxyCODONE-acetaminophen (PERCOCET/ROXICET) 5-325 MG per tablet Take 1 tablet by mouth every 4 (four) hours as needed for moderate pain or severe pain.  10/09/14   Historical Provider, MD  OXYGEN Inhale 4 L  into the lungs continuous.     Historical Provider, MD   Physical Exam: Filed Vitals:   01/02/15 1830 01/02/15 1900 01/02/15 1910 01/02/15 2000  BP: 114/70 111/67 120/61 108/64  Pulse: 101 97 100 95  Temp:      TempSrc:      Resp: '20 21 20 17  '$ SpO2: 100% 100% 100% 95%    Wt Readings from Last 3 Encounters:  12/26/14 58.605 kg (129 lb 3.2 oz)  12/06/14 61 kg (134 lb 7.7 oz)  12/06/14 61.054 kg (134 lb 9.6 oz)    General:  Appears calm, tachypnea.  Eyes: PERRL, normal lids, irises & conjunctiva ENT: grossly normal hearing, lips & tongue Neck: no LAD, masses or thyromegaly Cardiovascular: RRR, no m/r/g. No LE edema. Telemetry: SR, no arrhythmias  Respiratory: crackles right side, tachypnea. No wheezing.  Abdomen: soft, ntnd Skin: no rash or induration seen on limited exam Musculoskeletal: grossly normal tone BUE/BLE Psychiatric: grossly normal mood and affect, speech fluent and appropriate Neurologic: grossly non-focal.          Labs on Admission:  Basic Metabolic Panel:  Recent Labs Lab 01/02/15 1700  NA 133*  K 4.5  CL 79*  CO2 45*  GLUCOSE 148*  BUN 9  CREATININE 0.46  CALCIUM 9.6   Liver Function Tests: No results for  input(s): AST, ALT, ALKPHOS, BILITOT, PROT, ALBUMIN in the last 168 hours. No results for input(s): LIPASE, AMYLASE in the last 168 hours. No results for input(s): AMMONIA in the last 168 hours. CBC:  Recent Labs Lab 01/02/15 1700  WBC 22.0*  NEUTROABS 20.2*  HGB 14.3  HCT 46.8*  MCV 101.1*  PLT 544*   Cardiac Enzymes: No results for input(s): CKTOTAL, CKMB, CKMBINDEX, TROPONINI in the last 168 hours.  BNP (last 3 results)  Recent Labs  09/16/14 0520 12/06/14 1404 01/02/15 1700  BNP 824.7* 113.9* 100.5*    ProBNP (last 3 results)  Recent Labs  01/03/14 1338  PROBNP 149.8*    CBG: No results for input(s): GLUCAP in the last 168 hours.  Radiological Exams on Admission: Dg Chest 2 View  01/02/2015   CLINICAL DATA:  History of lung carcinoma, short of breath over the last several days, history of recurring pleural effusions  EXAM: CHEST  2 VIEW  COMPARISON:  Chest x-ray of 12/25/2014 and CT chest of 11/26/2014  FINDINGS: There is now more opacity at the right lung base most consistent with recurrent right pleural effusion and postoperative change with volume loss. Poor aeration of the right upper lung field again is noted. The left lung is clear and somewhat hyperaerated. Heart size is stable. No bony abnormality is seen.  IMPRESSION: More opacity at the right lung base most consistent with increasing right pleural effusion.   Electronically Signed   By: Ivar Drape M.D.   On: 01/02/2015 14:25   [place] Chest  01/02/2015   CLINICAL DATA:  Right pleural effusion.  EXAM: CHEST ULTRASOUND  COMPARISON:  Chest x-ray dated 01/02/2015  FINDINGS: There is a large pocket of pleural fluid seen posteriorly in the right hemi thorax. However, because of hypotension, thoracentesis was not performed.  IMPRESSION: There is a large pocket of pleural fluid in the right hemi thorax. Thoracentesis not performed because of hypotension.   Electronically Signed   By: Lorriane Shire M.D.   On: 01/02/2015  17:04    EKG: Independently reviewed. Sinus tachycardia, T wave inversion.   Assessment/Plan Active Problems:   COPD  GOLD  III   Non-small cell lung cancer   Acute systolic CHF (congestive heart failure)   Chronic systolic heart failure   Pleural effusion   Respiratory failure with hypoxia and hypercapnia   Leukocytosis   Respiratory failure  1-Acute on chronic  hypoxic , hypercapnic Respiratory failure: In the setting of Right side pleural effusion and Lung cancer, COPD.  Patient alert, Ph at 7.3, respiratory acidosis compensated. Avoid oversedation. Keep Oxygen sat 88 to 90 %.  CCM consulted for thoracentesis.  Nebulizer treatments xopenex and ipratropium.  Due to leukocytosis will add coverage for PNA.  On prednisone.   2-Leukocytosis: Will cover for PNA. Check UA, urine culture, blood culture.   3-Non small lung cancer: Added Dr Julien Nordmann to rounding team.   4-Hypotension;  Hold diuretics:lasix, spironolactone,  Metoprolol order withholder parameters.  If hypotension reoccurs will start stress dose steroid.   5-Chronic Diastolic HF;  Holding lasix due to hypotension,. BNP 100 range,.  On digoxin.   6-Anxiety: continue with xanax but PRN, hold for sedation.   Code Status: partial code. No intubation, yes to medications, BIPAP. See order.   DVT Prophylaxis: SCD Family Communication: care discussed with husband who was at bedside.  Disposition Plan: expect 2 to 3 days inpatient.   Time spent: 75 minutes.   Niel Hummer A [facility] Pager [phone]

## 2015-01-03 ENCOUNTER — Encounter (HOSPITAL_COMMUNITY): Payer: Self-pay | Admitting: *Deleted

## 2015-01-03 ENCOUNTER — Ambulatory Visit: Payer: BLUE CROSS/BLUE SHIELD | Admitting: Thoracic Surgery (Cardiothoracic Vascular Surgery)

## 2015-01-03 ENCOUNTER — Inpatient Hospital Stay (HOSPITAL_COMMUNITY): Payer: BLUE CROSS/BLUE SHIELD

## 2015-01-03 ENCOUNTER — Other Ambulatory Visit: Payer: Self-pay | Admitting: *Deleted

## 2015-01-03 DIAGNOSIS — D72829 Elevated white blood cell count, unspecified: Secondary | ICD-10-CM

## 2015-01-03 DIAGNOSIS — J90 Pleural effusion, not elsewhere classified: Secondary | ICD-10-CM

## 2015-01-03 DIAGNOSIS — J9600 Acute respiratory failure, unspecified whether with hypoxia or hypercapnia: Secondary | ICD-10-CM

## 2015-01-03 LAB — CBC
HCT: 40.7 % (ref 36.0–46.0)
Hemoglobin: 12.6 g/dL (ref 12.0–15.0)
MCH: 30.7 pg (ref 26.0–34.0)
MCHC: 31 g/dL (ref 30.0–36.0)
MCV: 99.3 fL (ref 78.0–100.0)
Platelets: 457 10*3/uL — ABNORMAL HIGH (ref 150–400)
RBC: 4.1 MIL/uL (ref 3.87–5.11)
RDW: 13 % (ref 11.5–15.5)
WBC: 20.2 10*3/uL — AB (ref 4.0–10.5)

## 2015-01-03 LAB — COMPREHENSIVE METABOLIC PANEL
ALBUMIN: 2.7 g/dL — AB (ref 3.5–5.0)
ALT: 16 U/L (ref 14–54)
ANION GAP: 7 (ref 5–15)
AST: 17 U/L (ref 15–41)
Alkaline Phosphatase: 66 U/L (ref 38–126)
BUN: 8 mg/dL (ref 6–20)
CHLORIDE: 78 mmol/L — AB (ref 101–111)
CO2: 47 mmol/L — ABNORMAL HIGH (ref 22–32)
Calcium: 9.2 mg/dL (ref 8.9–10.3)
Glucose, Bld: 141 mg/dL — ABNORMAL HIGH (ref 65–99)
POTASSIUM: 3.9 mmol/L (ref 3.5–5.1)
Sodium: 132 mmol/L — ABNORMAL LOW (ref 135–145)
Total Bilirubin: 0.8 mg/dL (ref 0.3–1.2)
Total Protein: 6.1 g/dL — ABNORMAL LOW (ref 6.5–8.1)

## 2015-01-03 LAB — PROTIME-INR
INR: 1.03 (ref 0.00–1.49)
Prothrombin Time: 13.7 seconds (ref 11.6–15.2)

## 2015-01-03 LAB — MRSA PCR SCREENING: MRSA BY PCR: NEGATIVE

## 2015-01-03 MED ORDER — AMITRIPTYLINE HCL 25 MG PO TABS
50.0000 mg | ORAL_TABLET | Freq: Every day | ORAL | Status: DC
  Administered 2015-01-03 (×2): 50 mg via ORAL
  Filled 2015-01-03 (×2): qty 2

## 2015-01-03 MED ORDER — POLYETHYLENE GLYCOL 3350 17 G PO PACK: 17.0000 g | PACK | Freq: Every day | ORAL | Status: DC | PRN

## 2015-01-03 MED ORDER — FLUTICASONE FUROATE-VILANTEROL 100-25 MCG/INH IN AEPB: 1.0000 | INHALATION_SPRAY | Freq: Every day | RESPIRATORY_TRACT | Status: DC

## 2015-01-03 MED ORDER — DIFLUPREDNATE 0.05 % OP EMUL: 1.0000 [drp] | Freq: Four times a day (QID) | OPHTHALMIC | Status: DC

## 2015-01-03 MED ORDER — ALPRAZOLAM 1 MG PO TABS
1.0000 mg | ORAL_TABLET | Freq: Three times a day (TID) | ORAL | Status: DC | PRN
  Filled 2015-01-03: qty 1

## 2015-01-03 MED ORDER — ONDANSETRON HCL 4 MG PO TABS: 4.0000 mg | ORAL_TABLET | Freq: Four times a day (QID) | ORAL | Status: DC | PRN

## 2015-01-03 MED ORDER — ACETAMINOPHEN 325 MG PO TABS: 650.0000 mg | ORAL_TABLET | Freq: Four times a day (QID) | ORAL | Status: DC | PRN

## 2015-01-03 MED ORDER — DM-GUAIFENESIN ER 30-600 MG PO TB12
1.0000 | ORAL_TABLET | Freq: Two times a day (BID) | ORAL | Status: DC | PRN
  Administered 2015-01-03: 1 via ORAL
  Filled 2015-01-03 (×2): qty 1

## 2015-01-03 MED ORDER — SODIUM CHLORIDE 0.9 % IV SOLN: 250.0000 mL | INTRAVENOUS | Status: DC | PRN

## 2015-01-03 MED ORDER — SUCRALFATE 1 G PO TABS
1.0000 g | ORAL_TABLET | Freq: Three times a day (TID) | ORAL | Status: DC
  Administered 2015-01-03 (×3): 1 g via ORAL
  Filled 2015-01-03 (×3): qty 1

## 2015-01-03 MED ORDER — HYDROCODONE-HOMATROPINE 5-1.5 MG/5ML PO SYRP
5.0000 mL | ORAL_SOLUTION | Freq: Four times a day (QID) | ORAL | Status: DC | PRN
  Filled 2015-01-03: qty 5

## 2015-01-03 MED ORDER — IPRATROPIUM BROMIDE 0.02 % IN SOLN
0.5000 mg | Freq: Four times a day (QID) | RESPIRATORY_TRACT | Status: DC
  Administered 2015-01-03 (×2): 0.5 mg via RESPIRATORY_TRACT
  Filled 2015-01-03 (×2): qty 2.5

## 2015-01-03 MED ORDER — PREDNISONE 10 MG PO TABS
10.0000 mg | ORAL_TABLET | Freq: Every day | ORAL | Status: DC
  Administered 2015-01-03: 10 mg via ORAL
  Filled 2015-01-03: qty 1

## 2015-01-03 MED ORDER — SODIUM CHLORIDE 0.9 % IJ SOLN
3.0000 mL | Freq: Two times a day (BID) | INTRAMUSCULAR | Status: DC
Start: 2015-01-03 — End: 2015-01-03
  Administered 2015-01-03: 3 mL via INTRAVENOUS

## 2015-01-03 MED ORDER — ONDANSETRON HCL 4 MG/2ML IJ SOLN: 4.0000 mg | Freq: Four times a day (QID) | INTRAMUSCULAR | Status: DC | PRN

## 2015-01-03 MED ORDER — ACETAMINOPHEN 650 MG RE SUPP: 650.0000 mg | Freq: Four times a day (QID) | RECTAL | Status: DC | PRN

## 2015-01-03 MED ORDER — LEVALBUTEROL HCL 0.63 MG/3ML IN NEBU
0.6300 mg | INHALATION_SOLUTION | Freq: Four times a day (QID) | RESPIRATORY_TRACT | Status: DC
  Administered 2015-01-03 (×2): 0.63 mg via RESPIRATORY_TRACT
  Filled 2015-01-03 (×2): qty 3

## 2015-01-03 MED ORDER — DIGOXIN 125 MCG PO TABS
0.1250 mg | ORAL_TABLET | Freq: Every day | ORAL | Status: DC
  Administered 2015-01-03: 0.125 mg via ORAL
  Filled 2015-01-03: qty 1

## 2015-01-03 MED ORDER — BENZONATATE 100 MG PO CAPS
200.0000 mg | ORAL_CAPSULE | Freq: Three times a day (TID) | ORAL | Status: DC
  Administered 2015-01-03 (×2): 200 mg via ORAL
  Filled 2015-01-03 (×2): qty 2

## 2015-01-03 MED ORDER — LEVALBUTEROL HCL 0.63 MG/3ML IN NEBU: 0.6300 mg | INHALATION_SOLUTION | Freq: Four times a day (QID) | RESPIRATORY_TRACT | Status: DC | PRN

## 2015-01-03 MED ORDER — SODIUM CHLORIDE 0.9 % IJ SOLN: 3.0000 mL | INTRAMUSCULAR | Status: DC | PRN

## 2015-01-03 MED ORDER — GATIFLOXACIN 0.5 % OP SOLN
1.0000 [drp] | Freq: Two times a day (BID) | OPHTHALMIC | Status: DC
  Administered 2015-01-03: 1 [drp] via OPHTHALMIC
  Filled 2015-01-03: qty 2.5

## 2015-01-03 MED ORDER — TRAZODONE HCL 50 MG PO TABS
50.0000 mg | ORAL_TABLET | Freq: Every day | ORAL | Status: DC
  Administered 2015-01-03: 50 mg via ORAL
  Filled 2015-01-03: qty 1

## 2015-01-03 MED ORDER — METOPROLOL SUCCINATE ER 25 MG PO TB24
25.0000 mg | ORAL_TABLET | Freq: Every day | ORAL | Status: DC
  Administered 2015-01-03 (×2): 25 mg via ORAL
  Filled 2015-01-03 (×3): qty 1

## 2015-01-03 NOTE — Consult Note (Signed)
Reason for Consult:Malignant pleural effusion Referring Physician: Dr. Vanessa Wickett Linda Donovan is an 59 y.o. female.  HPI: 59 yo woman with metastatic lung cancer who presents with a rapidly recurring right pleural effusion. I did a pericardial window on her a year ago for a pericardial effusion. She had her most recent thoracentesis about a week ago. She was becoming more short of breath and was admitted yesterday. She had 1.4 L of serous fluid drained today and feels better postdrainage.  Past Medical History  Diagnosis Date  . COPD (chronic obstructive pulmonary disease)   . ADD (attention deficit disorder)   . Hx of radiation therapy 09/28/12- 11/17/12    RU lung mass, mediastinum chest, 63 gray 35 fx  . Pneumonia   . Arthritis   . Non-small cell lung cancer 09/18/2012    RUL  . Asthma   . Malignant pleural effusion     Past Surgical History  Procedure Laterality Date  . Total abdominal hysterectomy    . Shoulder arthroscopy    . Video bronchoscopy Bilateral 07/25/2012    Procedure: VIDEO BRONCHOSCOPY WITH FLUORO;  Surgeon: Tanda Rockers, MD;  Location: [facility];  Service: Endoscopy;  Laterality: Bilateral;  . Lump removed from breasr right  2003  . Endobronchial ultrasound Bilateral 09/04/2012    Procedure: ENDOBRONCHIAL ULTRASOUND;  Surgeon: Collene Gobble, MD;  Location: [facility];  Service: Cardiopulmonary;  Laterality: Bilateral;  . Breast surgery    . Subxyphoid pericardial window N/A 01/04/2014    Procedure: SUBXYPHOID PERICARDIAL WINDOW;  Surgeon: [name], MD;  Location: [facility];  Service: Thoracic;  Laterality: N/A;  . Intraoperative transesophageal echocardiogram N/A 01/04/2014    Procedure: INTRAOPERATIVE TRANSESOPHAGEAL ECHOCARDIOGRAM;  Surgeon: [name], MD;  Location: [facility];  Service: Open Heart Surgery;  Laterality: N/A;    Family History  Problem Relation Age of Onset  . COPD Paternal Uncle   . Heart disease Mother   . Heart disease  Maternal Grandmother   . Cancer Paternal Grandmother     breast  . Prostate cancer Maternal Grandfather   . Cancer Maternal Grandfather     prostate    Social History:  reports that she quit smoking about 18 years ago. Her smoking use included Cigarettes. She has a 20 pack-year smoking history. She has never used smokeless tobacco. She reports that she drinks about 0.6 oz of alcohol per week. She reports that she does not use illicit drugs.  Allergies:  Allergies  Allergen Reactions  . Clinoril [Sulindac] Hives  . Morphine Itching    Medications:  Prior to Admission:  No prescriptions prior to admission    Results for orders placed or performed during the hospital encounter of 01/02/15 (from the past 48 hour(s))  Basic metabolic panel     Status: Abnormal   Collection Time: 01/02/15  5:00 PM  Result Value Ref Range   Sodium 133 (L) 135 - 145 mmol/L   Potassium 4.5 3.5 - 5.1 mmol/L   Chloride 79 (L) 101 - 111 mmol/L   CO2 45 (H) 22 - 32 mmol/L   Glucose, Bld 148 (H) 65 - 99 mg/dL   BUN 9 6 - 20 mg/dL   Creatinine, Ser 0.46 0.44 - 1.00 mg/dL   Calcium 9.6 8.9 - 10.3 mg/dL   GFR calc non Af Amer >60 >60 mL/min   GFR calc Af Amer >60 >60 mL/min    Comment: (NOTE) The eGFR has been calculated using the CKD  EPI equation. This calculation has not been validated in all clinical situations. eGFR's persistently <60 mL/min signify possible Chronic Kidney Disease.    Anion gap 9 5 - 15  Brain natriuretic peptide     Status: Abnormal   Collection Time: 01/02/15  5:00 PM  Result Value Ref Range   B Natriuretic Peptide 100.5 (H) 0.0 - 100.0 pg/mL  CBC with Differential     Status: Abnormal   Collection Time: 01/02/15  5:00 PM  Result Value Ref Range   WBC 22.0 (H) 4.0 - 10.5 K/uL   RBC 4.63 3.87 - 5.11 MIL/uL   Hemoglobin 14.3 12.0 - 15.0 g/dL   HCT 46.8 (H) 36.0 - 46.0 %   MCV 101.1 (H) 78.0 - 100.0 fL   MCH 30.9 26.0 - 34.0 pg   MCHC 30.6 30.0 - 36.0 g/dL   RDW 12.9 11.5 -  15.5 %   Platelets 544 (H) 150 - 400 K/uL   Neutrophils Relative % 92 (H) 43 - 77 %   Neutro Abs 20.2 (H) 1.7 - 7.7 K/uL   Lymphocytes Relative 2 (L) 12 - 46 %   Lymphs Abs 0.4 (L) 0.7 - 4.0 K/uL   Monocytes Relative 6 3 - 12 %   Monocytes Absolute 1.3 (H) 0.1 - 1.0 K/uL   Eosinophils Relative 0 0 - 5 %   Eosinophils Absolute 0.0 0.0 - 0.7 K/uL   Basophils Relative 0 0 - 1 %   Basophils Absolute 0.0 0.0 - 0.1 K/uL  I-stat troponin, ED     Status: None   Collection Time: 01/02/15  5:36 PM  Result Value Ref Range   Troponin i, poc 0.01 0.00 - 0.08 ng/mL   Comment 3            Comment: Due to the release kinetics of cTnI, a negative result within the first hours of the onset of symptoms does not rule out myocardial infarction with certainty. If myocardial infarction is still suspected, repeat the test at appropriate intervals.   Blood gas, arterial     Status: Abnormal   Collection Time: 01/02/15  7:27 PM  Result Value Ref Range   O2 Content 4.0 L/min   Delivery systems NASAL CANNULA    pH, Arterial 7.335 (L) 7.350 - 7.450   pCO2 arterial 95.1 (HH) 35.0 - 45.0 mmHg    Comment: CRITICAL RESULT CALLED TO, READ BACK BY AND VERIFIED WITH: Roxine Caddy, MD AT [place], RRT, RCP ON 01/02/15    pO2, Arterial 70.9 (L) 80.0 - 100.0 mmHg   Bicarbonate 49.3 (H) 20.0 - 24.0 mEq/L   TCO2 45.0 0 - 100 mmol/L   Acid-Base Excess 18.9 (H) 0.0 - 2.0 mmol/L   O2 Saturation 92.5 %   Patient temperature 98.6    Collection site LEFT RADIAL    Drawn by [id number]    Sample type ARTERIAL DRAW    Allens test (pass/fail) PASS PASS  I-Stat CG4 Lactic Acid, ED     Status: None   Collection Time: 01/02/15  7:30 PM  Result Value Ref Range   Lactic Acid, Venous 0.75 0.5 - 2.0 mmol/L  Urinalysis, Routine w reflex microscopic (not at [facility])     Status: Abnormal   Collection Time: 01/02/15  9:53 PM  Result Value Ref Range   Color, Urine AMBER (A) YELLOW    Comment: BIOCHEMICALS MAY BE AFFECTED BY COLOR    APPearance CLOUDY (A) CLEAR   Specific Gravity, Urine 1.033 (  H) 1.005 - 1.030   pH 5.5 5.0 - 8.0   Glucose, UA NEGATIVE NEGATIVE mg/dL   Hgb urine dipstick NEGATIVE NEGATIVE   Bilirubin Urine NEGATIVE NEGATIVE   Ketones, ur NEGATIVE NEGATIVE mg/dL   Protein, ur NEGATIVE NEGATIVE mg/dL   Urobilinogen, UA 0.2 0.0 - 1.0 mg/dL   Nitrite NEGATIVE NEGATIVE   Leukocytes, UA TRACE (A) NEGATIVE  Urine microscopic-add on     Status: Abnormal   Collection Time: 01/02/15  9:53 PM  Result Value Ref Range   Squamous Epithelial / LPF MANY (A) RARE   WBC, UA 0-2 <3 WBC/hpf   Bacteria, UA FEW (A) RARE   Casts HYALINE CASTS (A) NEGATIVE   Crystals CA OXALATE CRYSTALS (A) NEGATIVE   Urine-Other MUCOUS PRESENT   Culture, blood (routine x 2)     Status: None (Preliminary result)   Collection Time: 01/02/15 10:00 PM  Result Value Ref Range   Specimen Description BLOOD LEFT FOREARM    Special Requests BOTTLES DRAWN AEROBIC ONLY 5CC    Culture      NO GROWTH < 24 HOURS Performed at [facility]    Report Status PENDING   Culture, blood (routine x 2)     Status: None (Preliminary result)   Collection Time: 01/02/15 10:03 PM  Result Value Ref Range   Specimen Description BLOOD RIGHT HAND    Special Requests BOTTLES DRAWN AEROBIC ONLY 3CC    Culture      NO GROWTH < 24 HOURS Performed at [facility]    Report Status PENDING   MRSA PCR Screening     Status: None   Collection Time: 01/03/15 12:42 AM  Result Value Ref Range   MRSA by PCR NEGATIVE NEGATIVE    Comment:        The GeneXpert MRSA Assay (FDA approved for NASAL specimens only), is one component of a comprehensive MRSA colonization surveillance program. It is not intended to diagnose MRSA infection nor to guide or monitor treatment for MRSA infections.   Comprehensive metabolic panel     Status: Abnormal   Collection Time: 01/03/15  4:27 AM  Result Value Ref Range   Sodium 132 (L) 135 - 145 mmol/L   Potassium  3.9 3.5 - 5.1 mmol/L   Chloride 78 (L) 101 - 111 mmol/L   CO2 47 (H) 22 - 32 mmol/L   Glucose, Bld 141 (H) 65 - 99 mg/dL   BUN 8 6 - 20 mg/dL   Creatinine, Ser <8.85 (L) 0.44 - 1.00 mg/dL    Comment: CORRECTED ON 07/29 AT 0631: PREVIOUSLY REPORTED AS 0.30   Calcium 9.2 8.9 - 10.3 mg/dL   Total Protein 6.1 (L) 6.5 - 8.1 g/dL   Albumin 2.7 (L) 3.5 - 5.0 g/dL   AST 17 15 - 41 U/L   ALT 16 14 - 54 U/L   Alkaline Phosphatase 66 38 - 126 U/L   Total Bilirubin 0.8 0.3 - 1.2 mg/dL   GFR calc non Af Amer NOT CALCULATED >60 mL/min    Comment: CORRECTED ON 07/29 AT 0631: PREVIOUSLY REPORTED AS >60   GFR calc Af Amer NOT CALCULATED >60 mL/min    Comment: (NOTE) The eGFR has been calculated using the CKD EPI equation. This calculation has not been validated in all clinical situations. eGFR's persistently <60 mL/min signify possible Chronic Kidney Disease. CORRECTED ON 07/29 AT 0631: PREVIOUSLY REPORTED AS >60    Anion gap 7 5 - 15  CBC  Status: Abnormal   Collection Time: 01/03/15  4:27 AM  Result Value Ref Range   WBC 20.2 (H) 4.0 - 10.5 K/uL   RBC 4.10 3.87 - 5.11 MIL/uL   Hemoglobin 12.6 12.0 - 15.0 g/dL   HCT 40.7 36.0 - 46.0 %   MCV 99.3 78.0 - 100.0 fL   MCH 30.7 26.0 - 34.0 pg   MCHC 31.0 30.0 - 36.0 g/dL   RDW 13.0 11.5 - 15.5 %   Platelets 457 (H) 150 - 400 K/uL  Protime-INR     Status: None   Collection Time: 01/03/15  4:27 AM  Result Value Ref Range   Prothrombin Time 13.7 11.6 - 15.2 seconds   INR 1.03 0.00 - 1.49    Dg Chest 2 View  01/02/2015   CLINICAL DATA:  History of lung carcinoma, short of breath over the last several days, history of recurring pleural effusions  EXAM: CHEST  2 VIEW  COMPARISON:  Chest x-ray of 12/25/2014 and CT chest of 11/26/2014  FINDINGS: There is now more opacity at the right lung base most consistent with recurrent right pleural effusion and postoperative change with volume loss. Poor aeration of the right upper lung field again is noted.  The left lung is clear and somewhat hyperaerated. Heart size is stable. No bony abnormality is seen.  IMPRESSION: More opacity at the right lung base most consistent with increasing right pleural effusion.   Electronically Signed   By: Ivar Drape M.D.   On: 01/02/2015 14:25   [place] Chest  01/02/2015   CLINICAL DATA:  Right pleural effusion.  EXAM: CHEST ULTRASOUND  COMPARISON:  Chest x-ray dated 01/02/2015  FINDINGS: There is a large pocket of pleural fluid seen posteriorly in the right hemi thorax. However, because of hypotension, thoracentesis was not performed.  IMPRESSION: There is a large pocket of pleural fluid in the right hemi thorax. Thoracentesis not performed because of hypotension.   Electronically Signed   By: Lorriane Shire M.D.   On: 01/02/2015 17:04   Dg Chest Port 1 View  01/03/2015   CLINICAL DATA:  Pleural effusion, cough, lung cancer, emphysema  EXAM: PORTABLE CHEST - 1 VIEW  COMPARISON:  12/25/2014, CT chest 11/26/2014, chest x-ray 12/18/2014  FINDINGS: Persistent right upper lobe airspace opacity consistent with known malignancy. There is patchy right upper lobe airspace disease most concerning for postobstructive pneumonitis. There is a small right pleural effusion. There is no left pleural effusion, pneumothorax or focal consolidation. There is right lung volume loss. Stable cardiomediastinal silhouette. No acute osseous abnormality.  IMPRESSION: 1. Persistent right upper lobe airspace opacity consistent with known malignancy. 2. Patchy right upper lobe airspace disease concerning for postobstructive pneumonitis. 3. Small right pleural effusion.   Electronically Signed   By: Kathreen Devoid   On: 01/03/2015 11:15    Review of Systems  Respiratory: Positive for shortness of breath.   Cardiovascular: Positive for orthopnea.   Blood pressure 131/52, pulse 85, temperature 98.6 F (37 C), temperature source Oral, resp. rate 18, height $RemoveBe'5\' 3"'[id number]$  (1.6 m), weight 130 lb 1.1 oz (59 kg), SpO2 95  %. Physical Exam  Vitals reviewed. Constitutional: She is oriented to person, place, and time. She appears distressed (mildly).  HENT:  Head: Normocephalic and atraumatic.  Eyes: Conjunctivae and EOM are normal. No scleral icterus.  Neck: Neck supple. No tracheal deviation present.  Cardiovascular: Normal rate, regular rhythm and normal heart sounds.   Respiratory: Effort normal.  Absent BS right apex  GI: Soft. There is no tenderness.  Musculoskeletal: She exhibits no edema.  Lymphadenopathy:    She has no cervical adenopathy.  Neurological: She is alert and oriented to person, place, and time. No cranial nerve deficit.  Skin: Skin is warm and dry.    Assessment/Plan: Linda Donovan is a 59 yo woman with a malignant right pleural effusion that is recurring rapidly and has required thoracentesis weekly for the past 3 weeks, 4 thoracenteses altogether.  She presented with progressive shortness of breath and her chest xray showed a large right effusion. She had a thoracentesis this morning with 1.4 L drained. She feels better post drainage. The effusion is highly likely to recur.  I discussed placement of a pleural catheter for management of the effusion with the patient and her family. I described the general nature of the procedure and management of the catheter postop. I informed them of the indications, risks, benefits and alternatives. They understand the risks include bleeding, infection, catheter malposition and catheter occlusion. She understands and accepts the risks and agrees to proceed.   Plan OR next Wednesday.  She will be discharged later today   [name] 01/03/2015, 5:09 PM

## 2015-01-03 NOTE — Discharge Summary (Addendum)
Physician Discharge Summary  CARETHA RUMBAUGH HYW:[id number] DOB: 1956-05-03 DOA: 01/02/2015  PCP: Pcp Not In System PCP in [place] date: 01/02/2015 Discharge date: 01/03/2015  Time spent: 45 minutes  Recommendations for Outpatient Follow-up:  1. Has cardiology app for 7/10-please check BP and adjust antihypertensives and diuretics as needed   Discharge Condition: stable Diet recommendation: low sodium, heart healthy  Discharge Diagnoses:  Principal Problem:   Respiratory failure with hypoxia and hypercapnia--  Pleural effusion Active Problems:   COPD  GOLD III   Non-small cell lung cancer   Leukocytosis   History of present illness:  Aaralynn SCHERYL SANBORN is a 59 y.o. female with PMH significant for COPD gold III, Non small Lung cell cancer, follows with Dr Julien Nordmann. She has had 3 previous thoracentesis for malignant right-sided pleural effusion. She was in the IR department for another thoracentesis however, as his systolic blood pressure was in the 80s and 90s, the procedure was not done. She was given a 250 mL bolus and referred for admission to the hospital. Dr. Julien Nordmann also made her an appointment with CT surgery to have a Pleurx catheter placed. This appointment is for next week.  She was admitted last night for hypotension and shortness of breath in relation to increasing effusion.. She is on Lasix and Aldactone as outpatient. She took her Lasix yesterday morning but did not take her Aldactone.  Hospital Course:  Malignant Right-sided pleural effusion/hypotension -She was monitored in the stepdown unit overnight. She was not given any additional fluids. Blood pressure slowly rose to a systolic of 485I to 627O. -Bedside thoracentesis was performed by pulmonary critical care this morning-1400 mL of straw fluid was obtained -Repeat blood pressure after procedure 113/55 -Patient was short of breath but this has resolved after fluid removed -Chest x-ray does not reveal any  pneumothorax-right-sided effusion is minimal -She is wanting to be discharged home and I agree with discharge-she has a follow-up appointment with CT surgery next week to obtain a Pleurx cath-this was discussed with Dr. Julien Nordmann and also Dr. Roxan Hockey today and all are in agreement with the current plan -In regards to hypotension, I have recommended that she check her BP at home on a daily basis-they have an electronic blood pressure monitor-if systolic blood pressure persistently below 100, they're to call her PCP to decide what to do about her antihypertensives that she is taking-also has a cardiology appointment next week where BP can be checked and medications and be adjusted  Leukocytosis -She has chronic leukocytosis -Repeat chest x-ray after fluid revealed possible post obstructive pneumonitis in the right upper lobe in the area -According to the patient , her symptoms of cough and no worse than usual and therefore I will not prescribe antibiotics for her-her symptoms be monitored as outpatient and if worsening, further management will be seen  Systolic heart failure? -This is listed in her chart however last echo from 11/26/2014 reveals EF was 65-70% and therefore, if she will be getting a Pleurx, she should not be on diuretics any longer- l-will allow her PCP and oncologist to decide on this  Procedures:  Thoracentesis  Consultations:  PCCM  Discharge Exam: Filed Weights   01/03/15 0047  Weight: 59 kg (130 lb 1.1 oz)   Filed Vitals:   01/03/15 1240  BP:   Pulse: 94  Temp:   Resp: 20    General: AAO x 3, no distress Cardiovascular: RRR, no murmurs  Respiratory: clear to auscultation bilaterally GI: soft, non-tender,  non-distended, bowel sound positive  Discharge Instructions You were cared for by a hospitalist during your hospital stay. If you have any questions about your discharge medications or the care you received while you were in the hospital after you are  discharged, you can call the unit and asked to speak with the hospitalist on call if the hospitalist that took care of you is not available. Once you are discharged, your primary care physician will handle any further medical issues. Please note that NO REFILLS for any discharge medications will be authorized once you are discharged, as it is imperative that you return to your primary care physician (or establish a relationship with a primary care physician if you do not have one) for your aftercare needs so that they can reassess your need for medications and monitor your lab values.      Discharge Instructions    Diet - low sodium heart healthy    Complete by:  As directed      Discharge instructions    Complete by:  As directed   Check BP daily at the same time while sitting up. If top number stays below 100, call your family doctor and ask if BP medications should be held.     Increase activity slowly    Complete by:  As directed             Medication List    TAKE these medications        ALPRAZolam 1 MG tablet  Commonly known as:  XANAX  Take 1 tablet (1 mg total) by mouth 3 (three) times daily.     amitriptyline 50 MG tablet  Commonly known as:  ELAVIL  Take 1 tablet (50 mg total) by mouth daily.     aspirin 81 MG tablet  Take 81 mg by mouth at bedtime.     benzonatate 200 MG capsule  Commonly known as:  TESSALON  Take 200 mg by mouth 3 (three) times daily.     BESIVANCE 0.6 % Susp  Generic drug:  Besifloxacin HCl  Place 1 drop into the left eye 2 (two) times daily.     dextromethorphan-guaiFENesin 30-600 MG per 12 hr tablet  Commonly known as:  MUCINEX DM  Take 1 tablet by mouth 2 (two) times daily as needed for cough.     digoxin 0.125 MG tablet  Commonly known as:  LANOXIN  Take 1 tablet (0.125 mg total) by mouth daily.     docusate sodium 100 MG capsule  Commonly known as:  COLACE  Take 1 capsule (100 mg total) by mouth 2 (two) times daily.     DUREZOL  0.05 % Emul  Generic drug:  Difluprednate  Place 1 drop into the left eye 4 (four) times daily.     Fluticasone Furoate-Vilanterol 100-25 MCG/INH Aepb  Commonly known as:  BREO ELLIPTA  Inhale 1 puff into the lungs daily.     folic acid 1 MG tablet  Commonly known as:  FOLVITE  Take 1 tablet (1 mg total) by mouth daily.     furosemide 40 MG tablet  Commonly known as:  LASIX  Take 1 tablet (40 mg total) by mouth daily. May give extra dose 2-3 days  a week for edema.     HYDROcodone-homatropine 5-1.5 MG/5ML syrup  Commonly known as:  HYCODAN  Take 5 mLs by mouth every 6 (six) hours as needed for cough.     ipratropium 0.02 % nebulizer solution  Commonly  known as:  ATROVENT  Take 2.5 mLs (0.5 mg total) by nebulization 4 (four) times daily.     levalbuterol 0.63 MG/3ML nebulizer solution  Commonly known as:  XOPENEX  Take 3 mLs (0.63 mg total) by nebulization 4 (four) times daily.     levalbuterol 45 MCG/ACT inhaler  Commonly known as:  XOPENEX HFA  Inhale 2 puffs into the lungs every 4 (four) hours as needed for wheezing.     losartan 25 MG tablet  Commonly known as:  COZAAR  Take 0.5 tablets (12.5 mg total) by mouth daily.     metoprolol succinate 25 MG 24 hr tablet  Commonly known as:  TOPROL-XL  Take 25 mg by mouth daily.     oxyCODONE-acetaminophen 5-325 MG per tablet  Commonly known as:  PERCOCET/ROXICET  Take 1 tablet by mouth every 4 (four) hours as needed for moderate pain or severe pain.     OXYGEN  Inhale 4 L into the lungs continuous.     polyethylene glycol packet  Commonly known as:  MIRALAX / GLYCOLAX  Take 17 g by mouth daily as needed for mild constipation or moderate constipation.     potassium chloride SA 20 MEQ tablet  Commonly known as:  K-DUR,KLOR-CON  TAKE 1 TABLET BY MOUTH EVERY DAY     predniSONE 10 MG tablet  Commonly known as:  DELTASONE  Take 10 mg by mouth daily.     PROLENSA 0.07 % Soln  Generic drug:  Bromfenac Sodium  Place 1  drop into the left eye daily.     SENOKOT S 8.6-50 MG per tablet  Generic drug:  senna-docusate  Take 1 tablet by mouth at bedtime.     spironolactone 25 MG tablet  Commonly known as:  ALDACTONE  Take 0.5 tablets (12.5 mg total) by mouth daily.     sucralfate 1 G tablet  Commonly known as:  CARAFATE  Take 1 tablet (1 g total) by mouth 4 (four) times daily -  with meals and at bedtime. Dissolve in 10 ml of water prior to taking     traZODone 50 MG tablet  Commonly known as:  DESYREL  Take 50 mg by mouth at bedtime.       Allergies  Allergen Reactions  . Clinoril [Sulindac] Hives  . Morphine Itching      The results of significant diagnostics from this hospitalization (including imaging, microbiology, ancillary and laboratory) are listed below for reference.    Significant Diagnostic Studies: Dg Chest 1 View  12/25/2014   CLINICAL DATA:  Post thoracentesis.  EXAM: CHEST  1 VIEW  COMPARISON:  Chest x-ray from earlier same day.  FINDINGS: Interval resolution of the right pleural effusion, at the right lung base, status post thoracentesis. Opacity at the right lung apex is unchanged and, by a previous chest x-ray report, consistent with a known malignancy. Left lung appears stable with hyper expansion and probable basilar scarring/fibrosis.  Cardiomediastinal silhouette is stable in size and configuration. No acute osseous abnormality identified. No pneumothorax seen.  IMPRESSION: Interval resolution of the pleural effusion at the right lung base status post thoracentesis. No procedural complicating feature identified.   Electronically Signed   By: Franki Cabot M.D.   On: 12/25/2014 15:12   Dg Chest 1 View  12/18/2014   CLINICAL DATA:  History of lung cancer, now with recurrent symptomatic right-sided pleural effusion. Post right-sided thoracentesis.  EXAM: CHEST  1 VIEW  COMPARISON:  12/18/2014; 12/07/2010; 12/06/2014; ultrasound-guided right-sided  thoracentesis - 12/07/2014   FINDINGS: Grossly unchanged cardiac silhouette and mediastinal contours. Significant reduction / near resolution of right-sided pleural effusion post thoracentesis. No pneumothorax. Marked improved aeration of the right lung with grossly unchanged consolidative mass within the medial aspect the right lung apex compatible with known malignancy. No new focal airspace opacities. The lungs remain hyperexpanded. No evidence of edema. Unchanged bones.  IMPRESSION: 1. Significant reduction/near resolution of right-sided pleural effusion post thoracentesis. No pneumothorax. 2. Marked improved aeration of the right lung with grossly unchanged consolidative mass within medial aspect of the right lung apex compatible with known malignancy.   Electronically Signed   By: Sandi Mariscal M.D.   On: 12/18/2014 16:53   Dg Chest 1 View  12/07/2014   CLINICAL DATA:  Post RIGHT thoracentesis, history RIGHT upper lobe non-small-cell lung cancer post radiation therapy, COPD, former smoker, asthma  EXAM: CHEST  1 VIEW  COMPARISON:  12/06/2014  FINDINGS: Significant decrease in RIGHT pleural effusion post thoracentesis.  No pneumothorax.  Volume loss, scarring and opacity of the RIGHT upper lobe again identified compatible with history of neoplasm and radiation.  Underlying emphysematous changes.  Improved atelectasis LEFT mid lung.  Significant bullous disease LEFT upper lobe.  No new infiltrate identified.  Cysts  IMPRESSION: Significant decrease in RIGHT pleural effusion post thoracentesis with improved aeration of RIGHT lung.  Chronic volume loss in opacification of RIGHT upper lobe compatible with history of neoplasm and radiation.  Underlying bullous COPD with improved atelectasis in LEFT mid lung.   Electronically Signed   By: Lavonia Dana M.D.   On: 12/07/2014 11:08   Dg Chest 2 View  01/02/2015   CLINICAL DATA:  History of lung carcinoma, short of breath over the last several days, history of recurring pleural effusions  EXAM:  CHEST  2 VIEW  COMPARISON:  Chest x-ray of 12/25/2014 and CT chest of 11/26/2014  FINDINGS: There is now more opacity at the right lung base most consistent with recurrent right pleural effusion and postoperative change with volume loss. Poor aeration of the right upper lung field again is noted. The left lung is clear and somewhat hyperaerated. Heart size is stable. No bony abnormality is seen.  IMPRESSION: More opacity at the right lung base most consistent with increasing right pleural effusion.   Electronically Signed   By: Ivar Drape M.D.   On: 01/02/2015 14:25   Dg Chest 2 View  12/25/2014   CLINICAL DATA:  59 year old female with shortness of breath. Non-small cell lung cancer. Subsequent encounter.  EXAM: CHEST  2 VIEW  COMPARISON:  12/18/2014 and earlier  FINDINGS: Interval reaccumulation of the loculated right pleural effusion, but less severe than on 12/18/2014. Mild residual right lung ventilation. Stable visualized mediastinal contours. The left lung appears stable. No pneumothorax. Stable visualized osseous structures.  IMPRESSION: Re-accumulation of the loculated right pleural effusion, but less severe than that seen prior to the 12/18/2014 thoracentesis.  Otherwise stable chest.   Electronically Signed   By: Genevie Ann M.D.   On: 12/25/2014 10:42   Dg Chest 2 View  12/18/2014   CLINICAL DATA:  59 year old female with increasing shortness of breath status post thoracentesis  EXAM: CHEST  2 VIEW  COMPARISON:  Prior chest x-ray 12/07/2014 and 12/06/2014; prior chest CT 11/26/2014  FINDINGS: Interval reaccumulation of pleural fluid which is now loculated in appearance posteriorly, laterally and inferiorly. There is near whiteout of the right chest. The left lung remains hyperinflated with a background bronchitic  changes, emphysema and areas of pleural parenchymal scarring. Persistent volume loss in the right hemi thorax with left-to-right shift of the cardiac and mediastinal structures. No acute  osseous abnormality.  IMPRESSION: 1. Interval reaccumulation of large right-sided pleural effusion which is now loculated in appearance. 2. Unchanged severe emphysema and hyperinflation involving the left lung.   Electronically Signed   By: Jacqulynn Cadet M.D.   On: 12/18/2014 13:03   Dg Chest 2 View  12/06/2014   CLINICAL DATA:  Fatigue.  Right upper lobe lung cancer.  EXAM: CHEST  2 VIEW  COMPARISON:  11/26/2014  FINDINGS: Further reduction in aeration in the right lung, with a small amount of aerated upper lobe and a small amount of aerated right middle lobe. Underlying pleural effusion suspected with extensive atelectasis. There is some shift of cardiac and mediastinal structures to the right as well as rightward tracheal shaft.  Severe emphysema. Left mid lung linear and nodular opacity, indistinct, new.  Thoracic spondylosis.  IMPRESSION: 1. Considerably reduced aeration in the right lung with only a small amount of right upper lobe and right middle lobe still aerated. This represents a significant worsening from 11/26/2014 with regard to the amount of aeration. There shift of the trachea and mediastinal structures to the right indicating atelectasis. 2. Markedly severe emphysema. 3. Indistinct linear nodular opacity in the left mid lung is nonspecific and could be inflammatory or infectious. This is less likely to be malignant given that it was not present 10 days ago.   Electronically Signed   By: Van Clines M.D.   On: 12/06/2014 16:47   [place] Chest  01/02/2015   CLINICAL DATA:  Right pleural effusion.  EXAM: CHEST ULTRASOUND  COMPARISON:  Chest x-ray dated 01/02/2015  FINDINGS: There is a large pocket of pleural fluid seen posteriorly in the right hemi thorax. However, because of hypotension, thoracentesis was not performed.  IMPRESSION: There is a large pocket of pleural fluid in the right hemi thorax. Thoracentesis not performed because of hypotension.   Electronically Signed   By: Lorriane Shire M.D.   On: 01/02/2015 17:04   Dg Chest Port 1 View  01/03/2015   CLINICAL DATA:  Pleural effusion, cough, lung cancer, emphysema  EXAM: PORTABLE CHEST - 1 VIEW  COMPARISON:  12/25/2014, CT chest 11/26/2014, chest x-ray 12/18/2014  FINDINGS: Persistent right upper lobe airspace opacity consistent with known malignancy. There is patchy right upper lobe airspace disease most concerning for postobstructive pneumonitis. There is a small right pleural effusion. There is no left pleural effusion, pneumothorax or focal consolidation. There is right lung volume loss. Stable cardiomediastinal silhouette. No acute osseous abnormality.  IMPRESSION: 1. Persistent right upper lobe airspace opacity consistent with known malignancy. 2. Patchy right upper lobe airspace disease concerning for postobstructive pneumonitis. 3. Small right pleural effusion.   Electronically Signed   By: Kathreen Devoid   On: 01/03/2015 11:15   US Thoracentesis Asp Pleural Space W/img Guide  12/25/2014   CLINICAL DATA:  Recurrent pleural effusion due to lung cancer  EXAM: ULTRASOUND GUIDED RIGHT THORACENTESIS  COMPARISON:  None.  PROCEDURE: An ultrasound guided thoracentesis was thoroughly discussed with the patient and questions answered. The benefits, risks, alternatives and complications were also discussed. The patient understands and wishes to proceed with the procedure. Written consent was obtained.  Ultrasound was performed to localize and mark an adequate pocket of fluid in the right chest. The area was then prepped and draped in the normal sterile fashion. 1%  Lidocaine was used for local anesthesia. Under ultrasound guidance a Safe T Centesis catheter was introduced. Thoracentesis was performed. The catheter was removed and a dressing applied.  COMPLICATIONS: None immediate.  FINDINGS: A total of approximately 900 mls of dark yellow fluid was removed. A fluid sample was notsent for laboratory analysis.  IMPRESSION: Successful  ultrasound guided right thoracentesis yielding 900 mls of pleural fluid.  Read by:  Gareth Eagle, PA-C   Electronically Signed   By: Sandi Mariscal M.D.   On: 12/25/2014 14:55   US Thoracentesis Asp Pleural Space W/img Guide  12/18/2014   INDICATION: Right lung cancer, dyspnea, recurrent right pleural effusion. Request is made for diagnostic and therapeutic right thoracentesis.  EXAM: ULTRASOUND GUIDED DIAGNOSTIC AND THERAPEUTIC RIGHT THORACENTESIS  COMPARISON:  Prior thoracentesis on 12/07/2014  MEDICATIONS: None  COMPLICATIONS: None immediate  TECHNIQUE: Informed written consent was obtained from the patient after a discussion of the risks, benefits and alternatives to treatment. A timeout was performed prior to the initiation of the procedure.  Initial ultrasound scanning demonstrates a moderate to large right pleural effusion. The lower chest was prepped and draped in the usual sterile fashion. 1% lidocaine was used for local anesthesia.  An ultrasound image was saved for documentation purposes. A 6 Fr Safe-T-Centesis catheter was introduced. The thoracentesis was performed. The catheter was removed and a dressing was applied. The patient tolerated the procedure well without immediate post procedural complication. The patient was escorted to have an upright chest radiograph.  FINDINGS: A total of approximately 1.5 liters of hazy, amber fluid was removed. Requested samples were sent to the laboratory.  IMPRESSION: Successful ultrasound-guided diagnostic and therapeutic right sided thoracentesis yielding 1.5 liters of pleural fluid.  Read by: Rowe Robert, PA-C   Electronically Signed   By: Sandi Mariscal M.D.   On: 12/18/2014 16:29   US Thoracentesis Asp Pleural Space W/img Guide  12/07/2014   INDICATION: Right lung cancer, dyspnea, right pleural effusion. Request is made for diagnostic and therapeutic right thoracentesis.  EXAM: ULTRASOUND GUIDED DIAGNOSTIC AND THERAPEUTIC RIGHT THORACENTESIS  COMPARISON:  None.   MEDICATIONS: None  COMPLICATIONS: None immediate  TECHNIQUE: Informed written consent was obtained from the patient after a discussion of the risks, benefits and alternatives to treatment. A timeout was performed prior to the initiation of the procedure.  Initial ultrasound scanning demonstrates a moderate to large right pleural effusion. The lower chest was prepped and draped in the usual sterile fashion. 1% lidocaine was used for local anesthesia.  An ultrasound image was saved for documentation purposes. A 6 Fr Safe-T-Centesis catheter was introduced. The thoracentesis was performed. The catheter was removed and a dressing was applied. The patient tolerated the procedure well without immediate post procedural complication. The patient was escorted to have an upright chest radiograph.  FINDINGS: A total of approximately 1.6 liters of turbid, amber fluid was removed. Requested samples were sent to the laboratory.  IMPRESSION: Successful ultrasound-guided diagnostic and therapeutic right sided thoracentesis yielding 1.6 liters of pleural fluid.  Read by: Rowe Robert, PA-C   Electronically Signed   By: Lucrezia Europe M.D.   On: 12/07/2014 11:33    Microbiology: Recent Results (from the past 240 hour(s))  MRSA PCR Screening     Status: None   Collection Time: 01/03/15 12:42 AM  Result Value Ref Range Status   MRSA by PCR NEGATIVE NEGATIVE Final    Comment:        The GeneXpert MRSA Assay (FDA approved for NASAL  specimens only), is one component of a comprehensive MRSA colonization surveillance program. It is not intended to diagnose MRSA infection nor to guide or monitor treatment for MRSA infections.      Labs: Basic Metabolic Panel:  Recent Labs Lab 01/02/15 1700 01/03/15 0427  NA 133* 132*  K 4.5 3.9  CL 79* 78*  CO2 45* 47*  GLUCOSE 148* 141*  BUN 9 8  CREATININE 0.46 <0.30*  CALCIUM 9.6 9.2   Liver Function Tests:  Recent Labs Lab 01/03/15 0427  AST 17  ALT 16  ALKPHOS 66   BILITOT 0.8  PROT 6.1*  ALBUMIN 2.7*   No results for input(s): LIPASE, AMYLASE in the last 168 hours. No results for input(s): AMMONIA in the last 168 hours. CBC:  Recent Labs Lab 01/02/15 1700 01/03/15 0427  WBC 22.0* 20.2*  NEUTROABS 20.2*  --   HGB 14.3 12.6  HCT 46.8* 40.7  MCV 101.1* 99.3  PLT 544* 457*   Cardiac Enzymes: No results for input(s): CKTOTAL, CKMB, CKMBINDEX, TROPONINI in the last 168 hours. BNP: BNP (last 3 results)  Recent Labs  09/16/14 0520 12/06/14 1404 01/02/15 1700  BNP 824.7* 113.9* 100.5*    ProBNP (last 3 results)  Recent Labs  01/03/14 1338  PROBNP 149.8*    CBG: No results for input(s): GLUCAP in the last 168 hours.     SignedDebbe Odea, MD [facility] 01/03/2015, 12:56 PM

## 2015-01-03 NOTE — Care Management Note (Signed)
Case Management Note  Patient Details  Name: LAKISA LOTZ MRN: [id number] Date of Birth: Jun 02, 1956  Subjective/Objective:         Hypotension, sepsis and resp failure           Action/Plan: Date:  January 03, 2015 U.R. performed for needs and level of care. Will continue to follow for Case Management needs.  Velva Harman, RN, BSN, [place]   [phone]  Expected Discharge Date:  01/05/15               Expected Discharge Plan:  Home/Self Care  In-House Referral:  NA  Discharge planning Services  CM Consult  Post Acute Care Choice:  NA Choice offered to:  NA  DME Arranged:  N/A DME Agency:  NA  HH Arranged:  NA HH Agency:  NA  Status of Service:     Medicare Important Message Given:    Date Medicare IM Given:    Medicare IM give by:    Date Additional Medicare IM Given:    Additional Medicare Important Message give by:     If discussed at [place] of Stay Meetings, dates discussed:    Additional Comments:  Leeroy Cha, RN 01/03/2015, 10:37 AM

## 2015-01-03 NOTE — Procedures (Signed)
Thoracentesis Procedure Note  Pre-operative Diagnosis: Malignant pleural effusion   Post-operative Diagnosis: same  Indications: Therapeutic relief of dyspnea.  Procedure Details  Consent: Informed consent was obtained. Risks of the procedure were discussed including: infection, bleeding, pain, pneumothorax.  Under sterile conditions the patient was positioned. Betadine solution and sterile drapes were utilized.  1% plain lidocaine was used to anesthetize the 7th rib space. Fluid was obtained without any difficulties and minimal blood loss.  A dressing was applied to the wound and wound care instructions were provided.   Findings 1400 ml of straw colored, clear pleural fluid with fibrinous debris was obtained.   No studies ordered for pleural fluid as known malignant effusion.   Complications:  None; patient tolerated the procedure well.          Condition: stable  Plan A follow up chest x-ray ordered and pending.   Noe Gens, NP-C [facility] Pgr: [phone] or if no answer [phone] 01/03/2015, 10:40 AM

## 2015-01-03 NOTE — Progress Notes (Signed)
Pt discharged to home with family. AVS paperwork discussed with pt including time for next medications to be administered and discharge note about daily BP's taken. Pt V/S stable and pt is not in any acute distress. Pt discharged home with all belongings and taken off the unit via wheelchair.

## 2015-01-04 ENCOUNTER — Other Ambulatory Visit (HOSPITAL_COMMUNITY): Payer: Self-pay | Admitting: Internal Medicine

## 2015-01-04 ENCOUNTER — Other Ambulatory Visit: Payer: Self-pay | Admitting: Internal Medicine

## 2015-01-04 LAB — URINE CULTURE: Culture: NO GROWTH

## 2015-01-06 ENCOUNTER — Other Ambulatory Visit: Payer: Self-pay | Admitting: *Deleted

## 2015-01-06 ENCOUNTER — Telehealth: Payer: Self-pay | Admitting: Medical Oncology

## 2015-01-06 NOTE — Telephone Encounter (Signed)
Dr Roxan Hockey was contacted by Nicole Kindred . Nicole Kindred said he was instructed by Dr Roxan Hockey to give pt the lasix.

## 2015-01-07 ENCOUNTER — Encounter: Payer: BLUE CROSS/BLUE SHIELD | Admitting: Thoracic Surgery (Cardiothoracic Vascular Surgery)

## 2015-01-07 ENCOUNTER — Telehealth: Payer: Self-pay | Admitting: Internal Medicine

## 2015-01-07 ENCOUNTER — Encounter (HOSPITAL_COMMUNITY): Payer: Self-pay | Admitting: *Deleted

## 2015-01-07 ENCOUNTER — Ambulatory Visit: Payer: BLUE CROSS/BLUE SHIELD | Admitting: Thoracic Surgery (Cardiothoracic Vascular Surgery)

## 2015-01-07 LAB — CULTURE, BLOOD (ROUTINE X 2)
CULTURE: NO GROWTH
CULTURE: NO GROWTH

## 2015-01-07 NOTE — Telephone Encounter (Signed)
returned call pt needed to confirm curgery...i fwd call to Dr. Roxan Hockey office

## 2015-01-07 NOTE — Progress Notes (Addendum)
Pt denies SOB and chest pain but is under the care of Dr. Haroldine Laws, cardiology. Pt denies having a stress test and cardiac cath. Pt made aware to stop otc vitamins and herbal medications. Do not take any NSAIDs ie: Ibuprofen, Advil, Naproxen and etc.. Pt verbalized understanding of all pre-op instructions.

## 2015-01-08 ENCOUNTER — Ambulatory Visit (HOSPITAL_COMMUNITY): Payer: BLUE CROSS/BLUE SHIELD

## 2015-01-08 ENCOUNTER — Encounter (HOSPITAL_COMMUNITY)
Admission: RE | Disposition: A | Discharge status: Home / Self Care | Payer: Self-pay | Source: Ambulatory Visit | Attending: Thoracic Surgery (Cardiothoracic Vascular Surgery)

## 2015-01-08 ENCOUNTER — Ambulatory Visit (HOSPITAL_COMMUNITY)
Admission: RE | Admit: 2015-01-08 | Discharge: 2015-01-08 | Disposition: A | Discharge status: Home / Self Care | Payer: BLUE CROSS/BLUE SHIELD | Source: Ambulatory Visit | Attending: Thoracic Surgery (Cardiothoracic Vascular Surgery) | Admitting: Thoracic Surgery (Cardiothoracic Vascular Surgery)

## 2015-01-08 ENCOUNTER — Ambulatory Visit (HOSPITAL_COMMUNITY): Payer: BLUE CROSS/BLUE SHIELD | Admitting: Certified Registered Nurse Anesthetist

## 2015-01-08 ENCOUNTER — Encounter (HOSPITAL_COMMUNITY): Payer: Self-pay | Admitting: *Deleted

## 2015-01-08 DIAGNOSIS — Z85118 Personal history of other malignant neoplasm of bronchus and lung: Secondary | ICD-10-CM | POA: Insufficient documentation

## 2015-01-08 DIAGNOSIS — J449 Chronic obstructive pulmonary disease, unspecified: Secondary | ICD-10-CM | POA: Insufficient documentation

## 2015-01-08 DIAGNOSIS — C3490 Malignant neoplasm of unspecified part of unspecified bronchus or lung: Secondary | ICD-10-CM | POA: Diagnosis not present

## 2015-01-08 DIAGNOSIS — Z87891 Personal history of nicotine dependence: Secondary | ICD-10-CM | POA: Diagnosis not present

## 2015-01-08 DIAGNOSIS — J90 Pleural effusion, not elsewhere classified: Secondary | ICD-10-CM

## 2015-01-08 DIAGNOSIS — J910 Malignant pleural effusion: Secondary | ICD-10-CM | POA: Diagnosis not present

## 2015-01-08 HISTORY — PX: CHEST TUBE INSERTION: SHX231

## 2015-01-08 LAB — COMPREHENSIVE METABOLIC PANEL
ALBUMIN: 2.5 g/dL — AB (ref 3.5–5.0)
ALT: 14 U/L (ref 14–54)
AST: 23 U/L (ref 15–41)
Alkaline Phosphatase: 76 U/L (ref 38–126)
Anion gap: 8 (ref 5–15)
BILIRUBIN TOTAL: 0.7 mg/dL (ref 0.3–1.2)
BUN: 21 mg/dL — AB (ref 6–20)
CO2: 46 mmol/L — ABNORMAL HIGH (ref 22–32)
Calcium: 9.7 mg/dL (ref 8.9–10.3)
Chloride: 82 mmol/L — ABNORMAL LOW (ref 101–111)
Creatinine, Ser: 1.06 mg/dL — ABNORMAL HIGH (ref 0.44–1.00)
GFR calc Af Amer: >60 mL/min (ref >60)
GFR, EST NON AFRICAN AMERICAN: 56 mL/min — AB (ref >60)
Glucose, Bld: 132 mg/dL — ABNORMAL HIGH (ref 65–99)
Potassium: 3.9 mmol/L (ref 3.5–5.1)
Sodium: 136 mmol/L (ref 135–145)
Total Protein: 5.6 g/dL — ABNORMAL LOW (ref 6.5–8.1)

## 2015-01-08 LAB — PROTIME-INR
INR: 0.93 (ref 0.00–1.49)
Prothrombin Time: 12.6 seconds (ref 11.6–15.2)

## 2015-01-08 LAB — CBC
HCT: 40.3 % (ref 36.0–46.0)
Hemoglobin: 12.7 g/dL (ref 12.0–15.0)
MCH: 30.6 pg (ref 26.0–34.0)
MCHC: 31.5 g/dL (ref 30.0–36.0)
MCV: 97.1 fL (ref 78.0–100.0)
Platelets: 417 10*3/uL — ABNORMAL HIGH (ref 150–400)
RBC: 4.15 MIL/uL (ref 3.87–5.11)
RDW: 13.3 % (ref 11.5–15.5)
WBC: 20 10*3/uL — ABNORMAL HIGH (ref 4.0–10.5)

## 2015-01-08 LAB — APTT: APTT: 31 s (ref 24–37)

## 2015-01-08 SURGERY — INSERTION, PLEURAL DRAINAGE CATHETER
Anesthesia: Monitor Anesthesia Care | Site: Chest | Laterality: Right

## 2015-01-08 MED ORDER — MIDAZOLAM HCL 2 MG/2ML IJ SOLN
INTRAMUSCULAR | Status: AC
  Filled 2015-01-08: qty 4

## 2015-01-08 MED ORDER — LACTATED RINGERS IV SOLN: INTRAVENOUS | Status: DC

## 2015-01-08 MED ORDER — ONDANSETRON HCL 4 MG/2ML IJ SOLN
INTRAMUSCULAR | Status: AC
  Filled 2015-01-08: qty 2

## 2015-01-08 MED ORDER — ONDANSETRON HCL 4 MG/2ML IJ SOLN: 4.0000 mg | Freq: Once | INTRAMUSCULAR | Status: DC | PRN

## 2015-01-08 MED ORDER — MIDAZOLAM HCL 5 MG/5ML IJ SOLN
INTRAMUSCULAR | Status: DC | PRN
  Administered 2015-01-08: 2 mg via INTRAVENOUS

## 2015-01-08 MED ORDER — PROPOFOL 10 MG/ML IV BOLUS
INTRAVENOUS | Status: AC
  Filled 2015-01-08: qty 20

## 2015-01-08 MED ORDER — FENTANYL CITRATE (PF) 250 MCG/5ML IJ SOLN
INTRAMUSCULAR | Status: AC
  Filled 2015-01-08: qty 5

## 2015-01-08 MED ORDER — LIDOCAINE HCL 1 % IJ SOLN
INTRAMUSCULAR | Status: DC | PRN
  Administered 2015-01-08: 10 mL via INTRADERMAL

## 2015-01-08 MED ORDER — LACTATED RINGERS IV SOLN
INTRAVENOUS | Status: DC | PRN
  Administered 2015-01-08: 17:00:00 via INTRAVENOUS

## 2015-01-08 MED ORDER — LIDOCAINE HCL (CARDIAC) 20 MG/ML IV SOLN
INTRAVENOUS | Status: AC
  Filled 2015-01-08: qty 5

## 2015-01-08 MED ORDER — PROPOFOL 10 MG/ML IV BOLUS
INTRAVENOUS | Status: DC | PRN
  Administered 2015-01-08: 20 mg via INTRAVENOUS

## 2015-01-08 MED ORDER — FENTANYL CITRATE (PF) 100 MCG/2ML IJ SOLN
INTRAMUSCULAR | Status: DC | PRN
  Administered 2015-01-08: 100 ug via INTRAVENOUS

## 2015-01-08 MED ORDER — DEXTROSE 5 % IV SOLN
1.5000 g | INTRAVENOUS | Status: AC
  Administered 2015-01-08: 1.5 g via INTRAVENOUS
  Filled 2015-01-08: qty 1.5

## 2015-01-08 MED ORDER — METOPROLOL SUCCINATE ER 25 MG PO TB24
25.0000 mg | ORAL_TABLET | Freq: Once | ORAL | Status: AC
  Administered 2015-01-08: 25 mg via ORAL
  Filled 2015-01-08: qty 1

## 2015-01-08 MED ORDER — FENTANYL CITRATE (PF) 100 MCG/2ML IJ SOLN: 25.0000 ug | INTRAMUSCULAR | Status: DC | PRN

## 2015-01-08 SURGICAL SUPPLY — 27 items
ADH SKN CLS APL DERMABOND .7 (GAUZE/BANDAGES/DRESSINGS) ×1
CANISTER SUCTION 2500CC (MISCELLANEOUS) ×3 IMPLANT
COVER SURGICAL LIGHT HANDLE (MISCELLANEOUS) ×3 IMPLANT
DERMABOND ADVANCED (GAUZE/BANDAGES/DRESSINGS) ×2
DERMABOND ADVANCED .7 DNX12 (GAUZE/BANDAGES/DRESSINGS) ×1 IMPLANT
DRAPE C-ARM 42X72 X-RAY (DRAPES) ×3 IMPLANT
DRAPE LAPAROSCOPIC ABDOMINAL (DRAPES) ×3 IMPLANT
GLOVE SURG SIGNA 7.5 PF LTX (GLOVE) ×3 IMPLANT
GOWN STRL REUS W/ TWL LRG LVL3 (GOWN DISPOSABLE) ×1 IMPLANT
GOWN STRL REUS W/ TWL XL LVL3 (GOWN DISPOSABLE) ×1 IMPLANT
GOWN STRL REUS W/TWL LRG LVL3 (GOWN DISPOSABLE) ×3
GOWN STRL REUS W/TWL XL LVL3 (GOWN DISPOSABLE) ×3
KIT BASIN OR (CUSTOM PROCEDURE TRAY) ×3 IMPLANT
KIT PLEURX DRAIN CATH 1000ML (MISCELLANEOUS) ×3 IMPLANT
KIT PLEURX DRAIN CATH 15.5FR (DRAIN) ×3 IMPLANT
KIT ROOM TURNOVER OR (KITS) ×3 IMPLANT
NS IRRIG 1000ML POUR BTL (IV SOLUTION) ×3 IMPLANT
PACK GENERAL/GYN (CUSTOM PROCEDURE TRAY) ×3 IMPLANT
PAD ARMBOARD 7.5X6 YLW CONV (MISCELLANEOUS) ×6 IMPLANT
SET DRAINAGE LINE (MISCELLANEOUS) IMPLANT
SPONGE GAUZE 4X4 12PLY STER LF (GAUZE/BANDAGES/DRESSINGS) ×3 IMPLANT
SUT ETHILON 3 0 FSL (SUTURE) ×3 IMPLANT
SUT VIC AB 3-0 X1 27 (SUTURE) ×3 IMPLANT
TOWEL OR 17X24 6PK STRL BLUE (TOWEL DISPOSABLE) ×3 IMPLANT
TOWEL OR 17X26 10 PK STRL BLUE (TOWEL DISPOSABLE) ×3 IMPLANT
VALVE REPLACEMENT CAP (MISCELLANEOUS) IMPLANT
WATER STERILE IRR 1000ML POUR (IV SOLUTION) ×3 IMPLANT

## 2015-01-08 NOTE — Anesthesia Postprocedure Evaluation (Signed)
  Anesthesia Post-op Note  Patient: Linda Donovan  Procedure(s) Performed: Procedure(s): INSERTION PLEURAL DRAINAGE CATHETER (Right)  Patient Location: PACU  Anesthesia Type:MAC  Level of Consciousness: awake, alert  and oriented  Airway and Oxygen Therapy: Patient Spontanous Breathing  Post-op Pain: none  Post-op Assessment: Post-op Vital signs reviewed, Patient's Cardiovascular Status Stable, Respiratory Function Stable and Patent Airway              Post-op Vital Signs: stable  Last Vitals:  Filed Vitals:   01/08/15 1815  BP:   Pulse: 83  Temp:   Resp: 18    Complications: No apparent anesthesia complications

## 2015-01-08 NOTE — Discharge Instructions (Addendum)
Do not drive or engage in heavy physical activity for 24 hours  You may shower with the dressing over the catheter  We will arrange for a home health nurse to do drainage/ education  You may use the percocet you already have as needed for pain  My office will contact you with a follow up appointment in 2-3 weeks

## 2015-01-08 NOTE — Brief Op Note (Signed)
01/08/2015  5:44 PM  PATIENT:  Linda Donovan  59 y.o. female  PRE-OPERATIVE DIAGNOSIS:  RIGHT PLEURAL EFFUSION  POST-OPERATIVE DIAGNOSIS:  RIGHT PLEURAL EFFUSION  PROCEDURE:  Procedure(s): INSERTION PLEURAL DRAINAGE CATHETER (Right)  SURGEON:  Surgeon(s) and Role:    * [name], MD - Primary  PHYSICIAN ASSISTANT:   ASSISTANTS: none   ANESTHESIA:   local and IV sedation  EBL:  Total I/O In: 500 [I.V.:500] Out: -   BLOOD ADMINISTERED:none  DRAINS: right pleural catheter   LOCAL MEDICATIONS USED:  LIDOCAINE  and Amount: 10 ml  SPECIMEN:  Source of Specimen:  right pleural fluid  DISPOSITION OF SPECIMEN:  N/A  COUNTS:  YES  PLAN OF CARE: Discharge to home after PACU  PATIENT DISPOSITION:  PACU - hemodynamically stable.   Delay start of Pharmacological VTE agent (>24hrs) due to surgical blood loss or risk of bleeding: not applicable  384 ml of straw colored fluid evacuated. Small lateral loculated effusion not drained

## 2015-01-08 NOTE — H&P (View-Only) (Signed)
Reason for Consult:Malignant pleural effusion Referring Physician: Dr. Vanessa [place] Linda Donovan is an 59 y.o. female.  HPI: 59 yo woman with metastatic lung cancer who presents with a rapidly recurring right pleural effusion. I did a pericardial window on her a year ago for a pericardial effusion. She had her most recent thoracentesis about a week ago. She was becoming more short of breath and was admitted yesterday. She had 1.4 L of serous fluid drained today and feels better postdrainage.  Past Medical History  Diagnosis Date  . COPD (chronic obstructive pulmonary disease)   . ADD (attention deficit disorder)   . Hx of radiation therapy 09/28/12- 11/17/12    RU lung mass, mediastinum chest, 63 gray 35 fx  . Pneumonia   . Arthritis   . Non-small cell lung cancer 09/18/2012    RUL  . Asthma   . Malignant pleural effusion     Past Surgical History  Procedure Laterality Date  . Total abdominal hysterectomy    . Shoulder arthroscopy    . Video bronchoscopy Bilateral 07/25/2012    Procedure: VIDEO BRONCHOSCOPY WITH FLUORO;  Surgeon: Tanda Rockers, MD;  Location: [facility];  Service: Endoscopy;  Laterality: Bilateral;  . Lump removed from breasr right  2003  . Endobronchial ultrasound Bilateral 09/04/2012    Procedure: ENDOBRONCHIAL ULTRASOUND;  Surgeon: Collene Gobble, MD;  Location: [facility];  Service: Cardiopulmonary;  Laterality: Bilateral;  . Breast surgery    . Subxyphoid pericardial window N/A 01/04/2014    Procedure: SUBXYPHOID PERICARDIAL WINDOW;  Surgeon: [name], MD;  Location: [facility];  Service: Thoracic;  Laterality: N/A;  . Intraoperative transesophageal echocardiogram N/A 01/04/2014    Procedure: INTRAOPERATIVE TRANSESOPHAGEAL ECHOCARDIOGRAM;  Surgeon: [name], MD;  Location: [facility];  Service: Open Heart Surgery;  Laterality: N/A;    Family History  Problem Relation Age of Onset  . COPD Paternal Uncle   . Heart disease Mother   . Heart disease  Maternal Grandmother   . Cancer Paternal Grandmother     breast  . Prostate cancer Maternal Grandfather   . Cancer Maternal Grandfather     prostate    Social History:  reports that she quit smoking about 18 years ago. Her smoking use included Cigarettes. She has a 20 pack-year smoking history. She has never used smokeless tobacco. She reports that she drinks about 0.6 oz of alcohol per week. She reports that she does not use illicit drugs.  Allergies:  Allergies  Allergen Reactions  . Clinoril [Sulindac] Hives  . Morphine Itching    Medications:  Prior to Admission:  No prescriptions prior to admission    Results for orders placed or performed during the hospital encounter of 01/02/15 (from the past 48 hour(s))  Basic metabolic panel     Status: Abnormal   Collection Time: 01/02/15  5:00 PM  Result Value Ref Range   Sodium 133 (L) 135 - 145 mmol/L   Potassium 4.5 3.5 - 5.1 mmol/L   Chloride 79 (L) 101 - 111 mmol/L   CO2 45 (H) 22 - 32 mmol/L   Glucose, Bld 148 (H) 65 - 99 mg/dL   BUN 9 6 - 20 mg/dL   Creatinine, Ser 0.46 0.44 - 1.00 mg/dL   Calcium 9.6 8.9 - 10.3 mg/dL   GFR calc non Af Amer >60 >60 mL/min   GFR calc Af Amer >60 >60 mL/min    Comment: (NOTE) The eGFR has been calculated using the CKD  EPI equation. This calculation has not been validated in all clinical situations. eGFR's persistently <60 mL/min signify possible Chronic Kidney Disease.    Anion gap 9 5 - 15  Brain natriuretic peptide     Status: Abnormal   Collection Time: 01/02/15  5:00 PM  Result Value Ref Range   B Natriuretic Peptide 100.5 (H) 0.0 - 100.0 pg/mL  CBC with Differential     Status: Abnormal   Collection Time: 01/02/15  5:00 PM  Result Value Ref Range   WBC 22.0 (H) 4.0 - 10.5 K/uL   RBC 4.63 3.87 - 5.11 MIL/uL   Hemoglobin 14.3 12.0 - 15.0 g/dL   HCT 46.8 (H) 36.0 - 46.0 %   MCV 101.1 (H) 78.0 - 100.0 fL   MCH 30.9 26.0 - 34.0 pg   MCHC 30.6 30.0 - 36.0 g/dL   RDW 12.9 11.5 -  15.5 %   Platelets 544 (H) 150 - 400 K/uL   Neutrophils Relative % 92 (H) 43 - 77 %   Neutro Abs 20.2 (H) 1.7 - 7.7 K/uL   Lymphocytes Relative 2 (L) 12 - 46 %   Lymphs Abs 0.4 (L) 0.7 - 4.0 K/uL   Monocytes Relative 6 3 - 12 %   Monocytes Absolute 1.3 (H) 0.1 - 1.0 K/uL   Eosinophils Relative 0 0 - 5 %   Eosinophils Absolute 0.0 0.0 - 0.7 K/uL   Basophils Relative 0 0 - 1 %   Basophils Absolute 0.0 0.0 - 0.1 K/uL  I-stat troponin, ED     Status: None   Collection Time: 01/02/15  5:36 PM  Result Value Ref Range   Troponin i, poc 0.01 0.00 - 0.08 ng/mL   Comment 3            Comment: Due to the release kinetics of cTnI, a negative result within the first hours of the onset of symptoms does not rule out myocardial infarction with certainty. If myocardial infarction is still suspected, repeat the test at appropriate intervals.   Blood gas, arterial     Status: Abnormal   Collection Time: 01/02/15  7:27 PM  Result Value Ref Range   O2 Content 4.0 L/min   Delivery systems NASAL CANNULA    pH, Arterial 7.335 (L) 7.350 - 7.450   pCO2 arterial 95.1 (HH) 35.0 - 45.0 mmHg    Comment: CRITICAL RESULT CALLED TO, READ BACK BY AND VERIFIED WITH: Roxine Caddy, MD AT [place], RRT, RCP ON 01/02/15    pO2, Arterial 70.9 (L) 80.0 - 100.0 mmHg   Bicarbonate 49.3 (H) 20.0 - 24.0 mEq/L   TCO2 45.0 0 - 100 mmol/L   Acid-Base Excess 18.9 (H) 0.0 - 2.0 mmol/L   O2 Saturation 92.5 %   Patient temperature 98.6    Collection site LEFT RADIAL    Drawn by [id number]    Sample type ARTERIAL DRAW    Allens test (pass/fail) PASS PASS  I-Stat CG4 Lactic Acid, ED     Status: None   Collection Time: 01/02/15  7:30 PM  Result Value Ref Range   Lactic Acid, Venous 0.75 0.5 - 2.0 mmol/L  Urinalysis, Routine w reflex microscopic (not at [facility])     Status: Abnormal   Collection Time: 01/02/15  9:53 PM  Result Value Ref Range   Color, Urine AMBER (A) YELLOW    Comment: BIOCHEMICALS MAY BE AFFECTED BY COLOR    APPearance CLOUDY (A) CLEAR   Specific Gravity, Urine 1.033 (  H) 1.005 - 1.030   pH 5.5 5.0 - 8.0   Glucose, UA NEGATIVE NEGATIVE mg/dL   Hgb urine dipstick NEGATIVE NEGATIVE   Bilirubin Urine NEGATIVE NEGATIVE   Ketones, ur NEGATIVE NEGATIVE mg/dL   Protein, ur NEGATIVE NEGATIVE mg/dL   Urobilinogen, UA 0.2 0.0 - 1.0 mg/dL   Nitrite NEGATIVE NEGATIVE   Leukocytes, UA TRACE (A) NEGATIVE  Urine microscopic-add on     Status: Abnormal   Collection Time: 01/02/15  9:53 PM  Result Value Ref Range   Squamous Epithelial / LPF MANY (A) RARE   WBC, UA 0-2 <3 WBC/hpf   Bacteria, UA FEW (A) RARE   Casts HYALINE CASTS (A) NEGATIVE   Crystals CA OXALATE CRYSTALS (A) NEGATIVE   Urine-Other MUCOUS PRESENT   Culture, blood (routine x 2)     Status: None (Preliminary result)   Collection Time: 01/02/15 10:00 PM  Result Value Ref Range   Specimen Description BLOOD LEFT FOREARM    Special Requests BOTTLES DRAWN AEROBIC ONLY 5CC    Culture      NO GROWTH < 24 HOURS Performed at [facility]    Report Status PENDING   Culture, blood (routine x 2)     Status: None (Preliminary result)   Collection Time: 01/02/15 10:03 PM  Result Value Ref Range   Specimen Description BLOOD RIGHT HAND    Special Requests BOTTLES DRAWN AEROBIC ONLY 3CC    Culture      NO GROWTH < 24 HOURS Performed at [facility]    Report Status PENDING   MRSA PCR Screening     Status: None   Collection Time: 01/03/15 12:42 AM  Result Value Ref Range   MRSA by PCR NEGATIVE NEGATIVE    Comment:        The GeneXpert MRSA Assay (FDA approved for NASAL specimens only), is one component of a comprehensive MRSA colonization surveillance program. It is not intended to diagnose MRSA infection nor to guide or monitor treatment for MRSA infections.   Comprehensive metabolic panel     Status: Abnormal   Collection Time: 01/03/15  4:27 AM  Result Value Ref Range   Sodium 132 (L) 135 - 145 mmol/L   Potassium  3.9 3.5 - 5.1 mmol/L   Chloride 78 (L) 101 - 111 mmol/L   CO2 47 (H) 22 - 32 mmol/L   Glucose, Bld 141 (H) 65 - 99 mg/dL   BUN 8 6 - 20 mg/dL   Creatinine, Ser <6.78 (L) 0.44 - 1.00 mg/dL    Comment: CORRECTED ON 07/29 AT 0631: PREVIOUSLY REPORTED AS 0.30   Calcium 9.2 8.9 - 10.3 mg/dL   Total Protein 6.1 (L) 6.5 - 8.1 g/dL   Albumin 2.7 (L) 3.5 - 5.0 g/dL   AST 17 15 - 41 U/L   ALT 16 14 - 54 U/L   Alkaline Phosphatase 66 38 - 126 U/L   Total Bilirubin 0.8 0.3 - 1.2 mg/dL   GFR calc non Af Amer NOT CALCULATED >60 mL/min    Comment: CORRECTED ON 07/29 AT 0631: PREVIOUSLY REPORTED AS >60   GFR calc Af Amer NOT CALCULATED >60 mL/min    Comment: (NOTE) The eGFR has been calculated using the CKD EPI equation. This calculation has not been validated in all clinical situations. eGFR's persistently <60 mL/min signify possible Chronic Kidney Disease. CORRECTED ON 07/29 AT 0631: PREVIOUSLY REPORTED AS >60    Anion gap 7 5 - 15  CBC  Status: Abnormal   Collection Time: 01/03/15  4:27 AM  Result Value Ref Range   WBC 20.2 (H) 4.0 - 10.5 K/uL   RBC 4.10 3.87 - 5.11 MIL/uL   Hemoglobin 12.6 12.0 - 15.0 g/dL   HCT 40.7 36.0 - 46.0 %   MCV 99.3 78.0 - 100.0 fL   MCH 30.7 26.0 - 34.0 pg   MCHC 31.0 30.0 - 36.0 g/dL   RDW 13.0 11.5 - 15.5 %   Platelets 457 (H) 150 - 400 K/uL  Protime-INR     Status: None   Collection Time: 01/03/15  4:27 AM  Result Value Ref Range   Prothrombin Time 13.7 11.6 - 15.2 seconds   INR 1.03 0.00 - 1.49    Dg Chest 2 View  01/02/2015   CLINICAL DATA:  History of lung carcinoma, short of breath over the last several days, history of recurring pleural effusions  EXAM: CHEST  2 VIEW  COMPARISON:  Chest x-ray of 12/25/2014 and CT chest of 11/26/2014  FINDINGS: There is now more opacity at the right lung base most consistent with recurrent right pleural effusion and postoperative change with volume loss. Poor aeration of the right upper lung field again is noted.  The left lung is clear and somewhat hyperaerated. Heart size is stable. No bony abnormality is seen.  IMPRESSION: More opacity at the right lung base most consistent with increasing right pleural effusion.   Electronically Signed   By: Ivar Drape M.D.   On: 01/02/2015 14:25   [place] Chest  01/02/2015   CLINICAL DATA:  Right pleural effusion.  EXAM: CHEST ULTRASOUND  COMPARISON:  Chest x-ray dated 01/02/2015  FINDINGS: There is a large pocket of pleural fluid seen posteriorly in the right hemi thorax. However, because of hypotension, thoracentesis was not performed.  IMPRESSION: There is a large pocket of pleural fluid in the right hemi thorax. Thoracentesis not performed because of hypotension.   Electronically Signed   By: Lorriane Shire M.D.   On: 01/02/2015 17:04   Dg Chest Port 1 View  01/03/2015   CLINICAL DATA:  Pleural effusion, cough, lung cancer, emphysema  EXAM: PORTABLE CHEST - 1 VIEW  COMPARISON:  12/25/2014, CT chest 11/26/2014, chest x-ray 12/18/2014  FINDINGS: Persistent right upper lobe airspace opacity consistent with known malignancy. There is patchy right upper lobe airspace disease most concerning for postobstructive pneumonitis. There is a small right pleural effusion. There is no left pleural effusion, pneumothorax or focal consolidation. There is right lung volume loss. Stable cardiomediastinal silhouette. No acute osseous abnormality.  IMPRESSION: 1. Persistent right upper lobe airspace opacity consistent with known malignancy. 2. Patchy right upper lobe airspace disease concerning for postobstructive pneumonitis. 3. Small right pleural effusion.   Electronically Signed   By: Kathreen Devoid   On: 01/03/2015 11:15    Review of Systems  Respiratory: Positive for shortness of breath.   Cardiovascular: Positive for orthopnea.   Blood pressure 131/52, pulse 85, temperature 98.6 F (37 C), temperature source Oral, resp. rate 18, height $RemoveBe'5\' 3"'YhnwGFHWw$  (1.6 m), weight 130 lb 1.1 oz (59 kg), SpO2 95  %. Physical Exam  Vitals reviewed. Constitutional: She is oriented to person, place, and time. She appears distressed (mildly).  HENT:  Head: Normocephalic and atraumatic.  Eyes: Conjunctivae and EOM are normal. No scleral icterus.  Neck: Neck supple. No tracheal deviation present.  Cardiovascular: Normal rate, regular rhythm and normal heart sounds.   Respiratory: Effort normal.  Absent BS right apex  GI: Soft. There is no tenderness.  Musculoskeletal: She exhibits no edema.  Lymphadenopathy:    She has no cervical adenopathy.  Neurological: She is alert and oriented to person, place, and time. No cranial nerve deficit.  Skin: Skin is warm and dry.    Assessment/Plan: Mrs. Clapp is a 59 yo woman with a malignant right pleural effusion that is recurring rapidly and has required thoracentesis weekly for the past 3 weeks, 4 thoracenteses altogether.  She presented with progressive shortness of breath and her chest xray showed a large right effusion. She had a thoracentesis this morning with 1.4 L drained. She feels better post drainage. The effusion is highly likely to recur.  I discussed placement of a pleural catheter for management of the effusion with the patient and her family. I described the general nature of the procedure and management of the catheter postop. I informed them of the indications, risks, benefits and alternatives. They understand the risks include bleeding, infection, catheter malposition and catheter occlusion. She understands and accepts the risks and agrees to proceed.   Plan OR next Wednesday.  She will be discharged later today   [name] 01/03/2015, 5:09 PM

## 2015-01-08 NOTE — Care Management Note (Signed)
Case Management Note  Patient Details  Name: Linda Donovan MRN: [id number] Date of Birth: [birth year]  Subjective/Objective:    Patient seen in PACU for pleurx tube placement.                Action/Plan:  EDCM consulted to arrange home health RN for pleurx tube care.   Expected Discharge Date:   01/08/2015               Expected Discharge Plan:  [place]  In-House Referral:  NA  Discharge planning Services  CM Consult  Post Acute Care Choice:  Home Health Choice offered to:  Patient  DME Arranged:   none DME Agency:     HH Arranged:  RN [place] Agency:  [place]  Status of Service:  Completed, signed off  Medicare Important Message Given:    Date Medicare IM Given:    Medicare IM give by:    Date Additional Medicare IM Given:    Additional Medicare Important Message give by:     If discussed at [place] of Stay Meetings, dates discussed:    Additional Comments:  EDCM received consult from Marlana Latus in PACU [facility] for home health RN for pleurx tube care at home.  Per, Hoyle Sauer pleurx tube to be drained every other day, so home health RN would have to see patient on Friday.  Hoyle Sauer reports patient is being discharged with pleurx supplies/cannisters.  Hoyle Sauer has also completed [employer] forms and faxed to [phone] so that patient may have pluerx supplies mailed to her.  Hoyle Sauer asked patient if she had a preference to which home health agency she would like to choose, patient without preference.  EDCM provided Hoyle Sauer RN with phone number to [place] to provide patient.  EDCM informed Hoyle Sauer to tell patient [facility] has 24-48 hours to contact her.  [facility] faxed referral for home health RN to drain pleurx tube every other day to [facility] with confirmation of receipt.  No further EDCM needs at this time.  Christen Bame, Shandel Busic, RN 01/08/2015, 9:11 PM

## 2015-01-08 NOTE — Transfer of Care (Signed)
Immediate Anesthesia Transfer of Care Note  Patient: Linda Donovan  Procedure(s) Performed: Procedure(s): INSERTION PLEURAL DRAINAGE CATHETER (Right)  Patient Location: PACU  Anesthesia Type:MAC  Level of Consciousness: awake, alert  and oriented  Airway & Oxygen Therapy: Patient Spontanous Breathing and Patient connected to nasal cannula oxygen  Post-op Assessment: Report given to RN, Post -op Vital signs reviewed and stable and Patient moving all extremities X 4  Post vital signs: Reviewed and stable  Last Vitals:  Filed Vitals:   01/08/15 1739  BP:   Pulse:   Temp: 36.6 C  Resp:     Complications: No apparent anesthesia complications

## 2015-01-08 NOTE — Anesthesia Preprocedure Evaluation (Signed)
Anesthesia Evaluation  Patient identified by MRN, date of birth, ID band Patient awake    Reviewed: Allergy & Precautions, NPO status , Patient's Chart, lab work & pertinent test results  Airway Mallampati: II  TM Distance: >3 FB Neck ROM: Full    Dental  (+) Teeth Intact   Pulmonary former smoker,    + decreased breath sounds      Cardiovascular Rhythm:Irregular Rate:Normal     Neuro/Psych    GI/Hepatic   Endo/Other    Renal/GU      Musculoskeletal   Abdominal   Peds  Hematology   Anesthesia Other Findings   Reproductive/Obstetrics                             Anesthesia Physical Anesthesia Plan  ASA: III  Anesthesia Plan: MAC   Post-op Pain Management:    Induction: Intravenous  Airway Management Planned: Natural Airway and Simple Face Mask  Additional Equipment:   Intra-op Plan:   Post-operative Plan:   Informed Consent: I have reviewed the patients History and Physical, chart, labs and discussed the procedure including the risks, benefits and alternatives for the proposed anesthesia with the patient or authorized representative who has indicated his/her understanding and acceptance.     Plan Discussed with: CRNA and Anesthesiologist  Anesthesia Plan Comments:         Anesthesia Quick Evaluation

## 2015-01-08 NOTE — Interval H&P Note (Signed)
History and Physical Interval Note:  01/08/2015 4:03 PM  Linda Donovan  has presented today for surgery, with the diagnosis of RIGHT PLEURAL EFFUSION  The various methods of treatment have been discussed with the patient and family. After consideration of risks, benefits and other options for treatment, the patient has consented to  Procedure(s): INSERTION PLEURAL DRAINAGE CATHETER (Right) as a surgical intervention .  The patient's history has been reviewed, patient examined, no change in status, stable for surgery.  I have reviewed the patient's chart and labs.  Questions were answered to the patient's satisfaction.     [name]

## 2015-01-08 NOTE — Anesthesia Procedure Notes (Signed)
Procedure Name: MAC Date/Time: 01/08/2015 5:10 PM Performed by: Trixie Deis A Oxygen Delivery Method: Simple face mask Placement Confirmation: positive ETCO2

## 2015-01-08 NOTE — Progress Notes (Signed)
Spoke with Case Management on call named Amy - filled out [employer] forms via phone with Amy and faxed forms regarding [place] care. Canisters sent home with patient.

## 2015-01-09 ENCOUNTER — Telehealth: Payer: Self-pay | Admitting: *Deleted

## 2015-01-09 ENCOUNTER — Other Ambulatory Visit (HOSPITAL_BASED_OUTPATIENT_CLINIC_OR_DEPARTMENT_OTHER): Payer: BLUE CROSS/BLUE SHIELD

## 2015-01-09 ENCOUNTER — Encounter: Payer: Self-pay | Admitting: Internal Medicine

## 2015-01-09 ENCOUNTER — Telehealth: Payer: Self-pay | Admitting: Internal Medicine

## 2015-01-09 ENCOUNTER — Ambulatory Visit (HOSPITAL_BASED_OUTPATIENT_CLINIC_OR_DEPARTMENT_OTHER): Payer: BLUE CROSS/BLUE SHIELD

## 2015-01-09 ENCOUNTER — Ambulatory Visit (HOSPITAL_BASED_OUTPATIENT_CLINIC_OR_DEPARTMENT_OTHER): Payer: BLUE CROSS/BLUE SHIELD | Admitting: Internal Medicine

## 2015-01-09 ENCOUNTER — Other Ambulatory Visit: Payer: Self-pay | Admitting: Internal Medicine

## 2015-01-09 VITALS — BP 100/61 | HR 97 | Temp 97.8°F | Resp 16 | Ht 63.0 in | Wt 125.8 lb

## 2015-01-09 DIAGNOSIS — C3491 Malignant neoplasm of unspecified part of right bronchus or lung: Secondary | ICD-10-CM

## 2015-01-09 DIAGNOSIS — J90 Pleural effusion, not elsewhere classified: Secondary | ICD-10-CM | POA: Diagnosis not present

## 2015-01-09 DIAGNOSIS — Z5112 Encounter for antineoplastic immunotherapy: Secondary | ICD-10-CM

## 2015-01-09 DIAGNOSIS — C3490 Malignant neoplasm of unspecified part of unspecified bronchus or lung: Secondary | ICD-10-CM

## 2015-01-09 LAB — COMPREHENSIVE METABOLIC PANEL (CC13)
ALT: 16 U/L (ref 0–55)
ANION GAP: 11 meq/L (ref 3–11)
AST: 16 U/L (ref 5–34)
Albumin: 2.4 g/dL — ABNORMAL LOW (ref 3.5–5.0)
Alkaline Phosphatase: 80 U/L (ref 40–150)
BUN: 18.7 mg/dL (ref 7.0–26.0)
CO2: >45 mEq/L — ABNORMAL HIGH (ref 22–29)
CREATININE: 0.9 mg/dL (ref 0.6–1.1)
Calcium: 10.2 mg/dL (ref 8.4–10.4)
Chloride: 82 mEq/L — ABNORMAL LOW (ref 98–109)
EGFR: 66 mL/min/{1.73_m2} — ABNORMAL LOW (ref >90)
Glucose: 192 mg/dl — ABNORMAL HIGH (ref 70–140)
Potassium: 4 mEq/L (ref 3.5–5.1)
Sodium: 138 mEq/L (ref 136–145)
TOTAL PROTEIN: 6 g/dL — AB (ref 6.4–8.3)
Total Bilirubin: 0.31 mg/dL (ref 0.20–1.20)

## 2015-01-09 LAB — CBC WITH DIFFERENTIAL/PLATELET
BASO%: 0.2 % (ref 0.0–2.0)
BASOS ABS: 0 10*3/uL (ref 0.0–0.1)
EOS ABS: 0.8 10*3/uL — AB (ref 0.0–0.5)
EOS%: 4.4 % (ref 0.0–7.0)
HCT: 41.9 % (ref 34.8–46.6)
HEMOGLOBIN: 13.1 g/dL (ref 11.6–15.9)
LYMPH%: 2 % — ABNORMAL LOW (ref 14.0–49.7)
MCH: 30.8 pg (ref 25.1–34.0)
MCHC: 31.3 g/dL — AB (ref 31.5–36.0)
MCV: 98.6 fL (ref 79.5–101.0)
MONO#: 1.5 10*3/uL — ABNORMAL HIGH (ref 0.1–0.9)
MONO%: 8.2 % (ref 0.0–14.0)
NEUT%: 85.2 % — ABNORMAL HIGH (ref 38.4–76.8)
NEUTROS ABS: 15.6 10*3/uL — AB (ref 1.5–6.5)
PLATELETS: 387 10*3/uL (ref 145–400)
RBC: 4.25 10*6/uL (ref 3.70–5.45)
RDW: 13.2 % (ref 11.2–14.5)
WBC: 18.3 10*3/uL — ABNORMAL HIGH (ref 3.9–10.3)
lymph#: 0.4 10*3/uL — ABNORMAL LOW (ref 0.9–3.3)

## 2015-01-09 MED ORDER — SODIUM CHLORIDE 0.9 % IV SOLN
3.0000 mg/kg | Freq: Once | INTRAVENOUS | Status: AC
  Administered 2015-01-09: 180 mg via INTRAVENOUS
  Filled 2015-01-09: qty 18

## 2015-01-09 MED ORDER — SODIUM CHLORIDE 0.9 % IV SOLN
Freq: Once | INTRAVENOUS | Status: AC
  Administered 2015-01-09: 12:00:00 via INTRAVENOUS

## 2015-01-09 NOTE — Telephone Encounter (Signed)
per pof to sch pt appt-gave pt copy of avs-sent Dr Julien Nordmann staff message to adv no opening on his sch on 8/18-adv pt was sch w/Adrena and to adv

## 2015-01-09 NOTE — Progress Notes (Signed)
    [facility] [phone]   [phone]  OFFICE PROGRESS NOTE   DIAGNOSIS: Recurrent non-small cell lung cancer initially diagnosed as Stage IIIA non-small cell lung cancer, adenocarcinoma with negative EGFR mutation and negative ALK gene translocation diagnosed in March of 2014   PRIOR THERAPY:  1) Concurrent chemoradiation with weekly carboplatin for AUC of 2 and paclitaxel 45 mg/M2, status post 8 cycles, last dose was given on 11/13/2012 with partial response.  2) Consolidation chemotherapy with carboplatin for AUC of 5 on day 1 and gemcitabine 1000 mg/M2 on days 1 and 8 every 3 weeks, status post 3 cycles, last dose was given 02/20/2013 with mild improvement in her disease . First cycle was given on 01/02/2013.  3) status post subxiphoid pericardial window under the care of Dr. Hendrickson on 01/04/2014 and the final pathology showed no evidence for malignancy. 4) Systemic chemotherapy with carboplatin for AUC of 5 and Alimta 500 MG/M2 every 3 weeks. First dose 08/20/2014 discontinued secondary to intolerance  CURRENT THERAPY: Immunotherapy with Nivolumab 3 MG/KG every 2 weeks, first dose 11/28/2014. Status post 3 cycles.  CHEMOTHERAPY INTENT: Control  CURRENT # OF CHEMOTHERAPY CYCLES: 4 CURRENT ANTIEMETICS: Zofran, dexamethasone and Compazine  CURRENT SMOKING STATUS: Former smoker, quit in 1990  ORAL CHEMOTHERAPY AND CONSENT: None  CURRENT BISPHOSPHONATES USE: None  PAIN MANAGEMENT: 8/10, currently Percocet 5/325, 1-2 tabs q 4 hours  NARCOTICS INDUCED CONSTIPATION: None  LIVING WILL AND CODE STATUS: Full code  INTERVAL HISTORY: Linda Donovan 59 y.o. female returns to the clinic today for followup visit accompanied by her husband. The patient is currently on treatment with immunotherapy with Nivolumab status post 3 cycles and tolerating her treatment well. She continues to have shortness of breath and currently on home oxygen currently on 4  L/minute. The patient also has dry cough. She had several right-sided thoracentesis over the last few weeks. She was recently admitted to [facility] with shortness of breath as well as hypotension. The patient underwent right Pleurx catheter placement by Dr. Hendrickson yesterday. She denied having any significant chest pain or hemoptysis. She has no significant weight loss or night sweats. She has no fever or chills. She has no nausea or vomiting. She is here today to start cycle #4 of her immunotherapy.  MEDICAL HISTORY: Past Medical History  Diagnosis Date  . COPD (chronic obstructive pulmonary disease)   . ADD (attention deficit disorder)   . Hx of radiation therapy 09/28/12- 11/17/12    RU lung mass, mediastinum chest, 63 gray 35 fx  . Pneumonia   . Arthritis   . Non-small cell lung cancer 09/18/2012    RUL  . Asthma   . Malignant pleural effusion     ALLERGIES:  is allergic to clinoril and morphine.  MEDICATIONS:  Current Outpatient Prescriptions  Medication Sig Dispense Refill  . ALPRAZolam (XANAX) 1 MG tablet Take 1 tablet (1 mg total) by mouth 3 (three) times daily. 90 tablet 0  . amitriptyline (ELAVIL) 50 MG tablet Take 1 tablet (50 mg total) by mouth daily. 30 tablet 1  . aspirin 81 MG tablet Take 81 mg by mouth at bedtime.     . benzonatate (TESSALON) 200 MG capsule Take 200 mg by mouth 3 (three) times daily.   2  . Besifloxacin HCl (BESIVANCE) 0.6 % SUSP Place 1 drop into the left eye 2 (two) times daily.    . Bromfenac Sodium (PROLENSA) 0.07 % SOLN Place 1 drop   into the left eye daily.    . dextromethorphan-guaiFENesin (MUCINEX DM) 30-600 MG per 12 hr tablet Take 1 tablet by mouth 2 (two) times daily as needed for cough.    . Difluprednate (DUREZOL) 0.05 % EMUL Place 1 drop into the left eye 4 (four) times daily.    . digoxin (LANOXIN) 0.125 MG tablet Take 1 tablet (0.125 mg total) by mouth daily. 30 tablet 3  . docusate sodium (COLACE) 100 MG capsule Take 1  capsule (100 mg total) by mouth 2 (two) times daily. 10 capsule 0  . Fluticasone Furoate-Vilanterol (BREO ELLIPTA) 100-25 MCG/INH AEPB Inhale 1 puff into the lungs daily. 60 each 3  . folic acid (FOLVITE) 1 MG tablet Take 1 tablet (1 mg total) by mouth daily. 30 tablet 4  . furosemide (LASIX) 40 MG tablet Take 1 tablet (40 mg total) by mouth daily. May give extra dose 2-3 days  a week for edema. 60 tablet 0  . HYDROcodone-homatropine (HYCODAN) 5-1.5 MG/5ML syrup Take 5 mLs by mouth every 6 (six) hours as needed for cough. 240 mL 0  . ipratropium (ATROVENT) 0.02 % nebulizer solution Take 2.5 mLs (0.5 mg total) by nebulization 4 (four) times daily. 75 mL 12  . levalbuterol (XOPENEX HFA) 45 MCG/ACT inhaler Inhale 2 puffs into the lungs every 4 (four) hours as needed for wheezing. (Patient taking differently: Inhale 2 puffs into the lungs 4 (four) times daily. ) 1 Inhaler 11  . levalbuterol (XOPENEX) 0.63 MG/3ML nebulizer solution Take 3 mLs (0.63 mg total) by nebulization 4 (four) times daily. 3 mL 12  . losartan (COZAAR) 25 MG tablet Take 0.5 tablets (12.5 mg total) by mouth daily. 15 tablet 3  . metoprolol succinate (TOPROL-XL) 25 MG 24 hr tablet Take 25 mg by mouth daily.  5  . oxyCODONE-acetaminophen (PERCOCET/ROXICET) 5-325 MG per tablet Take 1 tablet by mouth every 4 (four) hours as needed for moderate pain or severe pain.   0  . OXYGEN Inhale 4 L into the lungs continuous.     . polyethylene glycol (MIRALAX / GLYCOLAX) packet Take 17 g by mouth daily as needed for mild constipation or moderate constipation. 14 each 0  . potassium chloride SA (K-DUR,KLOR-CON) 20 MEQ tablet TAKE 1 TABLET BY MOUTH EVERY DAY 30 tablet 0  . predniSONE (DELTASONE) 10 MG tablet Take 10 mg by mouth daily.  3  . SENOKOT S 8.6-50 MG per tablet Take 1 tablet by mouth at bedtime.   0  . spironolactone (ALDACTONE) 25 MG tablet Take 0.5 tablets (12.5 mg total) by mouth daily. 15 tablet 3  . sucralfate (CARAFATE) 1 G tablet  Take 1 tablet (1 g total) by mouth 4 (four) times daily -  with meals and at bedtime. Dissolve in 10 ml of water prior to taking 120 tablet 0  . traZODone (DESYREL) 50 MG tablet Take 50 mg by mouth at bedtime.      No current facility-administered medications for this visit.    SURGICAL HISTORY:  Past Surgical History  Procedure Laterality Date  . Total abdominal hysterectomy    . Shoulder arthroscopy    . Video bronchoscopy Bilateral 07/25/2012    Procedure: VIDEO BRONCHOSCOPY WITH FLUORO;  Surgeon: Michael B Wert, MD;  Location: [facility];  Service: Endoscopy;  Laterality: Bilateral;  . Lump removed from breasr right  2003  . Endobronchial ultrasound Bilateral 09/04/2012    Procedure: ENDOBRONCHIAL ULTRASOUND;  Surgeon: Robert S Byrum, MD;  Location: [facility];    Service: Cardiopulmonary;  Laterality: Bilateral;  . Breast surgery    . Subxyphoid pericardial window N/A 01/04/2014    Procedure: SUBXYPHOID PERICARDIAL WINDOW;  Surgeon: [name], MD;  Location: [facility];  Service: Thoracic;  Laterality: N/A;  . Intraoperative transesophageal echocardiogram N/A 01/04/2014    Procedure: INTRAOPERATIVE TRANSESOPHAGEAL ECHOCARDIOGRAM;  Surgeon: [name], MD;  Location: [place];  Service: Open Heart Surgery;  Laterality: N/A;    REVIEW OF SYSTEMS:  Constitutional: positive for fatigue Eyes: negative Ears, nose, mouth, throat, and face: negative Respiratory: positive for cough, dyspnea on exertion and wheezing Cardiovascular: negative Gastrointestinal: negative Genitourinary:negative Integument/breast: negative Hematologic/lymphatic: negative Musculoskeletal:negative Neurological: negative Behavioral/Psych: negative Endocrine: negative Allergic/Immunologic: negative   PHYSICAL EXAMINATION: General appearance: alert, cooperative, fatigued and no distress Head: Normocephalic, without obvious abnormality, atraumatic Neck: no adenopathy, no JVD, supple, symmetrical,  trachea midline and thyroid not enlarged, symmetric, no tenderness/mass/nodules Lymph nodes: Cervical, supraclavicular, and axillary nodes normal. Resp: clear to auscultation bilaterally and normal percussion bilaterally Back: symmetric, no curvature. ROM normal. No CVA tenderness. Cardio: regular rate and rhythm, S1, S2 normal, no murmur, click, rub or gallop and normal apical impulse GI: soft, non-tender; bowel sounds normal; no masses,  no organomegaly Extremities: extremities normal, atraumatic, no cyanosis or edema Neurologic: Alert and oriented X 3, normal strength and tone. Normal symmetric reflexes. Normal coordination and gait  ECOG PERFORMANCE STATUS: 2 - Symptomatic, <50% confined to bed  Blood pressure 100/61, pulse 97, temperature 97.8 F (36.6 C), temperature source Oral, resp. rate 16, height 5' 3" (1.6 m), weight 125 lb 12.8 oz (57.063 kg), SpO2 93 %.  LABORATORY DATA: Lab Results  Component Value Date   WBC 18.3* 01/09/2015   HGB 13.1 01/09/2015   HCT 41.9 01/09/2015   MCV 98.6 01/09/2015   PLT 387 01/09/2015      Chemistry      Component Value Date/Time   NA 138 01/09/2015 1007   NA 136 01/08/2015 1214   K 4.0 01/09/2015 1007   K 3.9 01/08/2015 1214   CL 82* 01/08/2015 1214   CL 104 11/13/2012 1122   CO2 >45 Result Confirmed by Automated Dilution.* 01/09/2015 1007   CO2 46* 01/08/2015 1214   BUN 18.7 01/09/2015 1007   BUN 21* 01/08/2015 1214   CREATININE 0.9 01/09/2015 1007   CREATININE 1.06* 01/08/2015 1214      Component Value Date/Time   CALCIUM 10.2 01/09/2015 1007   CALCIUM 9.7 01/08/2015 1214   ALKPHOS 80 01/09/2015 1007   ALKPHOS 76 01/08/2015 1214   AST 16 01/09/2015 1007   AST 23 01/08/2015 1214   ALT 16 01/09/2015 1007   ALT 14 01/08/2015 1214   BILITOT 0.31 01/09/2015 1007   BILITOT 0.7 01/08/2015 1214       RADIOGRAPHIC STUDIES: Dg Chest 1 View  12/25/2014   CLINICAL DATA:  Post thoracentesis.  EXAM: CHEST  1 VIEW  COMPARISON:   Chest x-ray from earlier same day.  FINDINGS: Interval resolution of the right pleural effusion, at the right lung base, status post thoracentesis. Opacity at the right lung apex is unchanged and, by a previous chest x-ray report, consistent with a known malignancy. Left lung appears stable with hyper expansion and probable basilar scarring/fibrosis.  Cardiomediastinal silhouette is stable in size and configuration. No acute osseous abnormality identified. No pneumothorax seen.  IMPRESSION: Interval resolution of the pleural effusion at the right lung base status post thoracentesis. No procedural complicating feature identified.   Electronically Signed  By: Stan  Maynard M.D.   On: 12/25/2014 15:12   Dg Chest 1 View  12/18/2014   CLINICAL DATA:  History of lung cancer, now with recurrent symptomatic right-sided pleural effusion. Post right-sided thoracentesis.  EXAM: CHEST  1 VIEW  COMPARISON:  12/18/2014; 12/07/2010; 12/06/2014; ultrasound-guided right-sided thoracentesis - 12/07/2014  FINDINGS: Grossly unchanged cardiac silhouette and mediastinal contours. Significant reduction / near resolution of right-sided pleural effusion post thoracentesis. No pneumothorax. Marked improved aeration of the right lung with grossly unchanged consolidative mass within the medial aspect the right lung apex compatible with known malignancy. No new focal airspace opacities. The lungs remain hyperexpanded. No evidence of edema. Unchanged bones.  IMPRESSION: 1. Significant reduction/near resolution of right-sided pleural effusion post thoracentesis. No pneumothorax. 2. Marked improved aeration of the right lung with grossly unchanged consolidative mass within medial aspect of the right lung apex compatible with known malignancy.   Electronically Signed   By: John  Watts M.D.   On: 12/18/2014 16:53   Dg Chest 2 View  01/08/2015   CLINICAL DATA:  Insertion of right-sided pleural drainage catheter. History of COPD and pneumonia  and malignant pleural effusion.  EXAM: CHEST  2 VIEW  COMPARISON:  01/03/2015  FINDINGS: No pleural drainage catheter is evident.  No pneumothorax.  There is volume loss on the righ, as well as masslike opacity sent over the right hilum, coarse reticular patchy airspace opacity in the right mid to upper lung and pleural thickening over the right apex. This is stable.  On the lateral view there is evidence of the loculated posterior right pleural effusion.  Left lung is hyperexpanded with changes of emphysema, but no evidence of pneumonia or edema. No left pleural effusion or pneumothorax.  IMPRESSION: 1. No significant change from the recent prior exam. 2. Appearance of the right lung and hemi thorax is stable from the most recent prior study. 3. No pleural drainage catheter is seen.  No pneumothorax.   Electronically Signed   By: David  Ormond M.D.   On: 01/08/2015 12:51   Dg Chest 2 View  01/02/2015   CLINICAL DATA:  History of lung carcinoma, short of breath over the last several days, history of recurring pleural effusions  EXAM: CHEST  2 VIEW  COMPARISON:  Chest x-ray of 12/25/2014 and CT chest of 11/26/2014  FINDINGS: There is now more opacity at the right lung base most consistent with recurrent right pleural effusion and postoperative change with volume loss. Poor aeration of the right upper lung field again is noted. The left lung is clear and somewhat hyperaerated. Heart size is stable. No bony abnormality is seen.  IMPRESSION: More opacity at the right lung base most consistent with increasing right pleural effusion.   Electronically Signed   By: Paul  Barry M.D.   On: 01/02/2015 14:25   Dg Chest 2 View  12/25/2014   CLINICAL DATA:  59-year-old female with shortness of breath. Non-small cell lung cancer. Subsequent encounter.  EXAM: CHEST  2 VIEW  COMPARISON:  12/18/2014 and earlier  FINDINGS: Interval reaccumulation of the loculated right pleural effusion, but less severe than on 12/18/2014. Mild  residual right lung ventilation. Stable visualized mediastinal contours. The left lung appears stable. No pneumothorax. Stable visualized osseous structures.  IMPRESSION: Re-accumulation of the loculated right pleural effusion, but less severe than that seen prior to the 12/18/2014 thoracentesis.  Otherwise stable chest.   Electronically Signed   By: H  Hall M.D.   On: 12/25/2014 10:42     Dg Chest 2 View  12/18/2014   CLINICAL DATA:  59 year old female with increasing shortness of breath status post thoracentesis  EXAM: CHEST  2 VIEW  COMPARISON:  Prior chest x-ray 12/07/2014 and 12/06/2014; prior chest CT 11/26/2014  FINDINGS: Interval reaccumulation of pleural fluid which is now loculated in appearance posteriorly, laterally and inferiorly. There is near whiteout of the right chest. The left lung remains hyperinflated with a background bronchitic changes, emphysema and areas of pleural parenchymal scarring. Persistent volume loss in the right hemi thorax with left-to-right shift of the cardiac and mediastinal structures. No acute osseous abnormality.  IMPRESSION: 1. Interval reaccumulation of large right-sided pleural effusion which is now loculated in appearance. 2. Unchanged severe emphysema and hyperinflation involving the left lung.   Electronically Signed   By: Jacqulynn Cadet M.D.   On: 12/18/2014 13:03   [place] Chest  01/02/2015   CLINICAL DATA:  Right pleural effusion.  EXAM: CHEST ULTRASOUND  COMPARISON:  Chest x-ray dated 01/02/2015  FINDINGS: There is a large pocket of pleural fluid seen posteriorly in the right hemi thorax. However, because of hypotension, thoracentesis was not performed.  IMPRESSION: There is a large pocket of pleural fluid in the right hemi thorax. Thoracentesis not performed because of hypotension.   Electronically Signed   By: Lorriane Shire M.D.   On: 01/02/2015 17:04   Dg Chest Port 1 View  01/03/2015   CLINICAL DATA:  Pleural effusion, cough, lung cancer, emphysema  EXAM:  PORTABLE CHEST - 1 VIEW  COMPARISON:  12/25/2014, CT chest 11/26/2014, chest x-ray 12/18/2014  FINDINGS: Persistent right upper lobe airspace opacity consistent with known malignancy. There is patchy right upper lobe airspace disease most concerning for postobstructive pneumonitis. There is a small right pleural effusion. There is no left pleural effusion, pneumothorax or focal consolidation. There is right lung volume loss. Stable cardiomediastinal silhouette. No acute osseous abnormality.  IMPRESSION: 1. Persistent right upper lobe airspace opacity consistent with known malignancy. 2. Patchy right upper lobe airspace disease concerning for postobstructive pneumonitis. 3. Small right pleural effusion.   Electronically Signed   By: Kathreen Devoid   On: 01/03/2015 11:15   Dg C-arm 1-60 Min-no Report  01/08/2015   CLINICAL DATA: surgery   C-ARM 1-60 MINUTES  Fluoroscopy was utilized by the requesting physician.  No radiographic  interpretation.    US Thoracentesis Asp Pleural Space W/img Guide  12/25/2014   CLINICAL DATA:  Recurrent pleural effusion due to lung cancer  EXAM: ULTRASOUND GUIDED RIGHT THORACENTESIS  COMPARISON:  None.  PROCEDURE: An ultrasound guided thoracentesis was thoroughly discussed with the patient and questions answered. The benefits, risks, alternatives and complications were also discussed. The patient understands and wishes to proceed with the procedure. Written consent was obtained.  Ultrasound was performed to localize and mark an adequate pocket of fluid in the right chest. The area was then prepped and draped in the normal sterile fashion. 1% Lidocaine was used for local anesthesia. Under ultrasound guidance a Safe T Centesis catheter was introduced. Thoracentesis was performed. The catheter was removed and a dressing applied.  COMPLICATIONS: None immediate.  FINDINGS: A total of approximately 900 mls of dark yellow fluid was removed. A fluid sample was notsent for laboratory analysis.   IMPRESSION: Successful ultrasound guided right thoracentesis yielding 900 mls of pleural fluid.  Read by:  Gareth Eagle, PA-C   Electronically Signed   By: Sandi Mariscal M.D.   On: 12/25/2014 14:55   US Thoracentesis Asp Pleural Space  W/img Guide  12/18/2014   INDICATION: Right lung cancer, dyspnea, recurrent right pleural effusion. Request is made for diagnostic and therapeutic right thoracentesis.  EXAM: ULTRASOUND GUIDED DIAGNOSTIC AND THERAPEUTIC RIGHT THORACENTESIS  COMPARISON:  Prior thoracentesis on 12/07/2014  MEDICATIONS: None  COMPLICATIONS: None immediate  TECHNIQUE: Informed written consent was obtained from the patient after a discussion of the risks, benefits and alternatives to treatment. A timeout was performed prior to the initiation of the procedure.  Initial ultrasound scanning demonstrates a moderate to large right pleural effusion. The lower chest was prepped and draped in the usual sterile fashion. 1% lidocaine was used for local anesthesia.  An ultrasound image was saved for documentation purposes. A 6 Fr Safe-T-Centesis catheter was introduced. The thoracentesis was performed. The catheter was removed and a dressing was applied. The patient tolerated the procedure well without immediate post procedural complication. The patient was escorted to have an upright chest radiograph.  FINDINGS: A total of approximately 1.5 liters of hazy, amber fluid was removed. Requested samples were sent to the laboratory.  IMPRESSION: Successful ultrasound-guided diagnostic and therapeutic right sided thoracentesis yielding 1.5 liters of pleural fluid.  Read by: Kevin Allred, PA-C   Electronically Signed   By: John  Watts M.D.   On: 12/18/2014 16:29    ASSESSMENT AND PLAN: This is a very pleasant 59 years old white female with stage IIIA non-small cell lung cancer, adenocarcinoma with negative EGFR mutation and negative ALK gene translocation is status post concurrent chemoradiation with weekly carboplatin  and paclitaxel followed by consolidation chemotherapy with 3 cycles of carboplatin and gemcitabine. The patient was found to have evidence for disease progression and was started on systemic chemotherapy with one cycle of carboplatin and Alimta. This was complicated with pancytopenia. She is currently undergoing treatment with immunotherapy with Nivolumab 3 MG/KG every 2 weeks is status post 3 cycles and tolerating her treatment fairly well. We will proceed with cycle #4 today as a scheduled. She will come back for follow-up visit in 2 weeks for evaluation after repeating CT scan of the chest for restaging of her disease. For the recurrent right-sided pleural effusion, the patient had a right Pleurx catheter placed yesterday. The patient was advised to call immediately if she has any concerning symptoms in the interval.  The patient voices understanding of current disease status and treatment options and is in agreement with the current care plan.  All questions were answered. The patient knows to call the clinic with any problems, questions or concerns. We can certainly see the patient much sooner if necessary.  Disclaimer: This note was dictated with voice recognition software. Similar sounding words can inadvertently be transcribed and may not be corrected upon review.       

## 2015-01-09 NOTE — Telephone Encounter (Signed)
Per staff message and POF I have scheduled appts. Advised scheduler of appts. JMW  

## 2015-01-09 NOTE — Telephone Encounter (Signed)
Pt states she wants to have port placed. Needs to be done before 8/19.

## 2015-01-09 NOTE — Op Note (Signed)
NAMENADRA, HRITZ                ACCOUNT NO.:  [id number]  MEDICAL RECORD NO.:  [id number]  LOCATION:  [facility]                         FACILITY:  [facility]  PHYSICIAN:  Revonda Standard. Roxan Hockey, M.D.DATE OF BIRTH:  1956/05/30  DATE OF PROCEDURE:  01/08/2015 DATE OF DISCHARGE:  01/08/2015                              OPERATIVE REPORT   PREOPERATIVE DIAGNOSIS:  Malignant right pleural effusion.  POSTOPERATIVE DIAGNOSIS:  Malignant right pleural effusion.  PROCEDURE:  Right pleural catheter placement.  SURGEON:  Revonda Standard. Roxan Hockey, M.D.  ASSISTANT:  None.  ANESTHESIA:  Local with intravenous sedation.  FINDINGS:  500 mL of serous fluid evacuated.  Good position of the catheter in the pleural space.  CLINICAL NOTE:  Mrs. Esquilin is a 59 year old woman with a history of metastatic lung cancer, who has a rapidly recurring right pleural effusion.  This effusion is malignant.  She was advised to undergo PleurX catheter placement after requiring four thoracenteses in just over a month.  The indications, risks, benefits, alternatives, home care and use of the catheter were discussed in detail with the patient.  She understood and accepted the risks and agreed to proceed.  OPERATIVE NOTE:  Mrs. Supan was brought to the operating room on January 08, 2015.  She was given intravenous sedation and monitored by the Anesthesia Service.  The right chest was prepped and draped in the usual sterile fashion.  After performing a time-out, the entry and exit sites for the PleurX catheter were anesthetized with 1% lidocaine and the tract and between them was anesthetized as well.  A total of 10 mL of 1% lidocaine was used.  An incision was made at the entry site.  A needle was used to access the right pleural effusion.  A wire was advanced into the right pleural space.  Position was confirmed by fluoroscopy. There was a smaller loculated fluid collection in the lateral right chest that was not accessed  with the catheter, the larger basilar fluid collection was.  The catheter then was tunneled from the exit site to the entry site with the cuff just under the skin at the exit site.  The tract over the wire was dilated.  The peel-away sheath introducer was advanced into the chest.  The inner cannula and wire were removed.  The catheter was advanced through the peel-away sheath introducer and connected to suction.  500 mL of clear serous fluid was evacuated.  Fluoroscopy showed improvement of the pleural effusion post drainage.  The entry site incision was closed with 3-0 Vicryl subcuticular suture.  The catheter was secured at the exit site with a 3- 0 nylon suture.  The catheter was capped and the dressing was placed. The patient was taken from the operating room to the postanesthetic care unit in good condition.     Revonda Standard Roxan Hockey, M.D.     SCH/MEDQ  D:  01/08/2015  T:  01/09/2015  Job:  [id number]

## 2015-01-09 NOTE — Progress Notes (Addendum)
EDCM spoke to [facility] transitional care specialist at [facility] who reports referral was received and patient is to be seen by [facility] on 08/05.  No further EDCM needs at this time.  01/09/2015 A. Marasia Newhall RNCM 1626pm EDCM called [facility] at [phone] to check if referral for pleurx supplies has been received.  EDCM spoke to [place] at [facility] who reports referral has not been received but sometimes takes up to 24 hours for referrals to be processed.   01/09/2015 A.Elara Cocke RNCM  EDCM spoke to [place] [facility] who confirms [facility] forms for pleurix supplies were faxed and confirmation of receipt was received.

## 2015-01-09 NOTE — Patient Instructions (Signed)
Cancer Center Discharge Instructions for Patients Receiving Chemotherapy  Today you received the following chemotherapy agents Nivolumab.  To help prevent nausea and vomiting after your treatment, we encourage you to take your nausea medication as prescribed.   If you develop nausea and vomiting that is not controlled by your nausea medication, call the clinic.   BELOW ARE SYMPTOMS THAT SHOULD BE REPORTED IMMEDIATELY:  *FEVER GREATER THAN 100.5 F  *CHILLS WITH OR WITHOUT FEVER  NAUSEA AND VOMITING THAT IS NOT CONTROLLED WITH YOUR NAUSEA MEDICATION  *UNUSUAL SHORTNESS OF BREATH  *UNUSUAL BRUISING OR BLEEDING  TENDERNESS IN MOUTH AND THROAT WITH OR WITHOUT PRESENCE OF ULCERS  *URINARY PROBLEMS  *BOWEL PROBLEMS  UNUSUAL RASH Items with * indicate a potential emergency and should be followed up as soon as possible.  Feel free to call the clinic you have any questions or concerns. The clinic phone number is [phone].  Please show the CHEMO ALERT CARD at check-in to the Emergency Department and triage nurse.   

## 2015-01-09 NOTE — Telephone Encounter (Signed)
Ok will order

## 2015-01-10 DIAGNOSIS — C3411 Malignant neoplasm of upper lobe, right bronchus or lung: Secondary | ICD-10-CM | POA: Diagnosis not present

## 2015-01-11 ENCOUNTER — Encounter: Payer: Self-pay | Admitting: Internal Medicine

## 2015-01-13 ENCOUNTER — Telehealth: Payer: Self-pay | Admitting: Internal Medicine

## 2015-01-13 ENCOUNTER — Other Ambulatory Visit: Payer: Self-pay | Admitting: Medical Oncology

## 2015-01-13 NOTE — Telephone Encounter (Signed)
s.w. pt husband and advised on appt moved to Fridays...Marland Kitchenhe is ok and aware

## 2015-01-15 ENCOUNTER — Ambulatory Visit (HOSPITAL_COMMUNITY)
Admission: RE | Admit: 2015-01-15 | Discharge: 2015-01-15 | Disposition: A | Discharge status: Home / Self Care | Payer: BLUE CROSS/BLUE SHIELD | Source: Ambulatory Visit | Attending: Internal Medicine | Admitting: Internal Medicine

## 2015-01-15 ENCOUNTER — Other Ambulatory Visit: Payer: Self-pay | Admitting: Radiology

## 2015-01-15 VITALS — BP 102/58 | HR 97 | Wt 122.1 lb

## 2015-01-15 DIAGNOSIS — J449 Chronic obstructive pulmonary disease, unspecified: Secondary | ICD-10-CM | POA: Insufficient documentation

## 2015-01-15 DIAGNOSIS — R609 Edema, unspecified: Secondary | ICD-10-CM | POA: Diagnosis not present

## 2015-01-15 DIAGNOSIS — C3490 Malignant neoplasm of unspecified part of unspecified bronchus or lung: Secondary | ICD-10-CM | POA: Diagnosis not present

## 2015-01-15 DIAGNOSIS — I5189 Other ill-defined heart diseases: Secondary | ICD-10-CM

## 2015-01-15 DIAGNOSIS — J9610 Chronic respiratory failure, unspecified whether with hypoxia or hypercapnia: Secondary | ICD-10-CM | POA: Diagnosis not present

## 2015-01-15 DIAGNOSIS — I5022 Chronic systolic (congestive) heart failure: Secondary | ICD-10-CM | POA: Diagnosis not present

## 2015-01-15 DIAGNOSIS — I5021 Acute systolic (congestive) heart failure: Secondary | ICD-10-CM | POA: Diagnosis not present

## 2015-01-15 DIAGNOSIS — I519 Heart disease, unspecified: Secondary | ICD-10-CM

## 2015-01-15 DIAGNOSIS — Z87891 Personal history of nicotine dependence: Secondary | ICD-10-CM | POA: Diagnosis not present

## 2015-01-15 MED ORDER — FUROSEMIDE 40 MG PO TABS: 40.0000 mg | ORAL_TABLET | ORAL | Status: AC | PRN

## 2015-01-15 NOTE — Progress Notes (Signed)
Advanced Heart Failure Medication Review by a Pharmacist  Does the patient  feel that his/her medications are working for him/her?  yes  Has the patient been experiencing any side effects to the medications prescribed?  Yes - feels fatigued  Does the patient measure his/her own blood pressure or blood glucose at home?  yes   Does the patient have any problems obtaining medications due to transportation or finances?   no  Understanding of regimen: good Understanding of indications: good Potential of compliance: good    Pharmacist comments:  Pleasant 59 yo F presents with her husband without a medication list. She reports good compliance with her medications and stated that she missed one day of her medications last Thursday (8/4) for a procedure. She denies needing to use any additional furosemide in the past several weeks. She did bring up concerns about her blood pressure being low and feeling particularly fatigued lately and asked if her metoprolol dose could be reduced. She is also on losartan and spironolactone which could also be contributing to her low blood pressure but both of these are at low doses. Will discuss with Dr. Haroldine Laws the possibility of reducing the dose of her metoprolol. No other medication concerns identified at this visit.   Ruta Hinds. Velva Harman, PharmD, [place] Clinical Pharmacist Pager: [phone] Phone: [phone] 01/15/2015 11:50 AM

## 2015-01-15 NOTE — Progress Notes (Signed)
Patient ID: Linda Donovan, female   DOB: 1955/12/14, 59 y.o.   MRN: [id number]    SUBJECTIVE:   Linda Donovan 60 yo female with h/o recurrent lung cancer, severe COPD, prior pericardial window and systolic HF. Has been undergoing radiation therapy and chemotherapy for recurrent lung cancer. Admitted in 4/16 for respiratory failure. CXR suggestive of RLL pneumonia/pneumonitis and RUL with invasive carcinoma.   EF by MRI was 26% and RVF EF 36%. Mild to moderate TR with mild Pulmonary HTN also noted. 09/13/2014  ECHO EF 25-30% Mild RV HK. RVSP 60 (normal echo in 8/15) 11/26/14 ECHO EF 65-70%, Grade 1 DD, otherwise poor visualization despite definity.   Returns to clinic for f/u. Getting nevulimab every 2 weeks. She feels ok Has not had to take extra lasix recently.  Taking 40 mg lasix every morning. Weight has been declining recently. Mild pedal edema - worse in am. Still SOB with minimal activity. Some dizziness with standing. No orthopnea. Very seldomly wakes up with PND. BP on low end, has been consistently under 100 SBP for since beginning of August.  Has been limiting to fluid intake to a quart per day.  Labs:  11/20/14: K 3.6 Creatinine 0.6 01/09/15: K 4.0, Creatinine 0.9   Past Medical History  Diagnosis Date  . COPD (chronic obstructive pulmonary disease)   . ADD (attention deficit disorder)   . Hx of radiation therapy 09/28/12- 11/17/12    RU lung mass, mediastinum chest, 63 gray 35 fx  . Pneumonia   . Arthritis   . Non-small cell lung cancer 09/18/2012    RUL  . Asthma   . Malignant pleural effusion    Current Outpatient Prescriptions on File Prior to Encounter  Medication Sig Dispense Refill  . ALPRAZolam (XANAX) 1 MG tablet Take 1 tablet (1 mg total) by mouth 3 (three) times daily. 90 tablet 0  . amitriptyline (ELAVIL) 50 MG tablet Take 1 tablet (50 mg total) by mouth daily. 30 tablet 1  . aspirin 81 MG tablet Take 81 mg by mouth at bedtime.     . benzonatate (TESSALON) 200 MG  capsule Take 200 mg by mouth 3 (three) times daily.   2  . dextromethorphan-guaiFENesin (MUCINEX DM) 30-600 MG per 12 hr tablet Take 1 tablet by mouth 2 (two) times daily as needed for cough.    . Difluprednate (DUREZOL) 0.05 % EMUL Place 1 drop into the left eye 4 (four) times daily.    . digoxin (LANOXIN) 0.125 MG tablet Take 1 tablet (0.125 mg total) by mouth daily. 30 tablet 3  . docusate sodium (COLACE) 100 MG capsule Take 1 capsule (100 mg total) by mouth 2 (two) times daily. 10 capsule 0  . Fluticasone Furoate-Vilanterol (BREO ELLIPTA) 100-25 MCG/INH AEPB Inhale 1 puff into the lungs daily. 60 each 3  . folic acid (FOLVITE) 1 MG tablet Take 1 tablet (1 mg total) by mouth daily. 30 tablet 4  . furosemide (LASIX) 40 MG tablet Take 1 tablet (40 mg total) by mouth daily. May give extra dose 2-3 days  a week for edema. 60 tablet 0  . HYDROcodone-homatropine (HYCODAN) 5-1.5 MG/5ML syrup Take 5 mLs by mouth every 6 (six) hours as needed for cough. 240 mL 0  . ipratropium (ATROVENT) 0.02 % nebulizer solution Take 2.5 mLs (0.5 mg total) by nebulization 4 (four) times daily. 75 mL 12  . levalbuterol (XOPENEX HFA) 45 MCG/ACT inhaler Inhale 2 puffs into the lungs every 4 (four) hours as needed  for wheezing. (Patient taking differently: Inhale 2 puffs into the lungs every 6 (six) hours as needed for wheezing or shortness of breath. ) 1 Inhaler 11  . levalbuterol (XOPENEX) 0.63 MG/3ML nebulizer solution Take 3 mLs (0.63 mg total) by nebulization 4 (four) times daily. 3 mL 12  . losartan (COZAAR) 25 MG tablet Take 0.5 tablets (12.5 mg total) by mouth daily. 15 tablet 3  . metoprolol succinate (TOPROL-XL) 25 MG 24 hr tablet Take 25 mg by mouth daily.  5  . oxyCODONE-acetaminophen (PERCOCET/ROXICET) 5-325 MG per tablet Take 1 tablet by mouth every 4 (four) hours as needed for moderate pain or severe pain.   0  . OXYGEN Inhale 4 L into the lungs continuous.     . polyethylene glycol (MIRALAX / GLYCOLAX) packet  Take 17 g by mouth daily as needed for mild constipation or moderate constipation. 14 each 0  . potassium chloride SA (K-DUR,KLOR-CON) 20 MEQ tablet TAKE 1 TABLET BY MOUTH EVERY DAY 30 tablet 0  . predniSONE (DELTASONE) 10 MG tablet Take 10 mg by mouth daily.  3  . spironolactone (ALDACTONE) 25 MG tablet Take 0.5 tablets (12.5 mg total) by mouth daily. 15 tablet 3  . sucralfate (CARAFATE) 1 G tablet Take 1 tablet (1 g total) by mouth 4 (four) times daily -  with meals and at bedtime. Dissolve in 10 ml of water prior to taking 120 tablet 0  . traZODone (DESYREL) 50 MG tablet Take 50 mg by mouth at bedtime.      No current facility-administered medications on file prior to encounter.     OBJECTIVE:   Vitals:   Filed Vitals:   01/15/15 1133  BP: 102/58  Pulse: 97  Weight: 122 lb 1.9 oz (55.393 kg)  SpO2: 94%     PHYSICAL EXAM General:  Sitting in WC Chronically ill appearing. NAD Head: Eyes PERRLA, No xanthomas.   Normal cephalic and atramatic  Neck: CVP 7 Lungs:  Coarse BS. No wheeze. Dull on right Heart:   Distant. Regularr. No obvious s3  Abdomen: Bowel sounds are positive, abdomen soft and non-tender without masses  Extremities:   No clubbing, cyanosis or edema. Neuro: Alert and oriented X 3. Psych:  Good affect, responds appropriately   ASSESSMENT AND PLAN 1. Chronic Systolic HF-  Left ventricular systolic dysfunction, new since August 2015 . possibly secondary to chemotherapy>  EF by MRI was 26% and RVF EF 36%.   Mild to moderate TR with mild Pulmonary HTN also noted.  -- EF greatly improved on recent Echo.  65-70%, Grade 1 DD on 11/26/14 -- She is much improved from HF perspective. Volume status looks good.  -- Reinforced need for daily weights and reviewed use of sliding scale diuretics. 2. Chronic respiratory failure 3. Advanced COPD  4. Recurrent non-small cell lung cancer 5. Radiation pnuemonitis   Linda Friar, PA-C 11:42 AM   Patient seen and  examined with Linda Kilts, PA-C. We discussed all aspects of the encounter. I agree with the assessment and plan as stated above.   EF now normal. Has mild pedal edema but I think this is venous insufficiency. No volume overload anywhere else. Will stop digoxin and spiro. Cut lasix to 40 mg qod. Can take extra lasix as needed for swelling. Consider compression hose for feet swelling.   Bensimhon, Daniel,MD 12:22 PM

## 2015-01-15 NOTE — Patient Instructions (Signed)
STOP Spironolactone.  STOP Digoxin.  TAKE Lasix 40 mg as needed for edema.  FOLLOW UP in 2 months.

## 2015-01-15 NOTE — Addendum Note (Signed)
Encounter addended by: Harvie Junior, CMA on: 01/15/2015 12:28 PM<BR>     Documentation filed: Medications, Dx Association, Patient Instructions Section, Orders

## 2015-01-16 ENCOUNTER — Encounter (HOSPITAL_COMMUNITY): Payer: Self-pay

## 2015-01-16 ENCOUNTER — Ambulatory Visit (HOSPITAL_COMMUNITY)
Admission: RE | Admit: 2015-01-16 | Discharge: 2015-01-16 | Disposition: A | Discharge status: Home / Self Care | Payer: BLUE CROSS/BLUE SHIELD | Source: Ambulatory Visit | Attending: Internal Medicine | Admitting: Internal Medicine

## 2015-01-16 ENCOUNTER — Other Ambulatory Visit: Payer: Self-pay | Admitting: Internal Medicine

## 2015-01-16 DIAGNOSIS — I5022 Chronic systolic (congestive) heart failure: Secondary | ICD-10-CM | POA: Diagnosis not present

## 2015-01-16 DIAGNOSIS — C3491 Malignant neoplasm of unspecified part of right bronchus or lung: Secondary | ICD-10-CM

## 2015-01-16 LAB — BASIC METABOLIC PANEL
ANION GAP: 10 (ref 5–15)
BUN: 8 mg/dL (ref 6–20)
CO2: 41 mmol/L — ABNORMAL HIGH (ref 22–32)
Calcium: 9.4 mg/dL (ref 8.9–10.3)
Chloride: 84 mmol/L — ABNORMAL LOW (ref 101–111)
Creatinine, Ser: 0.6 mg/dL (ref 0.44–1.00)
Glucose, Bld: 142 mg/dL — ABNORMAL HIGH (ref 65–99)
POTASSIUM: 3.1 mmol/L — AB (ref 3.5–5.1)
SODIUM: 135 mmol/L (ref 135–145)

## 2015-01-16 LAB — CBC
HCT: 40.8 % (ref 36.0–46.0)
HEMOGLOBIN: 12.7 g/dL (ref 12.0–15.0)
MCH: 30.2 pg (ref 26.0–34.0)
MCHC: 31.1 g/dL (ref 30.0–36.0)
MCV: 97.1 fL (ref 78.0–100.0)
Platelets: 508 10*3/uL — ABNORMAL HIGH (ref 150–400)
RBC: 4.2 MIL/uL (ref 3.87–5.11)
RDW: 13.5 % (ref 11.5–15.5)
WBC: 18.3 10*3/uL — ABNORMAL HIGH (ref 4.0–10.5)

## 2015-01-16 LAB — APTT: aPTT: 31 seconds (ref 24–37)

## 2015-01-16 LAB — PROTIME-INR
INR: 0.96 (ref 0.00–1.49)
PROTHROMBIN TIME: 13 s (ref 11.6–15.2)

## 2015-01-16 MED ORDER — LIDOCAINE HCL 1 % IJ SOLN
INTRAMUSCULAR | Status: AC
  Filled 2015-01-16: qty 20

## 2015-01-16 MED ORDER — SODIUM CHLORIDE 0.9 % IV SOLN
Freq: Once | INTRAVENOUS | Status: AC
  Administered 2015-01-16: 500 mL via INTRAVENOUS

## 2015-01-16 MED ORDER — CEFAZOLIN SODIUM-DEXTROSE 2-3 GM-% IV SOLR
2.0000 g | Freq: Once | INTRAVENOUS | Status: AC
  Administered 2015-01-16: 2 g via INTRAVENOUS

## 2015-01-16 MED ORDER — HEPARIN SOD (PORK) LOCK FLUSH 100 UNIT/ML IV SOLN
INTRAVENOUS | Status: AC | PRN
  Administered 2015-01-16: 500 [IU]

## 2015-01-16 MED ORDER — CEFAZOLIN SODIUM-DEXTROSE 2-3 GM-% IV SOLR
INTRAVENOUS | Status: AC
  Filled 2015-01-16: qty 50

## 2015-01-16 MED ORDER — FENTANYL CITRATE (PF) 100 MCG/2ML IJ SOLN
INTRAMUSCULAR | Status: AC | PRN
  Administered 2015-01-16: 50 ug via INTRAVENOUS

## 2015-01-16 MED ORDER — HEPARIN SOD (PORK) LOCK FLUSH 100 UNIT/ML IV SOLN
INTRAVENOUS | Status: AC
  Filled 2015-01-16: qty 5

## 2015-01-16 MED ORDER — LIDOCAINE-EPINEPHRINE 2 %-1:100000 IJ SOLN
INTRAMUSCULAR | Status: DC
Start: 2015-01-16 — End: 2015-01-17
  Filled 2015-01-16: qty 1

## 2015-01-16 MED ORDER — MIDAZOLAM HCL 2 MG/2ML IJ SOLN
INTRAMUSCULAR | Status: AC | PRN
  Administered 2015-01-16 (×2): 0.5 mg via INTRAVENOUS
  Administered 2015-01-16 (×2): 1 mg via INTRAVENOUS

## 2015-01-16 MED ORDER — FENTANYL CITRATE (PF) 100 MCG/2ML IJ SOLN
INTRAMUSCULAR | Status: AC
  Filled 2015-01-16: qty 4

## 2015-01-16 MED ORDER — MIDAZOLAM HCL 2 MG/2ML IJ SOLN
INTRAMUSCULAR | Status: AC
  Filled 2015-01-16: qty 6

## 2015-01-16 NOTE — Discharge Instructions (Signed)

## 2015-01-16 NOTE — Progress Notes (Signed)
Patient ID: Linda Donovan, female   DOB: 02/16/1956, 59 y.o.   MRN: [id number]    Referring Physician(s): [name],[name]  Chief Complaint: Recurrent lung cancer   Subjective:  Pt familiar to IR service from multiple thoracenteses. She has history of recurrent non-small cell lung cancer, adenocarcinoma with negative EGFR mutation and negative ALK gene translocation , status post concurrent chemoradiation with weekly carboplatin and paclitaxel followed by consolidation chemotherapy with 4 cycles of carboplatin and gemcitabine.The patient was found to have evidence for disease progression and was started on systemic chemotherapy with one cycle of carboplatin and Alimta. This was complicated with pancytopenia.She is currently undergoing treatment with immunotherapy . She had right pleurx catheter placed 8/3 for recurrent malignant effusion. She presents today for port a cath placement. She cont to have occ cough and dyspnea but slightly improved since pleurx catheter. She denies recent fevers, chills, N/V, abd pain/back pain or abnormal bleeding.   Allergies: Clinoril and Morphine  Medications: Prior to Admission medications   Medication Sig Start Date End Date Taking? Authorizing Provider  ALPRAZolam (XANAX) 1 MG tablet Take 1 tablet (1 mg total) by mouth 3 (three) times daily. 09/21/14  Yes Nayana Abrol, MD  amitriptyline (ELAVIL) 50 MG tablet Take 1 tablet (50 mg total) by mouth daily. 12/30/14  Yes [name] [name], MD  aspirin 81 MG tablet Take 81 mg by mouth at bedtime.    Yes Historical Provider, MD  benzonatate (TESSALON) 200 MG capsule Take 200 mg by mouth 3 (three) times daily.  12/14/14  Yes Historical Provider, MD  dextromethorphan-guaiFENesin (MUCINEX DM) 30-600 MG per 12 hr tablet Take 1 tablet by mouth 2 (two) times daily as needed for cough.   Yes Historical Provider, MD  Difluprednate (DUREZOL) 0.05 % EMUL Place 1 drop into the left eye 4 (four) times daily.   Yes Historical  Provider, MD  docusate sodium (COLACE) 100 MG capsule Take 1 capsule (100 mg total) by mouth 2 (two) times daily. 09/21/14  Yes Nayana Abrol, MD  Fluticasone Furoate-Vilanterol (BREO ELLIPTA) 100-25 MCG/INH AEPB Inhale 1 puff into the lungs daily. 12/31/14  Yes Robert S Byrum, MD  folic acid (FOLVITE) 1 MG tablet Take 1 tablet (1 mg total) by mouth daily. 08/13/14  Yes [name] [name], MD  furosemide (LASIX) 40 MG tablet Take 1 tablet (40 mg total) by mouth as needed. As needed for edema 01/15/15  Yes Michael Andrew Tillery, PA-C  HYDROcodone-homatropine (HYCODAN) 5-1.5 MG/5ML syrup Take 5 mLs by mouth every 6 (six) hours as needed for cough. 01/01/15  Yes Adrena E Johnson, PA-C  ipratropium (ATROVENT) 0.02 % nebulizer solution Take 2.5 mLs (0.5 mg total) by nebulization 4 (four) times daily. 09/21/14  Yes Nayana Abrol, MD  levalbuterol (XOPENEX HFA) 45 MCG/ACT inhaler Inhale 2 puffs into the lungs every 4 (four) hours as needed for wheezing. Patient taking differently: Inhale 2 puffs into the lungs every 6 (six) hours as needed for wheezing or shortness of breath.  12/31/14  Yes Robert S Byrum, MD  levalbuterol (XOPENEX) 0.63 MG/3ML nebulizer solution Take 3 mLs (0.63 mg total) by nebulization 4 (four) times daily. 12/31/14  Yes Robert S Byrum, MD  losartan (COZAAR) 25 MG tablet Take 0.5 tablets (12.5 mg total) by mouth daily. 11/25/14  Yes Dalton S McLean, MD  metoprolol succinate (TOPROL-XL) 25 MG 24 hr tablet Take 25 mg by mouth daily. 08/16/14  Yes Historical Provider, MD  oxyCODONE-acetaminophen (PERCOCET/ROXICET) 5-325 MG per tablet Take 1 tablet by mouth every   4 (four) hours as needed for moderate pain or severe pain.  10/09/14  Yes Historical Provider, MD  OXYGEN Inhale 4 L into the lungs continuous.    Yes Historical Provider, MD  potassium chloride SA (K-DUR,KLOR-CON) 20 MEQ tablet TAKE 1 TABLET BY MOUTH EVERY DAY 01/06/15  Yes Jolaine Artist, MD  predniSONE (DELTASONE) 10 MG tablet Take 10 mg by  mouth daily. 11/05/14  Yes Historical Provider, MD  sucralfate (CARAFATE) 1 G tablet Take 1 tablet (1 g total) by mouth 4 (four) times daily -  with meals and at bedtime. Dissolve in 10 ml of water prior to taking 09/10/14  Yes Gery Pray, MD  traZODone (DESYREL) 50 MG tablet Take 50 mg by mouth at bedtime.  11/06/14  Yes Historical Provider, MD  polyethylene glycol (MIRALAX / GLYCOLAX) packet Take 17 g by mouth daily as needed for mild constipation or moderate constipation. 09/21/14   Reyne Dumas, MD     Vital Signs:BP 102/53  HR 101  R 20 TEMP 97.9  O2 SATS 92% 4 L/min Ht 5' 3" (1.6 m)  Wt 122 lb 1.9 oz (55.393 kg)  BMI 21.64 kg/m2  Physical Exam pt awake/alert; chest- distant BS bilat; intact rt pleurx cath with overlying gauze dressing; heart- sl tachy with some ectopy; abd- soft,+BS,NT; 1+ bilat LE edema  Imaging: No results found.  Labs:  CBC:  Recent Labs  01/03/15 0427 01/08/15 1214 01/09/15 1007 01/16/15 1245  WBC 20.2* 20.0* 18.3* 18.3*  HGB 12.6 12.7 13.1 12.7  HCT 40.7 40.3 41.9 40.8  PLT 457* 417* 387 508*    COAGS:  Recent Labs  01/03/15 0427 01/08/15 1214 01/16/15 1245  INR 1.03 0.93 0.96  APTT  --  31 31    BMP:  Recent Labs  01/02/15 1700 01/03/15 0427 01/08/15 1214 01/09/15 1007 01/16/15 1245  NA 133* 132* 136 138 135  K 4.5 3.9 3.9 4.0 3.1*  CL 79* 78* 82*  --  84*  CO2 45* 47* 46* >45 Result Confirmed by Automated Dilution.* 41*  GLUCOSE 148* 141* 132* 192* 142*  BUN 9 8 21* 18.7 8  CALCIUM 9.6 9.2 9.7 10.2 9.4  CREATININE 0.46 <0.30* 1.06* 0.9 0.60  GFRNONAA >60 NOT CALCULATED 56*  --  >60  GFRAA >60 NOT CALCULATED >60  --  >60    LIVER FUNCTION TESTS:  Recent Labs  12/26/14 0939 01/03/15 0427 01/08/15 1214 01/09/15 1007  BILITOT 0.37 0.8 0.7 0.31  AST _0 ALT _1 ALKPHOS 70 66 76 80  PROT 5.7* 6.1* 5.6* 6.0*  ALBUMIN 2.5* 2.7* 2.5* 2.4*    Assessment and Plan: Pt with recurrent NSC lung cancer,  currently on immunotherapy. Plan is for port a cath placement today.Risks and benefits discussed with the patient/family including, but not limited to bleeding, infection, pneumothorax, or fibrin sheath development and need for additional procedures. All of the patient's questions were answered, patient is agreeable to proceed. Consent signed and in chart. K today 3.1, WBC 18.3 (pt on prednisone).      Signed: D. Rowe Robert 01/16/2015, 1:59 PM   I spent a total of 15 minutes in face to face in clinical consultation/evaluation, greater than 50% of which was counseling/coordinating care for port a cath placement

## 2015-01-16 NOTE — Procedures (Signed)
Interventional Radiology Procedure Note  Procedure: Placement of a right IJ approach single lumen PowerPort.  Tip is positioned at the superior cavoatrial junction and catheter is ready for immediate use.  Complications: No immediate Recommendations:  OK to use port. Routine line care   Eulas Post T. Kathlene Cote, M.D Pager:  [phone]

## 2015-01-19 ENCOUNTER — Other Ambulatory Visit: Payer: Self-pay | Admitting: Internal Medicine

## 2015-01-20 ENCOUNTER — Other Ambulatory Visit: Payer: Self-pay | Admitting: Thoracic Surgery (Cardiothoracic Vascular Surgery)

## 2015-01-20 DIAGNOSIS — I313 Pericardial effusion (noninflammatory): Secondary | ICD-10-CM

## 2015-01-20 DIAGNOSIS — I3139 Other pericardial effusion (noninflammatory): Secondary | ICD-10-CM

## 2015-01-21 ENCOUNTER — Encounter (HOSPITAL_COMMUNITY): Payer: Self-pay

## 2015-01-21 ENCOUNTER — Ambulatory Visit (HOSPITAL_COMMUNITY)
Admission: RE | Admit: 2015-01-21 | Discharge: 2015-01-21 | Disposition: A | Discharge status: Home / Self Care | Payer: BLUE CROSS/BLUE SHIELD | Source: Ambulatory Visit | Attending: Internal Medicine | Admitting: Internal Medicine

## 2015-01-21 DIAGNOSIS — J90 Pleural effusion, not elsewhere classified: Secondary | ICD-10-CM | POA: Diagnosis not present

## 2015-01-21 DIAGNOSIS — J9819 Other pulmonary collapse: Secondary | ICD-10-CM | POA: Diagnosis not present

## 2015-01-21 DIAGNOSIS — I7090 Unspecified atherosclerosis: Secondary | ICD-10-CM | POA: Insufficient documentation

## 2015-01-21 DIAGNOSIS — C3491 Malignant neoplasm of unspecified part of right bronchus or lung: Secondary | ICD-10-CM | POA: Diagnosis present

## 2015-01-21 DIAGNOSIS — J439 Emphysema, unspecified: Secondary | ICD-10-CM | POA: Diagnosis not present

## 2015-01-21 DIAGNOSIS — R590 Localized enlarged lymph nodes: Secondary | ICD-10-CM | POA: Insufficient documentation

## 2015-01-21 MED ORDER — IOHEXOL 300 MG/ML  SOLN
75.0000 mL | Freq: Once | INTRAMUSCULAR | Status: AC | PRN
  Administered 2015-01-21: 75 mL via INTRAVENOUS

## 2015-01-22 ENCOUNTER — Ambulatory Visit: Payer: BLUE CROSS/BLUE SHIELD | Admitting: Thoracic Surgery (Cardiothoracic Vascular Surgery)

## 2015-01-22 ENCOUNTER — Encounter: Payer: Self-pay | Admitting: Internal Medicine

## 2015-01-22 ENCOUNTER — Other Ambulatory Visit: Payer: Self-pay | Admitting: Physician Assistant

## 2015-01-23 ENCOUNTER — Encounter: Payer: Self-pay | Admitting: Physician Assistant

## 2015-01-23 ENCOUNTER — Ambulatory Visit: Payer: BLUE CROSS/BLUE SHIELD

## 2015-01-23 ENCOUNTER — Other Ambulatory Visit (HOSPITAL_BASED_OUTPATIENT_CLINIC_OR_DEPARTMENT_OTHER): Payer: BLUE CROSS/BLUE SHIELD

## 2015-01-23 ENCOUNTER — Ambulatory Visit (HOSPITAL_BASED_OUTPATIENT_CLINIC_OR_DEPARTMENT_OTHER): Payer: BLUE CROSS/BLUE SHIELD | Admitting: Physician Assistant

## 2015-01-23 ENCOUNTER — Encounter: Payer: Self-pay | Admitting: Thoracic Surgery (Cardiothoracic Vascular Surgery)

## 2015-01-23 ENCOUNTER — Ambulatory Visit
Admission: RE | Admit: 2015-01-23 | Discharge: 2015-01-23 | Disposition: A | Discharge status: Home / Self Care | Payer: BLUE CROSS/BLUE SHIELD | Source: Ambulatory Visit | Attending: Thoracic Surgery (Cardiothoracic Vascular Surgery) | Admitting: Thoracic Surgery (Cardiothoracic Vascular Surgery)

## 2015-01-23 ENCOUNTER — Ambulatory Visit (INDEPENDENT_AMBULATORY_CARE_PROVIDER_SITE_OTHER): Payer: BLUE CROSS/BLUE SHIELD | Admitting: Thoracic Surgery (Cardiothoracic Vascular Surgery)

## 2015-01-23 VITALS — BP 106/68 | HR 68 | Temp 97.7°F | Resp 18 | Ht 63.0 in | Wt 124.6 lb

## 2015-01-23 VITALS — BP 101/73 | HR 114 | Resp 18 | Ht 63.0 in | Wt 124.0 lb

## 2015-01-23 DIAGNOSIS — J90 Pleural effusion, not elsewhere classified: Secondary | ICD-10-CM | POA: Diagnosis not present

## 2015-01-23 DIAGNOSIS — C3490 Malignant neoplasm of unspecified part of unspecified bronchus or lung: Secondary | ICD-10-CM

## 2015-01-23 DIAGNOSIS — I3139 Other pericardial effusion (noninflammatory): Secondary | ICD-10-CM

## 2015-01-23 DIAGNOSIS — Z79899 Other long term (current) drug therapy: Secondary | ICD-10-CM

## 2015-01-23 DIAGNOSIS — I313 Pericardial effusion (noninflammatory): Secondary | ICD-10-CM

## 2015-01-23 DIAGNOSIS — R0602 Shortness of breath: Secondary | ICD-10-CM

## 2015-01-23 DIAGNOSIS — C3491 Malignant neoplasm of unspecified part of right bronchus or lung: Secondary | ICD-10-CM | POA: Diagnosis not present

## 2015-01-23 DIAGNOSIS — I319 Disease of pericardium, unspecified: Secondary | ICD-10-CM

## 2015-01-23 DIAGNOSIS — J910 Malignant pleural effusion: Secondary | ICD-10-CM

## 2015-01-23 LAB — COMPREHENSIVE METABOLIC PANEL (CC13)
ALBUMIN: 2.3 g/dL — AB (ref 3.5–5.0)
ALK PHOS: 70 U/L (ref 40–150)
ALT: 14 U/L (ref 0–55)
AST: 13 U/L (ref 5–34)
Anion Gap: 14 mEq/L — ABNORMAL HIGH (ref 3–11)
BILIRUBIN TOTAL: 0.26 mg/dL (ref 0.20–1.20)
BUN: 6 mg/dL — ABNORMAL LOW (ref 7.0–26.0)
CO2: 37 mEq/L — ABNORMAL HIGH (ref 22–29)
Calcium: 9.6 mg/dL (ref 8.4–10.4)
Chloride: 89 mEq/L — ABNORMAL LOW (ref 98–109)
Creatinine: 0.6 mg/dL (ref 0.6–1.1)
Glucose: 192 mg/dl — ABNORMAL HIGH (ref 70–140)
Potassium: 3.5 mEq/L (ref 3.5–5.1)
SODIUM: 139 meq/L (ref 136–145)
TOTAL PROTEIN: 5.8 g/dL — AB (ref 6.4–8.3)

## 2015-01-23 LAB — CBC WITH DIFFERENTIAL/PLATELET
BASO%: 0.2 % (ref 0.0–2.0)
Basophils Absolute: 0 10*3/uL (ref 0.0–0.1)
EOS%: 3.9 % (ref 0.0–7.0)
Eosinophils Absolute: 0.7 10*3/uL — ABNORMAL HIGH (ref 0.0–0.5)
HCT: 37.5 % (ref 34.8–46.6)
HEMOGLOBIN: 11.8 g/dL (ref 11.6–15.9)
LYMPH%: 2.7 % — ABNORMAL LOW (ref 14.0–49.7)
MCH: 30.3 pg (ref 25.1–34.0)
MCHC: 31.5 g/dL (ref 31.5–36.0)
MCV: 96.2 fL (ref 79.5–101.0)
MONO#: 1.5 10*3/uL — AB (ref 0.1–0.9)
MONO%: 9.2 % (ref 0.0–14.0)
NEUT%: 84 % — ABNORMAL HIGH (ref 38.4–76.8)
NEUTROS ABS: 13.9 10*3/uL — AB (ref 1.5–6.5)
PLATELETS: 333 10*3/uL (ref 145–400)
RBC: 3.9 10*6/uL (ref 3.70–5.45)
RDW: 13.6 % (ref 11.2–14.5)
WBC: 16.6 10*3/uL — AB (ref 3.9–10.3)
lymph#: 0.5 10*3/uL — ABNORMAL LOW (ref 0.9–3.3)

## 2015-01-23 LAB — TSH CHCC: TSH: 1.259 m(IU)/L (ref 0.308–3.960)

## 2015-01-23 MED ORDER — HYDROCODONE-HOMATROPINE 5-1.5 MG/5ML PO SYRP: 5.0000 mL | ORAL_SOLUTION | Freq: Four times a day (QID) | ORAL | Status: AC | PRN

## 2015-01-23 NOTE — Progress Notes (Addendum)
[place] [phone]   [phone]  OFFICE PROGRESS NOTE   DIAGNOSIS: Recurrent non-small cell lung cancer initially diagnosed as Stage IIIA non-small cell lung cancer, adenocarcinoma with negative EGFR mutation and negative ALK gene translocation diagnosed in March of 2014   PRIOR THERAPY:  1) Concurrent chemoradiation with weekly carboplatin for AUC of 2 and paclitaxel 45 mg/M2, status post 8 cycles, last dose was given on 11/13/2012 with partial response.  2) Consolidation chemotherapy with carboplatin for AUC of 5 on day 1 and gemcitabine 1000 mg/M2 on days 1 and 8 every 3 weeks, status post 3 cycles, last dose was given 02/20/2013 with mild improvement in her disease . First cycle was given on 01/02/2013.  3) status post subxiphoid pericardial window under the care of Dr. Roxan Hockey on 01/04/2014 and the final pathology showed no evidence for malignancy. 4) Systemic chemotherapy with carboplatin for AUC of 5 and Alimta 500 MG/M2 every 3 weeks. First dose 08/20/2014 discontinued secondary to intolerance  CURRENT THERAPY: Immunotherapy with Nivolumab 3 MG/KG every 2 weeks, first dose 11/28/2014. Status post 4 cycles.  CHEMOTHERAPY INTENT: Control  CURRENT # OF CHEMOTHERAPY CYCLES: 4 CURRENT ANTIEMETICS: Zofran, dexamethasone and Compazine  CURRENT SMOKING STATUS: Former smoker, quit in [place]: None  CURRENT BISPHOSPHONATES USE: None  PAIN MANAGEMENT: 8/10, currently Percocet 5/325, 1-2 tabs q 4 hours  NARCOTICS INDUCED CONSTIPATION: None  LIVING WILL AND CODE STATUS: Full code  INTERVAL HISTORY: Linda Donovan 59 y.o. female returns to the clinic today for followup visit accompanied by her husband. The patient is currently on treatment with immunotherapy with Nivolumab status post 4 cycles and tolerating her treatment well. She continues to have shortness of breath and currently on home oxygen currently on 4  L/minute. The patient also has dry cough. She currently has a Pleurex catheter that is being drained three times a week of 300-400 mls. She denied having any significant chest pain or hemoptysis. She has no significant weight loss or night sweats. She has no fever or chills. She has no nausea or vomiting. She is here today to start cycle #5 of her immunotherapy. She recently had a restaging Ct scan of her chest to re-evaluate her disease and is here to discuss the results.  MEDICAL HISTORY: Past Medical History  Diagnosis Date  . COPD (chronic obstructive pulmonary disease)   . ADD (attention deficit disorder)   . Hx of radiation therapy 09/28/12- 11/17/12    RU lung mass, mediastinum chest, 63 gray 35 fx  . Pneumonia   . Arthritis   . Non-small cell lung cancer 09/18/2012    RUL  . Asthma   . Malignant pleural effusion     ALLERGIES:  is allergic to clinoril and morphine.  MEDICATIONS:  Current Outpatient Prescriptions  Medication Sig Dispense Refill  . ALPRAZolam (XANAX) 1 MG tablet Take 1 tablet (1 mg total) by mouth 3 (three) times daily. 90 tablet 0  . amitriptyline (ELAVIL) 50 MG tablet Take 1 tablet (50 mg total) by mouth daily. 30 tablet 1  . aspirin 81 MG tablet Take 81 mg by mouth at bedtime.     . benzonatate (TESSALON) 200 MG capsule Take 200 mg by mouth 3 (three) times daily.   2  . dextromethorphan-guaiFENesin (MUCINEX DM) 30-600 MG per 12 hr tablet Take 1 tablet by mouth 2 (two) times daily as needed for cough.    . Difluprednate (DUREZOL) 0.05 %  EMUL Place 1 drop into the left eye 4 (four) times daily.    Marland Kitchen docusate sodium (COLACE) 100 MG capsule Take 1 capsule (100 mg total) by mouth 2 (two) times daily. 10 capsule 0  . Fluticasone Furoate-Vilanterol (BREO ELLIPTA) 100-25 MCG/INH AEPB Inhale 1 puff into the lungs daily. 60 each 3  . folic acid (FOLVITE) 1 MG tablet Take 1 tablet (1 mg total) by mouth daily. 30 tablet 4  . furosemide (LASIX) 40 MG tablet Take 1 tablet (40  mg total) by mouth as needed. As needed for edema 60 tablet 0  . HYDROcodone-homatropine (HYCODAN) 5-1.5 MG/5ML syrup Take 5 mLs by mouth every 6 (six) hours as needed for cough. 240 mL 0  . ipratropium (ATROVENT) 0.02 % nebulizer solution Take 2.5 mLs (0.5 mg total) by nebulization 4 (four) times daily. 75 mL 12  . levalbuterol (XOPENEX HFA) 45 MCG/ACT inhaler Inhale 2 puffs into the lungs every 4 (four) hours as needed for wheezing. (Patient taking differently: Inhale 2 puffs into the lungs every 6 (six) hours as needed for wheezing or shortness of breath. ) 1 Inhaler 11  . levalbuterol (XOPENEX) 0.63 MG/3ML nebulizer solution Take 3 mLs (0.63 mg total) by nebulization 4 (four) times daily. 3 mL 12  . losartan (COZAAR) 25 MG tablet Take 0.5 tablets (12.5 mg total) by mouth daily. 15 tablet 3  . metoprolol succinate (TOPROL-XL) 25 MG 24 hr tablet Take 25 mg by mouth daily.  5  . oxyCODONE-acetaminophen (PERCOCET/ROXICET) 5-325 MG per tablet Take 1 tablet by mouth every 4 (four) hours as needed for moderate pain or severe pain.   0  . OXYGEN Inhale 4 L into the lungs continuous.     . polyethylene glycol (MIRALAX / GLYCOLAX) packet Take 17 g by mouth daily as needed for mild constipation or moderate constipation. 14 each 0  . potassium chloride SA (K-DUR,KLOR-CON) 20 MEQ tablet TAKE 1 TABLET BY MOUTH EVERY DAY 30 tablet 0  . predniSONE (DELTASONE) 10 MG tablet Take 10 mg by mouth daily.  3  . sucralfate (CARAFATE) 1 G tablet Take 1 tablet (1 g total) by mouth 4 (four) times daily -  with meals and at bedtime. Dissolve in 10 ml of water prior to taking 120 tablet 0  . traZODone (DESYREL) 50 MG tablet Take 50 mg by mouth at bedtime.      No current facility-administered medications for this visit.    SURGICAL HISTORY:  Past Surgical History  Procedure Laterality Date  . Total abdominal hysterectomy    . Shoulder arthroscopy    . Video bronchoscopy Bilateral 07/25/2012    Procedure: VIDEO  BRONCHOSCOPY WITH FLUORO;  Surgeon: Tanda Rockers, MD;  Location: [facility];  Service: Endoscopy;  Laterality: Bilateral;  . Lump removed from breasr right  2003  . Endobronchial ultrasound Bilateral 09/04/2012    Procedure: ENDOBRONCHIAL ULTRASOUND;  Surgeon: Collene Gobble, MD;  Location: [facility];  Service: Cardiopulmonary;  Laterality: Bilateral;  . Breast surgery    . Subxyphoid pericardial window N/A 01/04/2014    Procedure: SUBXYPHOID PERICARDIAL WINDOW;  Surgeon: [name], MD;  Location: [facility];  Service: Thoracic;  Laterality: N/A;  . Intraoperative transesophageal echocardiogram N/A 01/04/2014    Procedure: INTRAOPERATIVE TRANSESOPHAGEAL ECHOCARDIOGRAM;  Surgeon: [name], MD;  Location: [facility];  Service: Open Heart Surgery;  Laterality: N/A;  . Chest tube insertion Right 01/08/2015    Procedure: INSERTION PLEURAL DRAINAGE CATHETER;  Surgeon: [name],  MD;  Location: [facility];  Service: Thoracic;  Laterality: Right;    REVIEW OF SYSTEMS:  Constitutional: positive for fatigue Eyes: negative Ears, nose, mouth, throat, and face: negative Respiratory: positive for cough, dyspnea on exertion and wheezing Cardiovascular: negative Gastrointestinal: negative Genitourinary:negative Integument/breast: negative Hematologic/lymphatic: negative Musculoskeletal:negative Neurological: negative Behavioral/Psych: negative Endocrine: negative Allergic/Immunologic: negative   PHYSICAL EXAMINATION: General appearance: alert, cooperative, fatigued and no distress Head: Normocephalic, without obvious abnormality, atraumatic Neck: no adenopathy, no JVD, supple, symmetrical, trachea midline and thyroid not enlarged, symmetric, no tenderness/mass/nodules Lymph nodes: Cervical, supraclavicular, and axillary nodes normal. Resp: clear to auscultation bilaterally and normal percussion bilaterally Back: symmetric, no curvature. ROM normal. No CVA tenderness. Cardio:  regular rate and rhythm, S1, S2 normal, no murmur, click, rub or gallop and normal apical impulse GI: soft, non-tender; bowel sounds normal; no masses,  no organomegaly Extremities: extremities normal, atraumatic, no cyanosis or edema Neurologic: Alert and oriented X 3, normal strength and tone. Normal symmetric reflexes. Normal coordination and gait  ECOG PERFORMANCE STATUS: 2 - Symptomatic, <50% confined to bed  Blood pressure 106/68, pulse 68, temperature 97.7 F (36.5 C), temperature source Oral, resp. rate 18, height $RemoveBe'5\' 3"'[id number]$  (1.6 m), weight 124 lb 9.6 oz (56.518 kg), SpO2 92 %.  LABORATORY DATA: Lab Results  Component Value Date   WBC 16.6* 01/23/2015   HGB 11.8 01/23/2015   HCT 37.5 01/23/2015   MCV 96.2 01/23/2015   PLT 333 01/23/2015      Chemistry      Component Value Date/Time   NA 139 01/23/2015 0913   NA 135 01/16/2015 1245   K 3.5 01/23/2015 0913   K 3.1* 01/16/2015 1245   CL 84* 01/16/2015 1245   CL 104 11/13/2012 1122   CO2 37* 01/23/2015 0913   CO2 41* 01/16/2015 1245   BUN 6.0* 01/23/2015 0913   BUN 8 01/16/2015 1245   CREATININE 0.6 01/23/2015 0913   CREATININE 0.60 01/16/2015 1245      Component Value Date/Time   CALCIUM 9.6 01/23/2015 0913   CALCIUM 9.4 01/16/2015 1245   ALKPHOS 70 01/23/2015 0913   ALKPHOS 76 01/08/2015 1214   AST 13 01/23/2015 0913   AST 23 01/08/2015 1214   ALT 14 01/23/2015 0913   ALT 14 01/08/2015 1214   BILITOT 0.26 01/23/2015 0913   BILITOT 0.7 01/08/2015 1214       RADIOGRAPHIC STUDIES: Dg Chest 1 View  12/25/2014   CLINICAL DATA:  Post thoracentesis.  EXAM: CHEST  1 VIEW  COMPARISON:  Chest x-ray from earlier same day.  FINDINGS: Interval resolution of the right pleural effusion, at the right lung base, status post thoracentesis. Opacity at the right lung apex is unchanged and, by a previous chest x-ray report, consistent with a known malignancy. Left lung appears stable with hyper expansion and probable basilar  scarring/fibrosis.  Cardiomediastinal silhouette is stable in size and configuration. No acute osseous abnormality identified. No pneumothorax seen.  IMPRESSION: Interval resolution of the pleural effusion at the right lung base status post thoracentesis. No procedural complicating feature identified.   Electronically Signed   By: Linda Donovan M.D.   On: 12/25/2014 15:12   Dg Chest 2 View  01/23/2015   CLINICAL DATA:  History of right pleural effusion, COPD, and right lung malignancy treated with radiation therapy  EXAM: CHEST  2 VIEW  COMPARISON:  CT scan of the chest of January 21, 2015 and PA and lateral chest x-ray of January 08, 2015  FINDINGS:  Since the earlier studies opacification of the right upper hemi thorax has become complete. There does remain a small amount of aerated mid and lower lung. There is shift of the mediastinum toward the right which chronic. The left lung is well-expanded and clear. The cardiac silhouette is not enlarged. There is a right pleural the observed bony thorax exhibits no acute abnormality. Catheter present whose tip appears to lie in in the anterior costophrenic gutter.  IMPRESSION: Progressive opacification of the right hemithorax superiorly likely due to postobstructive atelectasis. The volume of pleural fluid does not appear dramatically changed since the CT scan.   Electronically Signed   By: Linda  Donovan M.D.   On: 01/23/2015 16:01   Dg Chest 2 View  01/08/2015   CLINICAL DATA:  Insertion of right-sided pleural drainage catheter. History of COPD and pneumonia and malignant pleural effusion.  EXAM: CHEST  2 VIEW  COMPARISON:  01/03/2015  FINDINGS: No pleural drainage catheter is evident.  No pneumothorax.  There is volume loss on the righ, as well as masslike opacity sent over the right hilum, coarse reticular patchy airspace opacity in the right mid to upper lung and pleural thickening over the right apex. This is stable.  On the lateral view there is evidence of the  loculated posterior right pleural effusion.  Left lung is hyperexpanded with changes of emphysema, but no evidence of pneumonia or edema. No left pleural effusion or pneumothorax.  IMPRESSION: 1. No significant change from the recent prior exam. 2. Appearance of the right lung and hemi thorax is stable from the most recent prior study. 3. No pleural drainage catheter is seen.  No pneumothorax.   Electronically Signed   By: Linda Donovan M.D.   On: 01/08/2015 12:51   Dg Chest 2 View  01/02/2015   CLINICAL DATA:  History of lung carcinoma, short of breath over the last several days, history of recurring pleural effusions  EXAM: CHEST  2 VIEW  COMPARISON:  Chest x-ray of 12/25/2014 and CT chest of 11/26/2014  FINDINGS: There is now more opacity at the right lung base most consistent with recurrent right pleural effusion and postoperative change with volume loss. Poor aeration of the right upper lung field again is noted. The left lung is clear and somewhat hyperaerated. Heart size is stable. No bony abnormality is seen.  IMPRESSION: More opacity at the right lung base most consistent with increasing right pleural effusion.   Electronically Signed   By: Ivar Drape M.D.   On: 01/02/2015 14:25   Dg Chest 2 View  12/25/2014   CLINICAL DATA:  59 year old female with shortness of breath. Non-small cell lung cancer. Subsequent encounter.  EXAM: CHEST  2 VIEW  COMPARISON:  12/18/2014 and earlier  FINDINGS: Interval reaccumulation of the loculated right pleural effusion, but less severe than on 12/18/2014. Mild residual right lung ventilation. Stable visualized mediastinal contours. The left lung appears stable. No pneumothorax. Stable visualized osseous structures.  IMPRESSION: Re-accumulation of the loculated right pleural effusion, but less severe than that seen prior to the 12/18/2014 thoracentesis.  Otherwise stable chest.   Electronically Signed   By: Genevie Ann M.D.   On: 12/25/2014 10:42   Ct Chest W  Contrast  01/21/2015   CLINICAL DATA:  Right non-small cell lung cancer for restaging. Chemotherapy ongoing. Subsequent encounter.  EXAM: CT CHEST WITH CONTRAST  TECHNIQUE: Multidetector CT imaging of the chest was performed during intravenous contrast administration.  CONTRAST:  45mL OMNIPAQUE IOHEXOL 300 MG/ML  SOLN  COMPARISON:  Chest CT 11/26/2014 and 09/12/2014.  FINDINGS: Mediastinum/Nodes: There is progressive right supraclavicular and subpectoral lymphadenopathy, the largest node measuring 1.9 x 2.0 cm on image 7. No discretely enlarged mediastinal or hilar lymph nodes are demonstrated, although there is increasing confluent soft tissue in the right perihilar and subcarinal regions, contiguous with progressive right pleural metastatic disease.There is progressive enlargement of multiple anterior mediastinal and right pericardiac tumor implants. The thyroid gland, trachea and esophagus demonstrate no significant findings. The heart size is normal. There is no pericardial effusion. Right IJ Port-A-Cath tip is in the lower SVC. There is stable mild atherosclerosis.  Lungs/Pleura: There is progressive nodular pleural thickening throughout the right hemithorax consistent with progressive pleural tumor. Multiple suspected areas of chest wall extension of tumor, especially superiorly, without gross bone destruction. Small caliber pleural drain is present posterolaterally in the lower pleural space. The volume of the loculated pleural effusion has decreased from the prior study. There is no significant pleural fluid on the left. There is progressive right perihilar tumor with occlusion of the upper lobe bronchus and complete opacification of the right upper lobe. There are multiple air bubbles within the right upper lobe lung parenchyma with surrounding low density consistent with necrosis. No nodules are visualized within the aerated portions of the right lung. Severe emphysematous changes are again noted  throughout the left lung.  Upper abdomen: Enlarged gastrohepatic node measuring 12 mm on image 52. No evidence of adrenal mass. The visualized liver demonstrates no suspicious findings.  Musculoskeletal/Chest wall: Soft tissue nodularity and calcifications throughout the breasts, right-greater-than-left are grossly stable. No lytic lesion or new fracture demonstrated. A mild superior endplate compression deformity at T5 is unchanged.  IMPRESSION: 1. Progressive pleural tumor throughout the right hemithorax with suspected chest wall extension, adjacent contiguous right hilar/subcarinal, right supraclavicular and gastrohepatic ligament lymphadenopathy. 2. 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 3. The volume of the pleural fluid on the right has slightly improved following pleural drain placement.   Electronically Signed   By: Linda Donovan M.D.   On: 01/21/2015 16:19   [place] Chest  01/02/2015   CLINICAL DATA:  Right pleural effusion.  EXAM: CHEST ULTRASOUND  COMPARISON:  Chest x-ray dated 01/02/2015  FINDINGS: There is a large pocket of pleural fluid seen posteriorly in the right hemi thorax. However, because of hypotension, thoracentesis was not performed.  IMPRESSION: There is a large pocket of pleural fluid in the right hemi thorax. Thoracentesis not performed because of hypotension.   Electronically Signed   By: Linda Donovan M.D.   On: 01/02/2015 17:04   Ir Fluoro Guide Cv Line Right  01/16/2015   CLINICAL DATA:  History of adenocarcinoma of the right lung. The patient requires a porta cath for continued chemotherapy needs.  EXAM: IMPLANTED PORT A CATH PLACEMENT WITH ULTRASOUND AND FLUOROSCOPIC GUIDANCE  ANESTHESIA/SEDATION: 3.0 Mg IV Versed; 25 mcg IV Fentanyl  Total Moderate Sedation Time:  25 minutes.  Additional Medications: 2 g IV Ancef. As antibiotic prophylaxis, Ancef was ordered pre-procedure and administered intravenously within one hour of  incision.  FLUOROSCOPY TIME:  18 seconds.  PROCEDURE: The procedure, risks, benefits, and alternatives were explained to the patient. Questions regarding the procedure were encouraged and answered. The patient understands and consents to the procedure. A time-out was performed prior to the procedure.  The right neck and chest were prepped with chlorhexidine in a sterile fashion, and a sterile drape was applied  covering the operative field. Maximum barrier sterile technique with sterile gowns and gloves were used for the procedure. Local anesthesia was provided with 1% lidocaine.  Ultrasound was used to confirm patency of the right internal jugular vein. After creating a small venotomy incision, a 21 gauge needle was advanced into the right internal jugular vein under direct, real-time ultrasound guidance. Ultrasound image documentation was performed. After securing guidewire access, an 8 Fr dilator was placed. A J-wire was kinked to measure appropriate catheter length.  A subcutaneous port pocket was then created along the upper chest wall utilizing sharp and blunt dissection. Portable cautery was utilized. The pocket was irrigated with sterile saline.  A single lumen power injectable port was chosen for placement. The 8 Fr catheter was tunneled from the port pocket site to the venotomy incision. The port was placed in the pocket. External catheter was trimmed to appropriate length based on guidewire measurement.  At the venotomy, an 8 Fr peel-away sheath was placed over a guidewire. The catheter was then placed through the sheath and the sheath removed. Final catheter positioning was confirmed and documented with a fluoroscopic spot image. The port was accessed with a needle and aspirated and flushed with heparinized saline. The needle was removed.  The venotomy and port pocket incisions were closed with subcutaneous 3-0 Monocryl and subcuticular 4-0 Vicryl. Dermabond was applied to both incisions.  COMPLICATIONS:  None  FINDINGS: After catheter placement, the tip lies at the cavoatrial junction. The catheter aspirates normally and is ready for immediate use.  IMPRESSION: Placement of single lumen port a cath via right internal jugular vein. The catheter tip lies at the cavoatrial junction. A power injectable port a cath was placed and is ready for immediate use.   Electronically Signed   By: Linda Donovan M.D.   On: 01/16/2015 17:22   Ir US Guide Vasc Access Right  01/16/2015   CLINICAL DATA:  History of adenocarcinoma of the right lung. The patient requires a porta cath for continued chemotherapy needs.  EXAM: IMPLANTED PORT A CATH PLACEMENT WITH ULTRASOUND AND FLUOROSCOPIC GUIDANCE  ANESTHESIA/SEDATION: 3.0 Mg IV Versed; 25 mcg IV Fentanyl  Total Moderate Sedation Time:  25 minutes.  Additional Medications: 2 g IV Ancef. As antibiotic prophylaxis, Ancef was ordered pre-procedure and administered intravenously within one hour of incision.  FLUOROSCOPY TIME:  18 seconds.  PROCEDURE: The procedure, risks, benefits, and alternatives were explained to the patient. Questions regarding the procedure were encouraged and answered. The patient understands and consents to the procedure. A time-out was performed prior to the procedure.  The right neck and chest were prepped with chlorhexidine in a sterile fashion, and a sterile drape was applied covering the operative field. Maximum barrier sterile technique with sterile gowns and gloves were used for the procedure. Local anesthesia was provided with 1% lidocaine.  Ultrasound was used to confirm patency of the right internal jugular vein. After creating a small venotomy incision, a 21 gauge needle was advanced into the right internal jugular vein under direct, real-time ultrasound guidance. Ultrasound image documentation was performed. After securing guidewire access, an 8 Fr dilator was placed. A J-wire was kinked to measure appropriate catheter length.  A subcutaneous port  pocket was then created along the upper chest wall utilizing sharp and blunt dissection. Portable cautery was utilized. The pocket was irrigated with sterile saline.  A single lumen power injectable port was chosen for placement. The 8 Fr catheter was tunneled from the port pocket site to  the venotomy incision. The port was placed in the pocket. External catheter was trimmed to appropriate length based on guidewire measurement.  At the venotomy, an 8 Fr peel-away sheath was placed over a guidewire. The catheter was then placed through the sheath and the sheath removed. Final catheter positioning was confirmed and documented with a fluoroscopic spot image. The port was accessed with a needle and aspirated and flushed with heparinized saline. The needle was removed.  The venotomy and port pocket incisions were closed with subcutaneous 3-0 Monocryl and subcuticular 4-0 Vicryl. Dermabond was applied to both incisions.  COMPLICATIONS: None  FINDINGS: After catheter placement, the tip lies at the cavoatrial junction. The catheter aspirates normally and is ready for immediate use.  IMPRESSION: Placement of single lumen port a cath via right internal jugular vein. The catheter tip lies at the cavoatrial junction. A power injectable port a cath was placed and is ready for immediate use.   Electronically Signed   By: Linda Donovan M.D.   On: 01/16/2015 17:22   Dg Chest Port 1 View  01/03/2015   CLINICAL DATA:  Pleural effusion, cough, lung cancer, emphysema  EXAM: PORTABLE CHEST - 1 VIEW  COMPARISON:  12/25/2014, CT chest 11/26/2014, chest x-ray 12/18/2014  FINDINGS: Persistent right upper lobe airspace opacity consistent with known malignancy. There is patchy right upper lobe airspace disease most concerning for postobstructive pneumonitis. There is a small right pleural effusion. There is no left pleural effusion, pneumothorax or focal consolidation. There is right lung volume loss. Stable cardiomediastinal silhouette.  No acute osseous abnormality.  IMPRESSION: 1. Persistent right upper lobe airspace opacity consistent with known malignancy. 2. Patchy right upper lobe airspace disease concerning for postobstructive pneumonitis. 3. Small right pleural effusion.   Electronically Signed   By: Kathreen Devoid   On: 01/03/2015 11:15   Dg C-arm 1-60 Min-no Report  01/08/2015   CLINICAL DATA: surgery   C-ARM 1-60 MINUTES  Fluoroscopy was utilized by the requesting physician.  No radiographic  interpretation.    US Thoracentesis Asp Pleural Space W/img Guide  12/25/2014   CLINICAL DATA:  Recurrent pleural effusion due to lung cancer  EXAM: ULTRASOUND GUIDED RIGHT THORACENTESIS  COMPARISON:  None.  PROCEDURE: An ultrasound guided thoracentesis was thoroughly discussed with the patient and questions answered. The benefits, risks, alternatives and complications were also discussed. The patient understands and wishes to proceed with the procedure. Written consent was obtained.  Ultrasound was performed to localize and mark an adequate pocket of fluid in the right chest. The area was then prepped and draped in the normal sterile fashion. 1% Lidocaine was used for local anesthesia. Under ultrasound guidance a Safe T Centesis catheter was introduced. Thoracentesis was performed. The catheter was removed and a dressing applied.  COMPLICATIONS: None immediate.  FINDINGS: A total of approximately 900 mls of dark yellow fluid was removed. A fluid sample was notsent for laboratory analysis.  IMPRESSION: Successful ultrasound guided right thoracentesis yielding 900 mls of pleural fluid.  Read by:  Linda Eagle, PA-C   Electronically Signed   By: Sandi Mariscal M.D.   On: 12/25/2014 14:55    ASSESSMENT AND PLAN: This is a very pleasant 59 years old white female with stage IIIA non-small cell lung cancer, adenocarcinoma with negative EGFR mutation and negative ALK gene translocation is status post concurrent chemoradiation with weekly carboplatin and  paclitaxel followed by consolidation chemotherapy with 3 cycles of carboplatin and gemcitabine. The patient was found to have evidence for  disease progression and was started on systemic chemotherapy with one cycle of carboplatin and Alimta. This was complicated with pancytopenia. She is currently undergoing treatment with immunotherapy with Nivolumab 3 MG/KG every 2 weeks is status post 4 cycles and tolerating her treatment fairly well. The patient was discussed with and also seen by Dr. Julien Nordmann. Her restaging CT scan of the chest showed evidence for disease progression. We will discontinue immunotherapy with nivolumab due to disease progression. We discussed referring the patient to Hospice for comfort care. We will consider starting systemic chemotherapy with either docetaxel or navelbine if she cooses not to go on Hospice and wants further treatment. She will follow up in 2 weeks to inform us of her decision.The patient was advised to call immediately if she has any concerning symptoms in the interval.  The patient voices understanding of current disease status and treatment options and is in agreement with the current care plan.  All questions were answered. The patient knows to call the clinic with any problems, questions or concerns. We can certainly see the patient much sooner if necessary.  Carlton Adam, PA-C 01/23/2015  ADDENDUM: Hematology/Oncology Attending: I had a face to face encounter with the patient. I recommended her care plan. This is a very pleasant 59 years old white female with recurrent non-small cell lung cancer most recently treated with Nivolumab status post 4 cycles. The patient tolerated her treatment well. She continues to have significant shortness of breath and she has drainage of right pleural effusion as well as Pleurx catheter placement recently but no significant improvement in her dyspnea. The recent CT scan of the chest showed evidence for disease progression.  I had a lengthy discussion with the patient and her family today about her current disease status and treatment options. I gave the patient the option of palliative care and hospice referral versus consideration of treatment with second line chemotherapy with docetaxel or Navelbine. The patient her family would like some time to think about these options. She was advised to call once she makes a decision regarding her treatment options. We'll arrange for the patient to come back for follow-up visit in 2 weeks or sooner if needed. She was advised to call immediately if she has any concerning symptoms in the interval.  Disclaimer: This note was dictated with voice recognition software. Similar sounding words can inadvertently be transcribed and may be missed upon review. Linda Kempf., MD 01/28/2015

## 2015-01-23 NOTE — Progress Notes (Signed)
[address]       [place] [address]             [phone]        HPI:  Linda Donovan returns today for follow-up regarding her right pleural catheter.  She is a 59 year old woman with stage IIIB non-small cell carcinoma. She was having difficulty with recurrent malignant right pleural effusions. I placed a pleural catheter on 01/08/2012. Initially they were draining 3 times a week, she is now draining on Mondays and Fridays. She is draining about 400 mL of fluid with each session. She says that her breathing is better with the fluid drained. She completed her fourth cycle of Nivolumab earlier this week.  Past Medical History  Diagnosis Date  . COPD (chronic obstructive pulmonary disease)   . ADD (attention deficit disorder)   . Hx of radiation therapy 09/28/12- 11/17/12    RU lung mass, mediastinum chest, 63 gray 35 fx  . Pneumonia   . Arthritis   . Non-small cell lung cancer 09/18/2012    RUL  . Asthma   . Malignant pleural effusion       Current Outpatient Prescriptions  Medication Sig Dispense Refill  . ALPRAZolam (XANAX) 1 MG tablet Take 1 tablet (1 mg total) by mouth 3 (three) times daily. 90 tablet 0  . amitriptyline (ELAVIL) 50 MG tablet Take 1 tablet (50 mg total) by mouth daily. 30 tablet 1  . aspirin 81 MG tablet Take 81 mg by mouth at bedtime.     . benzonatate (TESSALON) 200 MG capsule Take 200 mg by mouth 3 (three) times daily.   2  . dextromethorphan-guaiFENesin (MUCINEX DM) 30-600 MG per 12 hr tablet Take 1 tablet by mouth 2 (two) times daily as needed for cough.    . Difluprednate (DUREZOL) 0.05 % EMUL Place 1 drop into the left eye 4 (four) times daily.    Marland Kitchen docusate sodium (COLACE) 100 MG capsule Take 1 capsule (100 mg total) by mouth 2 (two) times daily. 10 capsule 0  . Fluticasone Furoate-Vilanterol (BREO ELLIPTA) 100-25 MCG/INH AEPB Inhale 1 puff into the lungs daily. 60 each 3  . folic acid (FOLVITE) 1 MG tablet Take 1 tablet (1 mg  total) by mouth daily. 30 tablet 4  . furosemide (LASIX) 40 MG tablet Take 1 tablet (40 mg total) by mouth as needed. As needed for edema 60 tablet 0  . HYDROcodone-homatropine (HYCODAN) 5-1.5 MG/5ML syrup Take 5 mLs by mouth every 6 (six) hours as needed for cough. 240 mL 0  . ipratropium (ATROVENT) 0.02 % nebulizer solution Take 2.5 mLs (0.5 mg total) by nebulization 4 (four) times daily. 75 mL 12  . levalbuterol (XOPENEX HFA) 45 MCG/ACT inhaler Inhale 2 puffs into the lungs every 4 (four) hours as needed for wheezing. (Patient taking differently: Inhale 2 puffs into the lungs every 6 (six) hours as needed for wheezing or shortness of breath. ) 1 Inhaler 11  . levalbuterol (XOPENEX) 0.63 MG/3ML nebulizer solution Take 3 mLs (0.63 mg total) by nebulization 4 (four) times daily. 3 mL 12  . losartan (COZAAR) 25 MG tablet Take 0.5 tablets (12.5 mg total) by mouth daily. 15 tablet 3  . metoprolol succinate (TOPROL-XL) 25 MG 24 hr tablet Take 25 mg by mouth daily.  5  . oxyCODONE-acetaminophen (PERCOCET/ROXICET) 5-325 MG per tablet Take 1 tablet by mouth every 4 (four) hours as needed for moderate pain or severe pain.  0  . OXYGEN Inhale 4 L into the lungs continuous.     . polyethylene glycol (MIRALAX / GLYCOLAX) packet Take 17 g by mouth daily as needed for mild constipation or moderate constipation. 14 each 0  . potassium chloride SA (K-DUR,KLOR-CON) 20 MEQ tablet TAKE 1 TABLET BY MOUTH EVERY DAY 30 tablet 0  . predniSONE (DELTASONE) 10 MG tablet Take 10 mg by mouth daily.  3  . sucralfate (CARAFATE) 1 G tablet Take 1 tablet (1 g total) by mouth 4 (four) times daily -  with meals and at bedtime. Dissolve in 10 ml of water prior to taking 120 tablet 0  . traZODone (DESYREL) 50 MG tablet Take 50 mg by mouth at bedtime.      No current facility-administered medications for this visit.    Physical Exam BP 101/73 mmHg  Pulse 114  Resp 18  Ht '5\' 3"'$  (1.6 m)  Wt 124 lb (56.246 kg)  BMI 21.97 kg/m2   SpO25 44% 59 year old chronically ill woman appears older than stated age  No acute distress Nasal cannula oxygen in place Absent breath sounds right upper chest, clear breath sounds right base   Diagnostic Tests:  I personally reviewed today's Chest x-ray and CT chest from 01/21/15  The pleural catheter is in good position in the right pleural space. There has been progression of disease and now complete atelectasis of the right upper lobe.  Impression: 59 year old woman with metastatic lung cancer and a malignant right pleural effusion. She is a Pleurx catheter in place. This is in good position and is draining the majority of her pleural effusion. She has had symptomatic improvement, despite the fact that in progression of disease and now complete atelectasis of the right upper lobe. This does not appear to be amenable to interventional bronchoscopy.  She will continue with draining every Monday and Friday.  Plan: Return in 3 weeks a PA and lateral chest x-ray to check on progress.  Linda Nakayama, MD [facility] [phone]

## 2015-01-24 ENCOUNTER — Telehealth: Payer: Self-pay | Admitting: Emergency Medicine

## 2015-01-24 NOTE — Telephone Encounter (Signed)
LM for [facility] to return call.

## 2015-01-27 ENCOUNTER — Other Ambulatory Visit: Payer: Self-pay | Admitting: Emergency Medicine

## 2015-01-27 NOTE — Telephone Encounter (Signed)
Breo was given by RB from 7.5.16 phone note lmtcb x1 for [employer]

## 2015-01-27 NOTE — Telephone Encounter (Signed)
Pt returned call  Please leave the message on her voicemail. She will be with patients all day and unable to talk on the phone.  [phone]

## 2015-01-27 NOTE — Telephone Encounter (Signed)
Left message for KAtelyn at [facility] to return call.

## 2015-01-28 NOTE — Telephone Encounter (Signed)
LM for [employer]

## 2015-01-28 NOTE — Patient Instructions (Signed)
We have recommended Hospice for comfort care. We have also given you information on 2 possible chemotherapy Follow up in 2 weeks

## 2015-01-29 NOTE — Telephone Encounter (Signed)
Spoke with [employer] at [facility] . She states that pt has not been taking Symbicort for several months now.  They picked up Breo from Pharmacy but do not remember our office discussing this with them. Discussed with pt that Symbicort was changed to [facility] due to insurance request (phone note 12/10/14) .  Discussed instructions for using Breo with pt.  Pt verbalized understanding and states she will start using this now.

## 2015-01-29 NOTE — Telephone Encounter (Signed)
Left message for Katelyn at [facility] to call back.

## 2015-02-06 ENCOUNTER — Other Ambulatory Visit: Payer: BLUE CROSS/BLUE SHIELD

## 2015-02-06 ENCOUNTER — Telehealth: Payer: Self-pay | Admitting: Internal Medicine

## 2015-02-06 ENCOUNTER — Ambulatory Visit: Payer: BLUE CROSS/BLUE SHIELD | Admitting: Physician Assistant

## 2015-02-06 ENCOUNTER — Ambulatory Visit: Payer: BLUE CROSS/BLUE SHIELD

## 2015-02-06 NOTE — Telephone Encounter (Signed)
pt called to cx tomorrows appt...done....pt ok adn aware

## 2015-02-07 ENCOUNTER — Other Ambulatory Visit: Payer: Self-pay | Admitting: Thoracic Surgery (Cardiothoracic Vascular Surgery)

## 2015-02-07 ENCOUNTER — Other Ambulatory Visit: Payer: BLUE CROSS/BLUE SHIELD

## 2015-02-07 ENCOUNTER — Ambulatory Visit: Payer: BLUE CROSS/BLUE SHIELD

## 2015-02-07 ENCOUNTER — Ambulatory Visit: Payer: BLUE CROSS/BLUE SHIELD | Admitting: Physician Assistant

## 2015-02-07 DIAGNOSIS — I313 Pericardial effusion (noninflammatory): Secondary | ICD-10-CM

## 2015-02-07 DIAGNOSIS — I3139 Other pericardial effusion (noninflammatory): Secondary | ICD-10-CM

## 2015-02-08 ENCOUNTER — Other Ambulatory Visit: Payer: Self-pay | Admitting: Physician Assistant

## 2015-02-11 ENCOUNTER — Telehealth: Payer: Self-pay | Admitting: Internal Medicine

## 2015-02-11 ENCOUNTER — Ambulatory Visit: Payer: BLUE CROSS/BLUE SHIELD | Admitting: Thoracic Surgery (Cardiothoracic Vascular Surgery)

## 2015-02-11 NOTE — Telephone Encounter (Signed)
Received death certificate 11/09/97

## 2015-02-20 ENCOUNTER — Ambulatory Visit: Payer: BLUE CROSS/BLUE SHIELD | Admitting: Internal Medicine

## 2015-02-20 ENCOUNTER — Other Ambulatory Visit: Payer: BLUE CROSS/BLUE SHIELD

## 2015-02-21 ENCOUNTER — Ambulatory Visit: Payer: BLUE CROSS/BLUE SHIELD | Admitting: Physician Assistant

## 2015-02-21 ENCOUNTER — Ambulatory Visit: Payer: BLUE CROSS/BLUE SHIELD

## 2015-02-21 ENCOUNTER — Other Ambulatory Visit: Payer: BLUE CROSS/BLUE SHIELD

## 2015-03-06 ENCOUNTER — Ambulatory Visit: Payer: BLUE CROSS/BLUE SHIELD | Admitting: Physician Assistant

## 2015-03-06 ENCOUNTER — Other Ambulatory Visit: Payer: BLUE CROSS/BLUE SHIELD

## 2015-03-08 DEATH — deceased

## 2015-03-20 ENCOUNTER — Other Ambulatory Visit: Payer: BLUE CROSS/BLUE SHIELD

## 2015-03-20 ENCOUNTER — Ambulatory Visit: Payer: BLUE CROSS/BLUE SHIELD | Admitting: Physician Assistant

## 2015-06-03 ENCOUNTER — Other Ambulatory Visit: Payer: Self-pay | Admitting: Nurse Practitioner

## 2015-08-03 IMAGING — CR DG CHEST 1V
1 series · 1 of 1 positions shown · non-contrast
Comparison: 12/18/2014; 12/07/2010; 12/06/2014; ultrasound-guided
right-sided thoracentesis - 12/07/2014

CLINICAL DATA: History of lung cancer, now with recurrent
symptomatic right-sided pleural effusion. Post right-sided
thoracentesis.

EXAM:
CHEST  1 VIEW

[w chest pa]
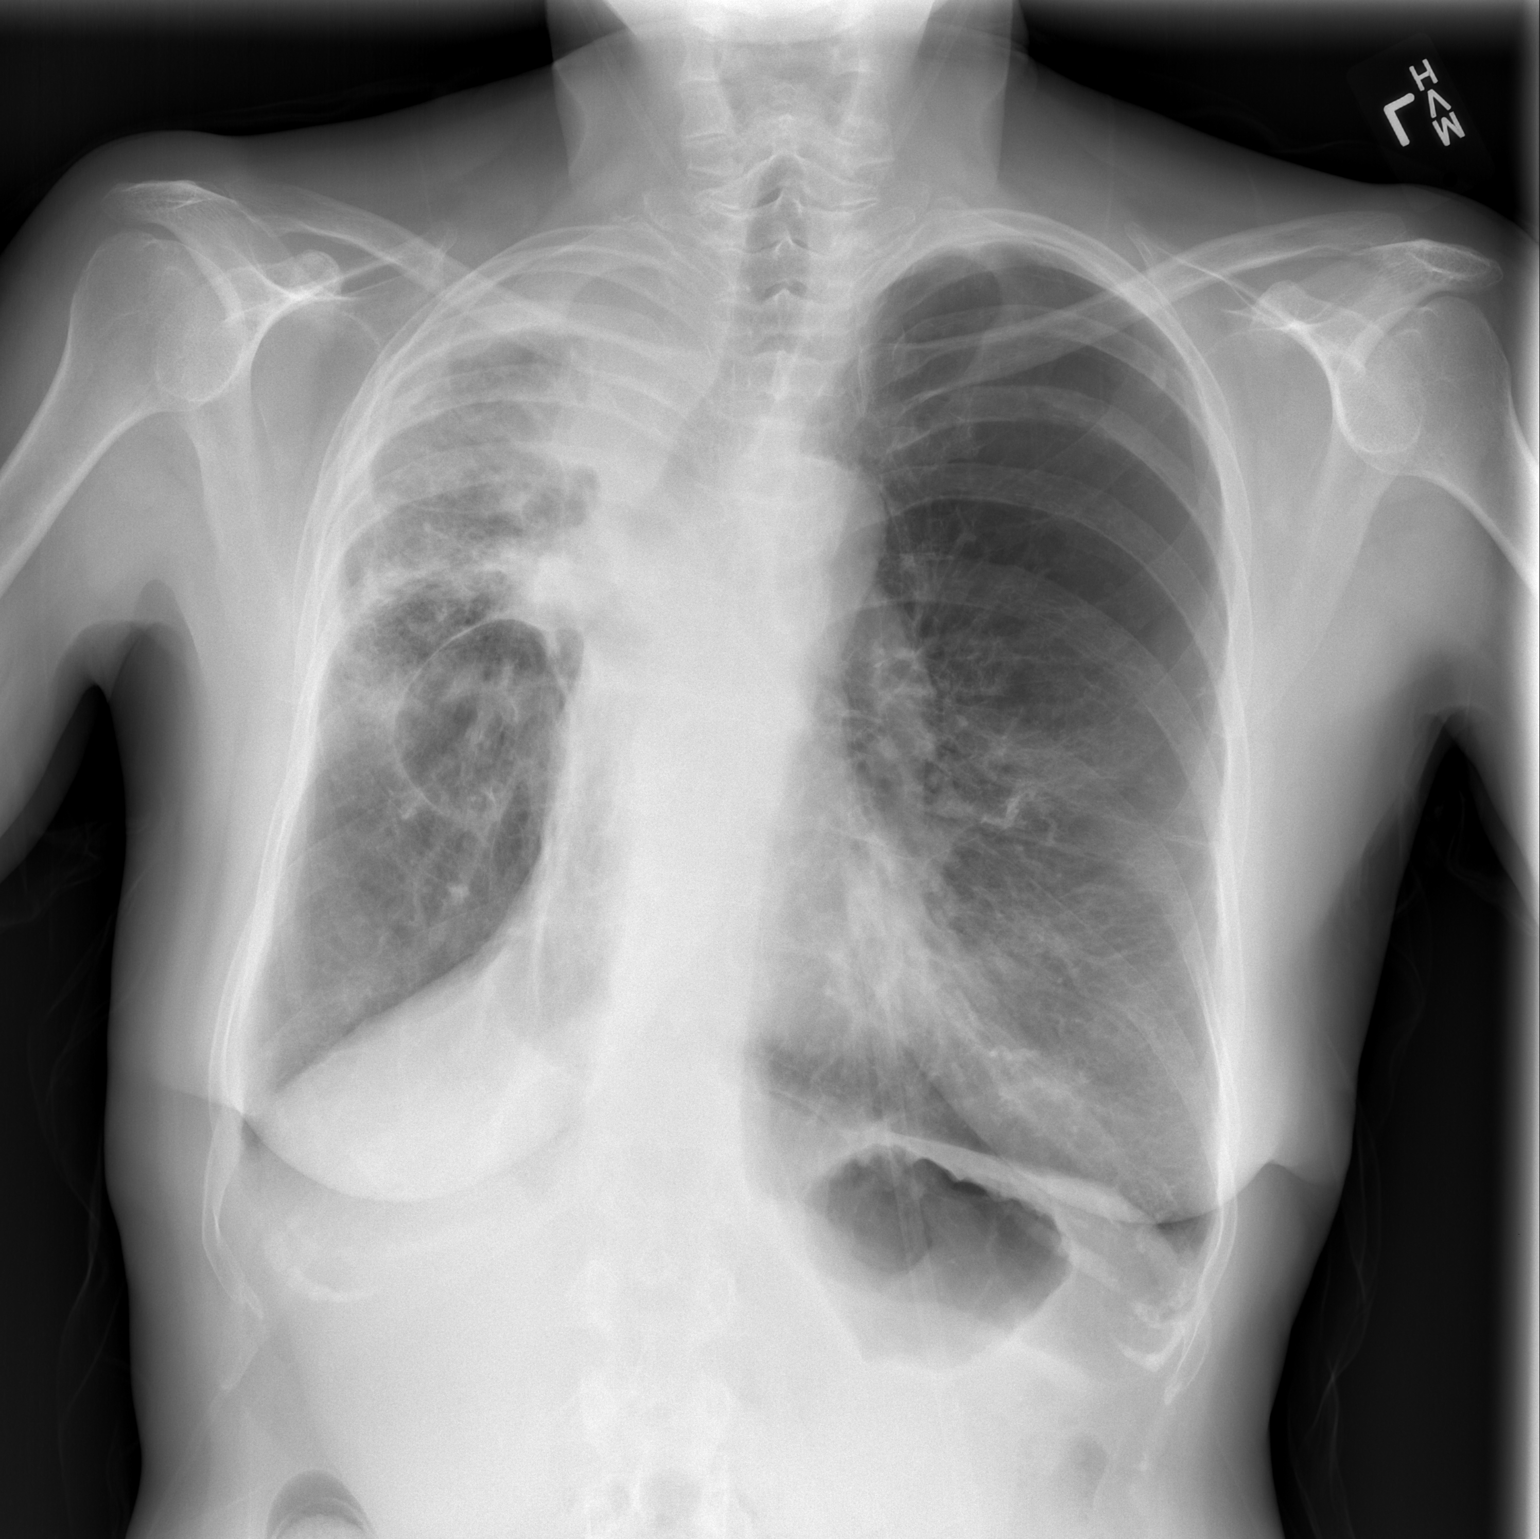

[1 of 1 positions shown; findings below may reference images not displayed]

FINDINGS: Grossly unchanged cardiac silhouette and mediastinal contours.
Significant reduction / near resolution of right-sided pleural
effusion post thoracentesis. No pneumothorax. Marked improved
aeration of the right lung with grossly unchanged consolidative mass
within the medial aspect the right lung apex compatible with known
malignancy. No new focal airspace opacities. The lungs remain
hyperexpanded. No evidence of edema. Unchanged bones.
IMPRESSION: 1. Significant reduction/near resolution of right-sided pleural
effusion post thoracentesis. No pneumothorax.
2. Marked improved aeration of the right lung with grossly unchanged
consolidative mass within medial aspect of the right lung apex
compatible with known malignancy.

## 2015-08-18 IMAGING — US US CHEST/MEDIASTINUM
1 series · 3 of 3 positions shown · non-contrast
Comparison: Chest x-ray dated 01/02/2015

CLINICAL DATA: Right pleural effusion.

EXAM:
CHEST ULTRASOUND

[Series 1: us chest/mediastinum · 0.25mm/px · 3 of 3 slices shown]
[im 1/3]
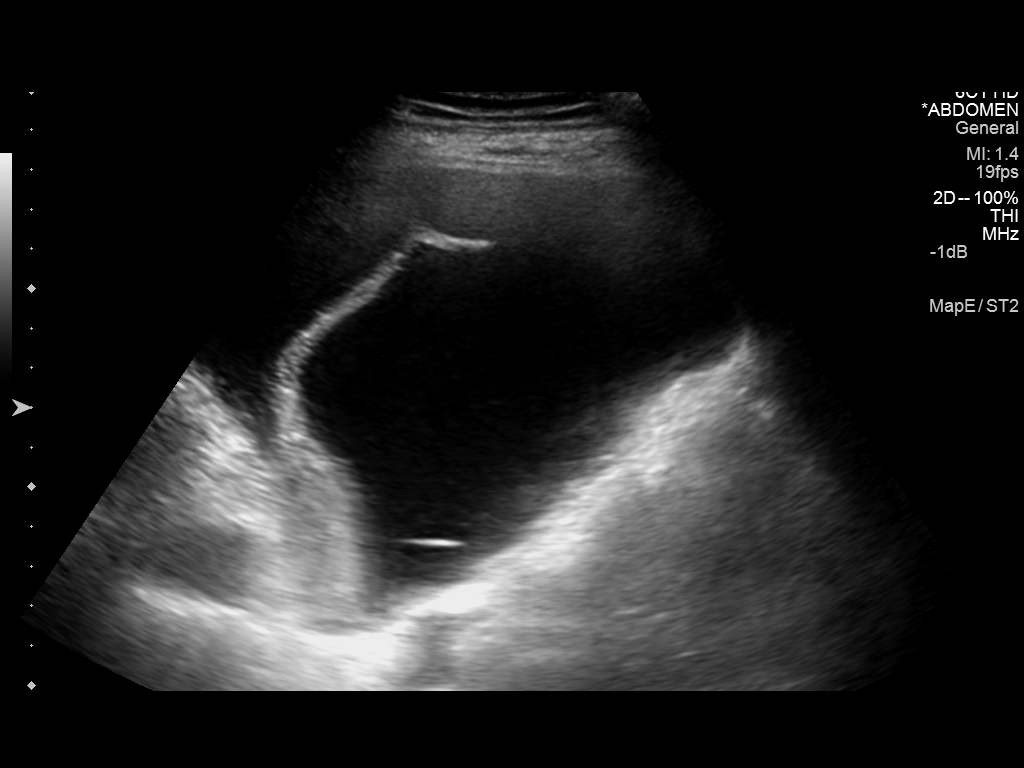
[im 2/3]
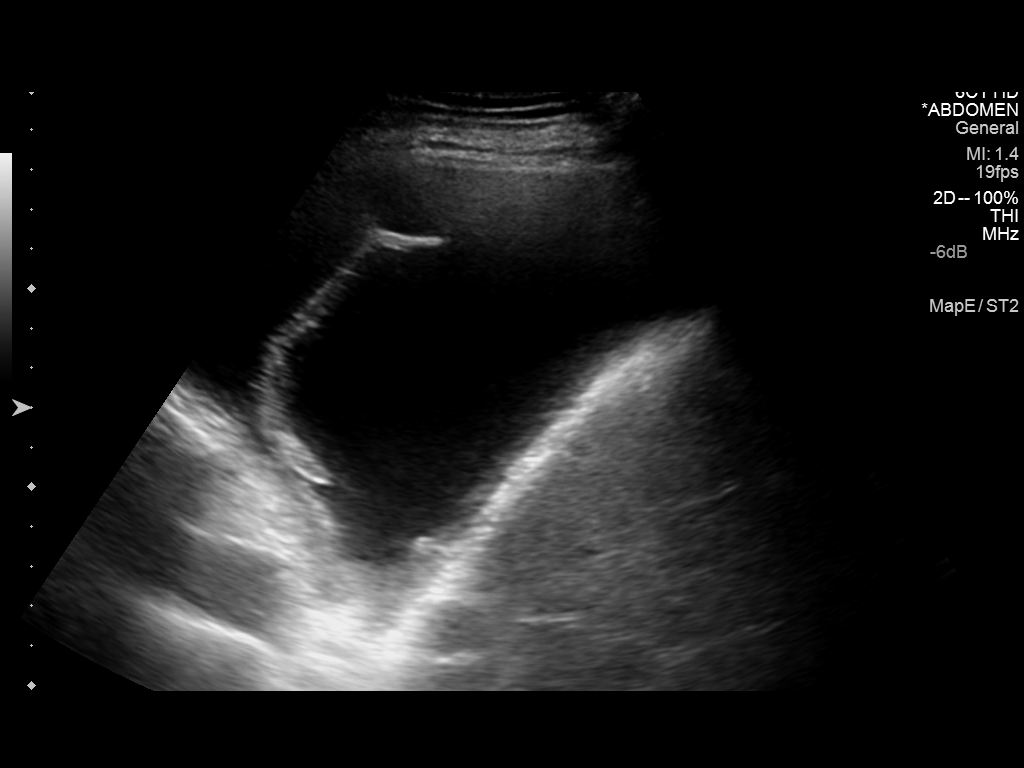
[im 3/3]
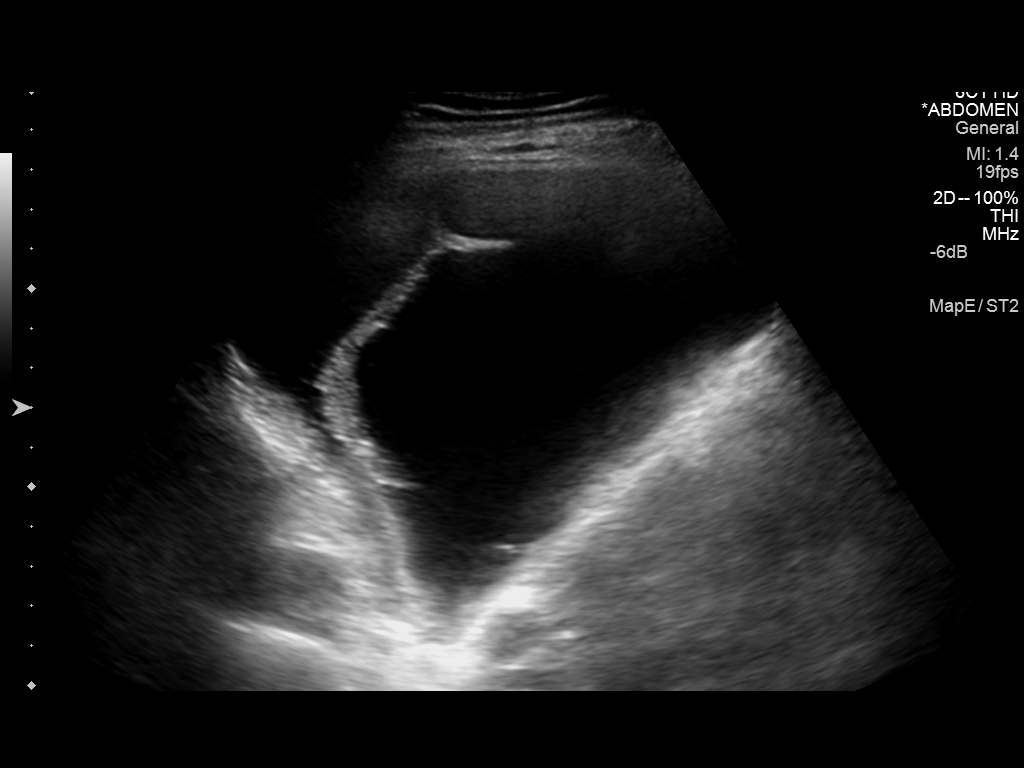

[3 of 3 positions shown; findings below may reference images not displayed]

FINDINGS: There is a large pocket of pleural fluid seen posteriorly in the
right hemi thorax. However, because of hypotension, thoracentesis
was not performed.
IMPRESSION: There is a large pocket of pleural fluid in the right hemi thorax.
Thoracentesis not performed because of hypotension.

## 2015-09-01 IMAGING — US IR FLUORO GUIDE CV LINE*R*
1 series · 1 of 1 positions shown · non-contrast
Comparison: none

CLINICAL DATA: History of adenocarcinoma of the right lung. The
patient requires a porta cath for continued chemotherapy needs.

[Series 1: ir fluoro/shunt/fist · 1 of 1 slices shown]
[im 1/1]
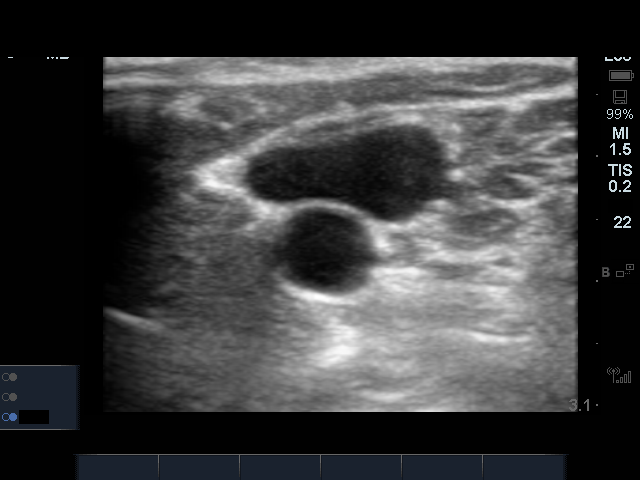

[1 of 1 positions shown; findings below may reference images not displayed]

EXAM:
IMPLANTED PORT A CATH PLACEMENT WITH ULTRASOUND AND FLUOROSCOPIC
GUIDANCE

ANESTHESIA/SEDATION:
3.0 Mg IV Versed; 25 mcg IV Fentanyl

Total Moderate Sedation Time:  25 minutes.

Additional Medications: 2 g IV Ancef. As antibiotic prophylaxis,
Ancef was ordered pre-procedure and administered intravenously
within one hour of incision.

FLUOROSCOPY TIME:  18 seconds.

PROCEDURE:
The procedure, risks, benefits, and alternatives were explained to
the patient. Questions regarding the procedure were encouraged and
answered. The patient understands and consents to the procedure. A
time-out was performed prior to the procedure.

The right neck and chest were prepped with chlorhexidine in a
sterile fashion, and a sterile drape was applied covering the
operative field. Maximum barrier sterile technique with sterile
gowns and gloves were used for the procedure. Local anesthesia was
provided with 1% lidocaine.

Ultrasound was used to confirm patency of the right internal jugular
vein. After creating a small venotomy incision, a 21 gauge needle
was advanced into the right internal jugular vein under direct,
real-time ultrasound guidance. Ultrasound image documentation was
performed. After securing guidewire access, an 8 Fr dilator was
placed. A J-wire was kinked to measure appropriate catheter length.

A subcutaneous port pocket was then created along the upper chest
wall utilizing sharp and blunt dissection. Portable cautery was
utilized. The pocket was irrigated with sterile saline.

A single lumen power injectable port was chosen for placement. The 8
Fr catheter was tunneled from the port pocket site to the venotomy
incision. The port was placed in the pocket. External catheter was
trimmed to appropriate length based on guidewire measurement.

At the venotomy, an 8 Fr peel-away sheath was placed over a
guidewire. The catheter was then placed through the sheath and the
sheath removed. Final catheter positioning was confirmed and
documented with a fluoroscopic spot image. The port was accessed
with a needle and aspirated and flushed with heparinized saline. The
needle was removed.

The venotomy and port pocket incisions were closed with subcutaneous
3-0 Monocryl and subcuticular 4-0 Vicryl. Dermabond was applied to
both incisions.

COMPLICATIONS:
None
FINDINGS: After catheter placement, the tip lies at the cavoatrial junction.
The catheter aspirates normally and is ready for immediate use.
IMPRESSION: Placement of single lumen port a cath via right internal jugular
vein. The catheter tip lies at the cavoatrial junction. A power
injectable port a cath was placed and is ready for immediate use.

## 2015-09-08 IMAGING — CR DG CHEST 2V
2 series · 2 of 2 positions shown · non-contrast
Comparison: CT scan of the chest January 21, 2015 and PA and
lateral chest x-ray January 08, 2015

CLINICAL DATA: History of right pleural effusion, COPD, and right
lung malignancy treated with radiation therapy

EXAM:
CHEST  2 VIEW

[w chest pa]
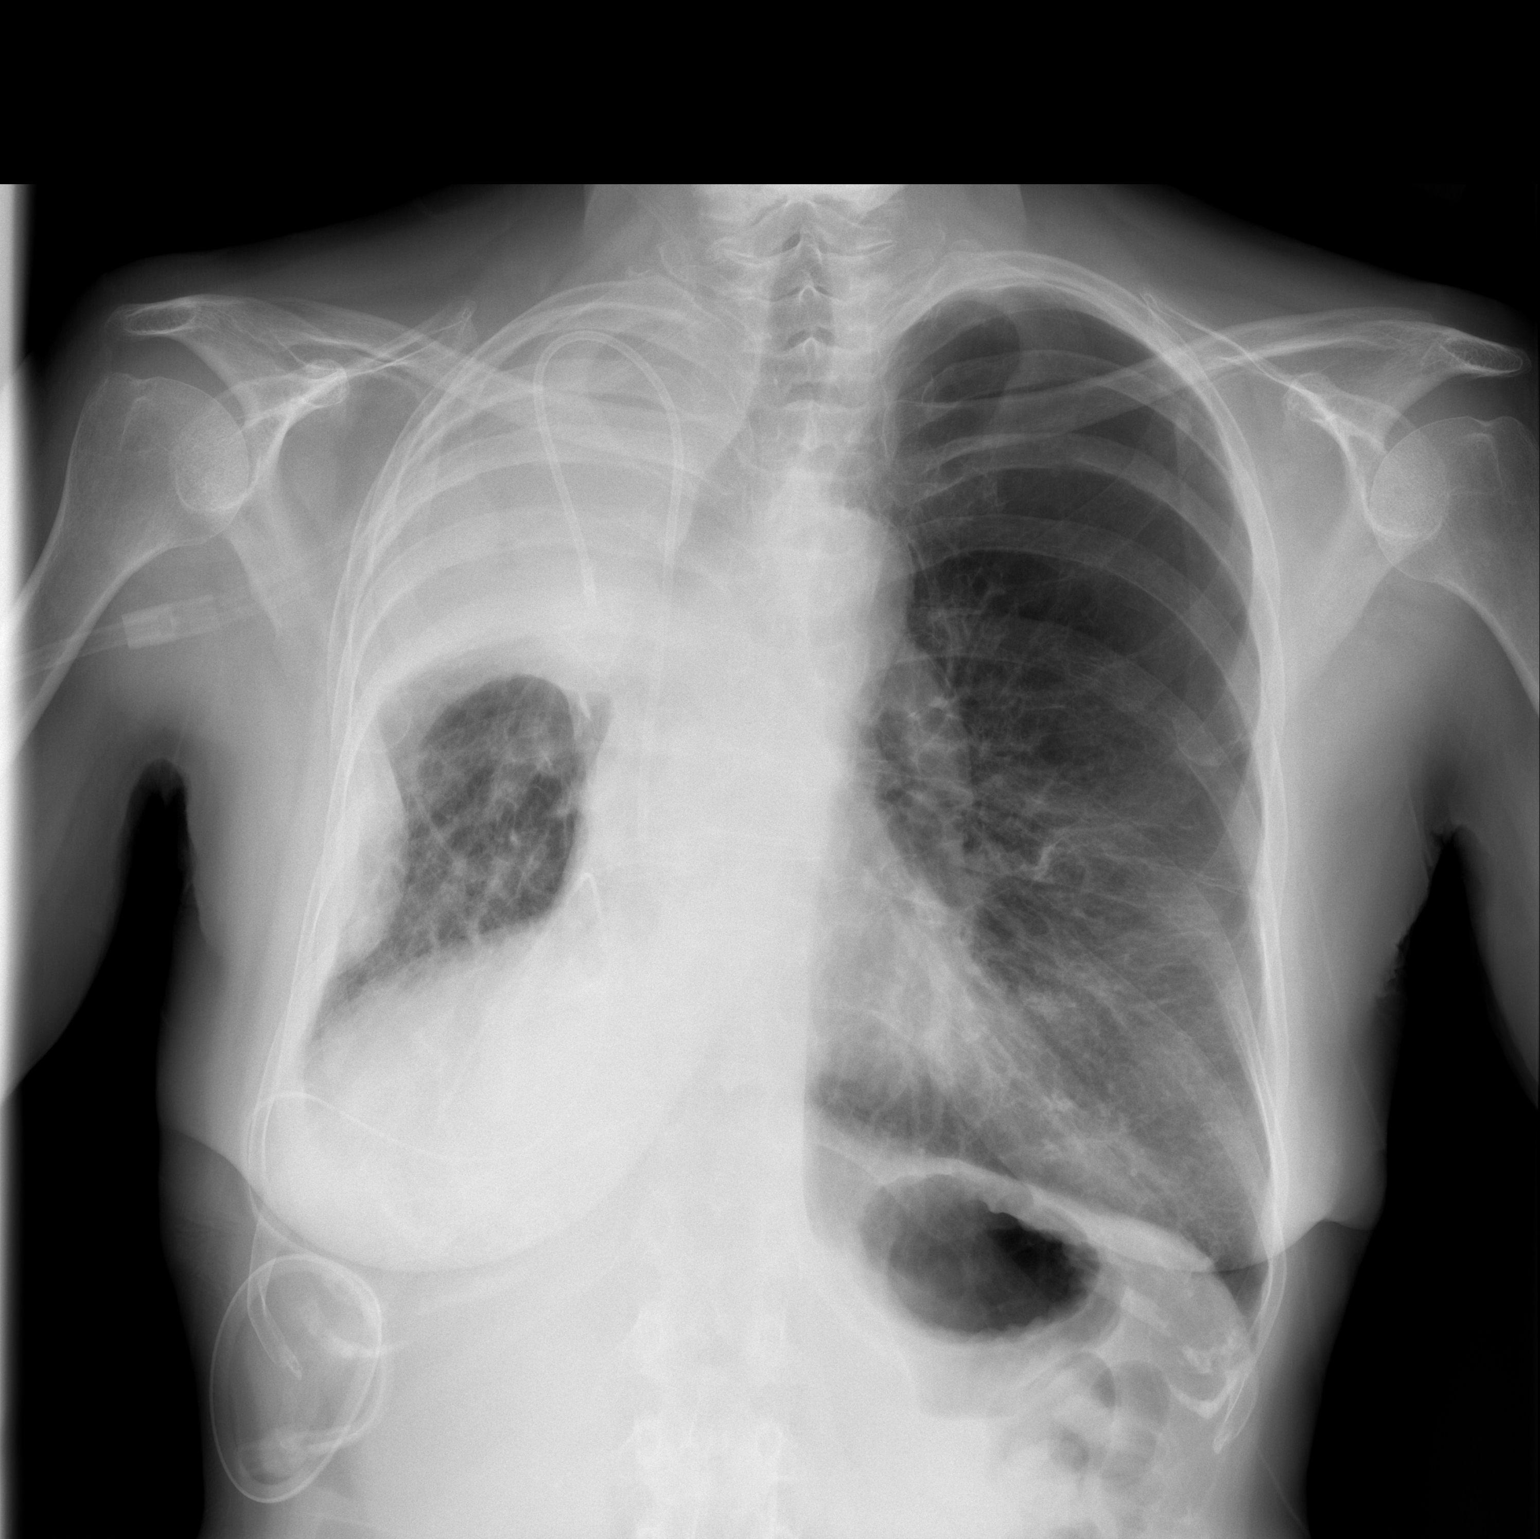

[w chest lat]
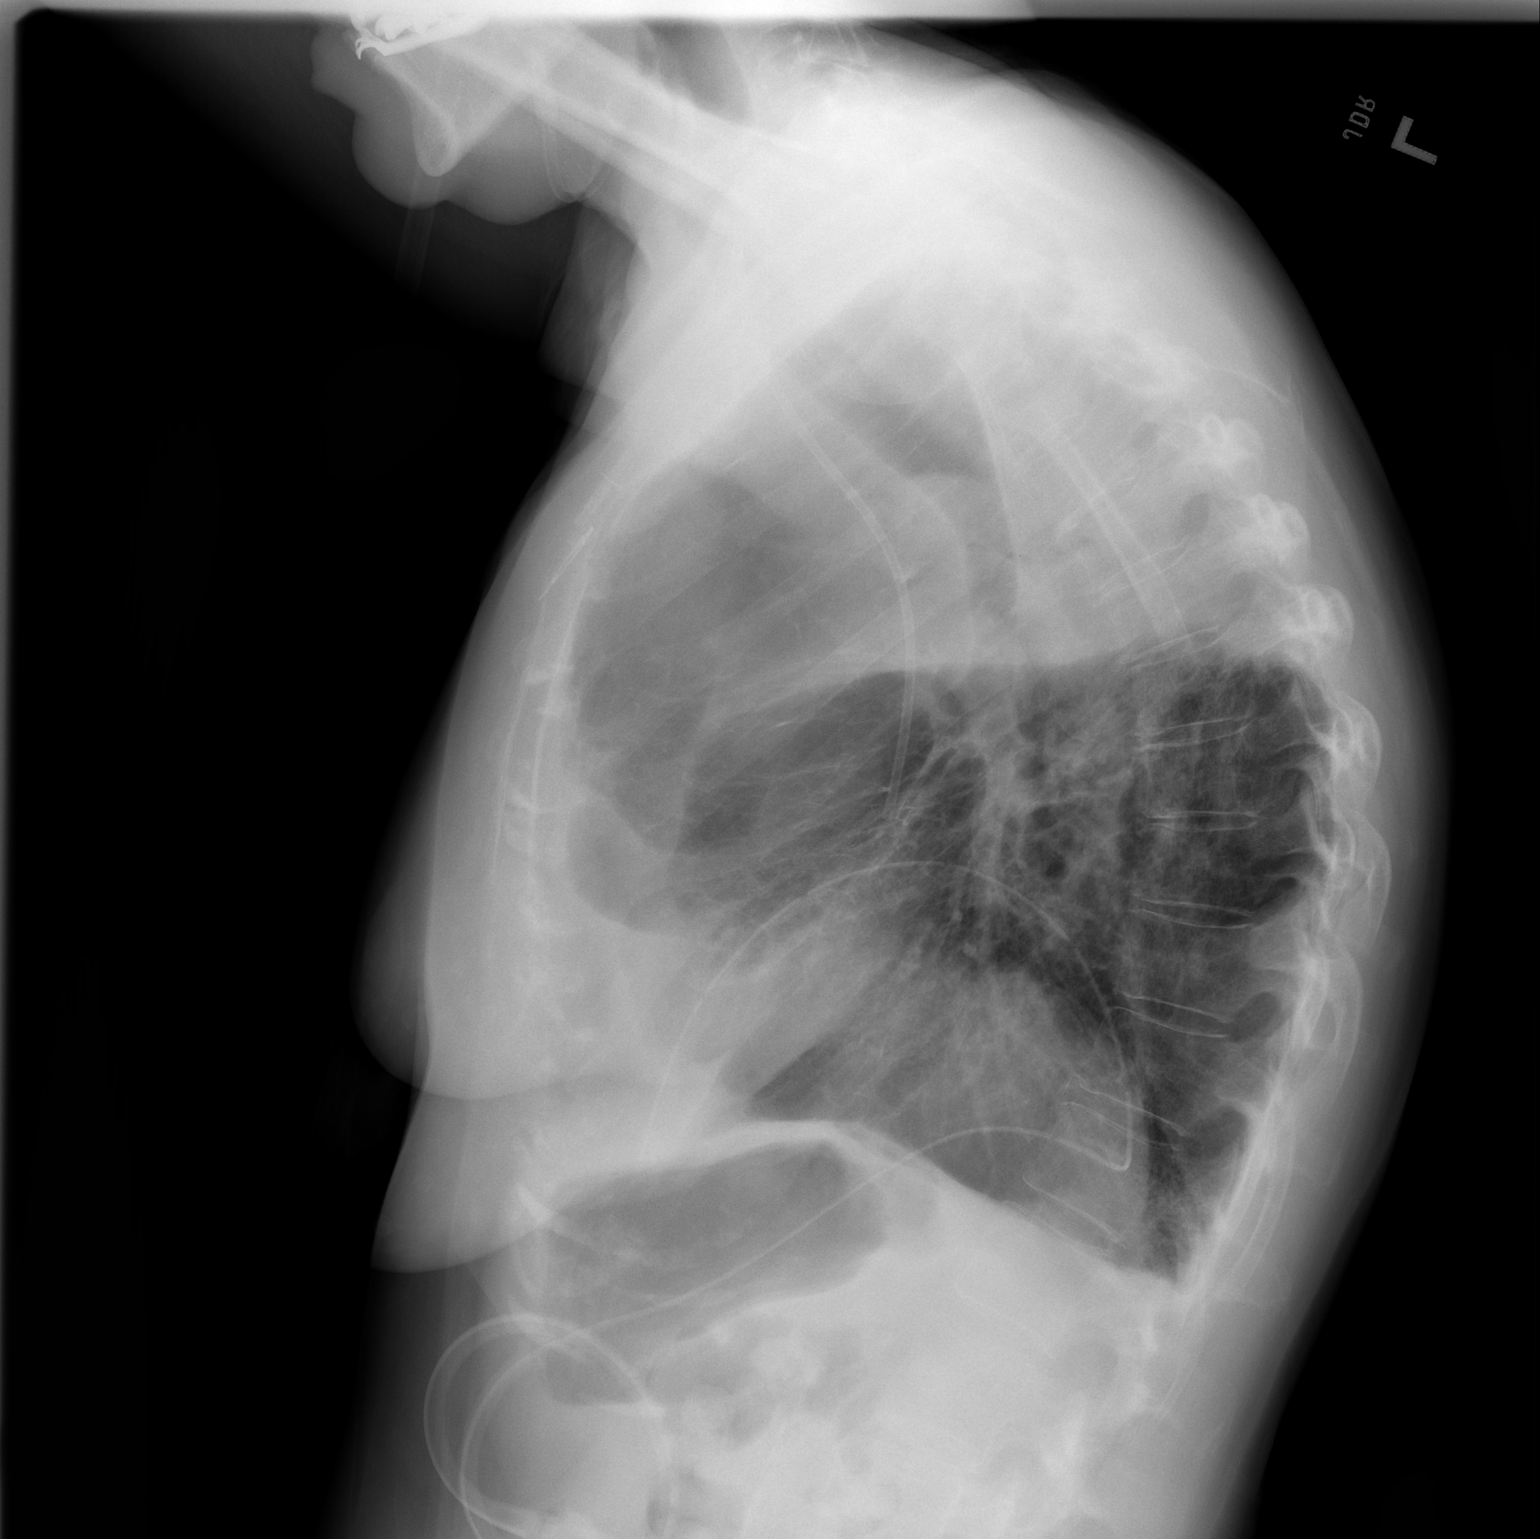

[2 of 2 positions shown; findings below may reference images not displayed]

FINDINGS: Since the earlier studies opacification of the right upper hemi
thorax has become complete. There does remain a small amount of
aerated mid and lower lung. There is shift of the mediastinum toward
the right which chronic. The left lung is well-expanded and clear.
The cardiac silhouette is not enlarged. There is a right pleural the
observed bony thorax exhibits no acute abnormality. Catheter present
whose tip appears to lie in in the anterior costophrenic gutter.
IMPRESSION: Progressive opacification of the right hemithorax superiorly likely
due to postobstructive atelectasis. The volume of pleural fluid does
not appear dramatically changed since the CT scan.
